# Patient Record
Sex: Male | Born: 1935
Health system: Southern US, Community
[De-identification: ages and names within clinical notes are randomized; demographics above are authoritative.]

## PROBLEM LIST (undated history)

## (undated) DIAGNOSIS — E119 Type 2 diabetes mellitus without complications: Secondary | ICD-10-CM

## (undated) DIAGNOSIS — F419 Anxiety disorder, unspecified: Secondary | ICD-10-CM

## (undated) DIAGNOSIS — E041 Nontoxic single thyroid nodule: Secondary | ICD-10-CM

## (undated) DIAGNOSIS — K5730 Diverticulosis of large intestine without perforation or abscess without bleeding: Secondary | ICD-10-CM

## (undated) DIAGNOSIS — G4733 Obstructive sleep apnea (adult) (pediatric): Secondary | ICD-10-CM

## (undated) DIAGNOSIS — I10 Essential (primary) hypertension: Secondary | ICD-10-CM

## (undated) DIAGNOSIS — M1990 Unspecified osteoarthritis, unspecified site: Secondary | ICD-10-CM

## (undated) DIAGNOSIS — E785 Hyperlipidemia, unspecified: Secondary | ICD-10-CM

## (undated) DIAGNOSIS — B159 Hepatitis A without hepatic coma: Secondary | ICD-10-CM

## (undated) DIAGNOSIS — R55 Syncope and collapse: Secondary | ICD-10-CM

## (undated) DIAGNOSIS — K8020 Calculus of gallbladder without cholecystitis without obstruction: Secondary | ICD-10-CM

## (undated) DIAGNOSIS — D649 Anemia, unspecified: Secondary | ICD-10-CM

## (undated) DIAGNOSIS — Z86010 Personal history of colonic polyps: Secondary | ICD-10-CM

## (undated) DIAGNOSIS — D696 Thrombocytopenia, unspecified: Secondary | ICD-10-CM

## (undated) DIAGNOSIS — K222 Esophageal obstruction: Secondary | ICD-10-CM

## (undated) DIAGNOSIS — K219 Gastro-esophageal reflux disease without esophagitis: Secondary | ICD-10-CM

## (undated) DIAGNOSIS — H269 Unspecified cataract: Secondary | ICD-10-CM

## (undated) DIAGNOSIS — H409 Unspecified glaucoma: Secondary | ICD-10-CM

## (undated) DIAGNOSIS — T17908A Unspecified foreign body in respiratory tract, part unspecified causing other injury, initial encounter: Secondary | ICD-10-CM

## (undated) DIAGNOSIS — I498 Other specified cardiac arrhythmias: Secondary | ICD-10-CM

## (undated) DIAGNOSIS — N259 Disorder resulting from impaired renal tubular function, unspecified: Secondary | ICD-10-CM

## (undated) DIAGNOSIS — F329 Major depressive disorder, single episode, unspecified: Secondary | ICD-10-CM

## (undated) DIAGNOSIS — C61 Malignant neoplasm of prostate: Secondary | ICD-10-CM

## (undated) DIAGNOSIS — R413 Other amnesia: Secondary | ICD-10-CM

## (undated) HISTORY — DX: Gastro-esophageal reflux disease without esophagitis: K21.9

## (undated) HISTORY — DX: Type 2 diabetes mellitus without complications: E11.9

## (undated) HISTORY — DX: Anxiety disorder, unspecified: F41.9

## (undated) HISTORY — DX: Personal history of colonic polyps: Z86.010

## (undated) HISTORY — DX: Hepatitis a without hepatic coma: B15.9

## (undated) HISTORY — DX: Unspecified cataract: H26.9

## (undated) HISTORY — DX: Anemia, unspecified: D64.9

## (undated) HISTORY — DX: Disorder resulting from impaired renal tubular function, unspecified: N25.9

## (undated) HISTORY — DX: Malignant neoplasm of prostate: C61

## (undated) HISTORY — DX: Major depressive disorder, single episode, unspecified: F32.9

## (undated) HISTORY — DX: Essential (primary) hypertension: I10

## (undated) HISTORY — DX: Obstructive sleep apnea (adult) (pediatric): G47.33

## (undated) HISTORY — DX: Thrombocytopenia, unspecified: D69.6

## (undated) HISTORY — DX: Other specified cardiac arrhythmias: I49.8

## (undated) HISTORY — DX: Esophageal obstruction: K22.2

## (undated) HISTORY — DX: Hyperlipidemia, unspecified: E78.5

## (undated) HISTORY — DX: Syncope and collapse: R55

## (undated) HISTORY — DX: Unspecified foreign body in respiratory tract, part unspecified causing other injury, initial encounter: T17.908A

## (undated) HISTORY — DX: Unspecified osteoarthritis, unspecified site: M19.90

## (undated) HISTORY — DX: Calculus of gallbladder without cholecystitis without obstruction: K80.20

## (undated) HISTORY — DX: Other amnesia: R41.3

## (undated) HISTORY — DX: Unspecified glaucoma: H40.9

## (undated) HISTORY — DX: Nontoxic single thyroid nodule: E04.1

## (undated) HISTORY — DX: Diverticulosis of large intestine without perforation or abscess without bleeding: K57.30

---
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
## 1961-06-28 DIAGNOSIS — B159 Hepatitis A without hepatic coma: Secondary | ICD-10-CM

## 1961-06-28 HISTORY — DX: Hepatitis a without hepatic coma: B15.9

## 1995-06-29 DIAGNOSIS — C61 Malignant neoplasm of prostate: Secondary | ICD-10-CM

## 1995-06-29 HISTORY — DX: Malignant neoplasm of prostate: C61

## 1995-06-29 HISTORY — PX: PROSTATECTOMY: SHX69

## 1998-06-28 HISTORY — PX: OTHER SURGICAL HISTORY: SHX169

## 2004-05-07 ENCOUNTER — Ambulatory Visit: Payer: Self-pay | Admitting: Internal Medicine

## 2004-11-06 ENCOUNTER — Ambulatory Visit: Payer: Self-pay | Admitting: Endocrinology

## 2004-11-13 ENCOUNTER — Ambulatory Visit: Payer: Self-pay | Admitting: Endocrinology

## 2004-11-24 ENCOUNTER — Ambulatory Visit (HOSPITAL_COMMUNITY): Admission: RE | Admit: 2004-11-24 | Discharge: 2004-11-24 | Payer: Self-pay | Admitting: Endocrinology

## 2005-03-26 ENCOUNTER — Ambulatory Visit: Payer: Self-pay | Admitting: Endocrinology

## 2005-04-01 ENCOUNTER — Ambulatory Visit: Payer: Self-pay | Admitting: Endocrinology

## 2005-04-05 ENCOUNTER — Ambulatory Visit: Payer: Self-pay

## 2005-04-06 ENCOUNTER — Ambulatory Visit: Payer: Self-pay | Admitting: *Deleted

## 2005-04-21 ENCOUNTER — Ambulatory Visit: Payer: Self-pay

## 2005-11-01 ENCOUNTER — Ambulatory Visit: Payer: Self-pay | Admitting: Endocrinology

## 2005-11-02 ENCOUNTER — Ambulatory Visit: Payer: Self-pay | Admitting: *Deleted

## 2005-11-08 ENCOUNTER — Ambulatory Visit: Payer: Self-pay | Admitting: Endocrinology

## 2006-04-05 ENCOUNTER — Ambulatory Visit: Payer: Self-pay | Admitting: Endocrinology

## 2006-04-05 LAB — CONVERTED CEMR LAB
Chloride: 110 meq/L (ref 96–112)
Cholesterol: 84 mg/dL (ref 0–200)
Creatinine, Ser: 1.6 mg/dL — ABNORMAL HIGH (ref 0.4–1.5)
GFR calc non Af Amer: 45 mL/min
Glomerular Filtration Rate, Af Am: 54 mL/min/{1.73_m2}
LDL Cholesterol: 42 mg/dL (ref 0–99)
MCHC: 33.4 g/dL (ref 30.0–36.0)
MCV: 97.4 fL (ref 78.0–100.0)
Sodium: 144 meq/L (ref 135–145)
Triglyceride fasting, serum: 48 mg/dL (ref 0–149)
WBC: 7 10*3/uL (ref 4.5–10.5)

## 2006-04-12 ENCOUNTER — Ambulatory Visit: Payer: Self-pay | Admitting: Endocrinology

## 2006-04-27 ENCOUNTER — Ambulatory Visit: Payer: Self-pay | Admitting: *Deleted

## 2006-11-08 ENCOUNTER — Ambulatory Visit: Payer: Self-pay | Admitting: Endocrinology

## 2006-11-08 LAB — CONVERTED CEMR LAB
AST: 34 units/L (ref 0–37)
BUN: 27 mg/dL — ABNORMAL HIGH (ref 6–23)
Basophils Absolute: 0 10*3/uL (ref 0.0–0.1)
Basophils Relative: 0.1 % (ref 0.0–1.0)
Calcium: 9.2 mg/dL (ref 8.4–10.5)
Cholesterol: 110 mg/dL (ref 0–200)
Creatinine,U: 106.5 mg/dL
Eosinophils Relative: 3.9 % (ref 0.0–5.0)
Hemoglobin: 13.2 g/dL (ref 13.0–17.0)
LDL Cholesterol: 61 mg/dL (ref 0–99)
Lymphocytes Relative: 23.8 % (ref 12.0–46.0)
MCHC: 35 g/dL (ref 30.0–36.0)
Microalb Creat Ratio: 44.1 mg/g — ABNORMAL HIGH (ref 0.0–30.0)
Microalb, Ur: 4.7 mg/dL — ABNORMAL HIGH (ref 0.0–1.9)
Monocytes Relative: 14.8 % — ABNORMAL HIGH (ref 3.0–11.0)
RDW: 13.6 % (ref 11.5–14.6)
Sodium: 144 meq/L (ref 135–145)
Total Bilirubin: 1 mg/dL (ref 0.3–1.2)
Total CHOL/HDL Ratio: 3.2
Total Protein: 6 g/dL (ref 6.0–8.3)
VLDL: 14 mg/dL (ref 0–40)

## 2006-11-15 ENCOUNTER — Ambulatory Visit: Payer: Self-pay | Admitting: Endocrinology

## 2006-11-24 ENCOUNTER — Ambulatory Visit: Payer: Self-pay | Admitting: *Deleted

## 2007-02-04 ENCOUNTER — Encounter: Payer: Self-pay | Admitting: Endocrinology

## 2007-02-04 DIAGNOSIS — Z860100 Personal history of colon polyps, unspecified: Secondary | ICD-10-CM | POA: Insufficient documentation

## 2007-02-04 DIAGNOSIS — M1990 Unspecified osteoarthritis, unspecified site: Secondary | ICD-10-CM

## 2007-02-04 DIAGNOSIS — I10 Essential (primary) hypertension: Secondary | ICD-10-CM

## 2007-02-04 DIAGNOSIS — K5730 Diverticulosis of large intestine without perforation or abscess without bleeding: Secondary | ICD-10-CM

## 2007-02-04 DIAGNOSIS — E119 Type 2 diabetes mellitus without complications: Secondary | ICD-10-CM

## 2007-02-04 DIAGNOSIS — F329 Major depressive disorder, single episode, unspecified: Secondary | ICD-10-CM

## 2007-02-04 DIAGNOSIS — F3289 Other specified depressive episodes: Secondary | ICD-10-CM

## 2007-02-04 HISTORY — DX: Other specified depressive episodes: F32.89

## 2007-02-04 HISTORY — DX: Major depressive disorder, single episode, unspecified: F32.9

## 2007-02-04 HISTORY — DX: Unspecified osteoarthritis, unspecified site: M19.90

## 2007-02-04 HISTORY — DX: Diverticulosis of large intestine without perforation or abscess without bleeding: K57.30

## 2007-02-04 HISTORY — DX: Essential (primary) hypertension: I10

## 2007-02-04 HISTORY — DX: Personal history of colon polyps, unspecified: Z86.0100

## 2007-02-04 HISTORY — DX: Personal history of colonic polyps: Z86.010

## 2007-02-04 HISTORY — DX: Type 2 diabetes mellitus without complications: E11.9

## 2007-03-31 ENCOUNTER — Ambulatory Visit: Payer: Self-pay | Admitting: Cardiovascular Disease

## 2007-04-10 ENCOUNTER — Ambulatory Visit: Payer: Self-pay | Admitting: Endocrinology

## 2007-04-10 LAB — CONVERTED CEMR LAB
Bilirubin, Direct: 0.2 mg/dL (ref 0.0–0.3)
Hgb A1c MFr Bld: 5.8 % (ref 4.6–6.0)
LDL Cholesterol: 61 mg/dL (ref 0–99)
Total Bilirubin: 0.9 mg/dL (ref 0.3–1.2)
Triglycerides: 91 mg/dL (ref 0–149)
VLDL: 18 mg/dL (ref 0–40)

## 2007-04-19 ENCOUNTER — Ambulatory Visit: Payer: Self-pay | Admitting: Endocrinology

## 2007-06-02 ENCOUNTER — Telehealth: Payer: Self-pay | Admitting: Internal Medicine

## 2007-06-26 ENCOUNTER — Encounter: Payer: Self-pay | Admitting: Endocrinology

## 2007-10-13 ENCOUNTER — Telehealth: Payer: Self-pay | Admitting: Endocrinology

## 2007-10-16 ENCOUNTER — Encounter: Payer: Self-pay | Admitting: Endocrinology

## 2007-10-17 ENCOUNTER — Ambulatory Visit: Payer: Self-pay | Admitting: Endocrinology

## 2007-10-20 ENCOUNTER — Encounter: Payer: Self-pay | Admitting: Endocrinology

## 2007-11-02 ENCOUNTER — Ambulatory Visit: Payer: Self-pay | Admitting: Endocrinology

## 2007-11-02 LAB — CONVERTED CEMR LAB
BUN: 32 mg/dL — ABNORMAL HIGH (ref 6–23)
CO2: 30 meq/L (ref 19–32)
Calcium: 9 mg/dL (ref 8.4–10.5)
Chloride: 108 meq/L (ref 96–112)
Creatinine, Ser: 1.4 mg/dL (ref 0.4–1.5)
Eosinophils Absolute: 0.2 10*3/uL (ref 0.0–0.7)
Glucose, Bld: 87 mg/dL (ref 70–99)
Hemoglobin: 12.7 g/dL — ABNORMAL LOW (ref 13.0–17.0)
Lymphocytes Relative: 25.9 % (ref 12.0–46.0)
MCV: 98.1 fL (ref 78.0–100.0)
Monocytes Absolute: 1.2 10*3/uL — ABNORMAL HIGH (ref 0.1–1.0)
Monocytes Relative: 15.5 % — ABNORMAL HIGH (ref 3.0–12.0)
Neutro Abs: 4.5 10*3/uL (ref 1.4–7.7)
RBC: 3.79 M/uL — ABNORMAL LOW (ref 4.22–5.81)
Sodium: 143 meq/L (ref 135–145)

## 2007-11-07 ENCOUNTER — Ambulatory Visit: Payer: Self-pay | Admitting: Endocrinology

## 2007-11-07 DIAGNOSIS — D649 Anemia, unspecified: Secondary | ICD-10-CM

## 2007-11-07 DIAGNOSIS — C61 Malignant neoplasm of prostate: Secondary | ICD-10-CM | POA: Insufficient documentation

## 2007-11-07 HISTORY — DX: Anemia, unspecified: D64.9

## 2007-11-07 LAB — CONVERTED CEMR LAB
Folate: 10.5 ng/mL
Iron: 76 ug/dL (ref 42–165)
Saturation Ratios: 21.7 % (ref 20.0–50.0)
Transferrin: 250.5 mg/dL (ref >=212.0)
Vitamin B-12: 389 pg/mL (ref 211–911)

## 2007-12-04 ENCOUNTER — Ambulatory Visit: Payer: Self-pay | Admitting: Internal Medicine

## 2007-12-04 DIAGNOSIS — E785 Hyperlipidemia, unspecified: Secondary | ICD-10-CM

## 2007-12-04 DIAGNOSIS — T17908A Unspecified foreign body in respiratory tract, part unspecified causing other injury, initial encounter: Secondary | ICD-10-CM | POA: Insufficient documentation

## 2007-12-04 HISTORY — DX: Unspecified foreign body in respiratory tract, part unspecified causing other injury, initial encounter: T17.908A

## 2007-12-04 HISTORY — DX: Hyperlipidemia, unspecified: E78.5

## 2008-02-23 ENCOUNTER — Ambulatory Visit: Payer: Self-pay | Admitting: Cardiovascular Disease

## 2008-03-11 ENCOUNTER — Telehealth: Payer: Self-pay | Admitting: Endocrinology

## 2008-03-18 ENCOUNTER — Ambulatory Visit: Payer: Self-pay | Admitting: Internal Medicine

## 2008-03-27 ENCOUNTER — Ambulatory Visit: Payer: Self-pay | Admitting: Endocrinology

## 2008-04-02 ENCOUNTER — Ambulatory Visit: Payer: Self-pay | Admitting: Internal Medicine

## 2008-04-08 ENCOUNTER — Ambulatory Visit: Payer: Self-pay | Admitting: Endocrinology

## 2008-04-08 LAB — CONVERTED CEMR LAB
Basophils Absolute: 0 10*3/uL (ref 0.0–0.1)
Eosinophils Relative: 3.7 % (ref 0.0–5.0)
Hgb A1c MFr Bld: 5.7 % (ref 4.6–6.0)
Lymphocytes Relative: 29 % (ref 12.0–46.0)
MCV: 96.7 fL (ref 78.0–100.0)
Monocytes Absolute: 1.3 10*3/uL — ABNORMAL HIGH (ref 0.1–1.0)
Monocytes Relative: 16.8 % — ABNORMAL HIGH (ref 3.0–12.0)
Neutrophils Relative %: 50.4 % (ref 43.0–77.0)
Platelets: 97 10*3/uL — ABNORMAL LOW (ref 150–400)
WBC: 8 10*3/uL (ref 4.5–10.5)

## 2008-04-17 ENCOUNTER — Ambulatory Visit: Payer: Self-pay | Admitting: Endocrinology

## 2008-04-17 DIAGNOSIS — D696 Thrombocytopenia, unspecified: Secondary | ICD-10-CM

## 2008-04-17 HISTORY — DX: Thrombocytopenia, unspecified: D69.6

## 2008-04-22 ENCOUNTER — Ambulatory Visit: Payer: Self-pay | Admitting: Hematology

## 2008-04-25 ENCOUNTER — Encounter: Payer: Self-pay | Admitting: Endocrinology

## 2008-04-25 LAB — CBC WITH DIFFERENTIAL/PLATELET
BASO%: 0.2 % (ref 0.0–2.0)
Eosinophils Absolute: 0.2 10*3/uL (ref 0.0–0.5)
HCT: 39 % (ref 38.7–49.9)
LYMPH%: 19 % (ref 14.0–48.0)
MCHC: 34.4 g/dL (ref 32.0–35.9)
MONO#: 1 10*3/uL — ABNORMAL HIGH (ref 0.1–0.9)
NEUT#: 5.6 10*3/uL (ref 1.5–6.5)
NEUT%: 66.2 % (ref 40.0–75.0)
Platelets: 150 10*3/uL (ref 145–400)
WBC: 8.5 10*3/uL (ref 4.0–10.0)
lymph#: 1.6 10*3/uL (ref 0.9–3.3)

## 2008-04-25 LAB — TECHNOLOGIST REVIEW

## 2008-04-29 LAB — SPEP & IFE WITH QIG
Beta 2: 3.9 % (ref 3.2–6.5)
Beta Globulin: 6.8 % (ref 4.7–7.2)
IgA: 89 mg/dL (ref 68–378)
IgG (Immunoglobin G), Serum: 772 mg/dL (ref 694–1618)

## 2008-04-29 LAB — COMPREHENSIVE METABOLIC PANEL
ALT: 19 U/L (ref 0–53)
AST: 31 U/L (ref 0–37)
Albumin: 4.4 g/dL (ref 3.5–5.2)
BUN: 41 mg/dL — ABNORMAL HIGH (ref 6–23)
CO2: 22 mEq/L (ref 19–32)
Calcium: 9.3 mg/dL (ref 8.4–10.5)
Chloride: 107 mEq/L (ref 96–112)
Creatinine, Ser: 1.67 mg/dL — ABNORMAL HIGH (ref 0.40–1.50)
Potassium: 4.3 mEq/L (ref 3.5–5.3)

## 2008-04-29 LAB — KAPPA/LAMBDA LIGHT CHAINS: Kappa free light chain: 0.82 mg/dL (ref 0.33–1.94)

## 2008-05-14 ENCOUNTER — Telehealth (INDEPENDENT_AMBULATORY_CARE_PROVIDER_SITE_OTHER): Payer: Self-pay | Admitting: *Deleted

## 2008-06-11 ENCOUNTER — Telehealth: Payer: Self-pay | Admitting: Endocrinology

## 2008-09-04 ENCOUNTER — Telehealth (INDEPENDENT_AMBULATORY_CARE_PROVIDER_SITE_OTHER): Payer: Self-pay | Admitting: *Deleted

## 2008-10-10 ENCOUNTER — Encounter: Payer: Self-pay | Admitting: Endocrinology

## 2008-11-04 ENCOUNTER — Ambulatory Visit: Payer: Self-pay | Admitting: Oncology

## 2008-11-05 ENCOUNTER — Ambulatory Visit: Payer: Self-pay | Admitting: Internal Medicine

## 2008-11-05 LAB — CONVERTED CEMR LAB
BUN: 40 mg/dL — ABNORMAL HIGH (ref 6–23)
Basophils Absolute: 0 10*3/uL (ref 0.0–0.1)
Chloride: 108 meq/L (ref 96–112)
Cholesterol: 128 mg/dL (ref 0–200)
Creatinine, Ser: 1.5 mg/dL (ref 0.4–1.5)
Eosinophils Absolute: 0.4 10*3/uL (ref 0.0–0.7)
GFR calc non Af Amer: 47.97 mL/min (ref >60)
Glucose, Bld: 82 mg/dL (ref 70–99)
HCT: 38.4 % — ABNORMAL LOW (ref 39.0–52.0)
HDL: 36 mg/dL — ABNORMAL LOW (ref >39.00)
Hgb A1c MFr Bld: 5.9 % (ref 4.6–6.5)
LDL Cholesterol: 73 mg/dL (ref 0–99)
Lymphs Abs: 2.2 10*3/uL (ref 0.7–4.0)
MCHC: 34.5 g/dL (ref 30.0–36.0)
Microalb Creat Ratio: 62.6 mg/g — ABNORMAL HIGH (ref 0.0–30.0)
Microalb, Ur: 6.7 mg/dL — ABNORMAL HIGH (ref 0.0–1.9)
Neutro Abs: 5 10*3/uL (ref 1.4–7.7)
Nitrite: NEGATIVE
Platelets: 110 10*3/uL — ABNORMAL LOW (ref 150.0–400.0)
RBC: 3.95 M/uL — ABNORMAL LOW (ref 4.22–5.81)
RDW: 13.2 % (ref 11.5–14.6)
Total Protein, Urine: NEGATIVE mg/dL
Triglycerides: 95 mg/dL (ref 0.0–149.0)
VLDL: 19 mg/dL (ref 0.0–40.0)
WBC: 8.8 10*3/uL (ref 4.5–10.5)

## 2008-11-06 ENCOUNTER — Encounter: Payer: Self-pay | Admitting: Endocrinology

## 2008-11-06 LAB — CBC WITH DIFFERENTIAL/PLATELET
Basophils Absolute: 0 10*3/uL (ref 0.0–0.1)
Eosinophils Absolute: 0.3 10*3/uL (ref 0.0–0.5)
HGB: 13 g/dL (ref 13.0–17.1)
LYMPH%: 25.5 % (ref 14.0–49.0)
MCH: 33.3 pg (ref 27.2–33.4)
MCV: 97.2 fL (ref 79.3–98.0)
MONO%: 14 % (ref 0.0–14.0)
NEUT#: 4.2 10*3/uL (ref 1.5–6.5)
NEUT%: 56.6 % (ref 39.0–75.0)
Platelets: 120 10*3/uL — ABNORMAL LOW (ref 140–400)

## 2008-11-06 LAB — COMPREHENSIVE METABOLIC PANEL
ALT: 17 U/L (ref 0–53)
BUN: 35 mg/dL — ABNORMAL HIGH (ref 6–23)
CO2: 24 mEq/L (ref 19–32)
Calcium: 9.2 mg/dL (ref 8.4–10.5)
Chloride: 107 mEq/L (ref 96–112)
Creatinine, Ser: 1.57 mg/dL — ABNORMAL HIGH (ref 0.40–1.50)
Glucose, Bld: 124 mg/dL — ABNORMAL HIGH (ref 70–99)
Total Bilirubin: 0.6 mg/dL (ref 0.3–1.2)

## 2008-11-06 LAB — CHCC SMEAR

## 2008-11-06 LAB — MORPHOLOGY

## 2008-11-13 ENCOUNTER — Ambulatory Visit: Payer: Self-pay | Admitting: Endocrinology

## 2008-11-13 DIAGNOSIS — E041 Nontoxic single thyroid nodule: Secondary | ICD-10-CM

## 2008-11-13 DIAGNOSIS — I498 Other specified cardiac arrhythmias: Secondary | ICD-10-CM

## 2008-11-13 DIAGNOSIS — R001 Bradycardia, unspecified: Secondary | ICD-10-CM | POA: Insufficient documentation

## 2008-11-13 DIAGNOSIS — N259 Disorder resulting from impaired renal tubular function, unspecified: Secondary | ICD-10-CM | POA: Insufficient documentation

## 2008-11-13 HISTORY — DX: Disorder resulting from impaired renal tubular function, unspecified: N25.9

## 2008-11-13 HISTORY — DX: Other specified cardiac arrhythmias: I49.8

## 2008-11-13 HISTORY — DX: Nontoxic single thyroid nodule: E04.1

## 2008-12-10 ENCOUNTER — Encounter (INDEPENDENT_AMBULATORY_CARE_PROVIDER_SITE_OTHER): Payer: Self-pay | Admitting: Diagnostic Radiology

## 2008-12-10 ENCOUNTER — Encounter: Payer: Self-pay | Admitting: Endocrinology

## 2008-12-10 ENCOUNTER — Other Ambulatory Visit: Admission: RE | Admit: 2008-12-10 | Discharge: 2008-12-10 | Payer: Self-pay | Admitting: Diagnostic Radiology

## 2008-12-10 ENCOUNTER — Encounter: Admission: RE | Admit: 2008-12-10 | Discharge: 2008-12-10 | Payer: Self-pay | Admitting: Endocrinology

## 2008-12-13 ENCOUNTER — Telehealth: Payer: Self-pay | Admitting: Internal Medicine

## 2009-03-08 DIAGNOSIS — H409 Unspecified glaucoma: Secondary | ICD-10-CM | POA: Insufficient documentation

## 2009-03-08 HISTORY — DX: Unspecified glaucoma: H40.9

## 2009-03-24 ENCOUNTER — Ambulatory Visit: Payer: Self-pay | Admitting: Cardiovascular Disease

## 2009-04-08 ENCOUNTER — Ambulatory Visit: Payer: Self-pay | Admitting: Oncology

## 2009-04-08 ENCOUNTER — Ambulatory Visit: Payer: Self-pay | Admitting: Endocrinology

## 2009-04-09 LAB — CONVERTED CEMR LAB
Eosinophils Absolute: 0.3 10*3/uL (ref 0.0–0.7)
Eosinophils Relative: 3.6 % (ref 0.0–5.0)
HCT: 40.5 % (ref 39.0–52.0)
Hemoglobin: 13.8 g/dL (ref 13.0–17.0)
MCHC: 33.9 g/dL (ref 30.0–36.0)
Monocytes Relative: 16 % — ABNORMAL HIGH (ref 3.0–12.0)
Neutro Abs: 4.5 10*3/uL (ref 1.4–7.7)
TSH: 3.64 microintl units/mL (ref 0.35–5.50)

## 2009-04-10 ENCOUNTER — Encounter: Payer: Self-pay | Admitting: Endocrinology

## 2009-04-10 LAB — CBC WITH DIFFERENTIAL/PLATELET
Basophils Absolute: 0 10*3/uL (ref 0.0–0.1)
HCT: 39.8 % (ref 38.4–49.9)
HGB: 13.5 g/dL (ref 13.0–17.1)
LYMPH%: 24.8 % (ref 14.0–49.0)
MCH: 32.9 pg (ref 27.2–33.4)
MONO#: 0.9 10*3/uL (ref 0.1–0.9)
NEUT%: 62.1 % (ref 39.0–75.0)
Platelets: 108 10*3/uL — ABNORMAL LOW (ref 140–400)
WBC: 9.1 10*3/uL (ref 4.0–10.3)
lymph#: 2.3 10*3/uL (ref 0.9–3.3)

## 2009-04-15 ENCOUNTER — Ambulatory Visit: Payer: Self-pay | Admitting: Endocrinology

## 2009-11-03 ENCOUNTER — Ambulatory Visit: Payer: Self-pay | Admitting: Endocrinology

## 2009-11-03 LAB — CONVERTED CEMR LAB
ALT: 22 units/L (ref 0–53)
AST: 34 units/L (ref 0–37)
Albumin: 4 g/dL (ref 3.5–5.2)
Bilirubin Urine: NEGATIVE
Bilirubin, Direct: 0.1 mg/dL (ref 0.0–0.3)
Cholesterol: 137 mg/dL (ref 0–200)
Eosinophils Relative: 2.9 % (ref 0.0–5.0)
HCT: 38.5 % — ABNORMAL LOW (ref 39.0–52.0)
Hemoglobin, Urine: NEGATIVE
Ketones, ur: NEGATIVE mg/dL
Leukocytes, UA: NEGATIVE
MCHC: 34.6 g/dL (ref 30.0–36.0)
MCV: 97.5 fL (ref 78.0–100.0)
Microalb Creat Ratio: 7.5 mg/g (ref 0.0–30.0)
Monocytes Relative: 13 % — ABNORMAL HIGH (ref 3.0–12.0)
Neutro Abs: 5.2 10*3/uL (ref 1.4–7.7)
Neutrophils Relative %: 57 % (ref 43.0–77.0)
Platelets: 112 10*3/uL — ABNORMAL LOW (ref 150.0–400.0)
RBC: 3.94 M/uL — ABNORMAL LOW (ref 4.22–5.81)
RDW: 14.3 % (ref 11.5–14.6)
Specific Gravity, Urine: 1.02 (ref 1.000–1.030)
Total Bilirubin: 0.6 mg/dL (ref 0.3–1.2)
Total CHOL/HDL Ratio: 3
Total Protein: 6.4 g/dL (ref 6.0–8.3)
VLDL: 22.2 mg/dL (ref 0.0–40.0)
WBC: 9.1 10*3/uL (ref 4.5–10.5)
pH: 5.5 (ref 5.0–8.0)

## 2009-11-05 ENCOUNTER — Ambulatory Visit: Payer: Self-pay | Admitting: Endocrinology

## 2009-11-05 DIAGNOSIS — G4733 Obstructive sleep apnea (adult) (pediatric): Secondary | ICD-10-CM

## 2009-11-05 HISTORY — DX: Obstructive sleep apnea (adult) (pediatric): G47.33

## 2009-11-10 ENCOUNTER — Ambulatory Visit: Payer: Self-pay | Admitting: Oncology

## 2009-11-12 ENCOUNTER — Encounter: Payer: Self-pay | Admitting: Endocrinology

## 2009-11-12 LAB — CBC WITH DIFFERENTIAL/PLATELET
BASO%: 0.3 % (ref 0.0–2.0)
HCT: 39.5 % (ref 38.4–49.9)
HGB: 13.6 g/dL (ref 13.0–17.1)
LYMPH%: 28.5 % (ref 14.0–49.0)
MCV: 96.9 fL (ref 79.3–98.0)
MONO#: 1.3 10*3/uL — ABNORMAL HIGH (ref 0.1–0.9)
MONO%: 13.3 % (ref 0.0–14.0)
NEUT#: 5.1 10*3/uL (ref 1.5–6.5)
NEUT%: 54.3 % (ref 39.0–75.0)
RDW: 13.9 % (ref 11.0–14.6)

## 2009-11-12 LAB — COMPREHENSIVE METABOLIC PANEL
AST: 37 U/L (ref 0–37)
Alkaline Phosphatase: 58 U/L (ref 39–117)
CO2: 22 mEq/L (ref 19–32)
Calcium: 9.3 mg/dL (ref 8.4–10.5)
Chloride: 106 mEq/L (ref 96–112)
Glucose, Bld: 92 mg/dL (ref 70–99)
Potassium: 5.1 mEq/L (ref 3.5–5.3)
Total Protein: 6.4 g/dL (ref 6.0–8.3)

## 2009-11-21 ENCOUNTER — Ambulatory Visit: Payer: Self-pay | Admitting: Pulmonary Disease

## 2009-11-21 ENCOUNTER — Telehealth: Payer: Self-pay | Admitting: Endocrinology

## 2009-11-25 ENCOUNTER — Encounter: Admission: RE | Admit: 2009-11-25 | Discharge: 2009-11-25 | Payer: Self-pay | Admitting: Endocrinology

## 2009-12-31 ENCOUNTER — Telehealth (INDEPENDENT_AMBULATORY_CARE_PROVIDER_SITE_OTHER): Payer: Self-pay | Admitting: *Deleted

## 2009-12-31 ENCOUNTER — Ambulatory Visit: Payer: Self-pay | Admitting: Endocrinology

## 2009-12-31 ENCOUNTER — Telehealth: Payer: Self-pay | Admitting: Endocrinology

## 2009-12-31 ENCOUNTER — Telehealth: Payer: Self-pay | Admitting: Cardiovascular Disease

## 2009-12-31 DIAGNOSIS — R55 Syncope and collapse: Secondary | ICD-10-CM

## 2009-12-31 DIAGNOSIS — R0989 Other specified symptoms and signs involving the circulatory and respiratory systems: Secondary | ICD-10-CM

## 2009-12-31 HISTORY — DX: Syncope and collapse: R55

## 2010-01-01 ENCOUNTER — Ambulatory Visit: Payer: Self-pay

## 2010-01-01 ENCOUNTER — Encounter: Payer: Self-pay | Admitting: Endocrinology

## 2010-01-01 ENCOUNTER — Encounter: Payer: Self-pay | Admitting: Cardiology

## 2010-01-05 ENCOUNTER — Encounter: Payer: Self-pay | Admitting: Pulmonary Disease

## 2010-01-05 ENCOUNTER — Ambulatory Visit (HOSPITAL_BASED_OUTPATIENT_CLINIC_OR_DEPARTMENT_OTHER): Admission: RE | Admit: 2010-01-05 | Discharge: 2010-01-05 | Payer: Self-pay | Admitting: Pulmonary Disease

## 2010-01-09 ENCOUNTER — Telehealth: Payer: Self-pay | Admitting: Endocrinology

## 2010-01-09 ENCOUNTER — Ambulatory Visit: Payer: Self-pay | Admitting: Pulmonary Disease

## 2010-01-09 ENCOUNTER — Ambulatory Visit: Payer: Self-pay

## 2010-01-09 ENCOUNTER — Encounter: Payer: Self-pay | Admitting: Endocrinology

## 2010-01-26 ENCOUNTER — Ambulatory Visit: Payer: Self-pay | Admitting: Cardiovascular Disease

## 2010-02-17 ENCOUNTER — Ambulatory Visit: Payer: Self-pay | Admitting: Pulmonary Disease

## 2010-02-17 DIAGNOSIS — R252 Cramp and spasm: Secondary | ICD-10-CM | POA: Insufficient documentation

## 2010-02-26 ENCOUNTER — Encounter: Admission: RE | Admit: 2010-02-26 | Discharge: 2010-02-26 | Payer: Self-pay | Admitting: Sports Medicine

## 2010-03-05 ENCOUNTER — Ambulatory Visit: Payer: Self-pay | Admitting: Sports Medicine

## 2010-03-05 DIAGNOSIS — M169 Osteoarthritis of hip, unspecified: Secondary | ICD-10-CM

## 2010-03-05 DIAGNOSIS — M2170 Unequal limb length (acquired), unspecified site: Secondary | ICD-10-CM

## 2010-03-05 DIAGNOSIS — M25559 Pain in unspecified hip: Secondary | ICD-10-CM | POA: Insufficient documentation

## 2010-03-19 ENCOUNTER — Ambulatory Visit: Payer: Self-pay | Admitting: Sports Medicine

## 2010-03-20 ENCOUNTER — Ambulatory Visit: Payer: Self-pay | Admitting: Endocrinology

## 2010-03-31 ENCOUNTER — Ambulatory Visit: Payer: Self-pay | Admitting: Endocrinology

## 2010-03-31 LAB — CONVERTED CEMR LAB
Basophils Relative: 0.2 % (ref 0.0–3.0)
Glucose, Bld: 84 mg/dL (ref 70–99)
HCT: 33.7 % — ABNORMAL LOW (ref 39.0–52.0)
Hgb A1c MFr Bld: 6.3 % (ref 4.6–6.5)
Lymphs Abs: 2.5 10*3/uL (ref 0.7–4.0)
MCHC: 34.5 g/dL (ref 30.0–36.0)
Monocytes Absolute: 1.3 10*3/uL — ABNORMAL HIGH (ref 0.1–1.0)
Monocytes Relative: 14.3 % — ABNORMAL HIGH (ref 3.0–12.0)
Neutro Abs: 5.2 10*3/uL (ref 1.4–7.7)
Platelets: 157 10*3/uL (ref 150.0–400.0)
Potassium: 4.8 meq/L (ref 3.5–5.1)
Sodium: 143 meq/L (ref 135–145)
WBC: 9.3 10*3/uL (ref 4.5–10.5)

## 2010-04-06 ENCOUNTER — Ambulatory Visit: Payer: Self-pay | Admitting: Oncology

## 2010-04-06 ENCOUNTER — Ambulatory Visit: Payer: Self-pay | Admitting: Sports Medicine

## 2010-04-06 ENCOUNTER — Telehealth (INDEPENDENT_AMBULATORY_CARE_PROVIDER_SITE_OTHER): Payer: Self-pay | Admitting: *Deleted

## 2010-04-08 ENCOUNTER — Ambulatory Visit: Payer: Self-pay | Admitting: Endocrinology

## 2010-04-08 DIAGNOSIS — R600 Localized edema: Secondary | ICD-10-CM | POA: Insufficient documentation

## 2010-04-08 DIAGNOSIS — R609 Edema, unspecified: Secondary | ICD-10-CM

## 2010-04-08 LAB — CBC WITH DIFFERENTIAL/PLATELET
BASO%: 0.3 % (ref 0.0–2.0)
Basophils Absolute: 0 10*3/uL (ref 0.0–0.1)
EOS%: 3.2 % (ref 0.0–7.0)
HGB: 11.8 g/dL — ABNORMAL LOW (ref 13.0–17.1)
LYMPH%: 23.3 % (ref 14.0–49.0)
MONO#: 1.1 10*3/uL — ABNORMAL HIGH (ref 0.1–0.9)
MONO%: 12.2 % (ref 0.0–14.0)
NEUT#: 5.3 10*3/uL (ref 1.5–6.5)
Platelets: 157 10*3/uL (ref 140–400)
RDW: 13.8 % (ref 11.0–14.6)
WBC: 8.6 10*3/uL (ref 4.0–10.3)
lymph#: 2 10*3/uL (ref 0.9–3.3)

## 2010-04-08 LAB — COMPREHENSIVE METABOLIC PANEL
AST: 24 U/L (ref 0–37)
Albumin: 4.1 g/dL (ref 3.5–5.2)
CO2: 26 mEq/L (ref 19–32)
Chloride: 106 mEq/L (ref 96–112)
Total Protein: 6.7 g/dL (ref 6.0–8.3)

## 2010-04-09 ENCOUNTER — Telehealth: Payer: Self-pay | Admitting: Endocrinology

## 2010-04-10 LAB — KAPPA/LAMBDA LIGHT CHAINS: Kappa free light chain: <0.6 mg/dL (ref 0.33–1.94)

## 2010-04-10 LAB — VITAMIN B12: Vitamin B-12: 623 pg/mL (ref 211–911)

## 2010-04-10 LAB — FOLATE: Folate: 19.4 ng/mL

## 2010-04-10 LAB — HAPTOGLOBIN: Haptoglobin: 209 mg/dL — ABNORMAL HIGH (ref 16–200)

## 2010-04-10 LAB — IRON AND TIBC
%SAT: 20 % (ref 20–55)
Iron: 61 ug/dL (ref 42–165)
UIBC: 238 ug/dL

## 2010-04-10 LAB — PROTEIN ELECTROPHORESIS, SERUM: Total Protein, Serum Electrophoresis: 6.9 g/dL (ref 6.0–8.3)

## 2010-04-10 LAB — ERYTHROPOIETIN: Erythropoietin: 15.1 m[IU]/mL (ref 2.6–34.0)

## 2010-06-17 ENCOUNTER — Encounter: Payer: Self-pay | Admitting: Endocrinology

## 2010-06-17 ENCOUNTER — Telehealth: Payer: Self-pay | Admitting: Endocrinology

## 2010-06-26 ENCOUNTER — Encounter (INDEPENDENT_AMBULATORY_CARE_PROVIDER_SITE_OTHER): Payer: Self-pay | Admitting: *Deleted

## 2010-07-26 LAB — CONVERTED CEMR LAB
BUN: 35 mg/dL — ABNORMAL HIGH (ref 6–23)
Basophils Absolute: 0 10*3/uL (ref 0.0–0.1)
Creatinine, Ser: 1.3 mg/dL (ref 0.4–1.5)
GFR calc non Af Amer: 54.47 mL/min (ref >60)
HCT: 38.4 % — ABNORMAL LOW (ref 39.0–52.0)
Hemoglobin: 13.1 g/dL (ref 13.0–17.0)
Monocytes Relative: 12.7 % — ABNORMAL HIGH (ref 3.0–12.0)
Neutro Abs: 5 10*3/uL (ref 1.4–7.7)
Potassium: 4.3 meq/L (ref 3.5–5.1)
RBC: 3.88 M/uL — ABNORMAL LOW (ref 4.22–5.81)
Sodium: 143 meq/L (ref 135–145)
WBC: 8.5 10*3/uL (ref 4.5–10.5)

## 2010-07-30 NOTE — Miscellaneous (Signed)
Summary: Orders Update  Clinical Lists Changes  Orders: Added new Test order of Carotid Duplex (Carotid Duplex) - Signed  Appended Document: Orders Update please leave message on phone tree--normal  Appended Document: Orders Update left message on phone tree/AJ

## 2010-07-30 NOTE — Miscellaneous (Signed)
Summary: Polysomnogram report  Clinical Lists Changes AHI 3.5, RDI 11, O2 nadir 91%, PLMI 56.  Mild sleep apnea with periodic limb movement syndrome.  Could try positional therapy first.  Will have my nurse call to schedule next available ROV to discuss sleep test results.  Appended Document: Polysomnogram report LMOMTCB.  Appended Document: Polysomnogram report LMOMTCB.  Appended Document: Polysomnogram report LMOMTCB.  Appended Document: Polysomnogram report LMOMTCB.  Appended Document: Polysomnogram report Per Spouse, we will need to contact the patient on Mon., 01/26/2010, in the afternoon, to sch an appt.  Appended Document: Polysomnogram report Patient sch for 02/17/2010 @ 2:45pm. Several appt dates and times were offered to the patient and spouse but this was the first appt date that worked for both of them.

## 2010-07-30 NOTE — Letter (Signed)
Summary: Letter regarding heart cath/Patient  Letter regarding heart cath/Patient   Imported By: Phillis Knack 07/03/2010 08:59:00  _____________________________________________________________________  External Attachment:    Type:   Image     Comment:   External Document

## 2010-07-30 NOTE — Progress Notes (Signed)
Summary: [place]?  Phone Note Call from Patient Call back at Home Phone [phone]   Caller: Patient Summary of Call: pt called stating that he was advised by MD in [place] that he should have had an [place] after having biopsy. Pt is requesting SAE opinion. Please advise Initial call taken by: Crissie Sickles, [place],  Nov 21, 2009 2:31 PM  Follow-up for Phone Call        i spoke to pt.  i ordered ultrasound.  you will be called with a day and time for an appointment Follow-up by: Donavan Foil MD,  Nov 21, 2009 2:46 PM

## 2010-07-30 NOTE — Progress Notes (Signed)
Summary: forms  Phone Note Outgoing Call   Call placed by: [name] MA,  April 09, 2010 12:00 PM Call placed to: Patient Summary of Call: Informed pt that forms are ready for pickup at front desk

## 2010-07-30 NOTE — Progress Notes (Signed)
Summary: Pt?  Phone Note Call from Patient Call back at Home Phone [phone]   Caller: Spouse Summary of Call: Pt's spouse called wanting to know if MD thinks pt should still have sleep study done. He is scheduled to have it done Monday night 07/18. Pt's wife is concerned that this may interfere with his current medical issue that he was senn for today. Please advise? Initial call taken by: Crissie Sickles, [place],  December 31, 2009 10:24 AM  Follow-up for Phone Call        please go ahead and do the sleep study Follow-up by: Donavan Foil MD,  December 31, 2009 10:35 AM  Additional Follow-up for Phone Call Additional follow up Details #1::        pt's spouse informed Additional Follow-up by: Crissie Sickles, [place],  December 31, 2009 10:39 AM

## 2010-07-30 NOTE — Assessment & Plan Note (Signed)
Summary: LOST CONSCIOUSNESS WHILE PLAYING TENNIS./DRINKING FLUID-PER W...   Vital Signs:  Patient profile:   77 year old male Height:      72 inches (182.88 cm) Weight:      209.25 pounds (95.11 kg) BMI:     28.48 O2 Sat:      97 % on Room air Temp:     97.7 degrees F (36.50 degrees C) oral Pulse rate:   64 / minute Pulse rhythm:   regular BP sitting:   110 / 62  (left arm) Cuff size:   regular  Vitals Entered By: [name] MA (December 31, 2009 8:12 AM)  O2 Flow:  Room air CC: pt loss conciousness after drinking water while playing tennis/aj   Referring Provider:  Dr. Loanne Drilling Primary Provider:  Donavan Foil MD  CC:  pt loss conciousness after drinking water while playing tennis/aj.  History of Present Illness: 3 days ago, pt was playing tennis on a hot day.  during a break, he was drinking water when he "choking" sensation in the throat.  he thinks he lost consciousness for a few seconds.  family members caught him before he hit the ground.  he then recovered back to baseline.  no pain in the chest, but he had associated slight sob.   about 4 years ago, he was drinking coffee, and had a similar episode.    Current Medications (verified): 1)  Lisinopril 40 Mg  Tabs (Lisinopril) .... Take 1 By Mouth Qd 2)  Citalopram Hydrobromide 20 Mg  Tabs (Citalopram Hydrobromide) .... Take 1/2 Tab Qod 3)  Crestor 40 Mg  Tabs (Rosuvastatin Calcium) .... Take 1 By Mouth Qd 4)  Actos 45 Mg  Tabs (Pioglitazone Hcl) .... Take 1/2 Tab By Mouth Qd 5)  Vitamin D 2000 Unit Tabs (Cholecalciferol) .Marland Kitchen.. 1 By Mouth Daily 6)  Aspirin 81 Mg Tbec (Aspirin) .... Take One Tablet By Mouth Daily 7)  Cyclobenzaprine Hcl 10 Mg Tabs (Cyclobenzaprine Hcl) .Marland Kitchen.. 1 By Mouth Daily As Needed For Leg Cramps 8)  Vitamin B-1 100 Mg Tabs (Thiamine Hcl) .Marland Kitchen.. 1 By Mouth Daily  Allergies (verified): 1)  ! Sulfa  Past History:  Past Medical History: Last updated: 03/08/2009 Current Problems:  HYPERTENSION  (ICD-401.9) HYPERLIPIDEMIA (ICD-272.4) BRADYCARDIA (ICD-427.89) RENAL INSUFFICIENCY (ICD-588.9) THROMBOCYTOPENIA (ICD-287.5) THYROID NODULE, RIGHT (ICD-241.0) FOREIGN BODY, ASPIRATION (ICD-934.9) PROSTATE CANCER (ICD-185) UNSPECIFIED ANEMIA (ICD-285.9) OSTEOARTHRITIS (ICD-715.90) DIVERTICULOSIS, COLON (ICD-562.10) DIABETES MELLITUS, TYPE II (ICD-250.00) DEPRESSION (ICD-311) COLONIC POLYPS, HX OF (ICD-V12.72) GLAUCOMA (ICD-365.9)  Review of Systems  The patient denies fever.         denies n/v  Physical Exam  General:  normal appearance.   Head:  head: no deformity eyes: no periorbital swelling, no proptosis external nose and ears are normal mouth: no lesion seen Neck:  Supple without thyroid enlargement or tenderness.  Lungs:  Clear to auscultation bilaterally. Normal respiratory effort.  Heart:  Regular rate and rhythm without murmurs or gallops noted. Normal S1,S2.   Abdomen:  abdomen is soft, nontender.  no hepatosplenomegaly.   not distended.  no hernia  Msk:  muscle bulk and strength are grossly normal.  no obvious joint swelling.  gait is normal and steady  Pulses:  there is a left carotid bruit Extremities:  no edema Neurologic:  cn 2-12 grossly intact.   readily moves all 4's.    Skin:  not diaphoretic Psych:  Alert and cooperative; normal mood and affect; normal attention span and concentration.   Additional Exam:  ecg:  hr of 62, occ pac's.   Impression & Recommendations:  Problem # 1:  SYNCOPE (ICD-780.2) Assessment New  Problem # 2:  BRADYCARDIA ([id number]) by hx.  uncertain what the relationship is, if any, to #1  Problem # 3:  CAROTID BRUIT, LEFT (ICD-785.9) Assessment: New  Other Orders: TLB-BMP (Basic Metabolic Panel-BMET) (99991111) TLB-CBC Platelet - w/Differential (85025-CBCD) TLB-TSH (Thyroid Stimulating Hormone) (84443-TSH) EKG w/ Interpretation (93000) Vascular Clinic (Vascular) Cardiology Referral (Cardiology) Est. Patient  Level IV [id number])  Patient Instructions: 1)  blood tests are being ordered for you today.   2)  also check ultrasound of the carotid arteries.  you will be called with a day and time for an appointment 3)  also check "holter."  (a tape recored you carry around for a day, which records you heartbeats).  you will be called with a day and time for an appointment. 4)  please call [phone] to hear each of your test results. 5)  refer back to dr cooper.  i'll call him to discuss.

## 2010-07-30 NOTE — Assessment & Plan Note (Signed)
Summary: syncope   Visit Type:  Follow-up Referring Provider:  Dr. Loanne Drilling Primary Provider:  Donavan Foil MD  CC:  follow up monitor and carotid/ syncope.  History of Present Illness: This is a 77 year old gentleman with hypertension, diabetes, and dyslipidemia, who presents today for followup evaluation. He underwent a cardiac catheterization in 2000, demonstrating no significant coronary artery disease and normal LV function.  He had a syncopal episode last month while playing tennis. The pt reports he took a drink of water during a break, developed coughing, then transiently lost consciousness. His wife, who witnessed the event, states the pt was not fully unconscious but that he was unresponsive for about 60 seconds. He recovered quickly, and felt no ill effects even an hour after the event. He has had no further lightheadedness, syncope, chest pain, dyspnea, or other complaints. He reports a similar episode about 5 years ago that occured with drinking coffee followed by coughing/laughing then syncope.  Current Medications (verified): 1)  Lisinopril 40 Mg  Tabs (Lisinopril) .... Take 1 By Mouth Qd 2)  Citalopram Hydrobromide 20 Mg  Tabs (Citalopram Hydrobromide) .... Take 1/2 Tab Qod 3)  Crestor 40 Mg  Tabs (Rosuvastatin Calcium) .... Take 1 By Mouth Qd 4)  Actos 45 Mg  Tabs (Pioglitazone Hcl) .... Take 1/2 Tab By Mouth Qd 5)  Vitamin D 2000 Unit Tabs (Cholecalciferol) .Marland Kitchen.. 1 By Mouth Daily 6)  Aspirin 81 Mg Tbec (Aspirin) .... Take One Tablet By Mouth Daily 7)  Cyclobenzaprine Hcl 10 Mg Tabs (Cyclobenzaprine Hcl) .Marland Kitchen.. 1 By Mouth Daily As Needed For Leg Cramps 8)  Vitamin B-1 100 Mg Tabs (Thiamine Hcl) .Marland Kitchen.. 1 By Mouth Daily  Allergies: 1)  ! Sulfa  Past History:  Past medical history reviewed for relevance to current acute and chronic problems.  Past Medical History: Current Problems:  HYPERTENSION (ICD-401.9) HYPERLIPIDEMIA (ICD-272.4) BRADYCARDIA (ICD-427.89) RENAL  INSUFFICIENCY (ICD-588.9) THROMBOCYTOPENIA (ICD-287.5) THYROID NODULE, RIGHT (ICD-241.0) FOREIGN BODY, ASPIRATION (ICD-934.9) PROSTATE CANCER (ICD-185) UNSPECIFIED ANEMIA (ICD-285.9) OSTEOARTHRITIS (ICD-715.90) DIVERTICULOSIS, COLON (ICD-562.10) DIABETES MELLITUS, TYPE II (ICD-250.00) DEPRESSION (ICD-311) COLONIC POLYPS, HX OF (ICD-V12.72) GLAUCOMA (ICD-365.9) SYNCOPE 2011  Review of Systems       Negative except as per HPI   Vital Signs:  Patient profile:   77 year old male Height:      72 inches Weight:      208.50 pounds BMI:     28.38 Pulse rate:   55 / minute Pulse rhythm:   regular Resp:     18 per minute BP sitting:   100 / 58  (left arm) Cuff size:   large  Vitals Entered By: Sidney Ace (January 26, 2010 11:10 AM)  Physical Exam  General:  Pt is alert and oriented, elderly male, in no acute distress. HEENT: normal Neck: normal carotid upstrokes without bruits, JVP normal Lungs: CTA CV: RRR without murmur or gallop Abd: soft, NT, positive BS, no bruit, no organomegaly Ext: no clubbing, cyanosis, or edema. peripheral pulses 2+ and equal Skin: warm and dry without rash    Holter Monitor  Procedure date:  01/01/2010  Findings:      Sinus brady with PAC's and PVC's.  Carotid Doppler  Procedure date:  01/09/2010  Findings:      0-39% bilateral carotid stenosis.  EKG  Procedure date:  01/26/2010  Findings:      Sinus brady 56 bpm, otherwise within normal limits.  Impression & Recommendations:  Problem # 1:  SYNCOPE (ICD-780.2) Suspect reflex syncope  as the etiology of his event. He underwent eval with a Holter and carotid duplex, both with essentially normal findings. LV function has been normal in past and he has no CAD from cath in 2000. Reviewed the importance of staying well-hydrated and avoiding extreme heat.  His updated medication list for this problem includes:    Lisinopril 40 Mg Tabs (Lisinopril) .Marland Kitchen... Take 1 by mouth qd     Aspirin 81 Mg Tbec (Aspirin) .Marland Kitchen... Take one tablet by mouth daily  Orders: EKG w/ Interpretation (93000)  Problem # 2:  HYPERTENSION (ICD-401.9) BP controlled.  His updated medication list for this problem includes:    Lisinopril 40 Mg Tabs (Lisinopril) .Marland Kitchen... Take 1 by mouth qd    Aspirin 81 Mg Tbec (Aspirin) .Marland Kitchen... Take one tablet by mouth daily  BP today: 100/58 Prior BP: 110/62 (12/31/2009)  Labs Reviewed: K+: 4.3 (12/31/2009) Creat: : 1.3 (12/31/2009)   Chol: 137 (11/03/2009)   HDL: 40.30 (11/03/2009)   LDL: 75 (11/03/2009)   TG: 111.0 (11/03/2009)  Problem # 3:  HYPERLIPIDEMIA (ICD-272.4) Lipids at goal on current Rx.  His updated medication list for this problem includes:    Crestor 40 Mg Tabs (Rosuvastatin calcium) .Marland Kitchen... Take 1 by mouth qd  CHOL: 137 (11/03/2009)   LDL: 75 (11/03/2009)   HDL: 40.30 (11/03/2009)   TG: 111.0 (11/03/2009)  Patient Instructions: 1)  Your physician recommends that you continue on your current medications as directed. Please refer to the Current Medication list given to you today. 2)  Your physician wants you to follow-up in:   1 YEAR. You will receive a reminder letter in the mail two months in advance. If you don't receive a letter, please call our office to schedule the follow-up appointment. 3)  Please drink at least 6-8 glasses of water everyday.  Avoid extreme temperatures.

## 2010-07-30 NOTE — Letter (Signed)
Summary: Generic Letter  [facility]   [place], [place] [address]   Phone: [phone]  Fax: [phone]    06/26/2010  [name] [place], [place]  [address]  Dear [name],   Dr. Loanne Drilling would like you to sign a release of records information sheet to sent to [facility] for your heart cauterization. He has reviewed your chart and was unable to find the information in your record. Please sign and date and mail the form back to [place] at the address listed on the release form or fax back to [place] at [phone]. Thank you for your cooperation.       Sincerely,   [name] CMA ([place])

## 2010-07-30 NOTE — Progress Notes (Signed)
  pt signed Danne Baxter him copy EKG  [facility]  April 06, 2010 10:28 AM

## 2010-07-30 NOTE — Assessment & Plan Note (Signed)
Summary: f/u,mc   Vital Signs:  Patient profile:   77 year old male BP sitting:   122 / 66  Vitals Entered By: April Manson CMA (April 06, 2010 9:42 AM)  Referring Jaleyah Longhi:  Dr. Loanne Drilling [facility] Suyash Amory:  Donavan Foil MD   History of Present Illness: 77 yo M here for f/u of osteoarthritis of the hip.  Pain improved significantly. It now only bothers him when he gets in and out of cars.  He has been using a heel lift in his main shoes all the time, and has used his custom orthotics once in his athletic shoes. He is doing his exercises irregularly, but is using the recumbent bicycle at the gym 3 days a week. He tried playing tennis once and did so without pain, but was not as quick as previously.  tramadol causes constipation and he is using stool softener  Allergies: 1)  ! Sulfa  Physical Exam  General:  Well-developed,well-nourished,in no acute distress; alert,appropriate and cooperative throughout examination Msk:  Hip: Limitation to internal and external rotation on both legs, R>L. Internal rotation most restricted motion on R. Improved from last visit.  Hip flexion to 100 degrees on R, 115 on L.   FABER tight bilaterally.  4/5 strength hip flexion R, 5/5 on L. 4+/5 hip adduction, 5/5 abduction on R, 5/5 with adduction/abduction on L. Neurologic:  Gait improved with better movement and flexibility of hip joint, more fluid motion in stride.   Impression & Recommendations:  Problem # 1:  OSTEOARTHRITIS, HIP (ICD-715.95) Continue Tramadol, Mobic. Patient encouraged to continue strength and flexibility exercises including adduction, abduction, lift and external rotation. Also instructed on isometric strengthening. Will follow up in 6 months as he and his wife are going to [place] for the winter.  His updated medication list for this problem includes:    Aspirin 81 Mg Tbec (Aspirin) .Marland Kitchen... Take one tablet by mouth daily    Tramadol Hcl 50 Mg Tabs (Tramadol hcl) .Marland Kitchen... 1  tab by mouth four times a day as needed for pain    Meloxicam 7.5 Mg Tabs (Meloxicam) .Marland Kitchen... 1 by mouth qd  Problem # 2:  UNEQUAL LEG LENGTH (ICD-736.81) Continue use of lift in walking shoes and orthotic in tennis shoes.  given extra lifts for other shoes  Complete Medication List: 1)  Lisinopril 40 Mg Tabs (Lisinopril) .... Take 1 by mouth qd 2)  Citalopram Hydrobromide 20 Mg Tabs (Citalopram hydrobromide) .... Take 1/2 tab qod 3)  Crestor 40 Mg Tabs (Rosuvastatin calcium) .... Take 1 by mouth qd 4)  Actos 45 Mg Tabs (Pioglitazone hcl) .... Take 1/2 tab by mouth qd 5)  Vitamin D 2000 Unit Tabs (Cholecalciferol) .Marland Kitchen.. 1 by mouth daily 6)  Aspirin 81 Mg Tbec (Aspirin) .... Take one tablet by mouth daily 7)  Cyclobenzaprine Hcl 10 Mg Tabs (Cyclobenzaprine hcl) .Marland Kitchen.. 1 by mouth daily as needed for leg cramps 8)  Vitamin B-1 100 Mg Tabs (Thiamine hcl) .Marland Kitchen.. 1 by mouth daily 9)  Tramadol Hcl 50 Mg Tabs (Tramadol hcl) .Marland Kitchen.. 1 tab by mouth four times a day as needed for pain 10)  Meloxicam 7.5 Mg Tabs (Meloxicam) .Marland Kitchen.. 1 by mouth qd Prescriptions: TRAMADOL HCL 50 MG TABS (TRAMADOL HCL) 1 tab by mouth four times a day as needed for pain  #120 x 11   Entered and Authorized by:   Stefanie Libel MD   Signed by:   Stefanie Libel MD on 04/06/2010   Method used:  Electronically to        [id number] (retail)       [place], [place]  [address]       Ph: [phone]       Fax: [phone]   RxID:   [id number] MELOXICAM 7.5 MG TABS (MELOXICAM) 1 by mouth qd  #30 x 11   Entered and Authorized by:   Stefanie Libel MD   Signed by:   Stefanie Libel MD on 04/06/2010   Method used:   Electronically to        [id number] (retail)       [address] [place], [place]  [address]       Ph: [phone]       Fax: [phone]   RxID:   [id number]

## 2010-07-30 NOTE — Progress Notes (Signed)
  Phone Note Call from Patient Call back at [phone] Mrs. Luten's cell   Caller: Spouse Summary of Call: Pt's Spouse called asking when to expect the results for Mr. Manago's carotid test at Cardiology today. They will be out of town until Tuesday and if results are sent back before then,  she would like to be notified by cell number above.  Initial call taken by: [name] MA,  January 09, 2010 11:50 AM  Follow-up for Phone Call        (awaiting results) Follow-up by: Donavan Foil MD,  January 09, 2010 12:22 PM  Additional Follow-up for Phone Call Additional follow up Details #1::        please leave message on phone tree--normal  Additional Follow-up by: Donavan Foil MD,  January 13, 2010 1:20 PM    Additional Follow-up for Phone Call Additional follow up Details #2::    left message on phone tree Follow-up by: [name] MA,  January 13, 2010 3:47 PM

## 2010-07-30 NOTE — Assessment & Plan Note (Signed)
Summary: SLEEP DISORDER///kp   Visit Type:  Initial Consult Copy to:  Dr. Loanne Drilling Primary Provider/Referring Provider:  Donavan Foil MD  CC:  Sleep consult.  Epworth score is 11.[name]  History of Present Illness: 77 yo male for sleep evaluation.  He was at a get together with his wife recently, and some of there friends were talking about sleep apnea.  His wife commented that he has similar symptpoms with his sleep.  He has snored for years.  He does not feel he has a problem with his sleep.  His wife has seen him stop breathing.    He goes to bed at midnight, and falls asleep quickly.  He wakes up once or twice to use the bathroom.  He wakes up at 8am, and feels okay.  He denies morning headache.  He naps for an hour in the afternoon.  He drinks two cups of coffee in the morning.  He does not use anything to help him sleep.  He denies sleep walking, sleep talking, nightmares, or bruxism.  There is no history of restless legs, but he does get leg cramps occasionally.  He denies sleep hallucinations, sleep paralysis, or cataplexy.  His weight has been steady.  His mood is okay.  He is being evaluated for a thyroid nodule.  He did have his tonsills removed.  Preventive Screening-Counseling & Management  Alcohol-Tobacco     Smoking Status: quit     Year Quit: 1995     Pipe use/week: 12 per day  Current Medications (verified): 1)  Lisinopril 40 Mg  Tabs (Lisinopril) .... Take 1 By Mouth Qd 2)  Citalopram Hydrobromide 20 Mg  Tabs (Citalopram Hydrobromide) .... Take 1/2 Tab Qod 3)  Crestor 40 Mg  Tabs (Rosuvastatin Calcium) .... Take 1 By Mouth Qd 4)  Actos 45 Mg  Tabs (Pioglitazone Hcl) .... Take 1/2 Tab By Mouth Qd 5)  Vitamin D 2000 Unit Tabs (Cholecalciferol) .[name].. 1 By Mouth Daily 6)  Aspirin 81 Mg Tbec (Aspirin) .... Take One Tablet By Mouth Daily 7)  Cyclobenzaprine Hcl 10 Mg Tabs (Cyclobenzaprine Hcl) .[name].. 1 By Mouth Daily As Needed For Leg Cramps 8)  Vitamin B-1 100 Mg Tabs  (Thiamine Hcl) .[name].. 1 By Mouth Daily  Allergies (verified): 1)  ! Sulfa  Past History:  Past Medical History: Reviewed history from 03/08/2009 and no changes required. Current Problems:  HYPERTENSION (ICD-401.9) HYPERLIPIDEMIA (ICD-272.4) BRADYCARDIA (ICD-427.89) RENAL INSUFFICIENCY (ICD-588.9) THROMBOCYTOPENIA (ICD-287.5) THYROID NODULE, RIGHT (ICD-241.0) FOREIGN BODY, ASPIRATION (ICD-934.9) PROSTATE CANCER (ICD-185) UNSPECIFIED ANEMIA (ICD-285.9) OSTEOARTHRITIS (ICD-715.90) DIVERTICULOSIS, COLON (ICD-562.10) DIABETES MELLITUS, TYPE II (ICD-250.00) DEPRESSION (ICD-311) COLONIC POLYPS, HX OF (ICD-V12.72) GLAUCOMA (ICD-365.9)  Past Surgical History: Prostatectomy(1997) Heart Cartherization (2000) Tonsillectomy  Family History: Reviewed history from 03/08/2009 and no changes required. No family history of coronary artert disease. Kidney disease---father  Social History: Reviewed history from 03/08/2009 and no changes required. Retired Married He plays tennis 3x a week No alcohol No drug use No tobacco use Patient states former smoker.  Quit in 1995. Pipe use/week:  12 per day  Review of Systems       The patient complains of shortness of breath with activity.  The patient denies shortness of breath at rest, productive cough, non-productive cough, coughing up blood, chest pain, irregular heartbeats, acid heartburn, indigestion, loss of appetite, weight change, abdominal pain, difficulty swallowing, sore throat, tooth/dental problems, headaches, nasal congestion/difficulty breathing through nose, sneezing, itching, ear ache, anxiety, depression, hand/feet swelling, joint stiffness or pain, rash, change  in color of mucus, and fever.    Vital Signs:  Patient profile:   77 year old male Height:      72 inches (182.88 cm) Weight:      210 pounds (95.45 kg) BMI:     28.58 O2 Sat:      99 % on Room air Temp:     98.1 degrees F (36.72 degrees C) oral Pulse rate:   61 /  minute BP sitting:   110 / 60  (right arm) Cuff size:   regular  Vitals Entered By: Francesca Jewett CMA (Nov 21, 2009 2:33 PM)  O2 Sat at Rest %:  99 O2 Flow:  Room air CC: Sleep consult.  Epworth score is 11. Is Patient Diabetic? Yes Comments Medications reviewed. Daytime phone number verified. Francesca Jewett CMA  Nov 21, 2009 2:47 PM   Physical Exam  General:  normal appearance and healthy appearing.   Eyes:  PERRLA and EOMI.   Nose:  Narrow nasal angle, no discharge Mouth:  MP 4, enlarged tongue, scalloped borders Neck:  no JVD.   Chest Wall:  no deformities noted Lungs:  clear bilaterally to auscultation and percussion Heart:  regular rate and rhythm, S1, S2 without murmurs, rubs, gallops, or clicks Abdomen:  bowel sounds positive; abdomen soft and non-tender without masses, or organomegaly Msk:  no deformity or scoliosis noted with normal posture Pulses:  pulses normal Extremities:  no clubbing, cyanosis, edema, or deformity noted Neurologic:  CN II-XII grossly intact with normal reflexes, coordination, muscle strength and tone Cervical Nodes:  no significant adenopathy Psych:  alert and cooperative; normal mood and affect; normal attention span and concentration   Impression & Recommendations:  Problem # 1:  SLEEP DISORDER (ICD-780.50)  He has symptoms and physical findings suggestive of sleep apnea.  He also has cardiovascular disease and diabetes.  To further evaluate this I will schedule him for a sleep test.  I have reviewed how sleep apnea can affect his health.  Driving precautions were reviewed.  Discussed how his weight can affect his sleep.  If he has sleep apnea, depending on the severity, he may be a candidate for oral appliance.  Medications Added to Medication List This Visit: 1)  Vitamin D 2000 Unit Tabs (Cholecalciferol) .[name].. 1 by mouth daily 2)  Cyclobenzaprine Hcl 10 Mg Tabs (Cyclobenzaprine hcl) .[name].. 1 by mouth daily as needed for leg cramps 3)  Vitamin  B-1 100 Mg Tabs (Thiamine hcl) .[name].. 1 by mouth daily  Complete Medication List: 1)  Lisinopril 40 Mg Tabs (Lisinopril) .... Take 1 by mouth qd 2)  Citalopram Hydrobromide 20 Mg Tabs (Citalopram hydrobromide) .... Take 1/2 tab qod 3)  Crestor 40 Mg Tabs (Rosuvastatin calcium) .... Take 1 by mouth qd 4)  Actos 45 Mg Tabs (Pioglitazone hcl) .... Take 1/2 tab by mouth qd 5)  Vitamin D 2000 Unit Tabs (Cholecalciferol) .[name].. 1 by mouth daily 6)  Aspirin 81 Mg Tbec (Aspirin) .... Take one tablet by mouth daily 7)  Cyclobenzaprine Hcl 10 Mg Tabs (Cyclobenzaprine hcl) .[name].. 1 by mouth daily as needed for leg cramps 8)  Vitamin B-1 100 Mg Tabs (Thiamine hcl) .[name].. 1 by mouth daily  Other Orders: Consultation Level IV [id number]) Sleep Disorder Referral (Sleep Disorder)  Patient Instructions: 1)  Will schedule sleep test 2)  Will call to schedule follow up after sleep test reviewed

## 2010-07-30 NOTE — Progress Notes (Signed)
Summary: Need Appt  Phone Note Call from Patient Call back at Home Phone [phone]   Caller: Patient Reason for Call: Talk to Nurse Summary of Call: returning call Initial call taken by: Darnell Level,  December 31, 2009 4:27 PM  Follow-up for Phone Call        Left message for pt. Pt needs appt with MD next week. Theodosia Quay, RN, BSN  December 31, 2009 4:36 PM  I spoke with Dr Burt Knack and he would like to see the pt after he has completed his monitor and carotid study.  I left a message for the pt to call back. Theodosia Quay, RN, BSN  January 01, 2010 11:37 AM  Additional Follow-up for Phone Call Additional follow up Details #1::        I spoke with the pt and he picked up his monitor today.  The pt is scheduled for carotid on 01/09/10.  I made the pt aware that Dr Burt Knack would like to see him on 01/19/10.  The pt said he would be out of town that week.  I scheduled the pt to see Dr Burt Knack on 01/26/10.  Additional Follow-up by: Theodosia Quay, RN, BSN,  January 01, 2010 12:11 PM

## 2010-07-30 NOTE — Assessment & Plan Note (Signed)
Summary: 6 MTH FU PER PT OOT IN NOV--D/T---STC   Vital Signs:  Patient profile:   77 year old male Height:      72 inches (182.88 cm) Weight:      210 pounds (95.45 kg) BMI:     28.58 O2 Sat:      97 % on Room air Temp:     98.2 degrees F (36.78 degrees C) oral Pulse rate:   60 / minute Pulse rhythm:   regular BP sitting:   122 / 68  (left arm) Cuff size:   regular  Vitals Entered By: [name] MA (April 08, 2010 11:03 AM)  O2 Flow:  Room air CC: 6 month F/U/aj Is Patient Diabetic? Yes   Referring Provider:  Dr. Loanne Drilling Primary Provider:  Donavan Foil MD  CC:  6 month F/U/aj.  History of Present Illness: the status of at least 3 ongoing medical problems is addressed today: anemia:  no change.  no brbpr. dm:  he has little or no weight gain renal insufficiency:  no sob  Current Medications (verified): 1)  Lisinopril 40 Mg  Tabs (Lisinopril) .... Take 1 By Mouth Qd 2)  Citalopram Hydrobromide 20 Mg  Tabs (Citalopram Hydrobromide) .... Take 1/2 Tab Qod 3)  Crestor 40 Mg  Tabs (Rosuvastatin Calcium) .... Take 1 By Mouth Qd 4)  Actos 45 Mg  Tabs (Pioglitazone Hcl) .... Take 1/2 Tab By Mouth Qd 5)  Vitamin D 2000 Unit Tabs (Cholecalciferol) .Marland Kitchen.. 1 By Mouth Daily 6)  Aspirin 81 Mg Tbec (Aspirin) .... Take One Tablet By Mouth Daily 7)  Cyclobenzaprine Hcl 10 Mg Tabs (Cyclobenzaprine Hcl) .Marland Kitchen.. 1 By Mouth Daily As Needed For Leg Cramps 8)  Vitamin B-1 100 Mg Tabs (Thiamine Hcl) .Marland Kitchen.. 1 By Mouth Daily 9)  Tramadol Hcl 50 Mg Tabs (Tramadol Hcl) .Marland Kitchen.. 1 Tab By Mouth Four Times A Day As Needed For Pain 10)  Meloxicam 7.5 Mg Tabs (Meloxicam) .Marland Kitchen.. 1 By Mouth Qd  Allergies (verified): 1)  ! Sulfa  Past History:  Past Medical History: Last updated: 02/17/2010 Current Problems:  HYPERTENSION (ICD-401.9) HYPERLIPIDEMIA (ICD-272.4) BRADYCARDIA (ICD-427.89) RENAL INSUFFICIENCY (ICD-588.9) THROMBOCYTOPENIA (ICD-287.5) THYROID NODULE, RIGHT (ICD-241.0) FOREIGN BODY,  ASPIRATION (ICD-934.9) PROSTATE CANCER (ICD-185) UNSPECIFIED ANEMIA (ICD-285.9) OSTEOARTHRITIS (ICD-715.90) DIVERTICULOSIS, COLON (ICD-562.10) DIABETES MELLITUS, TYPE II (ICD-250.00) DEPRESSION (ICD-311) COLONIC POLYPS, HX OF (ICD-V12.72) GLAUCOMA (ICD-365.9) SYNCOPE 2011 OSA      - PSG 07/11/11RDI 11, PLMI 56  Past Surgical History: Prostatectomy(1997) Heart Cartherization (2000) Tonsillectomy 1940's  Family History: Reviewed history from 11/21/2009 and no changes required. No family history of coronary artert disease. Kidney disease---father no cancer  Social History: Reviewed history from 11/21/2009 and no changes required. Retired Married He plays tennis 3x a week No alcohol No drug use No tobacco use Patient states former smoker.  Quit in 1995.  Review of Systems  The patient denies hematuria, weight loss, and syncope.    Physical Exam  General:  normal appearance.   Head:  head: no deformity eyes: no periorbital swelling, no proptosis external nose and ears are normal mouth: no lesion seen Ears:  bilat hearing aids Neck:  Supple without thyroid enlargement or tenderness.  Lungs:  Clear to auscultation bilaterally. Normal respiratory effort.  Heart:  Regular rate and rhythm without murmurs or gallops noted. Normal S1,S2.   Pulses:  dorsalis pedis intact bilat.  no carotid bruit  Extremities:  no deformity.  no ulcer on the feet.  feet are of normal color and  temp.  2+ right pedal edema and 2+ left pedal edema.   Neurologic:  cn 2-12 grossly intact.   readily moves all 4's.   sensation is intact to touch on the feet  Skin:  no rash seen Psych:  Alert and cooperative; normal mood and affect; normal attention span and concentration.   Additional Exam:  BUN                  [H]  39 mg/dL                    6-23 Creatinine           [H]  1.6 mg/dL    Hemoglobin           [L]  11.6 g/dL                   13.0-17.0 Hematocrit           [L]  33.7 %           Impression & Recommendations:  Problem # 1:  RENAL INSUFFICIENCY (ICD-588.9) Assessment Unchanged  Problem # 2:  EDEMA (ICD-782.3) prob due to actos  Problem # 3:  DIABETES MELLITUS, TYPE II (ICD-250.00) well-controlled  Problem # 4:  UNSPECIFIED ANEMIA (ICD-285.9) Assessment: Unchanged  Medications Added to Medication List This Visit: 1)  Januvia 100 Mg Tabs (Sitagliptin phosphate) .... 1/2 tab each am  Other Orders: Est. Patient Level IV [id number])  Patient Instructions: 1)  we'll follow the anemia.   2)  return spring 2012, with a1c 252.00, cbc/b-12/iron panel 285.9, bmet 250.01  microalbumin 250.01, lipids 272.0.   3)  stop actos.  4)  in 2 weeks, start [place] 1/2 of 100 mg each am. 5)  Please schedule a "medicare wellness" appointment in 6-8  months. 6)  (update:  his surgical risk is low and outweighed by the potential benefit of the surgery.  he is therefore medically cleared). Prescriptions: CRESTOR 40 MG  TABS (ROSUVASTATIN CALCIUM) take 1 by mouth qd  #30 Tablet x 11   Entered and Authorized by:   Donavan Foil MD   Signed by:   Donavan Foil MD on 04/08/2010   Method used:   Print then Give to Patient   RxID:   [id number] CITALOPRAM HYDROBROMIDE 20 MG  TABS (CITALOPRAM HYDROBROMIDE) take 1/2 tab qod  #30 x 3   Entered and Authorized by:   Donavan Foil MD   Signed by:   Donavan Foil MD on 04/08/2010   Method used:   Print then Give to Patient   RxID:   [id number] LISINOPRIL 40 MG  TABS (LISINOPRIL) take 1 by mouth qd  #30 x 11   Entered and Authorized by:   Donavan Foil MD   Signed by:   Donavan Foil MD on 04/08/2010   Method used:   Print then Give to Patient   RxID:   [id number]

## 2010-07-30 NOTE — Assessment & Plan Note (Signed)
Summary: f/u sleep results/LC   Copy to:  Dr. Loanne Drilling Primary Provider/Referring Provider:  Donavan Foil MD  CC:  F/U sleep results .  History of Present Illness: 77 yo male with mild OSA.  He had his sleep test on January 05, 2010:  AHI 3.5, RDI 11, O2 nadir 91%, PLMI 56.  This is consistent with mild sleep apnea with periodic limb movement syndrome.  He does get frequent leg cramps at night.  He uses flexeril and this helps.    Current Medications (verified): 1)  Lisinopril 40 Mg  Tabs (Lisinopril) .... Take 1 By Mouth Qd 2)  Citalopram Hydrobromide 20 Mg  Tabs (Citalopram Hydrobromide) .... Take 1/2 Tab Qod 3)  Crestor 40 Mg  Tabs (Rosuvastatin Calcium) .... Take 1 By Mouth Qd 4)  Actos 45 Mg  Tabs (Pioglitazone Hcl) .... Take 1/2 Tab By Mouth Qd 5)  Vitamin D 2000 Unit Tabs (Cholecalciferol) .Marland Kitchen.. 1 By Mouth Daily 6)  Aspirin 81 Mg Tbec (Aspirin) .... Take One Tablet By Mouth Daily 7)  Cyclobenzaprine Hcl 10 Mg Tabs (Cyclobenzaprine Hcl) .Marland Kitchen.. 1 By Mouth Daily As Needed For Leg Cramps 8)  Vitamin B-1 100 Mg Tabs (Thiamine Hcl) .Marland Kitchen.. 1 By Mouth Daily  Allergies (verified): 1)  ! Sulfa  Past History:  Past Surgical History: Last updated: 11/21/2009 Prostatectomy(1997) Heart Cartherization (2000) Tonsillectomy  Past Medical History: Current Problems:  HYPERTENSION (ICD-401.9) HYPERLIPIDEMIA (ICD-272.4) BRADYCARDIA (ICD-427.89) RENAL INSUFFICIENCY (ICD-588.9) THROMBOCYTOPENIA (ICD-287.5) THYROID NODULE, RIGHT (ICD-241.0) FOREIGN BODY, ASPIRATION (ICD-934.9) PROSTATE CANCER (ICD-185) UNSPECIFIED ANEMIA (ICD-285.9) OSTEOARTHRITIS (ICD-715.90) DIVERTICULOSIS, COLON (ICD-562.10) DIABETES MELLITUS, TYPE II (ICD-250.00) DEPRESSION (ICD-311) COLONIC POLYPS, HX OF (ICD-V12.72) GLAUCOMA (ICD-365.9) SYNCOPE 2011 OSA      - PSG 07/11/11RDI 11, PLMI 56  Vital Signs:  Patient profile:   77 year old male Weight:      211.50 pounds O2 Sat:      96 % on Room air Temp:      97.8 degrees F oral Pulse rate:   64 / minute BP sitting:   110 / 60  (left arm) Cuff size:   regular  Vitals Entered By: Doroteo Glassman RN (February 17, 2010 3:01 PM)  O2 Flow:  Room air CC: F/U sleep results    Physical Exam  General:  normal appearance and healthy appearing.   Nose:  Narrow nasal angle, no discharge Mouth:  MP 4, enlarged tongue, scalloped borders Neck:  no JVD.   Lungs:  clear bilaterally to auscultation and percussion Heart:  regular rate and rhythm, S1, S2 without murmurs, rubs, gallops, or clicks Extremities:  no clubbing, cyanosis, edema, or deformity noted Cervical Nodes:  no significant adenopathy   Impression & Recommendations:  Problem # 1:  OBSTRUCTIVE SLEEP APNEA (ICD-327.23) He has mild sleep apnea.  His sleep test was reviewed.  Explained how sleep apnea can affect his health.  Driving precautions were discussed.  Have advised attempt at positional therapy first.  Also advised to maintain a reasonable weight.  He will check with his dentist and insurance company to determine if he is a candidate for an oral appliance.  He will be in [place] from November through April.  Explained that he could call if would like to pursue earlier intervention.  Otherwise will plan on seeing him back when he returns from [place].  Problem # 2:  LEG CRAMPS (ICD-729.82) He has frequent leg cramps at night, and uses flexeril.  He also had an increase in his periodic limb movement index  from his sleep test.  Explained that sleep disruption from sleep apnea could contribute to this.  Will re-assess after he is treated for his sleep apnea.    Also explained how electrolyte abnormalities could contribute to this, and he is to follow up with his primary physician to further assess this.    Of note is that he is on citalopram, and this class of medications can be associated with restless leg syndrome/periodic limb movements.  Will defer to his primary physician whether he can  have this changed to an alternative agent.  Complete Medication List: 1)  Lisinopril 40 Mg Tabs (Lisinopril) .... Take 1 by mouth qd 2)  Citalopram Hydrobromide 20 Mg Tabs (Citalopram hydrobromide) .... Take 1/2 tab qod 3)  Crestor 40 Mg Tabs (Rosuvastatin calcium) .... Take 1 by mouth qd 4)  Actos 45 Mg Tabs (Pioglitazone hcl) .... Take 1/2 tab by mouth qd 5)  Vitamin D 2000 Unit Tabs (Cholecalciferol) .Marland Kitchen.. 1 by mouth daily 6)  Aspirin 81 Mg Tbec (Aspirin) .... Take one tablet by mouth daily 7)  Cyclobenzaprine Hcl 10 Mg Tabs (Cyclobenzaprine hcl) .Marland Kitchen.. 1 by mouth daily as needed for leg cramps 8)  Vitamin B-1 100 Mg Tabs (Thiamine hcl) .Marland Kitchen.. 1 by mouth daily  Other Orders: Est. Patient Level III [id number])  Patient Instructions: 1)  Try positional therapy for sleep apnea 2)  Speak to your dentist about whether you would be a candidate for an oral appliance (mandibular advancement device) to treat sleep apnea 3)  Follow up in April 2012

## 2010-07-30 NOTE — Letter (Signed)
Summary: [place]   Imported By: Rise Patience 12/02/2009 15:39:38  _____________________________________________________________________  External Attachment:    Type:   Image     Comment:   External Document

## 2010-07-30 NOTE — Assessment & Plan Note (Signed)
Summary: NP,UPPER LEG ISSUES,MC   Vital Signs:  Patient profile:   77 year old male BP sitting:   119 / 69  Vitals Entered By: April Manson CMA (March 05, 2010 11:08 AM)  Referring Provider:  Dr. Loanne Drilling Primary Provider:  Donavan Foil MD   History of Present Illness: 77yo male to office w/ c/o R groin pain x 1.5 months. Thinks may have pulled something while loading a suitcase for car trip up [place]. Able to play tennis several times, but this seemed to aggrevate it. Feels pain is getting worse.  Does experience some night time pain. Saw orthopedist - Dr. Delilah Shan at [facility] 2-weeks ago, he ordered CT of the hip with concern for stress fx. CT Rt hip 02/26/10 showed DJD, but no stress fx. Dr Delilah Shan recommended decreased activity & mobic daily, which patient has been using. No previous hip/groin issues. Swept driveway yesterday with mild discomfort. Denies any numbness or tingling, denies back pain, denies weakness.  Allergies: 1)  ! Sulfa  Past History:  Past Medical History: Last updated: 02/17/2010 Current Problems:  HYPERTENSION (ICD-401.9) HYPERLIPIDEMIA (ICD-272.4) BRADYCARDIA (ICD-427.89) RENAL INSUFFICIENCY (ICD-588.9) THROMBOCYTOPENIA (ICD-287.5) THYROID NODULE, RIGHT (ICD-241.0) FOREIGN BODY, ASPIRATION (ICD-934.9) PROSTATE CANCER (ICD-185) UNSPECIFIED ANEMIA (ICD-285.9) OSTEOARTHRITIS (ICD-715.90) DIVERTICULOSIS, COLON (ICD-562.10) DIABETES MELLITUS, TYPE II (ICD-250.00) DEPRESSION (ICD-311) COLONIC POLYPS, HX OF (ICD-V12.72) GLAUCOMA (ICD-365.9) SYNCOPE 2011 OSA      - PSG 07/11/11RDI 11, PLMI 56  Past Surgical History: Last updated: 11/21/2009 Prostatectomy(1997) Heart Cartherization (2000) Tonsillectomy  Family History: Last updated: 11/21/2009 No family history of coronary artert disease. Kidney disease---father  Social History: Last updated: 11/21/2009 Retired Married He plays tennis 3x a week No alcohol No drug  use No tobacco use Patient states former smoker.  Quit in 1995.  Risk Factors: Smoking Status: quit (11/21/2009) Pipe Use/wk: 12 per day (11/21/2009)  Review of Systems      See HPI  Physical Exam  General:  Well-developed,well-nourished,in no acute distress; alert,appropriate and cooperative throughout examination Head:  Normocephalic and atraumatic without obvious abnormalities.  Eyes:  PERRLA, EOMI Lungs:  Normal respiratory effort, chest expands symmetrically.  Msk:  HIPS:  - R hip: no gross deformity.  Mildly TTP over Rt groin, no TTP over greater troch, ITB, piriformis.  Restricted ROM with flexion 120 with pain, extension 45 without pain, IR 20 w/ pain, ER 45 with pain. (+)log roll.  Strength +4/5 flexion, extension, abduction, adduction. - L hip: no gross deformity.  No tenderness.  Mildly restricted ROM, but less than Rt.  Flexion 120, ext 45, IR 45, ER 75 - all without pain.  neg log roll.  Strength +5/5 flexion, extension, abduction, adduction.  KNEES: normal ROM without pain, tenderness, deformity, instability, weakness bilaterally.  BACK: no scoliosis, normal ROM of L-spine without pain.  Neg SLR.  GAIT: trendelenburg gait with shift to the left.  2cm leg length discrepancy - Rt leg longer than left.   - After placing heel lift in left shoe pt still had persistant trendelburg, but it was improved. Neurologic:  sensation intact to light touch.   DTR +2/4 LE b/l in achilles & patella Additional Exam:  IMAGING: CT Scan of Rt hip from 02/26/10 was personally reviewed showing moderate DJD of the right hip.  No signs of stress fracture.  Does have small cysts noted in head of the femur.    Impression & Recommendations:  Problem # 1:  HIP PAIN, RIGHT (ICD-719.45) Assessment New  - Pain secondary to DJD  as noted on CT scan from 02/26/10 which was personally reviewed. - Rx for tramadol twice daily as needed pain/discomfort - Should use semi-recumbent stationary bike for  exercise and limit walking & tennis over the next several weeks. - Do home exercises as shown in office. - Wear heel lift in left shoe as directed. - f/u 27-month for re-evaluation, prior to going to [place]. - f/u in next 1-2 weeks with tennis shoes for custom orthotics  His updated medication list for this problem includes:    Aspirin 81 Mg Tbec (Aspirin) .Marland Kitchen... Take one tablet by mouth daily    Cyclobenzaprine Hcl 10 Mg Tabs (Cyclobenzaprine hcl) .Marland Kitchen... 1 by mouth daily as needed for leg cramps    Tramadol Hcl 50 Mg Tabs (Tramadol hcl) .Marland Kitchen... 1 tab by mouth two times a day as needed for pain  Orders: Foot Insert [id number])  Problem # 2:  OSTEOARTHRITIS, HIP (ICD-715.95) Assessment: New - DJD of Rt hip.  See plan as stated above.  His updated medication list for this problem includes:    Aspirin 81 Mg Tbec (Aspirin) .Marland Kitchen... Take one tablet by mouth daily    Tramadol Hcl 50 Mg Tabs (Tramadol hcl) .Marland Kitchen... 1 tab by mouth two times a day as needed for pain  Problem # 3:  UNEQUAL LEG LENGTH (ICD-736.81) Assessment: New  - 2cm leg length discrepancy w/ Rt leg longer.  This is likely contributing to his pain along with the DJD - Fitted with 1cm heel lift in left shoe in office today, which patient tolerated.  Given additional lift to place in other pairs of shoes. - f/u 1-2 wks with tennis shoes for custom orthotics  Orders: Foot Insert [id number])  Complete Medication List: 1)  Lisinopril 40 Mg Tabs (Lisinopril) .... Take 1 by mouth qd 2)  Citalopram Hydrobromide 20 Mg Tabs (Citalopram hydrobromide) .... Take 1/2 tab qod 3)  Crestor 40 Mg Tabs (Rosuvastatin calcium) .... Take 1 by mouth qd 4)  Actos 45 Mg Tabs (Pioglitazone hcl) .... Take 1/2 tab by mouth qd 5)  Vitamin D 2000 Unit Tabs (Cholecalciferol) .Marland Kitchen.. 1 by mouth daily 6)  Aspirin 81 Mg Tbec (Aspirin) .... Take one tablet by mouth daily 7)  Cyclobenzaprine Hcl 10 Mg Tabs (Cyclobenzaprine hcl) .Marland Kitchen.. 1 by mouth daily as needed for leg  cramps 8)  Vitamin B-1 100 Mg Tabs (Thiamine hcl) .Marland Kitchen.. 1 by mouth daily 9)  Tramadol Hcl 50 Mg Tabs (Tramadol hcl) .Marland Kitchen.. 1 tab by mouth two times a day as needed for pain  Patient Instructions: 1)  You have arthritis in your right hip that is causing your pain. 2)  Take tramadol 1 tab twice daily as needed for pain/discomfort. 3)  Should start using semi-recumbent stationary bike to lessen impact on hip/legs. 4)  Keep lift in left shoe, place additional lift in any other shoes you wear. 5)  Bring tennis shoes to office & schedule appt for orthotics prior to going to [place]. 6)  Plan to start hitting tennis balls in approx 2-wks after fitted for orthotics. 7)  Do standing hip exercises as shown for: 8)  - hip flexion 9)  - hip extension 10)  - hip adbduction 11)  - hip adduction 12)  Follow-up 85-month for re-evaluation of the hip. Prescriptions: TRAMADOL HCL 50 MG TABS (TRAMADOL HCL) 1 tab by mouth two times a day as needed for pain  #60 x 2   Entered and Authorized by:   Eduardo Osier MD  Signed by:   Eduardo Osier MD on 03/05/2010   Method used:   Electronically to        [id number] (retail)       [address] [place], [place]  [address]       Ph: [phone]       Fax: [phone]   RxID:   [id number]

## 2010-07-30 NOTE — Letter (Signed)
Summary: [place]   Imported By: Bubba Hales 04/27/2010 08:10:00  _____________________________________________________________________  External Attachment:    Type:   Image     Comment:   External Document

## 2010-07-30 NOTE — Miscellaneous (Signed)
Summary: GI PV  Clinical Lists Changes  Medications: Added new medication of MOVIPREP 100 GM  SOLR (PEG-KCL-NACL-NASULF-NA ASC-C) As per prep instructions. - Signed Rx of MOVIPREP 100 GM  SOLR (PEG-KCL-NACL-NASULF-NA ASC-C) As per prep instructions.;  #1 x 0;  Signed;  Entered by: Shaune Spittle RN;  Authorized by: Irene Shipper MD;  Method used: Electronically to [id number] [place], [place], [place], [place]  [address], Ph: [phone], Fax: [phone] Observations: Added new observation of ALLERGY REV: Done (03/18/2008 10:47)    Prescriptions: MOVIPREP 100 GM  SOLR (PEG-KCL-NACL-NASULF-NA ASC-C) As per prep instructions.  #1 x 0   Entered by:   Shaune Spittle RN   Authorized by:   Irene Shipper MD   Signed by:   Shaune Spittle RN on 03/18/2008   Method used:   Electronically to        [phone]* (retail)       [address] [place],   [address]       Ph: [phone]       Fax: [phone]   RxID:   [id number]

## 2010-07-30 NOTE — Assessment & Plan Note (Signed)
Summary: 6 MTH FU--PER D/T--OOT  STC   Vital Signs:  Patient profile:   77 year old male Height:      72 inches (182.88 cm) Weight:      210.13 pounds (95.51 kg) O2 Sat:      98 % on Room air Temp:     97.3 degrees F (36.28 degrees C) oral Pulse rate:   60 / minute BP sitting:   108 / 68  (left arm) Cuff size:   regular  Vitals Entered By: Gardenia Phlegm RMA (Nov 05, 2009 9:13 AM)  O2 Flow:  Room air CC: 6 month follow up/ CF Is Patient Diabetic? Yes   Primary Provider:  Donavan Foil MD  CC:  6 month follow up/ CF.  History of Present Illness: pt says his wife says he awakens in the middle of the night, gasping for breath. he takes meds as rx'ed, and otherwise feels well.  Current Medications (verified): 1)  Lisinopril 40 Mg  Tabs (Lisinopril) .... Take 1 By Mouth Qd 2)  Citalopram Hydrobromide 20 Mg  Tabs (Citalopram Hydrobromide) .... Take 1/2 Tab Qod 3)  Crestor 40 Mg  Tabs (Rosuvastatin Calcium) .... Take 1 By Mouth Qd 4)  Actos 45 Mg  Tabs (Pioglitazone Hcl) .... Take 1/2 Tab By Mouth Qd 5)  Vitamin D 1000 Unit  Tabs (Cholecalciferol) .... Take 1 Tablet By Mouth Once A Day 6)  Aspirin 81 Mg Tbec (Aspirin) .... Take One Tablet By Mouth Daily 7)  Medication For Legs Cramsps Doesn't  Remember Med Name 8)  Vitamin B-1 100 Mg Tabs (Thiamine Hcl) .... Take 1 Tablet By Mouth Two Times A Day  Allergies (verified): 1)  ! Sulfa  Past History:  Past Medical History: Last updated: 03/08/2009 Current Problems:  HYPERTENSION (ICD-401.9) HYPERLIPIDEMIA (ICD-272.4) BRADYCARDIA (ICD-427.89) RENAL INSUFFICIENCY (ICD-588.9) THROMBOCYTOPENIA (ICD-287.5) THYROID NODULE, RIGHT (ICD-241.0) FOREIGN BODY, ASPIRATION (ICD-934.9) PROSTATE CANCER (ICD-185) UNSPECIFIED ANEMIA (ICD-285.9) OSTEOARTHRITIS (ICD-715.90) DIVERTICULOSIS, COLON (ICD-562.10) DIABETES MELLITUS, TYPE II (ICD-250.00) DEPRESSION (ICD-311) COLONIC POLYPS, HX OF (ICD-V12.72) GLAUCOMA (ICD-365.9)  Review of  Systems  The patient denies chest pain and dyspnea on exertion.    Physical Exam  General:  normal appearance.   Neck:  i do not appreciate the nodule Pulses:  dorsalis pedis intact bilat.   Extremities:  no deformity.  no ulcer on the feet.  feet are of normal color and temp.  no edema  Neurologic:  sensation is intact to touch on the feet    Impression & Recommendations:  Problem # 1:  DIABETES MELLITUS, TYPE II (ICD-250.00) well-controlled HgbA1C: 5.8 (11/03/2009)  Problem # 2:  HYPERLIPIDEMIA (ICD-272.4) CHOL: 137 (11/03/2009)   LDL: 75 (11/03/2009)   HDL: 40.30 (11/03/2009)   TG: 111.0 (11/03/2009)  Problem # 3:  PROSTATE CANCER (ICD-185) no vidence of recurrence  Problem # 4:  SLEEP DISORDER (ICD-780.50) Assessment: New  Other Orders: Sleep Disorder Referral (Sleep Disorder) Est. Patient Level III [id number])  Patient Instructions: 1)  same medications 2)  return 6 months, with these labs prior: cbc 287.5    tsh 241.0  bmet 250.00 a1c  250.00  psa  185. 3)  refer sleep specialist.  you will be called with a day and time for an appointment.

## 2010-07-30 NOTE — Progress Notes (Signed)
----   Converted from flag ---- ---- 12/31/2009 9:49 AM, Lorraine Lax wrote: appt 7/15 @ 3:00. need to send holter referral to [facility] ------------------------------

## 2010-07-30 NOTE — Assessment & Plan Note (Signed)
Summary: orthotics,mc   Vital Signs:  Patient profile:   77 year old male BP sitting:   115 / 67  Vitals Entered By: Eduardo Osier MD (March 19, 2010 10:28 AM)  Referring Raenette Sakata:  Dr. Loanne Drilling Primary Arda Daggs:  Donavan Foil MD   History of Present Illness: Rt Hip pain improved with heel lift & tramadol. Still with some pain in his groin, but greatly improved. Has been doing hip exercises & using recumbant bike at the gym. Has not yet returned to tennis. Denies any numbness or tingling. Here for custom orthotics today.   Allergies: 1)  ! Sulfa PMH-FH-SH reviewed for relevance  Review of Systems      See HPI  Physical Exam  General:  Well-developed,well-nourished,in no acute distress; alert,appropriate and cooperative throughout examination Msk:  GAIT: trendelenburg gait with shift to the left.  2cm leg length discrepancy - Rt leg longer than left.  HIP:  - R hip: No gross deformity.  no significant pain today with palpation. ROM on right flexion 120 without pain, extension 45 without pain, IR 30 without pain, ER 45 without pain.  - L hip: no gross deformity.  No tenderness.  Mildly restricted ROM, but less than Rt.  Flexion 120, ext 45, IR 45, ER 75 - all without pain.    KNEES: normal ROM without pain, tenderness, deformity, instability, weakness bilaterally.  BACK: no scoliosis, normal ROM of L-spine without pain.  Neg SLR. Neurologic:  sensation intact to light touch.     Neurologic:  sensation intact to light touch.     Impression & Recommendations:  Problem # 1:  HIP PAIN, RIGHT (ICD-719.45) - Secondary to hip OA & unequal leg length - Fitted with custom orthotics in office today: Patient was fitted for a : standard, cushioned, semi-rigid orthotic. The orthotic was heated and afterward the patient stood on the orthotic blank positioned on the orthotic stand. The patient was positioned in subtalar neutral position and 10 degrees of ankle dorsiflexion  in a weight bearing stance. After completion of molding, a stable base was applied to the orthotic blank. The blank was ground to a stable position for weight bearing. Size: 10 blue swirl Base: blue EVA foam Posting: none Additional orthotic padding: high density cream colored EVA 0.5cm lift placed on heel of left orthotic Orthotics comfortable to patient & gait more neutral. - 30 minutes spent face-to-face with patient during preparation & fitting of orthotics. - Cont. hip exercises as demonstrated last visit. - Cont. using recumbant bike.  Ok to start water aerobics. - cont. tramadol prn - f/u as scheduled in 2-weeks.    His updated medication list for this problem includes:    Aspirin 81 Mg Tbec (Aspirin) .Marland Kitchen... Take one tablet by mouth daily    Cyclobenzaprine Hcl 10 Mg Tabs (Cyclobenzaprine hcl) .Marland Kitchen... 1 by mouth daily as needed for leg cramps    Tramadol Hcl 50 Mg Tabs (Tramadol hcl) .Marland Kitchen... 1 tab by mouth two times a day as needed for pain  Orders: [employer], each unit (L3002)  Problem # 2:  UNEQUAL LEG LENGTH (ICD-736.81) - Left short leg - Custom orthotics made today as stated above with additional heel lift on left.   Orders: [employer], each unit [phone])  Complete Medication List: 1)  Lisinopril 40 Mg Tabs (Lisinopril) .... Take 1 by mouth qd 2)  Citalopram Hydrobromide 20 Mg Tabs (Citalopram hydrobromide) .... Take 1/2 tab qod 3)  Crestor 40 Mg Tabs (Rosuvastatin calcium) .Marland KitchenMarland KitchenMarland Kitchen  Take 1 by mouth qd 4)  Actos 45 Mg Tabs (Pioglitazone hcl) .... Take 1/2 tab by mouth qd 5)  Vitamin D 2000 Unit Tabs (Cholecalciferol) .Marland Kitchen.. 1 by mouth daily 6)  Aspirin 81 Mg Tbec (Aspirin) .... Take one tablet by mouth daily 7)  Cyclobenzaprine Hcl 10 Mg Tabs (Cyclobenzaprine hcl) .Marland Kitchen.. 1 by mouth daily as needed for leg cramps 8)  Vitamin B-1 100 Mg Tabs (Thiamine hcl) .Marland Kitchen.. 1 by mouth daily 9)  Tramadol Hcl 50 Mg Tabs (Tramadol hcl) .Marland Kitchen.. 1 tab by mouth two times a day as needed  for pain

## 2010-07-30 NOTE — Progress Notes (Signed)
  Phone Note Outgoing Call Call back at [facility] Phone [phone]   Call placed by: [name] CMA Deborra Medina),  June 17, 2010 9:03 AM Call placed to: Patient Summary of Call: Left message for pt to callback regarding letter pt sent in for MD to review  Initial call taken by: [name] CMA Deborra Medina),  June 17, 2010 9:05 AM  Follow-up for Phone Call        release of information form and letter mailed to pt's address in [facility] to send to [facility]. per MD's instructions Follow-up by: [name] CMA Deborra Medina),  June 26, 2010 12:23 PM

## 2010-07-30 NOTE — Assessment & Plan Note (Signed)
Summary: flu shot/sae   Nurse Visit   Allergies: 1)  ! Sulfa  Orders Added: 1)  Flu Vaccine 79yrs + MEDICARE PATIENTS [Q2039] 2)  Administration Flu vaccine - MCR [id number] Flu Vaccine Consent Questions     Do you have a history of severe allergic reactions to this vaccine? no    Any prior history of allergic reactions to egg and/or gelatin? no    Do you have a sensitivity to the preservative Thimersol? no    Do you have a past history of Guillan-Barre Syndrome? no    Do you currently have an acute febrile illness? no    Have you ever had a severe reaction to latex? no    Vaccine information given and explained to patient? yes    Are you currently pregnant? no    Lot Number:[id number]   Exp Date:12/26/2010   Site Given  Left Deltoid [email or web address]

## 2010-07-30 NOTE — Procedures (Signed)
Summary: [facility]  [facility]   Imported By: Marilynne Drivers 01/02/2010 16:24:49  _____________________________________________________________________  External Attachment:    Type:   Image     Comment:   External Document

## 2010-10-10 IMAGING — CT CT EXTREM LOW W/O CM*R*
3 of 4 series · 14 of 32 positions shown, 19 images · non-contrast
Comparison: None available.

CLINICAL DATA: Right hip pain.  Question stress fracture.  History
prostate cancer.

CT RIGHT HIP WITHOUT CONTRAST
TECHNIQUE: Multidetector CT imaging of the right hip was performed
according to the standard protocol without intravenous contrast.
Multiplanar CT image reconstructions were also generated.

[Series 2: rt hip /bone · axial · 0.44mm/px · z∈[-222,-192]mm · 2 of 84 slices shown]
[im 12/84  bone]
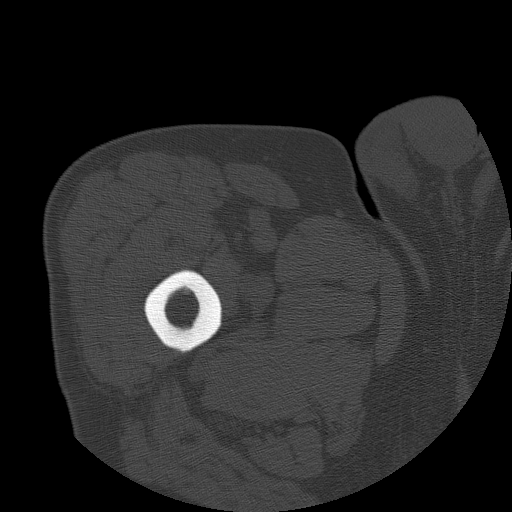
[im 24/84  bone]
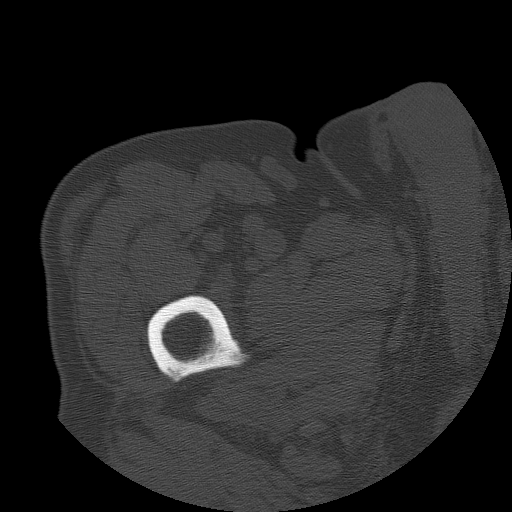

[Series 400: cor rt hip · coronal · 0.44mm/px · 5 of 68 slices shown]
[im 12/68  soft-tissue]
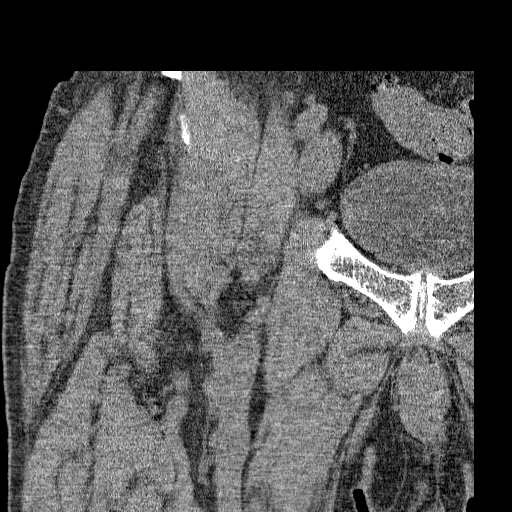
[im 23/68  soft-tissue]
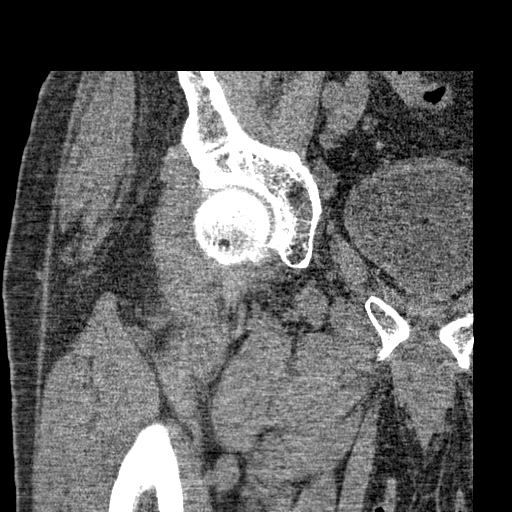
[im 34/68  soft-tissue]
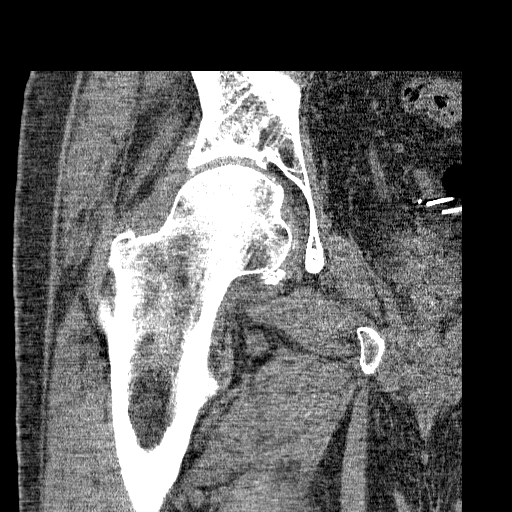
[im 45/68  soft-tissue]
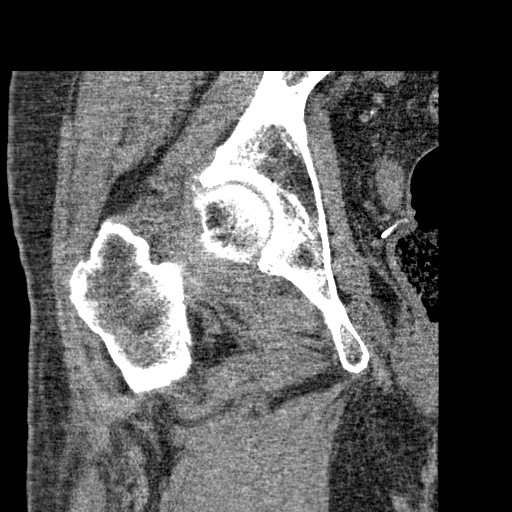
[im 56/68  soft-tissue]
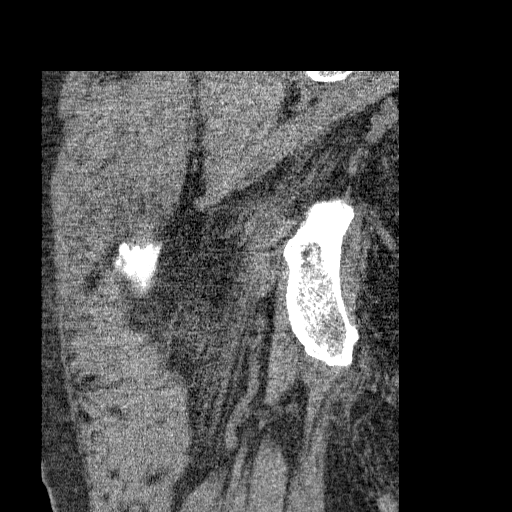

[Series 401: sag rt hip · sagittal · 0.44mm/px · 7 of 98 slices shown, 12 images]
[im 13/98  soft-tissue]
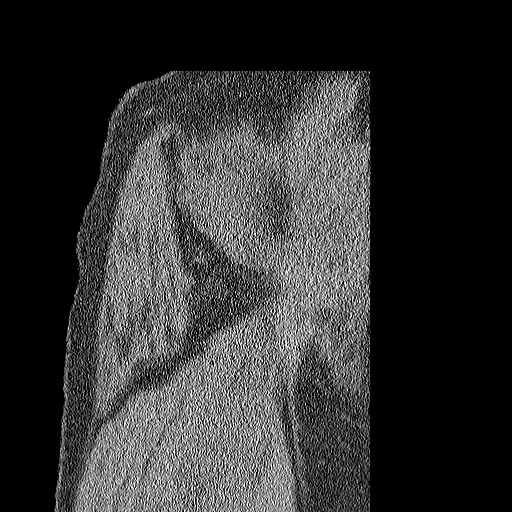
[im 13/98  lung]
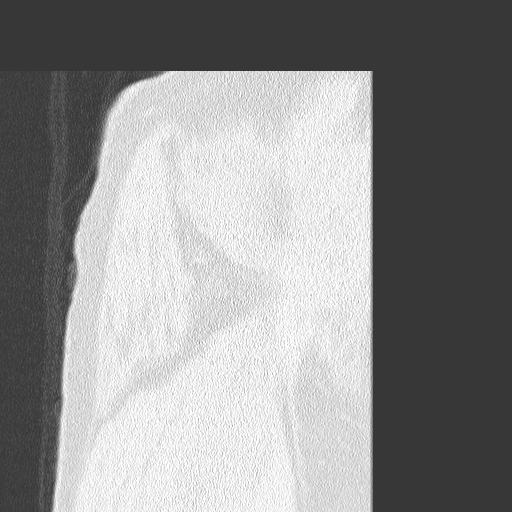
[im 13/98  bone]
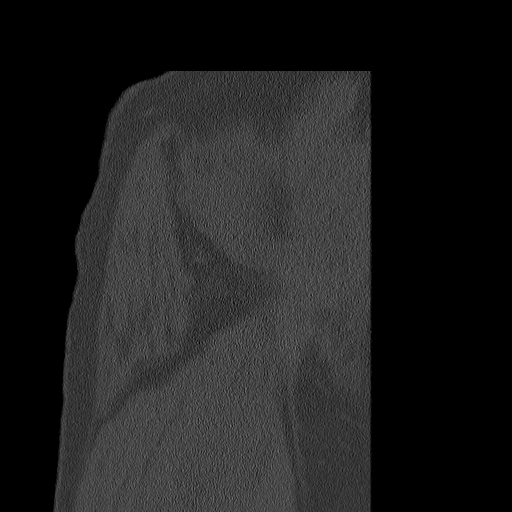
[im 25/98  soft-tissue]
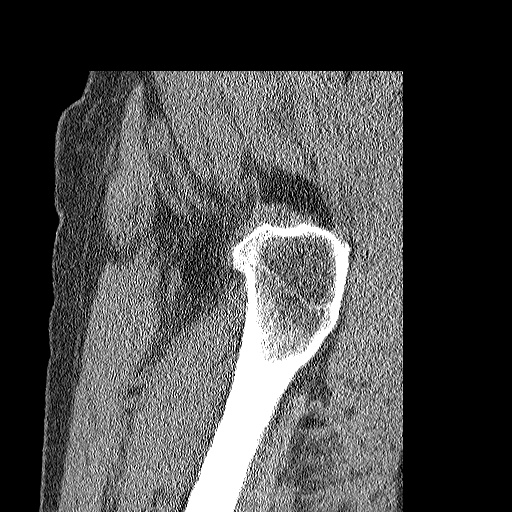
[im 25/98  lung]
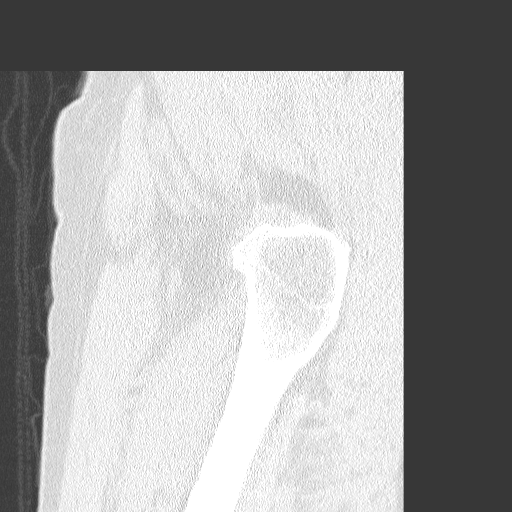
[im 37/98  soft-tissue]
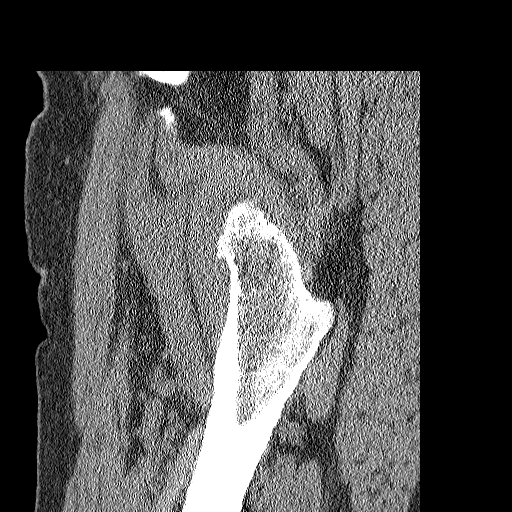
[im 37/98  lung]
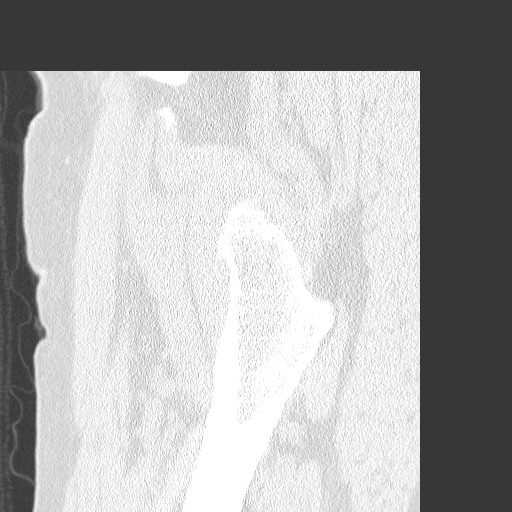
[im 49/98  soft-tissue]
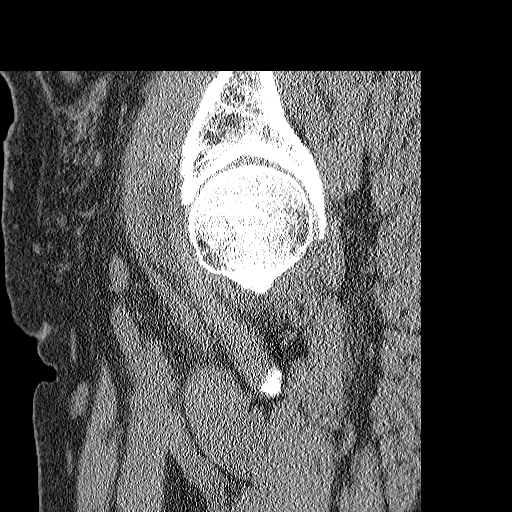
[im 49/98  lung]
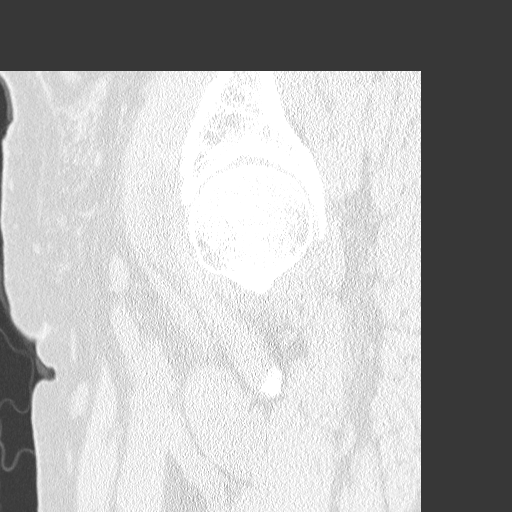
[im 61/98  soft-tissue]
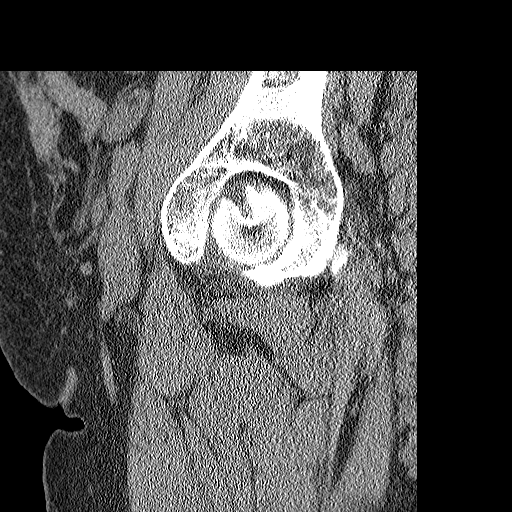
[im 73/98  soft-tissue]
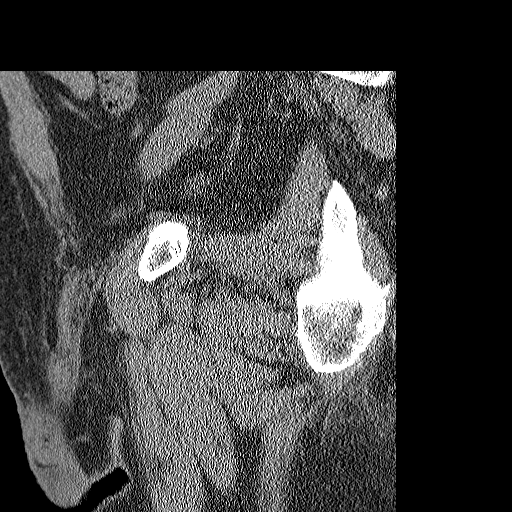
[im 85/98  soft-tissue]
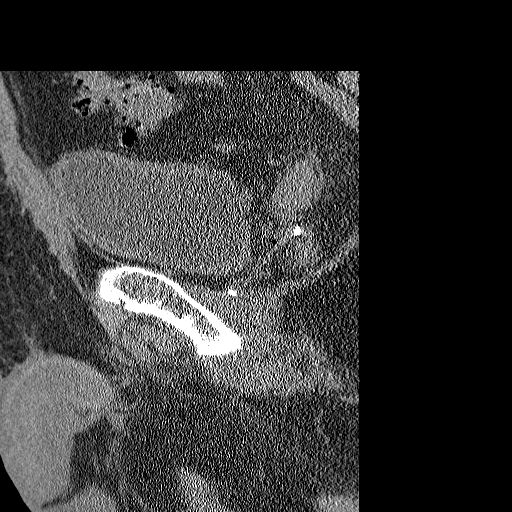

[14 of 32 positions shown; findings below may reference images not displayed]

FINDINGS: No fracture is identified.  The patient has some
degenerative disease about the right hip.  No focal osseous lesion
is seen.  There is no dislocation.  Visualized intrapelvic contents
demonstrate some diverticular disease of the sigmoid colon.
IMPRESSION: 1.  No CT evidence of stress fracture.  Please note that MRI is
more sensitive for the detection of stress fracture and stress
reaction. If the patient's pain continues, MRI is recommended for
further evaluation.
2.  Degenerative disease about the right hip.
3.  Diverticulosis.

## 2010-10-15 ENCOUNTER — Telehealth: Payer: Self-pay

## 2010-10-15 DIAGNOSIS — C61 Malignant neoplasm of prostate: Secondary | ICD-10-CM

## 2010-10-15 DIAGNOSIS — E785 Hyperlipidemia, unspecified: Secondary | ICD-10-CM

## 2010-10-15 DIAGNOSIS — E119 Type 2 diabetes mellitus without complications: Secondary | ICD-10-CM

## 2010-10-15 DIAGNOSIS — Z79899 Other long term (current) drug therapy: Secondary | ICD-10-CM | POA: Insufficient documentation

## 2010-10-15 DIAGNOSIS — D696 Thrombocytopenia, unspecified: Secondary | ICD-10-CM

## 2010-10-15 DIAGNOSIS — N259 Disorder resulting from impaired renal tubular function, unspecified: Secondary | ICD-10-CM

## 2010-10-15 NOTE — Telephone Encounter (Signed)
i ordered

## 2010-10-15 NOTE — Telephone Encounter (Signed)
Pt called requesting labs prior to appt 05/08,

## 2010-10-15 NOTE — Telephone Encounter (Signed)
Pt informed

## 2010-10-20 ENCOUNTER — Other Ambulatory Visit: Payer: Self-pay

## 2010-10-20 MED ORDER — SITAGLIPTIN PHOSPHATE 100 MG PO TABS: 50.0000 mg | ORAL_TABLET | Freq: Every day | ORAL | Status: DC

## 2010-10-26 ENCOUNTER — Telehealth: Payer: Self-pay | Admitting: Endocrinology

## 2010-10-26 NOTE — Telephone Encounter (Signed)
PA request for Januvia 100mg  Tab PA form requested & recvd from [place] @[phone]

## 2010-10-27 ENCOUNTER — Other Ambulatory Visit (INDEPENDENT_AMBULATORY_CARE_PROVIDER_SITE_OTHER): Payer: Medicare Other

## 2010-10-27 DIAGNOSIS — N259 Disorder resulting from impaired renal tubular function, unspecified: Secondary | ICD-10-CM

## 2010-10-27 DIAGNOSIS — E785 Hyperlipidemia, unspecified: Secondary | ICD-10-CM

## 2010-10-27 DIAGNOSIS — Z79899 Other long term (current) drug therapy: Secondary | ICD-10-CM

## 2010-10-27 DIAGNOSIS — C61 Malignant neoplasm of prostate: Secondary | ICD-10-CM

## 2010-10-27 DIAGNOSIS — D696 Thrombocytopenia, unspecified: Secondary | ICD-10-CM

## 2010-10-27 LAB — BASIC METABOLIC PANEL
CO2: 28 mEq/L (ref 19–32)
Calcium: 9.2 mg/dL (ref 8.4–10.5)
Potassium: 4.9 mEq/L (ref 3.5–5.1)
Sodium: 144 mEq/L (ref 135–145)

## 2010-10-27 LAB — CBC WITH DIFFERENTIAL/PLATELET
Basophils Relative: 0.3 % (ref 0.0–3.0)
Eosinophils Absolute: 0.3 10*3/uL (ref 0.0–0.7)
HCT: 40.5 % (ref 39.0–52.0)
Hemoglobin: 13.7 g/dL (ref 13.0–17.0)
Lymphocytes Relative: 25.8 % (ref 12.0–46.0)
Lymphs Abs: 2.9 10*3/uL (ref 0.7–4.0)
MCHC: 33.9 g/dL (ref 30.0–36.0)
Monocytes Relative: 11.6 % (ref 3.0–12.0)
Neutro Abs: 6.8 10*3/uL (ref 1.4–7.7)
RBC: 4.14 Mil/uL — ABNORMAL LOW (ref 4.22–5.81)

## 2010-10-27 LAB — LIPID PANEL
HDL: 33.5 mg/dL — ABNORMAL LOW (ref >39.00)
LDL Cholesterol: 54 mg/dL (ref 0–99)
Total CHOL/HDL Ratio: 3
Triglycerides: 106 mg/dL (ref 0.0–149.0)

## 2010-10-27 LAB — HEPATIC FUNCTION PANEL: Albumin: 4 g/dL (ref 3.5–5.2)

## 2010-10-29 NOTE — Telephone Encounter (Signed)
Completed PA form faxed for Approval [10/28/2010] Approval recvd and faxed to pharmacy @336 [phone] [10/28/10]

## 2010-11-03 ENCOUNTER — Encounter: Payer: Self-pay | Admitting: Endocrinology

## 2010-11-03 ENCOUNTER — Other Ambulatory Visit (INDEPENDENT_AMBULATORY_CARE_PROVIDER_SITE_OTHER): Payer: Medicare Other

## 2010-11-03 ENCOUNTER — Ambulatory Visit (INDEPENDENT_AMBULATORY_CARE_PROVIDER_SITE_OTHER): Payer: Medicare Other | Admitting: Endocrinology

## 2010-11-03 VITALS — BP 132/62 | HR 60 | Temp 97.2°F | Ht 73.0 in | Wt 204.2 lb

## 2010-11-03 DIAGNOSIS — E119 Type 2 diabetes mellitus without complications: Secondary | ICD-10-CM

## 2010-11-03 DIAGNOSIS — Z0000 Encounter for general adult medical examination without abnormal findings: Secondary | ICD-10-CM

## 2010-11-03 NOTE — Progress Notes (Signed)
Subjective:    Patient ID: [name], male    DOB: 10-Dec-1927, 77 y.o.   MRN: [id number]  HPI Subjective:   Patient here for Medicare annual wellness visit and management of other chronic and acute problems.     Risk factors: advanced age    71 of Physicians Providing Medical Care to Patient: Cardiol:cooper Opthal: [facility] dr Payton Mccallum: [facility]  Activities of Daily Living: In your present state of health, do you have any difficulty performing the following activities?:  Preparing food and eating?: No  Bathing yourself: No  Getting dressed: No  Using the toilet:No  Moving around from place to place: No  In the past year have you fallen or had a near fall?:No    Home Safety: Has smoke detector and wears seat belts. Has firearms. No excess sun exposure.  Diet and Exercise  Current exercise habits: very active Dietary issues discussed: pt reports healthy diet   Depression Screen  Q1: Over the past two weeks, have you felt down, depressed or hopeless?no  Q2: Over the past two weeks, have you felt little interest or pleasure in doing things? no   The following portions of the patient's history were reviewed and updated as appropriate: allergies, current medications, past family history, past medical history, past social history, past surgical history and problem list.   Review of Systems  Denies visual loss  Uses hearing aid Objective:   Vision:  Sees opthalmologist Hearing: grossly normal Body mass index: Msk: pt easily and quickly performs "get-up-and-go" from a sitting position Cognitive Impairment Assessment: cognition, memory and judgment appear normal.  remembers 3/3 at 5 minutes.  excellent recall.  can easily read and write a sentence.  alert and oriented x 3   Assessment:   Medicare wellness utd on preventive parameters    Plan:   During the course of the visit the patient was educated and counseled about appropriate screening and preventive services  including:       Fall prevention   Diabetes screening  Nutrition counseling   LABS PSA  Patient Instructions (the written plan) was given to the patient.   Past Medical History  Diagnosis Date  . PROSTATE CANCER 11/07/2007  . THYROID NODULE, RIGHT 11/13/2008  . DIABETES MELLITUS, TYPE II 02/04/2007  . HYPERLIPIDEMIA 12/04/2007  . UNSPECIFIED ANEMIA 11/07/2007  . THROMBOCYTOPENIA 04/17/2008  . DEPRESSION 02/04/2007  . OBSTRUCTIVE SLEEP APNEA 11/05/2009    -PSG 01/05/10 RDI 11, PLMI 56  . GLAUCOMA 03/08/2009  . HYPERTENSION 02/04/2007  . BRADYCARDIA 11/13/2008  . DIVERTICULOSIS, COLON 02/04/2007  . RENAL INSUFFICIENCY 11/13/2008  . OSTEOARTHRITIS 02/04/2007  . COLONIC POLYPS, HX OF 02/04/2007  . FOREIGN BODY, ASPIRATION 12/04/2007  . SYNCOPE 12/31/2009    Past Surgical History  Procedure Date  . Prostatectomy 1997  . Heart cartherization 2000  . Tonsillectomy 1940's    History   Social History  . Marital Status: Married    Spouse Name: N/A    Number of Children: N/A  . Years of Education: N/A   Occupational History  .      Retired   Social History Main Topics  . Smoking status: Former Smoker    Quit date: 07/08/1993  . Smokeless tobacco: Not on file  . Alcohol Use: No  . Drug Use: No  . Sexually Active:    Other Topics Concern  . Not on file   Social History Narrative   He plays tennis 3x a week    Current  Outpatient Prescriptions on File Prior to Visit  Medication Sig Dispense Refill  . aspirin 81 MG tablet Take 81 mg by mouth daily.        . Cholecalciferol (VITAMIN D) 2000 UNITS CAPS Take 1 capsule by mouth daily.        Marland Kitchen lisinopril (PRINIVIL,ZESTRIL) 40 MG tablet Take 40 mg by mouth daily.        . meloxicam (MOBIC) 7.5 MG tablet Take 7.5 mg by mouth daily.        . rosuvastatin (CRESTOR) 40 MG tablet Take 40 mg by mouth daily.        . sitaGLIPtan (JANUVIA) 100 MG tablet Take 0.5 tablets (50 mg total) by mouth daily.  45 tablet  1  . Thiamine HCl (VITAMIN B-1) 100  MG tablet Take 100 mg by mouth daily.        Marland Kitchen DISCONTD: citalopram (CELEXA) 20 MG tablet 1/2 tablet every other day       . DISCONTD: cyclobenzaprine (FLEXERIL) 10 MG tablet 1 tablet by mouth daily as needed for leg cramps       . DISCONTD: traMADol (ULTRAM) 50 MG tablet 1 tablet by mouth four times a day as needed for pain         Allergies  Allergen Reactions  . Sulfonamide Derivatives     REACTION: Itch/Red Spots    Family History  Problem Relation Age of Onset  . Kidney disease Father   . Heart disease Neg Hx     No FH of Coronary Artery Disease  . Cancer Neg Hx     BP 132/62  Pulse 60  Temp(Src) 97.2 F (36.2 C) (Oral)  Ht 6\' 1"  (1.854 m)  Wt 204 lb 3.2 oz (92.625 kg)  BMI 26.94 kg/m2  SpO2 99%       Review of Systems     Objective:   Physical Exam Pulses: dorsalis pedis intact bilat.   Feet: no deformity.  no ulcer on the feet.  feet are of normal color and temp.  no edema Neuro: sensation is intact to touch on the feet        Assessment & Plan:

## 2010-11-03 NOTE — Patient Instructions (Signed)
good diet and exercise habits significanly improve the control of your diabetes.  please let me know if you wish to be referred to a dietician.  high blood sugar is very risky to your health.  you should see an eye doctor every year. blood tests are being ordered for you today.  please call [phone] to hear your test results.  You will be prompted to enter the 9-digit "MRN" number that appears at the top left of this page, followed by #.  Then you will hear the message. pending the test results, please continue the same medications for now please consider these measures for your health:  minimize alcohol.  do not use tobacco products.  have a colonoscopy at least every 10 years from age 69.  keep firearms safely stored.  always use seat belts.  have working smoke alarms in your home.  see an eye doctor and dentist regularly.  never drive under the influence of alcohol or drugs (including prescription drugs).  those with fair skin should take precautions against the sun. please let me know what your wishes would be, if artificial life support measures should become necessary.  it is critically important to prevent falling down (keep floor areas well-lit, dry, and free of loose objects) Please make a follow-up appointment in 6 months

## 2010-11-07 DIAGNOSIS — Z0000 Encounter for general adult medical examination without abnormal findings: Secondary | ICD-10-CM | POA: Insufficient documentation

## 2010-11-10 NOTE — Assessment & Plan Note (Signed)
[place]                            CARDIOLOGY OFFICE NOTE   NAME:[name]                      MRN:          [id number]  DATE:03/31/2007                            DOB:          [birth year]    [name] was seen in followup at the [facility] Cardiology Office on  March 31, 2007.  He is a very nice 77 year old gentleman who has been  followed by [name] for many years.  I will be assuming his care  from here forward.  [name] has hypertension, diabetes and  hyperlipidemia.  He has had a coronary angiogram that showed normal  coronaries and he has had an echocardiogram as well as a stress Myoview  study, both demonstrating normal left ventricular function with no  evidence of ischemia and an EF in the range of 55% - 65%.   [name] continues to do well from a symptomatic standpoint.  He is  an active gentleman and has no symptoms with activity.  He specifically  denies chest pain, dyspnea, orthopnea, PND or edema.  He has tolerated  his medications without trouble.   CURRENT MEDICATIONS:  1. Aspirin 325 mg daily.  2. Lisinopril 45 mg daily.  3. Citalopram 10 mg every other day.  4. Crestor 40 mg daily.  5. Actos 45 mg every other day.   EXAMINATION:  He is alert and oriented, in no acute distress.  Weight is  212, blood pressure 120/60, heart rate is 57.  HEENT:  Normal.  NECK:  Normal carotid upstrokes without bruits, jugular venous pressure  is normal.  LUNGS:  Clear to auscultation bilaterally.  HEART:  Regular rate and rhythm without murmurs or gallops.  ABDOMEN:  Soft, nontender no organomegaly.  EXTREMITIES:  No clubbing, cyanosis or edema.   Labs from May 13 showed a hemoglobin A1c of 6.1, total cholesterol of  110 with an HDL of 34, an LDL of 61, his triglycerides were 72.   ASSESSMENT:  1. Hypertension.  Control is optimal on monotherapy with lisinopril.  2. Dyslipidemia.  LDL is at goal on Crestor 40 mg.  3. Type 2 diabetes.  Continued care per [name].   For followup I will see [name] back on a yearly basis.    Gerald Bond. Burt Knack, MD  Electronically Signed   MDC/MedQ  DD: 03/31/2007  DT: 04/01/2007  Job #: [id number]

## 2010-11-10 NOTE — Assessment & Plan Note (Signed)
[place]                            CARDIOLOGY OFFICE NOTE   NAME:[name]                      MRN:          [id number]  DATE:02/23/2008                            DOB:          1928-01-06    HISTORY OF PRESENT ILLNESS:  [name] was seen in followup at the  [facility] Cardiology Office on February 23, 2008.  He is a 77 year old  gentleman with hypertension, diabetes, and dyslipidemia.  He has no  history of coronary artery disease.  He underwent cardiac  catheterization in 2000.  His LV function has been normal with an  echocardiogram in 2006 showing an LVEF of 55-65%.   [name] remains very active.  He plays tennis 3 times per week and  walks for exercise at least twice per week.  He has no symptoms.  He  specifically denies chest pain, dyspnea, orthopnea, PND, or edema.  He  denies palpitations or syncope.   MEDICATIONS:  1. Aspirin 325 mg daily.  2. Lisinopril 40 mg daily.  3. Crestor 40 mg daily.  4. Actos 45 mg one-half daily.  5. Citalopram 20 mg every other day.  6. Vitamin D daily.   ALLERGIES:  SULFA.   PHYSICAL EXAMINATION:  GENERAL:  The patient is alert and oriented.  He  is in no acute distress.  He is a well-appearing elderly gentleman.  VITAL SIGNS:  Weight is 209 pounds, blood pressure 116/60, heart rate  58, respiratory rate 12.  HEENT:  Normal.  NECK:  Normal carotid upstrokes.  No bruits.  JVP normal.  LUNGS:  Clear bilaterally.  HEART:  Regular rate and rhythm.  No murmurs or gallops.  ABDOMEN:  Soft and nontender.  No organomegaly.  No bruits.  EXTREMITIES:  No clubbing, cyanosis, or edema.  Peripheral pulses are  intact and equal.   EKG shows sinus bradycardia and it was within normal limits.  There are  no significant ST-segment or T-wave changes.   ASSESSMENT:  1. Essential hypertension.  Optimal control on lisinopril.  2. Dyslipidemia.  The patient remains on Crestor 40 mg.  Lipids from  October showed a cholesterol of 110, triglycerides 91, HDL 30, and      LDL 61.  In the absence of coronary artery disease, I will be      inclined to continue his current therapy.  No indication to give      additional HDL-raising medication.  3. Other secondary risk reduction.  Recommended reduction in the      aspirin dose from 325 to 81 mg to lower long-term bleeding risk.   For followup, I will see [name] in 1 year.  I advised him to call  at any time if problems arise.     Juanda Bond. Burt Knack, MD  Electronically Signed    MDC/MedQ  DD: 02/23/2008  DT: 02/24/2008  Job #: [id number]   cc:   Hilliard Clark A. Loanne Drilling, MD

## 2010-11-10 NOTE — Assessment & Plan Note (Signed)
[name]                            CARDIOLOGY OFFICE NOTE   NAME:[name], [name]                      MRN:          [id number]  DATE:11/24/2006                            DOB:          1927/12/14    [name] is a very pleasant 77 year old white married male followed  here since 1969. He has a history of hypertension, diabetes, diabetic  neuropathy, hyperlipidemia, colon polyps, diverticulitis and prostate  cancer, having had a suprapubic prostatectomy at [facility] in 1997. In 2000,  he had a coronary angiogram revealing no coronary disease. In summer of  2006, he had an episode of syncope related to vigorous coughing. He has  had no recurrence.   An echocardiogram in October 2006, revealed a normal EF of 55-65% with  no wall motion abnormalities. Stress test in November 2006, revealed and  EF of 60% with no evidence of ischemia or infarction. The patient had  abdominal ultrasound in 2003 revealing no evidence of abdominal aortic  aneurysm.   The patient has no cardiac symptoms. He is followed for his diabetes by  Dr.  Loanne Richardson. Recent hemoglobin A1c was 6.1%. Recent LDL was 61, BUN was  27, creatinine 1.3. This was improved over the previous study six months  before.   His hematocrit is 37, hemoglobin 13.2, platelet count was a little low  at 122, monocytes 4.8.   MEDICATIONS:  1. Aspirin 325.  2. Lisinopril 40.  3. Citalopram 10 mg every other day.  4. Crestor 40.  5. Actos 45 every other day.   I should note that the patient does note easy bruisability.   PHYSICAL EXAMINATION:  Blood pressure 116/68, pulse 53, sinus  bradycardia.  GENERAL APPEARANCE: Normal. JVP is not elevated. Carotid pulses palpable  and equal without bruits.  LUNGS:  Clear.  CARDIAC:  No murmur, gallop, rub .  ABDOMEN: Normal.  EXTREMITIES: Appear normal.   EKG: Reveals sinus bradycardia, rare PVC, otherwise normal.   IMPRESSION:  Diagnoses as above.   PLAN:  Cardiac status is quite stable. I suggested continuing on the  same medication and followup with Dr.  Loanne Richardson also. Will have him see  Dr.  Sherren Richardson prior to his return to [place] in late October.     Gerald Kell, MD, [facility]  Electronically Signed    EJL/MedQ  DD: 11/24/2006  DT: 11/24/2006  Job #: [id number]

## 2010-11-13 NOTE — Assessment & Plan Note (Signed)
[place] OFFICE NOTE   NAME:[name], [name]                      MRN:          [id number]  DATE:04/27/2006                            DOB:          Mar 09, 1928    Cardiology followup note.   A very pleasant 77 year old white married male followed here since 1979. He  has a history of hypertension, diabetes, diabetic neuropathy,  hyperlipidemia, colon polyps, diverticulitis and prostate cancer, having had  a suprapubic prostatectomy at [facility] in 1997. In 2000, he had a coronary  angiography revealing no coronary disease. In the summer of 2006, he had an  episode of syncope probably related to vigorous coughing. He has had no  recurrence. An echocardiogram October 2006 revealed normal EF of 55 to 65%  with no wall motion abnormality. Stress test on May 06, 2005 revealed an  EF of 60% with no evidence of ischemia or infarction. The patient has been  getting along quite well. He has had no chest pain, palpitations, shortness  of breath. He remains quite active, lives in [place] during the winter and  actually is living in four days for [place].   He is on aspirin 325, Actos 45 half tablet a day, lisinopril 40, citalopram  10, Crestor 40, Zetia 10.   Dr. Loanne Drilling has seen him recently and is changing him from Crestor 40 and  Zetia 10 to Vytorin 10/40. His recent LDL was 42.   Blood pressure 114/63, pulse 56, normal sinus rhythm.  GENERAL APPEARANCE:  Normal.  His JVP is not elevated. Carotid pulses are palpable, full and equal without  bruits.  LUNGS:  Clear.  CARDIAC:  Normal.  ABDOMEN:  Normal.  EXTREMITIES:  Normal.   EKG is normal.   IMPRESSION:  1. Hypertension, controlled.  2. Hyperlipidemia, controlled.  3. History of one episode of syncope probably related to cough.   I suggest patient continue on the same therapy. We will see him back in 6  months or p.r.n.    ______________________________  E. Raymond Gurney, MD, [facility]    EJL/MedQ  DD: 04/27/2006  DT: 04/28/2006  Job #: [id number]

## 2010-12-07 ENCOUNTER — Other Ambulatory Visit: Payer: Self-pay | Admitting: Oncology

## 2010-12-07 ENCOUNTER — Encounter (HOSPITAL_BASED_OUTPATIENT_CLINIC_OR_DEPARTMENT_OTHER): Payer: Medicare Other | Admitting: Oncology

## 2010-12-07 DIAGNOSIS — I10 Essential (primary) hypertension: Secondary | ICD-10-CM

## 2010-12-07 DIAGNOSIS — N189 Chronic kidney disease, unspecified: Secondary | ICD-10-CM

## 2010-12-07 DIAGNOSIS — E119 Type 2 diabetes mellitus without complications: Secondary | ICD-10-CM

## 2010-12-07 DIAGNOSIS — E785 Hyperlipidemia, unspecified: Secondary | ICD-10-CM

## 2010-12-07 DIAGNOSIS — D6949 Other primary thrombocytopenia: Secondary | ICD-10-CM

## 2010-12-07 DIAGNOSIS — D696 Thrombocytopenia, unspecified: Secondary | ICD-10-CM

## 2010-12-07 LAB — COMPREHENSIVE METABOLIC PANEL
ALT: 18 U/L (ref 0–53)
Albumin: 4.3 g/dL (ref 3.5–5.2)
Alkaline Phosphatase: 58 U/L (ref 39–117)
CO2: 26 mEq/L (ref 19–32)
Glucose, Bld: 105 mg/dL — ABNORMAL HIGH (ref 70–99)
Potassium: 4.8 mEq/L (ref 3.5–5.3)
Sodium: 142 mEq/L (ref 135–145)
Total Protein: 6.3 g/dL (ref 6.0–8.3)

## 2010-12-07 LAB — CBC WITH DIFFERENTIAL/PLATELET
Basophils Absolute: 0 10*3/uL (ref 0.0–0.1)
EOS%: 2.8 % (ref 0.0–7.0)
HCT: 39.5 % (ref 38.4–49.9)
HGB: 13.4 g/dL (ref 13.0–17.1)
MCH: 32.9 pg (ref 27.2–33.4)
MCV: 97.2 fL (ref 79.3–98.0)
MONO%: 12.8 % (ref 0.0–14.0)
NEUT%: 59.6 % (ref 39.0–75.0)

## 2010-12-07 LAB — MORPHOLOGY

## 2010-12-10 ENCOUNTER — Telehealth: Payer: Self-pay | Admitting: *Deleted

## 2010-12-10 NOTE — Telephone Encounter (Addendum)
Pt wants MD's advisement on whether or not he should continue to take Crestor. Pt states that he has read that Crestor can have a negative affect on memory and concentration and he want to know if Crestor can be replaced with another drug-pt is aware that MD is out of office until Monday

## 2010-12-13 NOTE — Telephone Encounter (Signed)
please call patient: One one hand, the benefit of crestor massively outweighs any possible risk.  On the other hand, your cholesterol is excellent, and it would be fine to reduce to 20 mg qd, if you wish.

## 2010-12-14 NOTE — Telephone Encounter (Signed)
Pt's spouse advised but states opt has been experiencing memory loss, depression and some hyperactivity. Spouse advised to make appt to discuss full range of sxs with MD. Transferred to sch appt. Spouse will hold off and changing any medications until OV

## 2010-12-23 ENCOUNTER — Encounter: Payer: Self-pay | Admitting: Cardiovascular Disease

## 2011-01-20 ENCOUNTER — Ambulatory Visit (INDEPENDENT_AMBULATORY_CARE_PROVIDER_SITE_OTHER): Payer: Medicare Other | Admitting: Endocrinology

## 2011-01-20 ENCOUNTER — Encounter: Payer: Self-pay | Admitting: Endocrinology

## 2011-01-20 DIAGNOSIS — G4700 Insomnia, unspecified: Secondary | ICD-10-CM

## 2011-01-20 MED ORDER — ZOLPIDEM TARTRATE 5 MG PO TABS: 5.0000 mg | ORAL_TABLET | Freq: Every evening | ORAL | Status: DC | PRN

## 2011-01-20 MED ORDER — CITALOPRAM HYDROBROMIDE 20 MG PO TABS: 20.0000 mg | ORAL_TABLET | Freq: Every day | ORAL | Status: DC

## 2011-01-20 NOTE — Progress Notes (Signed)
  Subjective:    Patient ID: [name], male    DOB: [birth year], 77 y.o.   MRN: [id number]  HPI wife says pt has few mos of mild insomnia.  She says he also has associated depression. Past Medical History  Diagnosis Date  . PROSTATE CANCER 11/07/2007  . THYROID NODULE, RIGHT 11/13/2008  . DIABETES MELLITUS, TYPE II 02/04/2007  . HYPERLIPIDEMIA 12/04/2007  . UNSPECIFIED ANEMIA 11/07/2007  . THROMBOCYTOPENIA 04/17/2008  . DEPRESSION 02/04/2007  . OBSTRUCTIVE SLEEP APNEA 11/05/2009    -PSG 01/05/10 RDI 11, PLMI 56  . GLAUCOMA 03/08/2009  . HYPERTENSION 02/04/2007  . BRADYCARDIA 11/13/2008  . DIVERTICULOSIS, COLON 02/04/2007  . RENAL INSUFFICIENCY 11/13/2008  . OSTEOARTHRITIS 02/04/2007  . COLONIC POLYPS, HX OF 02/04/2007  . FOREIGN BODY, ASPIRATION 12/04/2007  . SYNCOPE 12/31/2009    Past Surgical History  Procedure Date  . Prostatectomy 1997  . Heart cartherization 2000  . Tonsillectomy 1940's    History   Social History  . Marital Status: Married    Spouse Name: N/A    Number of Children: N/A  . Years of Education: N/A   Occupational History  .      Retired   Social History Main Topics  . Smoking status: Former Smoker    Quit date: 07/08/1993  . Smokeless tobacco: Not on file  . Alcohol Use: No  . Drug Use: No  . Sexually Active:    Other Topics Concern  . Not on file   Social History Narrative   He plays tennis 3x a week    Current Outpatient Prescriptions on File Prior to Visit  Medication Sig Dispense Refill  . Cholecalciferol (VITAMIN D) 2000 UNITS CAPS Take 1 capsule by mouth daily.        Marland Kitchen lisinopril (PRINIVIL,ZESTRIL) 40 MG tablet Take 40 mg by mouth daily.        . meloxicam (MOBIC) 7.5 MG tablet Take 7.5 mg by mouth daily.        . rosuvastatin (CRESTOR) 40 MG tablet Take 40 mg by mouth daily.        . sitaGLIPtan (JANUVIA) 100 MG tablet Take 0.5 tablets (50 mg total) by mouth daily.  45 tablet  1  . Thiamine HCl (VITAMIN B-1) 100 MG tablet Take 100 mg by  mouth daily.        Marland Kitchen aspirin 81 MG tablet Take 81 mg by mouth daily.          Allergies  Allergen Reactions  . Sulfonamide Derivatives     REACTION: Itch/Red Spots    Family History  Problem Relation Age of Onset  . Kidney disease Father   . Heart disease Neg Hx     No FH of Coronary Artery Disease  . Cancer Neg Hx    BP 122/68  Pulse 60  Temp(Src) 97.5 F (36.4 C) (Oral)  Ht 6\' 1"  (1.854 m)  Wt 208 lb (94.348 kg)  BMI 27.44 kg/m2  SpO2 98%  Review of Systems He otherwise feels well.    Objective:   Physical Exam GENERAL: no distress PSYCH: Alert and oriented x 3.  Does not appear anxious nor depressed. Gait: normal and steady    outside test results are reviewed: i reviewed sleep study result with pr and wife.   Assessment & Plan:  Insomnia, new Depression, recurrent

## 2011-01-20 NOTE — Patient Instructions (Addendum)
i have sent a prescription to your pharmacy, for citalopram, 20 mg.  Call in 2 weeks if this does not help, to consider increasing to 40 mg. Call if you want to see sleep specialist. Here is a prescription for "ambien" to take at bedtime as needed for sleep.  If this works, call so i can prescribe a larger quantity.

## 2011-01-26 ENCOUNTER — Encounter: Payer: Self-pay | Admitting: Cardiovascular Disease

## 2011-02-02 ENCOUNTER — Ambulatory Visit (INDEPENDENT_AMBULATORY_CARE_PROVIDER_SITE_OTHER): Payer: Medicare Other | Admitting: Cardiovascular Disease

## 2011-02-02 ENCOUNTER — Encounter: Payer: Self-pay | Admitting: Cardiovascular Disease

## 2011-02-02 VITALS — BP 122/58 | HR 52 | Ht 72.0 in | Wt 207.0 lb

## 2011-02-02 DIAGNOSIS — E785 Hyperlipidemia, unspecified: Secondary | ICD-10-CM

## 2011-02-02 DIAGNOSIS — I10 Essential (primary) hypertension: Secondary | ICD-10-CM

## 2011-02-02 DIAGNOSIS — R55 Syncope and collapse: Secondary | ICD-10-CM

## 2011-02-02 MED ORDER — LISINOPRIL 20 MG PO TABS: 20.0000 mg | ORAL_TABLET | Freq: Every day | ORAL | Status: DC

## 2011-02-02 NOTE — Patient Instructions (Addendum)
Your physician recommends that you schedule a follow-up appointment in: 12 months with Dr. Burt Knack.  Your physician has recommended you make the following change in your medication: Decrease Lisinopril to 20 mg by mouth daily   Check your blood pressure daily and keep record. Please call us if your blood pressure is running greater than 140/90

## 2011-02-04 ENCOUNTER — Telehealth: Payer: Self-pay

## 2011-02-04 DIAGNOSIS — E785 Hyperlipidemia, unspecified: Secondary | ICD-10-CM

## 2011-02-04 DIAGNOSIS — C61 Malignant neoplasm of prostate: Secondary | ICD-10-CM

## 2011-02-04 DIAGNOSIS — E041 Nontoxic single thyroid nodule: Secondary | ICD-10-CM

## 2011-02-04 DIAGNOSIS — D696 Thrombocytopenia, unspecified: Secondary | ICD-10-CM

## 2011-02-04 DIAGNOSIS — N259 Disorder resulting from impaired renal tubular function, unspecified: Secondary | ICD-10-CM

## 2011-02-04 DIAGNOSIS — E119 Type 2 diabetes mellitus without complications: Secondary | ICD-10-CM

## 2011-02-04 NOTE — Telephone Encounter (Signed)
Pt called requesting labs prior to appt on 10/08.  Please advise.

## 2011-02-04 NOTE — Telephone Encounter (Signed)
i ordered

## 2011-02-04 NOTE — Telephone Encounter (Signed)
Pt informed

## 2011-02-18 ENCOUNTER — Encounter: Payer: Self-pay | Admitting: Cardiovascular Disease

## 2011-02-18 ENCOUNTER — Other Ambulatory Visit: Payer: Self-pay | Admitting: *Deleted

## 2011-02-18 MED ORDER — ZOLPIDEM TARTRATE 5 MG PO TABS: 5.0000 mg | ORAL_TABLET | Freq: Every evening | ORAL | Status: DC | PRN

## 2011-02-18 NOTE — Telephone Encounter (Signed)
R'cd fax from [facility] for refill of Zolpidem-last written 01/20/2011 #30 with 0 refills-please advise

## 2011-02-18 NOTE — Progress Notes (Signed)
HPI:  77 year-old male with history of syncope and hypertension presenting for follow-up evaluation. He is doing well at present. No syncope since his visit here one year ago. At that time he was evaluated with a carotid duplex and Holter monitor without significant abnormalities noted. He is active and plays tennis regularly. He denies chest pain, dyspnea, edema, palpitations, or lightheadedness.  Outpatient Encounter Prescriptions as of 02/02/2011  Medication Sig Dispense Refill  . aspirin 81 MG tablet Take 81 mg by mouth daily.        . Cholecalciferol (VITAMIN D) 2000 UNITS CAPS Take 1 capsule by mouth daily.        . citalopram (CELEXA) 20 MG tablet Take 1 tablet (20 mg total) by mouth daily.  30 tablet  11  . lisinopril (PRINIVIL,ZESTRIL) 20 MG tablet Take 1 tablet (20 mg total) by mouth daily.  30 tablet  11  . meloxicam (MOBIC) 7.5 MG tablet Take 7.5 mg by mouth daily.        . rosuvastatin (CRESTOR) 40 MG tablet Take 40 mg by mouth daily.        . sitaGLIPtan (JANUVIA) 100 MG tablet Take 0.5 tablets (50 mg total) by mouth daily.  45 tablet  1  . Thiamine HCl (VITAMIN B-1) 100 MG tablet Take 100 mg by mouth daily.        Marland Kitchen DISCONTD: lisinopril (PRINIVIL,ZESTRIL) 40 MG tablet Take 40 mg by mouth daily.        Marland Kitchen DISCONTD: zolpidem (AMBIEN) 5 MG tablet Take 1 tablet (5 mg total) by mouth at bedtime as needed for sleep.  30 tablet  0    Allergies  Allergen Reactions  . Sulfonamide Derivatives     REACTION: Itch/Red Spots    Past Medical History  Diagnosis Date  . PROSTATE CANCER 11/07/2007  . THYROID NODULE, RIGHT 11/13/2008  . DIABETES MELLITUS, TYPE II 02/04/2007  . HYPERLIPIDEMIA 12/04/2007  . UNSPECIFIED ANEMIA 11/07/2007  . THROMBOCYTOPENIA 04/17/2008  . DEPRESSION 02/04/2007  . OBSTRUCTIVE SLEEP APNEA 11/05/2009    -PSG 01/05/10 RDI 11, PLMI 56  . GLAUCOMA 03/08/2009  . HYPERTENSION 02/04/2007  . BRADYCARDIA 11/13/2008  . DIVERTICULOSIS, COLON 02/04/2007  . RENAL INSUFFICIENCY 11/13/2008    . OSTEOARTHRITIS 02/04/2007  . COLONIC POLYPS, HX OF 02/04/2007  . FOREIGN BODY, ASPIRATION 12/04/2007  . SYNCOPE 12/31/2009    ROS: Negative except as per HPI  BP 122/58  Pulse 52  Ht 6' (1.829 m)  Wt 207 lb (93.895 kg)  BMI 28.07 kg/m2  PHYSICAL EXAM: Pt is alert and oriented, elderly male appears younger than stated age, in NAD HEENT: normal Neck: JVP - normal, carotids 2+= without bruits Lungs: CTA bilaterally CV: RRR without murmur or gallop Abd: soft, NT, Positive BS, no hepatomegaly Ext: no C/C/E, distal pulses intact and equal Skin: warm/dry no rash  EKG:  Sinus brady 53 bpm, otherwise within normal limits.  ASSESSMENT AND PLAN:

## 2011-02-18 NOTE — Assessment & Plan Note (Signed)
Lipids at goal with LDL 54 on crestor 40 mg

## 2011-02-18 NOTE — Assessment & Plan Note (Signed)
Reports some low bp's at home. Will decrease lisinopril to 20 mg daily.

## 2011-02-18 NOTE — Telephone Encounter (Signed)
Rx faxed to pharmacy  

## 2011-02-18 NOTE — Assessment & Plan Note (Signed)
No recurrence of syncope. Advised keep well-hydrated especially with physical exertion.

## 2011-03-08 ENCOUNTER — Other Ambulatory Visit: Payer: Self-pay | Admitting: Oncology

## 2011-03-08 ENCOUNTER — Encounter (HOSPITAL_BASED_OUTPATIENT_CLINIC_OR_DEPARTMENT_OTHER): Payer: Medicare Other | Admitting: Oncology

## 2011-03-08 DIAGNOSIS — D6949 Other primary thrombocytopenia: Secondary | ICD-10-CM

## 2011-03-08 DIAGNOSIS — E785 Hyperlipidemia, unspecified: Secondary | ICD-10-CM

## 2011-03-08 DIAGNOSIS — E119 Type 2 diabetes mellitus without complications: Secondary | ICD-10-CM

## 2011-03-08 DIAGNOSIS — N189 Chronic kidney disease, unspecified: Secondary | ICD-10-CM

## 2011-03-08 LAB — CBC WITH DIFFERENTIAL/PLATELET
Basophils Absolute: 0 10*3/uL (ref 0.0–0.1)
EOS%: 1.4 % (ref 0.0–7.0)
HGB: 13.6 g/dL (ref 13.0–17.1)
MCH: 33 pg (ref 27.2–33.4)
MCV: 96.8 fL (ref 79.3–98.0)
MONO%: 9.7 % (ref 0.0–14.0)
RBC: 4.11 10*6/uL — ABNORMAL LOW (ref 4.20–5.82)
RDW: 14.1 % (ref 11.0–14.6)

## 2011-03-30 ENCOUNTER — Other Ambulatory Visit (INDEPENDENT_AMBULATORY_CARE_PROVIDER_SITE_OTHER): Payer: Medicare Other

## 2011-03-30 DIAGNOSIS — D696 Thrombocytopenia, unspecified: Secondary | ICD-10-CM

## 2011-03-30 DIAGNOSIS — E785 Hyperlipidemia, unspecified: Secondary | ICD-10-CM

## 2011-03-30 DIAGNOSIS — C61 Malignant neoplasm of prostate: Secondary | ICD-10-CM

## 2011-03-30 DIAGNOSIS — E041 Nontoxic single thyroid nodule: Secondary | ICD-10-CM

## 2011-03-30 DIAGNOSIS — N259 Disorder resulting from impaired renal tubular function, unspecified: Secondary | ICD-10-CM

## 2011-03-30 DIAGNOSIS — E119 Type 2 diabetes mellitus without complications: Secondary | ICD-10-CM

## 2011-03-30 LAB — BASIC METABOLIC PANEL
GFR: 46.25 mL/min — ABNORMAL LOW (ref >60.00)
Glucose, Bld: 92 mg/dL (ref 70–99)
Potassium: 4.6 mEq/L (ref 3.5–5.1)
Sodium: 143 mEq/L (ref 135–145)

## 2011-03-30 LAB — LIPID PANEL
Cholesterol: 125 mg/dL (ref 0–200)
Triglycerides: 90 mg/dL (ref 0.0–149.0)

## 2011-03-30 LAB — CBC WITH DIFFERENTIAL/PLATELET
Basophils Absolute: 0 10*3/uL (ref 0.0–0.1)
Eosinophils Absolute: 0.3 10*3/uL (ref 0.0–0.7)
Eosinophils Relative: 2.7 % (ref 0.0–5.0)
MCV: 97 fl (ref 78.0–100.0)
Monocytes Absolute: 1.2 10*3/uL — ABNORMAL HIGH (ref 0.1–1.0)
Neutrophils Relative %: 57.8 % (ref 43.0–77.0)
Platelets: 139 10*3/uL — ABNORMAL LOW (ref 150.0–400.0)
RDW: 14.1 % (ref 11.5–14.6)
WBC: 10.3 10*3/uL (ref 4.5–10.5)

## 2011-03-30 LAB — GLUCOSE, CAPILLARY: Glucose-Capillary: 71

## 2011-03-30 LAB — TSH: TSH: 3.11 u[IU]/mL (ref 0.35–5.50)

## 2011-03-31 ENCOUNTER — Other Ambulatory Visit: Payer: Self-pay | Admitting: Endocrinology

## 2011-04-01 ENCOUNTER — Telehealth: Payer: Self-pay | Admitting: Cardiovascular Disease

## 2011-04-01 DIAGNOSIS — I10 Essential (primary) hypertension: Secondary | ICD-10-CM

## 2011-04-01 LAB — HEMOGLOBIN A1C: Hgb A1c MFr Bld: 5.8 % (ref 4.6–6.5)

## 2011-04-01 NOTE — Telephone Encounter (Signed)
Pt wants refill of lisinipril called  To [facility]. He said he cuts pill in half please verify the dosage

## 2011-04-02 MED ORDER — LISINOPRIL 20 MG PO TABS: 20.0000 mg | ORAL_TABLET | Freq: Every day | ORAL | Status: DC

## 2011-04-02 NOTE — Telephone Encounter (Signed)
Pt would like a call back today °

## 2011-04-05 ENCOUNTER — Encounter: Payer: Self-pay | Admitting: Endocrinology

## 2011-04-05 ENCOUNTER — Ambulatory Visit (INDEPENDENT_AMBULATORY_CARE_PROVIDER_SITE_OTHER): Payer: Medicare Other | Admitting: Endocrinology

## 2011-04-05 VITALS — BP 122/58 | HR 57 | Temp 98.1°F | Ht 73.0 in | Wt 205.0 lb

## 2011-04-05 DIAGNOSIS — E119 Type 2 diabetes mellitus without complications: Secondary | ICD-10-CM

## 2011-04-05 DIAGNOSIS — Z23 Encounter for immunization: Secondary | ICD-10-CM

## 2011-04-05 DIAGNOSIS — I10 Essential (primary) hypertension: Secondary | ICD-10-CM

## 2011-04-05 MED ORDER — LISINOPRIL 40 MG PO TABS: ORAL_TABLET | ORAL | Status: DC

## 2011-04-05 NOTE — Patient Instructions (Addendum)
Please come back for a "medicare wellness" appointment in 6 months. Please continue the same medications.   Here is a letter to request a repeat thyroid ultrasound.  Please call the x-ray office in [place] when you get there.  Please ask that a copy be sent here for our records. (update: i left message on phone-tree:  rx as we discussed)

## 2011-04-05 NOTE — Telephone Encounter (Signed)
Pt still hasn't received call from Thursday of last week, pls call re refill of lisinopril, has question re dosage, pls call [phone]

## 2011-04-05 NOTE — Progress Notes (Signed)
Subjective:    Patient ID: [name], male    DOB: 02/22/1928, 77 y.o.   MRN: [id number]  HPI The state of at least three ongoing medical problems is addressed today: Dyslipidemia: pt states he feels well in general.  He takes crestor as rx'ed. DM: He tolerates the actos and [place] well. Thyroid nodule: this was noted a few years ago, in [place].  He does not notice it.   Past Medical History  Diagnosis Date  . PROSTATE CANCER 11/07/2007  . THYROID NODULE, RIGHT 11/13/2008  . DIABETES MELLITUS, TYPE II 02/04/2007  . HYPERLIPIDEMIA 12/04/2007  . UNSPECIFIED ANEMIA 11/07/2007  . THROMBOCYTOPENIA 04/17/2008  . DEPRESSION 02/04/2007  . OBSTRUCTIVE SLEEP APNEA 11/05/2009    -PSG 01/05/10 RDI 11, PLMI 56  . GLAUCOMA 03/08/2009  . HYPERTENSION 02/04/2007  . BRADYCARDIA 11/13/2008  . DIVERTICULOSIS, COLON 02/04/2007  . RENAL INSUFFICIENCY 11/13/2008  . OSTEOARTHRITIS 02/04/2007  . COLONIC POLYPS, HX OF 02/04/2007  . FOREIGN BODY, ASPIRATION 12/04/2007  . SYNCOPE 12/31/2009    Past Surgical History  Procedure Date  . Prostatectomy 1997  . Heart cartherization 2000  . Tonsillectomy 1940's    History   Social History  . Marital Status: Married    Spouse Name: N/A    Number of Children: N/A  . Years of Education: N/A   Occupational History  .      Retired   Social History Main Topics  . Smoking status: Former Smoker    Quit date: 07/08/1993  . Smokeless tobacco: Not on file  . Alcohol Use: No  . Drug Use: No  . Sexually Active:    Other Topics Concern  . Not on file   Social History Narrative   He plays tennis 3x a week    Current Outpatient Prescriptions on File Prior to Visit  Medication Sig Dispense Refill  . aspirin 81 MG tablet Take 81 mg by mouth daily.        . Cholecalciferol (VITAMIN D) 2000 UNITS CAPS Take 1 capsule by mouth daily.        . citalopram (CELEXA) 20 MG tablet Take 1 tablet (20 mg total) by mouth daily.  30 tablet  11  . meloxicam (MOBIC) 7.5 MG tablet  Take 7.5 mg by mouth daily.        . rosuvastatin (CRESTOR) 40 MG tablet Take 40 mg by mouth daily.        . sitaGLIPtan (JANUVIA) 100 MG tablet Take 0.5 tablets (50 mg total) by mouth daily.  45 tablet  1  . Thiamine HCl (VITAMIN B-1) 100 MG tablet Take 100 mg by mouth daily.        Marland Kitchen zolpidem (AMBIEN) 5 MG tablet Take 1 tablet (5 mg total) by mouth at bedtime as needed for sleep.  30 tablet  5    Allergies  Allergen Reactions  . Sulfonamide Derivatives     REACTION: Itch/Red Spots    Family History  Problem Relation Age of Onset  . Kidney disease Father   . Heart disease Neg Hx     No FH of Coronary Artery Disease  . Cancer Neg Hx     BP 122/58  Pulse 57  Temp(Src) 98.1 F (36.7 C) (Oral)  Ht 6\' 1"  (1.854 m)  Wt 205 lb (92.987 kg)  BMI 27.05 kg/m2  SpO2 99%  Review of Systems Denies chest pain and sob.      Objective:   Physical Exam VITAL SIGNS:  See  vs page GENERAL: no distress NECK: There is no palpable thyroid enlargement.  No thyroid nodule is palpable.  No palpable lymphadenopathy at the anterior neck. Pulses: dorsalis pedis intact bilat.   Feet: no deformity.  no ulcer on the feet.  feet are of normal color and temp.  no edema.  There is bilateral onychomycosis. Neuro: sensation is intact to touch on the feet   Lab Results  Component Value Date   HGBA1C 5.8 03/30/2011   Lab Results  Component Value Date   CHOL 125 03/30/2011   HDL 36.40* 03/30/2011   LDLCALC 71 03/30/2011   TRIG 90.0 03/30/2011   CHOLHDL 3 03/30/2011        Assessment & Plan:  Thyroid nodule. ? Any change Type 2 dm, well-controlled Dyslipidemia, well-controlled

## 2011-04-30 ENCOUNTER — Other Ambulatory Visit: Payer: Self-pay | Admitting: Endocrinology

## 2011-05-06 ENCOUNTER — Other Ambulatory Visit: Payer: Self-pay | Admitting: *Deleted

## 2011-05-06 MED ORDER — ROSUVASTATIN CALCIUM 40 MG PO TABS: 40.0000 mg | ORAL_TABLET | Freq: Every day | ORAL | Status: DC

## 2011-05-06 NOTE — Telephone Encounter (Signed)
R'cd fax from [place] in [facility] for refill of Crestor  Last OV-03/2011  Last filled-09/29/2010

## 2011-07-03 ENCOUNTER — Telehealth: Payer: Self-pay | Admitting: Oncology

## 2011-07-03 NOTE — Telephone Encounter (Signed)
L/m with 3/11 and 6/10 appt,also mailed  aom

## 2011-07-13 DIAGNOSIS — E040 Nontoxic diffuse goiter: Secondary | ICD-10-CM | POA: Diagnosis not present

## 2011-07-24 ENCOUNTER — Telehealth: Payer: Self-pay | Admitting: Oncology

## 2011-07-24 NOTE — Telephone Encounter (Signed)
Had mailed march and June sch to pt and it was ret. [place] from post office stating "ret to sender,temporarily away"   aom

## 2011-07-30 ENCOUNTER — Encounter: Payer: Self-pay | Admitting: Endocrinology

## 2011-08-11 ENCOUNTER — Other Ambulatory Visit: Payer: Self-pay | Admitting: *Deleted

## 2011-08-11 MED ORDER — ZOLPIDEM TARTRATE 5 MG PO TABS: 5.0000 mg | ORAL_TABLET | Freq: Every evening | ORAL | Status: DC | PRN

## 2011-08-11 NOTE — Telephone Encounter (Signed)
Rx faxed to [facility].

## 2011-08-11 NOTE — Telephone Encounter (Signed)
i printed 

## 2011-08-11 NOTE — Telephone Encounter (Signed)
R'cd fax from [place] for refill of Zolpidem-last written 02/18/2011 #30 with 5 refills-please advise.

## 2011-09-06 ENCOUNTER — Other Ambulatory Visit: Payer: Medicare Other

## 2011-09-06 ENCOUNTER — Telehealth: Payer: Self-pay | Admitting: Oncology

## 2011-09-06 NOTE — Telephone Encounter (Signed)
pt called and stated that he is out of town and will be back in june. cancelled appt for 03/11 pt will keep appt for 06/10

## 2011-09-22 ENCOUNTER — Other Ambulatory Visit: Payer: Self-pay | Admitting: Endocrinology

## 2011-09-22 DIAGNOSIS — C61 Malignant neoplasm of prostate: Secondary | ICD-10-CM

## 2011-09-22 DIAGNOSIS — E119 Type 2 diabetes mellitus without complications: Secondary | ICD-10-CM

## 2011-09-22 DIAGNOSIS — D696 Thrombocytopenia, unspecified: Secondary | ICD-10-CM

## 2011-09-22 DIAGNOSIS — Z79899 Other long term (current) drug therapy: Secondary | ICD-10-CM

## 2011-09-22 DIAGNOSIS — N259 Disorder resulting from impaired renal tubular function, unspecified: Secondary | ICD-10-CM

## 2011-10-07 DIAGNOSIS — R198 Other specified symptoms and signs involving the digestive system and abdomen: Secondary | ICD-10-CM | POA: Diagnosis not present

## 2011-10-11 DIAGNOSIS — R198 Other specified symptoms and signs involving the digestive system and abdomen: Secondary | ICD-10-CM | POA: Diagnosis not present

## 2011-10-21 DIAGNOSIS — K5289 Other specified noninfective gastroenteritis and colitis: Secondary | ICD-10-CM | POA: Diagnosis not present

## 2011-11-09 ENCOUNTER — Other Ambulatory Visit: Payer: Self-pay | Admitting: *Deleted

## 2011-11-09 MED ORDER — LISINOPRIL 40 MG PO TABS: ORAL_TABLET | ORAL | Status: DC

## 2011-11-09 NOTE — Telephone Encounter (Signed)
R'cd fax from [place] for refill of Lisinopril

## 2011-11-16 ENCOUNTER — Other Ambulatory Visit (INDEPENDENT_AMBULATORY_CARE_PROVIDER_SITE_OTHER): Payer: Medicare Other

## 2011-11-16 DIAGNOSIS — D696 Thrombocytopenia, unspecified: Secondary | ICD-10-CM | POA: Diagnosis not present

## 2011-11-16 DIAGNOSIS — E119 Type 2 diabetes mellitus without complications: Secondary | ICD-10-CM

## 2011-11-16 DIAGNOSIS — Z79899 Other long term (current) drug therapy: Secondary | ICD-10-CM | POA: Diagnosis not present

## 2011-11-16 DIAGNOSIS — C61 Malignant neoplasm of prostate: Secondary | ICD-10-CM

## 2011-11-16 DIAGNOSIS — N259 Disorder resulting from impaired renal tubular function, unspecified: Secondary | ICD-10-CM | POA: Diagnosis not present

## 2011-11-16 LAB — CBC WITH DIFFERENTIAL/PLATELET
Eosinophils Relative: 2.8 % (ref 0.0–5.0)
HCT: 40 % (ref 39.0–52.0)
Lymphs Abs: 2.8 10*3/uL (ref 0.7–4.0)
MCHC: 33.2 g/dL (ref 30.0–36.0)
MCV: 98.2 fl (ref 78.0–100.0)
Monocytes Absolute: 1.2 10*3/uL — ABNORMAL HIGH (ref 0.1–1.0)
Platelets: 118 10*3/uL — ABNORMAL LOW (ref 150.0–400.0)
WBC: 8.9 10*3/uL (ref 4.5–10.5)

## 2011-11-16 LAB — HEPATIC FUNCTION PANEL
ALT: 25 U/L (ref 0–53)
Total Bilirubin: 0.7 mg/dL (ref 0.3–1.2)
Total Protein: 6.3 g/dL (ref 6.0–8.3)

## 2011-11-16 LAB — BASIC METABOLIC PANEL
BUN: 36 mg/dL — ABNORMAL HIGH (ref 6–23)
CO2: 27 mEq/L (ref 19–32)
Chloride: 111 mEq/L (ref 96–112)
Glucose, Bld: 75 mg/dL (ref 70–99)
Potassium: 4.9 mEq/L (ref 3.5–5.1)

## 2011-11-16 LAB — PSA: PSA: 0 ng/mL — ABNORMAL LOW (ref 0.10–4.00)

## 2011-11-23 ENCOUNTER — Ambulatory Visit (INDEPENDENT_AMBULATORY_CARE_PROVIDER_SITE_OTHER): Payer: Medicare Other | Admitting: Endocrinology

## 2011-11-23 ENCOUNTER — Encounter: Payer: Self-pay | Admitting: Endocrinology

## 2011-11-23 DIAGNOSIS — E785 Hyperlipidemia, unspecified: Secondary | ICD-10-CM

## 2011-11-23 DIAGNOSIS — H902 Conductive hearing loss, unspecified: Secondary | ICD-10-CM

## 2011-11-23 DIAGNOSIS — N259 Disorder resulting from impaired renal tubular function, unspecified: Secondary | ICD-10-CM

## 2011-11-23 DIAGNOSIS — D649 Anemia, unspecified: Secondary | ICD-10-CM | POA: Diagnosis not present

## 2011-11-23 DIAGNOSIS — D696 Thrombocytopenia, unspecified: Secondary | ICD-10-CM

## 2011-11-23 DIAGNOSIS — C61 Malignant neoplasm of prostate: Secondary | ICD-10-CM

## 2011-11-23 DIAGNOSIS — I10 Essential (primary) hypertension: Secondary | ICD-10-CM

## 2011-11-23 DIAGNOSIS — E119 Type 2 diabetes mellitus without complications: Secondary | ICD-10-CM | POA: Diagnosis not present

## 2011-11-23 DIAGNOSIS — Z79899 Other long term (current) drug therapy: Secondary | ICD-10-CM

## 2011-11-23 DIAGNOSIS — E041 Nontoxic single thyroid nodule: Secondary | ICD-10-CM

## 2011-11-23 NOTE — Patient Instructions (Addendum)
Please come back for a "medicare wellness" appointment in approx 5 months. Please continue the same medications.

## 2011-11-23 NOTE — Progress Notes (Signed)
Subjective:    Patient ID: [name], male    DOB: [birth year], 78 y.o.   MRN: [id number]  HPI The state of at least three ongoing medical problems is addressed today:  DM.  Pt says diet and exercise are good.  He has lost a few lbs, due to his efforts. Renal insuff is chronic.  He denies hematuria Thrombocytopenia.  He denies brbpr Past Medical History  Diagnosis Date  . PROSTATE CANCER 11/07/2007  . THYROID NODULE, RIGHT 11/13/2008  . DIABETES MELLITUS, TYPE II 02/04/2007  . HYPERLIPIDEMIA 12/04/2007  . UNSPECIFIED ANEMIA 11/07/2007  . THROMBOCYTOPENIA 04/17/2008  . DEPRESSION 02/04/2007  . OBSTRUCTIVE SLEEP APNEA 11/05/2009    -PSG 01/05/10 RDI 11, PLMI 56  . GLAUCOMA 03/08/2009  . HYPERTENSION 02/04/2007  . BRADYCARDIA 11/13/2008  . DIVERTICULOSIS, COLON 02/04/2007  . RENAL INSUFFICIENCY 11/13/2008  . OSTEOARTHRITIS 02/04/2007  . COLONIC POLYPS, HX OF 02/04/2007  . FOREIGN BODY, ASPIRATION 12/04/2007  . SYNCOPE 12/31/2009    Past Surgical History  Procedure Date  . Prostatectomy 1997  . Heart cartherization 2000  . Tonsillectomy 1940's    History   Social History  . Marital Status: Married    Spouse Name: N/A    Number of Children: N/A  . Years of Education: N/A   Occupational History  .      Retired   Social History Main Topics  . Smoking status: Former Smoker    Quit date: 07/08/1993  . Smokeless tobacco: Not on file  . Alcohol Use: No  . Drug Use: No  . Sexually Active:    Other Topics Concern  . Not on file   Social History Narrative   He plays tennis 3x a week    Current Outpatient Prescriptions on File Prior to Visit  Medication Sig Dispense Refill  . aspirin 81 MG tablet Take 81 mg by mouth daily.        . Cholecalciferol (VITAMIN D) 2000 UNITS CAPS Take 1 capsule by mouth daily.        . citalopram (CELEXA) 20 MG tablet Take 1 tablet (20 mg total) by mouth daily.  30 tablet  11  . JANUVIA 100 MG tablet TAKE 1/2 TABLET BY MOUTH DAILY  45 tablet  2  .  lisinopril (PRINIVIL,ZESTRIL) 40 MG tablet 1/2 tab daily  30 tablet  3  . rosuvastatin (CRESTOR) 40 MG tablet Take 1 tablet (40 mg total) by mouth daily.  30 tablet  11  . Thiamine HCl (VITAMIN B-1) 100 MG tablet Take 100 mg by mouth daily.        Marland Kitchen zolpidem (AMBIEN) 5 MG tablet Take 1 tablet (5 mg total) by mouth at bedtime as needed for sleep.  30 tablet  5    Allergies  Allergen Reactions  . Sulfonamide Derivatives     REACTION: Itch/Red Spots    Family History  Problem Relation Age of Onset  . Kidney disease Father   . Heart disease Neg Hx     No FH of Coronary Artery Disease  . Cancer Neg Hx     BP 110/58  Pulse 56  Temp(Src) 97.3 F (36.3 C) (Oral)  Ht 6\' 1"  (1.854 m)  Wt 197 lb (89.359 kg)  BMI 25.99 kg/m2  SpO2 95%  Review of Systems  Respiratory: Negative for shortness of breath.   Cardiovascular: Negative for chest pain.       Objective:   Physical Exam VITAL SIGNS:  See vs page. GENERAL:  no distress. Pulses: dorsalis pedis intact bilat.   Feet: no deformity.  no ulcer on the feet.  feet are of normal color and temp.  no edema.  There is bilateral onychomycosis.   Neuro: sensation is intact to touch on the feet.     Lab Results  Component Value Date   WBC 8.9 11/16/2011   HGB 13.3 11/16/2011   HCT 40.0 11/16/2011   PLT 118.0* 11/16/2011   GLUCOSE 75 11/16/2011   CHOL 125 03/30/2011   TRIG 90.0 03/30/2011   HDL 36.40* 03/30/2011   LDLCALC 71 03/30/2011   ALT 25 11/16/2011   AST 31 11/16/2011   NA 144 11/16/2011   K 4.9 11/16/2011   CL 111 11/16/2011   CREATININE 1.8* 11/16/2011   BUN 36* 11/16/2011   CO2 27 11/16/2011   TSH 3.11 03/30/2011   PSA 0.00* 11/16/2011   HGBA1C 5.5 11/16/2011   MICROALBUR 5.9* 11/03/2009      Assessment & Plan:  DM.  well-controlled Renal insuff stable Thrombocytopenia, stable

## 2011-11-28 ENCOUNTER — Other Ambulatory Visit: Payer: Self-pay | Admitting: Endocrinology

## 2011-11-28 DIAGNOSIS — E119 Type 2 diabetes mellitus without complications: Secondary | ICD-10-CM

## 2011-11-28 DIAGNOSIS — Z79899 Other long term (current) drug therapy: Secondary | ICD-10-CM

## 2011-11-28 DIAGNOSIS — Z0000 Encounter for general adult medical examination without abnormal findings: Secondary | ICD-10-CM

## 2011-11-28 DIAGNOSIS — D649 Anemia, unspecified: Secondary | ICD-10-CM

## 2011-11-28 DIAGNOSIS — E785 Hyperlipidemia, unspecified: Secondary | ICD-10-CM

## 2011-11-28 DIAGNOSIS — R609 Edema, unspecified: Secondary | ICD-10-CM

## 2011-11-28 DIAGNOSIS — C61 Malignant neoplasm of prostate: Secondary | ICD-10-CM

## 2011-11-28 DIAGNOSIS — D696 Thrombocytopenia, unspecified: Secondary | ICD-10-CM

## 2011-11-28 DIAGNOSIS — N259 Disorder resulting from impaired renal tubular function, unspecified: Secondary | ICD-10-CM

## 2011-11-28 DIAGNOSIS — I10 Essential (primary) hypertension: Secondary | ICD-10-CM

## 2011-11-30 ENCOUNTER — Ambulatory Visit (INDEPENDENT_AMBULATORY_CARE_PROVIDER_SITE_OTHER): Payer: Medicare Other | Admitting: Family Medicine

## 2011-11-30 ENCOUNTER — Encounter: Payer: Self-pay | Admitting: Family Medicine

## 2011-11-30 VITALS — BP 129/69 | HR 54 | Ht 72.0 in | Wt 195.0 lb

## 2011-11-30 DIAGNOSIS — M79609 Pain in unspecified limb: Secondary | ICD-10-CM

## 2011-11-30 DIAGNOSIS — M79673 Pain in unspecified foot: Secondary | ICD-10-CM | POA: Insufficient documentation

## 2011-11-30 DIAGNOSIS — M79671 Pain in right foot: Secondary | ICD-10-CM

## 2011-11-30 NOTE — Progress Notes (Addendum)
Patient ID: [name], male   DOB: 1928/04/07, 78 y.o.   MRN: [id number]  CC: Right heel pain  HPI: [name] is a very pleasant 78 year old male who comes in with a 5 week history of pain that he localizes to the medial and lateral aspects of his right calcaneus. He plays tennis approximately 3-5 times a week, and notes this is particularly bad afterwards. He denies any pain in the Achilles, and denies any pain in the plantar aspect of his right heel. The pain has no radiation, and is sharp in nature. He has not yet tried any oral analgesics.   Past medical history, surgical history, family history, social history, allergies, and medications reviewed from the medical record and no changes needed.  Review of Systems: No fevers, chills, night sweats, weight loss, chest pain, or shortness of breath.    Objective:  General:  Well Developed, well nourished, and in no acute distress. Neuro:  Alert and oriented x3, extra-ocular muscles intact. Skin: Warm and dry, no rashes noted. Respiratory:  Not using accessory muscles, speaking in full sentences. Musculoskeletal: Right Ankle: No visible erythema or swelling. Range of motion is full in all directions. Strength is 5/5 in all directions. Stable lateral and medial ligaments; squeeze test and kleiger test unremarkable; Talar dome nontender; No pain at base of 5th MT; No tenderness over cuboid; No tenderness over N spot or navicular prominence No tenderness on posterior aspects of lateral and medial malleolus No sign of peroneal tendon subluxations or tenderness to palpation Negative tarsal tunnel tinel's  Tender to palpation on the medial and lateral aspects of the calcaneus. No tenderness over the Achilles, no nodules palpable. No tenderness over the calcaneal bursa, or retrocalcaneal bursa. Leg lengths are equal. No tenderness to palpation the calcaneal origin of the plantar fascia.  MSK ultrasound: Shows a normal-appearing Achilles,  with no sign of calcaneal or retrocalcaneal bursitis. Images saved.  Assessment & Plan:

## 2011-11-30 NOTE — Assessment & Plan Note (Addendum)
Suspect calcaneal contusion. No suspicion at this time for Achilles tendinosis, plantar fasciitis, calcaneal or retrocalcaneal bursitis. Sports insoles with heel lifts. HEP (achilles). Acetaminophen prn (avoiding NSAIDs due to renal insufficiency). RTC 3-4 weeks.

## 2011-12-03 ENCOUNTER — Telehealth: Payer: Self-pay | Admitting: Oncology

## 2011-12-03 NOTE — Telephone Encounter (Signed)
called pt and informed him that his appt time for 06/10 was moved to 2:45pm

## 2011-12-06 ENCOUNTER — Ambulatory Visit: Payer: Medicare Other | Admitting: Oncology

## 2011-12-06 ENCOUNTER — Other Ambulatory Visit: Payer: Medicare Other | Admitting: Lab

## 2011-12-06 ENCOUNTER — Encounter: Payer: Self-pay | Admitting: Oncology

## 2011-12-06 ENCOUNTER — Ambulatory Visit (HOSPITAL_BASED_OUTPATIENT_CLINIC_OR_DEPARTMENT_OTHER): Payer: Medicare Other | Admitting: Oncology

## 2011-12-06 ENCOUNTER — Other Ambulatory Visit (HOSPITAL_BASED_OUTPATIENT_CLINIC_OR_DEPARTMENT_OTHER): Payer: Medicare Other | Admitting: Lab

## 2011-12-06 VITALS — BP 113/64 | HR 58 | Temp 97.6°F | Ht 72.0 in | Wt 199.3 lb

## 2011-12-06 DIAGNOSIS — D696 Thrombocytopenia, unspecified: Secondary | ICD-10-CM | POA: Diagnosis not present

## 2011-12-06 DIAGNOSIS — E119 Type 2 diabetes mellitus without complications: Secondary | ICD-10-CM

## 2011-12-06 DIAGNOSIS — E785 Hyperlipidemia, unspecified: Secondary | ICD-10-CM | POA: Diagnosis not present

## 2011-12-06 DIAGNOSIS — D6949 Other primary thrombocytopenia: Secondary | ICD-10-CM | POA: Diagnosis not present

## 2011-12-06 DIAGNOSIS — N189 Chronic kidney disease, unspecified: Secondary | ICD-10-CM | POA: Diagnosis not present

## 2011-12-06 DIAGNOSIS — I10 Essential (primary) hypertension: Secondary | ICD-10-CM | POA: Diagnosis not present

## 2011-12-06 LAB — CBC WITH DIFFERENTIAL/PLATELET
Basophils Absolute: 0 10*3/uL (ref 0.0–0.1)
EOS%: 3.3 % (ref 0.0–7.0)
HCT: 39 % (ref 38.4–49.9)
HGB: 13 g/dL (ref 13.0–17.1)
MCH: 32.9 pg (ref 27.2–33.4)
MCV: 98.2 fL — ABNORMAL HIGH (ref 79.3–98.0)
MONO%: 13.8 % (ref 0.0–14.0)
NEUT%: 55.6 % (ref 39.0–75.0)
lymph#: 2.9 10*3/uL (ref 0.9–3.3)

## 2011-12-06 LAB — COMPREHENSIVE METABOLIC PANEL
Albumin: 3.9 g/dL (ref 3.5–5.2)
Alkaline Phosphatase: 48 U/L (ref 39–117)
BUN: 38 mg/dL — ABNORMAL HIGH (ref 6–23)
CO2: 26 mEq/L (ref 19–32)
Calcium: 9.2 mg/dL (ref 8.4–10.5)
Glucose, Bld: 96 mg/dL (ref 70–99)
Potassium: 5.4 mEq/L — ABNORMAL HIGH (ref 3.5–5.3)

## 2011-12-06 NOTE — Progress Notes (Signed)
[place]  [phone] [phone]   OFFICE PROGRESS NOTE   Cc:  Renato Shin, MD, MD  DIAGNOSIS: Mild chronic thrombocytopenia; not otherwise specified.  CURRENT THERAPY: Watchful observation.  INTERVAL HISTORY: [name] 78 y.o. male returns for regular follow-up.  He reports that he feels well.  He is very active and plays tennis regularly.  He denies any visible source of bleeding such as epistaxis, gum bleeding, hemoptysis, hematemesis, melena, hematochezia, hematuria, ecchymosis.  He does have some ecchymosis on the extensor surface of his hands; however, this is from he thinks aspirin going on for many years.  He denies any myalgias, arthralgia, constipation, diarrhea, abdominal pain, abdominal swelling, jaundice, heat or cold intolerance, depression.    Past Medical History  Diagnosis Date  . PROSTATE CANCER 11/07/2007  . THYROID NODULE, RIGHT 11/13/2008  . DIABETES MELLITUS, TYPE II 02/04/2007  . HYPERLIPIDEMIA 12/04/2007  . UNSPECIFIED ANEMIA 11/07/2007  . THROMBOCYTOPENIA 04/17/2008  . DEPRESSION 02/04/2007  . OBSTRUCTIVE SLEEP APNEA 11/05/2009    -PSG 01/05/10 RDI 11, PLMI 56  . GLAUCOMA 03/08/2009  . HYPERTENSION 02/04/2007  . BRADYCARDIA 11/13/2008  . DIVERTICULOSIS, COLON 02/04/2007  . RENAL INSUFFICIENCY 11/13/2008  . OSTEOARTHRITIS 02/04/2007  . COLONIC POLYPS, HX OF 02/04/2007  . FOREIGN BODY, ASPIRATION 12/04/2007  . SYNCOPE 12/31/2009    Past Surgical History  Procedure Date  . Prostatectomy 1997  . Heart cartherization 2000  . Tonsillectomy 1940's    Current Outpatient Prescriptions  Medication Sig Dispense Refill  . aspirin 81 MG tablet Take 81 mg by mouth daily.        . Cholecalciferol (VITAMIN D) 2000 UNITS CAPS Take 1 capsule by mouth daily.        . citalopram (CELEXA) 20 MG tablet Take 1 tablet (20 mg total) by mouth daily.  30 tablet  11  . JANUVIA 100 MG tablet TAKE 1/2 TABLET BY MOUTH DAILY  45 tablet  2  .  lisinopril (PRINIVIL,ZESTRIL) 40 MG tablet 40 mg. 1/2 tab daily      . rosuvastatin (CRESTOR) 40 MG tablet Take 1 tablet (40 mg total) by mouth daily.  30 tablet  11  . Thiamine HCl (VITAMIN B-1) 100 MG tablet Take 100 mg by mouth daily.        Marland Kitchen zolpidem (AMBIEN) 5 MG tablet Take 1 tablet (5 mg total) by mouth at bedtime as needed for sleep.  30 tablet  5    ALLERGIES:  is allergic to sulfonamide derivatives.  REVIEW OF SYSTEMS:  The rest of the 14-point review of system was negative.   Filed Vitals:   12/06/11 1519  BP: 113/64  Pulse: 58  Temp: 97.6 F (36.4 C)   Wt Readings from Last 3 Encounters:  12/06/11 199 lb 4.8 oz (90.402 kg)  11/30/11 195 lb (88.451 kg)  11/23/11 197 lb (89.359 kg)   ECOG Performance status: 0  PHYSICAL EXAMINATION: General:  well-nourished in no acute distress.  Eyes:  no scleral icterus.  ENT:  There were no oropharyngeal lesions.  Neck was without thyromegaly.  Lymphatics:  Negative cervical, supraclavicular or axillary adenopathy.  Respiratory: lungs were clear bilaterally without wheezing or crackles.  Cardiovascular:  Regular rate and rhythm, S1/S2, without murmur, rub or gallop.  There was no pedal edema.  GI:  abdomen was soft, flat, nontender, nondistended, without organomegaly.  Muscoloskeletal:  no spinal tenderness of palpation of vertebral spine.  Skin exam was without echymosis, petichae.  Neuro  exam was nonfocal.  Patient was able to get on and off exam table without assistance.  Gait was normal.  Patient was alerted and oriented.  Attention was good.   Language was appropriate.  Mood was normal without depression.  Speech was not pressured.  Thought content was not tangential.    LABORATORY/RADIOLOGY DATA:  Lab Results  Component Value Date   WBC 10.6* 12/06/2011   HGB 13.0 12/06/2011   HCT 39.0 12/06/2011   PLT 128* 12/06/2011   GLUCOSE 96 12/06/2011   CHOL 125 03/30/2011   TRIG 90.0 03/30/2011   HDL 36.40* 03/30/2011   LDLCALC 71 03/30/2011     ALKPHOS 48 12/06/2011   ALT 24 12/06/2011   AST 35 12/06/2011   NA 143 12/06/2011   K 5.4* 12/06/2011   CL 109 12/06/2011   CREATININE 1.69* 12/06/2011   BUN 38* 12/06/2011   CO2 26 12/06/2011   PSA 0.00* 11/16/2011   HGBA1C 5.5 11/16/2011   MICROALBUR 5.9* 11/03/2009   ASSESSMENT AND PLAN:  1. Chronic mild thrombocytopenia:  This is most likely benign, possibly from medication-related.  His platelet count has always been above 100 for the past 3-1/2 years.  He does not have neutropenia or anemia.  Therefore, the likelihood of primary bone marrow problem is relatively low.  As thrombocytopenia is so mild, I do not recommend any invasive procedure as my treatment recommendation will be observation anyway.  I advised him that if his platelet count is consistently less than 100 or he has pancytopenia, then I may consider bone marrow biopsy for a diagnosis.  I discussed with him that spontaneous bleeding is rare for platelet counts less than 100 and therefore no treatment is needed until platelet count is less than 100.  Again, recommendation for now is observation. 2. Hypertension well controlled on Lisinopril per PCP. 3. Diabetes mellitus, type II.  He is on Januvia per PCP. 4. Hyperlipidemia.  He is on Crestor per PCP. 5. Slightly elevated Cr:  unclear etiology.  Query secondary to HTN, DM.  Appreciate follow up with PCP. 6. Follow-up: He will have a return visit in 1 year. I have recommended that a CBC be checked every 3 months, but the patient has declined to schedule this. He plans to have PCP follow this and refer him back if his platelet count is less than 100,000.  The length of time of the face-to-face encounter was 15 minutes. More than 50% of time was spent counseling and coordination of care.

## 2011-12-07 ENCOUNTER — Telehealth: Payer: Self-pay | Admitting: Oncology

## 2011-12-07 NOTE — Telephone Encounter (Signed)
pt called and scheduled appt for [id number]

## 2011-12-21 ENCOUNTER — Ambulatory Visit: Payer: Medicare Other | Admitting: Sports Medicine

## 2011-12-21 DIAGNOSIS — L97509 Non-pressure chronic ulcer of other part of unspecified foot with unspecified severity: Secondary | ICD-10-CM | POA: Diagnosis not present

## 2011-12-21 DIAGNOSIS — M79609 Pain in unspecified limb: Secondary | ICD-10-CM | POA: Diagnosis not present

## 2012-01-11 ENCOUNTER — Encounter: Payer: Self-pay | Admitting: Sports Medicine

## 2012-01-11 ENCOUNTER — Ambulatory Visit (INDEPENDENT_AMBULATORY_CARE_PROVIDER_SITE_OTHER): Payer: Medicare Other | Admitting: Sports Medicine

## 2012-01-11 VITALS — BP 121/67 | HR 58

## 2012-01-11 DIAGNOSIS — M79609 Pain in unspecified limb: Secondary | ICD-10-CM | POA: Diagnosis not present

## 2012-01-11 DIAGNOSIS — M79673 Pain in unspecified foot: Secondary | ICD-10-CM

## 2012-01-11 NOTE — Patient Instructions (Addendum)
Continue using green insoles as often as possible  Try ankle compression sleeve   Do ankle exercises a few times per week  Please follow up as needed  Thank you for seeing [place] today!

## 2012-01-11 NOTE — Progress Notes (Signed)
  Subjective:    Patient ID: [name], male    DOB: 12/18/1927, 78 y.o.   MRN: [id number]  HPI  Pt presents to clinic for f/u of rt heel pain which he reports is 60% improved. He is able to play tennis without the sharp pain he was having previously. No longer needs to take tylenol for pain.  Using sports insoles with heel lift in tennis shoes.    Review of Systems     Objective:   Physical Exam  Rt ankle exam:  Full plantar and dorsi flexion Inversion and eversion are tight Ligaments tight Has some pressure in tarsal tunnel Posterior tib strong Extensor hallicus strong 20 deg flexion and full extension of great toe      Assessment & Plan:

## 2012-01-11 NOTE — Assessment & Plan Note (Signed)
The sports insoles helped so I suspect the contusion has resolved Still some tenderness over flex hallucis and over tarsal tunnel Trial with XC compression brace RT ankle  Cont using sports insoles with heel lift  If this continues to resolve and not much pain after 1 mo he can check with me prn

## 2012-01-31 DIAGNOSIS — L0293 Carbuncle, unspecified: Secondary | ICD-10-CM | POA: Diagnosis not present

## 2012-01-31 DIAGNOSIS — L723 Sebaceous cyst: Secondary | ICD-10-CM | POA: Diagnosis not present

## 2012-02-08 ENCOUNTER — Ambulatory Visit: Payer: Medicare Other | Admitting: Cardiovascular Disease

## 2012-02-11 ENCOUNTER — Other Ambulatory Visit: Payer: Self-pay | Admitting: *Deleted

## 2012-02-11 ENCOUNTER — Other Ambulatory Visit: Payer: Self-pay | Admitting: Endocrinology

## 2012-02-11 MED ORDER — ZOLPIDEM TARTRATE 5 MG PO TABS: 5.0000 mg | ORAL_TABLET | Freq: Every evening | ORAL | Status: DC | PRN

## 2012-02-11 NOTE — Telephone Encounter (Signed)
Done hardcopy to robin  

## 2012-02-11 NOTE — Telephone Encounter (Signed)
R'cd fax from [place] for refill of Zolpidem-last written 08/11/2011 #30 with 5 refills. Please advise.

## 2012-02-14 NOTE — Telephone Encounter (Signed)
Faxed hardcopy to pharmacy. 

## 2012-02-16 DIAGNOSIS — L0292 Furuncle, unspecified: Secondary | ICD-10-CM | POA: Diagnosis not present

## 2012-02-16 DIAGNOSIS — L0293 Carbuncle, unspecified: Secondary | ICD-10-CM | POA: Diagnosis not present

## 2012-03-03 ENCOUNTER — Ambulatory Visit (INDEPENDENT_AMBULATORY_CARE_PROVIDER_SITE_OTHER): Payer: Medicare Other | Admitting: Cardiovascular Disease

## 2012-03-03 ENCOUNTER — Encounter: Payer: Self-pay | Admitting: Cardiovascular Disease

## 2012-03-03 VITALS — BP 119/54 | HR 62 | Ht 73.0 in | Wt 197.8 lb

## 2012-03-03 DIAGNOSIS — E785 Hyperlipidemia, unspecified: Secondary | ICD-10-CM | POA: Diagnosis not present

## 2012-03-03 DIAGNOSIS — I10 Essential (primary) hypertension: Secondary | ICD-10-CM | POA: Diagnosis not present

## 2012-03-03 MED ORDER — LISINOPRIL 40 MG PO TABS: 40.0000 mg | ORAL_TABLET | Freq: Every day | ORAL | Status: DC

## 2012-03-03 NOTE — Assessment & Plan Note (Signed)
Lipids reviewed and they have been at goal. He will continue on Crestor. He's followed by Dr. Loanne Drilling.

## 2012-03-03 NOTE — Patient Instructions (Addendum)
Your physician recommends that you continue on your current medications as directed. Please refer to the Current Medication list given to you today.  Your physician wants you to follow-up in: 1 year with Dr. Cooper.  You will receive a reminder letter in the mail two months in advance. If you don't receive a letter, please call our office to schedule the follow-up appointment.   

## 2012-03-03 NOTE — Assessment & Plan Note (Addendum)
Blood pressure is in the ideal range and he seems to be having no symptoms of hypotension. Will continue lisinopril 40 mg daily. The patient has a chronic kidney disease and this is been stable. His most recent creatinine was 1.7 mg/dL.

## 2012-03-03 NOTE — Progress Notes (Signed)
   HPI:  78 year old gentleman presenting for followup of hypertension. He has a history of syncope remotely. The patient remains physically active. He plays tennis regularly. He has no exertional symptoms. He specifically denies chest pain, chest pressure, dyspnea, edema, lightheadedness, or syncope. Overall he feels well. When I saw him last year, we reduced his lisinopril dose to 20 mg daily because we were concerned that his blood pressure was running too low. He noted with regular blood pressure checks at he was running a bit high and so he has put himself back on lisinopril 40 mg daily.  Outpatient Encounter Prescriptions as of 03/03/2012  Medication Sig Dispense Refill  . aspirin 81 MG tablet Take 81 mg by mouth daily.        . Cholecalciferol (VITAMIN D) 2000 UNITS CAPS Take 1 capsule by mouth daily.        . citalopram (CELEXA) 20 MG tablet TAKE 1 TABLET BY MOUTH ONCE A DAY  30 tablet  3  . JANUVIA 100 MG tablet TAKE 1/2 TABLET BY MOUTH DAILY  45 tablet  2  . lisinopril (PRINIVIL,ZESTRIL) 40 MG tablet Take 40 mg by mouth daily.       . rosuvastatin (CRESTOR) 40 MG tablet Take 1 tablet (40 mg total) by mouth daily.  30 tablet  11  . zolpidem (AMBIEN) 5 MG tablet Take 1 tablet (5 mg total) by mouth at bedtime as needed for sleep.  30 tablet  5  . Thiamine HCl (VITAMIN B-1) 100 MG tablet Take 100 mg by mouth daily.          Allergies  Allergen Reactions  . Sulfonamide Derivatives     REACTION: Itch/Red Spots    Past Medical History  Diagnosis Date  . PROSTATE CANCER 11/07/2007  . THYROID NODULE, RIGHT 11/13/2008  . DIABETES MELLITUS, TYPE II 02/04/2007  . HYPERLIPIDEMIA 12/04/2007  . UNSPECIFIED ANEMIA 11/07/2007  . THROMBOCYTOPENIA 04/17/2008  . DEPRESSION 02/04/2007  . OBSTRUCTIVE SLEEP APNEA 11/05/2009    -PSG 01/05/10 RDI 11, PLMI 56  . GLAUCOMA 03/08/2009  . HYPERTENSION 02/04/2007  . BRADYCARDIA 11/13/2008  . DIVERTICULOSIS, COLON 02/04/2007  . RENAL INSUFFICIENCY 11/13/2008  .  OSTEOARTHRITIS 02/04/2007  . COLONIC POLYPS, HX OF 02/04/2007  . FOREIGN BODY, ASPIRATION 12/04/2007  . SYNCOPE 12/31/2009    ROS: Negative except as per HPI  BP 119/54  Pulse 62  Ht 6\' 1"  (1.854 m)  Wt 89.721 kg (197 lb 12.8 oz)  BMI 26.10 kg/m2  PHYSICAL EXAM: Pt is alert and oriented, pleasant elderly male in NAD HEENT: normal Neck: JVP - normal, carotids 2+= without bruits Lungs: CTA bilaterally CV: RRR without murmur or gallop Abd: soft, NT, Positive BS, no hepatomegaly Ext: no C/C/E, distal pulses intact and equal Skin: warm/dry no rash  EKG:  Normal sinus rhythm, within normal limits. Heart rate 60 beats per minute.  ASSESSMENT AND PLAN:

## 2012-03-13 ENCOUNTER — Other Ambulatory Visit: Payer: Self-pay | Admitting: *Deleted

## 2012-03-13 MED ORDER — SITAGLIPTIN PHOSPHATE 100 MG PO TABS: ORAL_TABLET | ORAL | Status: DC

## 2012-03-13 NOTE — Telephone Encounter (Signed)
R'cd fax from [place] for refill of Januvia.

## 2012-03-20 ENCOUNTER — Other Ambulatory Visit: Payer: Self-pay | Admitting: Dermatology

## 2012-03-20 DIAGNOSIS — D485 Neoplasm of uncertain behavior of skin: Secondary | ICD-10-CM | POA: Diagnosis not present

## 2012-03-20 DIAGNOSIS — L819 Disorder of pigmentation, unspecified: Secondary | ICD-10-CM | POA: Diagnosis not present

## 2012-03-20 DIAGNOSIS — D239 Other benign neoplasm of skin, unspecified: Secondary | ICD-10-CM | POA: Diagnosis not present

## 2012-03-20 DIAGNOSIS — Z85828 Personal history of other malignant neoplasm of skin: Secondary | ICD-10-CM | POA: Diagnosis not present

## 2012-03-20 DIAGNOSIS — L821 Other seborrheic keratosis: Secondary | ICD-10-CM | POA: Diagnosis not present

## 2012-03-20 DIAGNOSIS — C44319 Basal cell carcinoma of skin of other parts of face: Secondary | ICD-10-CM | POA: Diagnosis not present

## 2012-03-20 DIAGNOSIS — L910 Hypertrophic scar: Secondary | ICD-10-CM | POA: Diagnosis not present

## 2012-03-20 DIAGNOSIS — L439 Lichen planus, unspecified: Secondary | ICD-10-CM | POA: Diagnosis not present

## 2012-03-28 ENCOUNTER — Other Ambulatory Visit (INDEPENDENT_AMBULATORY_CARE_PROVIDER_SITE_OTHER): Payer: Medicare Other

## 2012-03-28 DIAGNOSIS — E785 Hyperlipidemia, unspecified: Secondary | ICD-10-CM

## 2012-03-28 DIAGNOSIS — Z79899 Other long term (current) drug therapy: Secondary | ICD-10-CM

## 2012-03-28 DIAGNOSIS — I10 Essential (primary) hypertension: Secondary | ICD-10-CM | POA: Diagnosis not present

## 2012-03-28 DIAGNOSIS — C61 Malignant neoplasm of prostate: Secondary | ICD-10-CM | POA: Diagnosis not present

## 2012-03-28 DIAGNOSIS — E119 Type 2 diabetes mellitus without complications: Secondary | ICD-10-CM

## 2012-03-28 DIAGNOSIS — C44319 Basal cell carcinoma of skin of other parts of face: Secondary | ICD-10-CM | POA: Diagnosis not present

## 2012-03-28 DIAGNOSIS — R609 Edema, unspecified: Secondary | ICD-10-CM | POA: Diagnosis not present

## 2012-03-28 DIAGNOSIS — D696 Thrombocytopenia, unspecified: Secondary | ICD-10-CM | POA: Diagnosis not present

## 2012-03-28 DIAGNOSIS — D649 Anemia, unspecified: Secondary | ICD-10-CM

## 2012-03-28 LAB — BASIC METABOLIC PANEL
CO2: 26 mEq/L (ref 19–32)
Calcium: 8.9 mg/dL (ref 8.4–10.5)
Chloride: 107 mEq/L (ref 96–112)
Glucose, Bld: 81 mg/dL (ref 70–99)
Sodium: 140 mEq/L (ref 135–145)

## 2012-03-28 LAB — MICROALBUMIN / CREATININE URINE RATIO: Microalb Creat Ratio: 7 mg/g (ref 0.0–30.0)

## 2012-03-28 LAB — CBC WITH DIFFERENTIAL/PLATELET
Basophils Absolute: 0 10*3/uL (ref 0.0–0.1)
Eosinophils Absolute: 0.3 10*3/uL (ref 0.0–0.7)
Lymphocytes Relative: 31 % (ref 12.0–46.0)
MCHC: 32.8 g/dL (ref 30.0–36.0)
Neutro Abs: 5 10*3/uL (ref 1.4–7.7)
Neutrophils Relative %: 52.3 % (ref 43.0–77.0)
Platelets: 120 10*3/uL — ABNORMAL LOW (ref 150.0–400.0)
RDW: 14.3 % (ref 11.5–14.6)

## 2012-03-28 LAB — LIPID PANEL
HDL: 37.5 mg/dL — ABNORMAL LOW (ref >39.00)
LDL Cholesterol: 67 mg/dL (ref 0–99)
Total CHOL/HDL Ratio: 3
Triglycerides: 76 mg/dL (ref 0.0–149.0)

## 2012-03-28 LAB — IBC PANEL
Iron: 65 ug/dL (ref 42–165)
Transferrin: 215.7 mg/dL (ref 212.0–360.0)

## 2012-03-28 LAB — HEPATIC FUNCTION PANEL
Albumin: 3.8 g/dL (ref 3.5–5.2)
Total Protein: 6.3 g/dL (ref 6.0–8.3)

## 2012-03-28 LAB — URINALYSIS, ROUTINE W REFLEX MICROSCOPIC
Ketones, ur: NEGATIVE
Leukocytes, UA: NEGATIVE
Specific Gravity, Urine: 1.02 (ref 1.000–1.030)
Urobilinogen, UA: 0.2 (ref 0.0–1.0)

## 2012-03-29 LAB — PSA: PSA: 0 ng/mL — ABNORMAL LOW (ref 0.10–4.00)

## 2012-04-04 ENCOUNTER — Ambulatory Visit (INDEPENDENT_AMBULATORY_CARE_PROVIDER_SITE_OTHER): Payer: Medicare Other | Admitting: Endocrinology

## 2012-04-04 ENCOUNTER — Encounter: Payer: Self-pay | Admitting: Endocrinology

## 2012-04-04 VITALS — BP 126/74 | HR 55 | Temp 97.9°F | Wt 201.0 lb

## 2012-04-04 DIAGNOSIS — R202 Paresthesia of skin: Secondary | ICD-10-CM | POA: Insufficient documentation

## 2012-04-04 DIAGNOSIS — R209 Unspecified disturbances of skin sensation: Secondary | ICD-10-CM | POA: Diagnosis not present

## 2012-04-04 DIAGNOSIS — Z0000 Encounter for general adult medical examination without abnormal findings: Secondary | ICD-10-CM | POA: Diagnosis not present

## 2012-04-04 DIAGNOSIS — Z23 Encounter for immunization: Secondary | ICD-10-CM

## 2012-04-04 LAB — VITAMIN B12: Vitamin B-12: 568 pg/mL (ref 211–911)

## 2012-04-04 NOTE — Progress Notes (Signed)
Subjective:    Patient ID: [name], male    DOB: 1927-10-27, 78 y.o.   MRN: [id number]  HPI Pt states a few mos of slight clammy feeling between the toes, but no assoc pain.   Past Medical History  Diagnosis Date  . PROSTATE CANCER 11/07/2007  . THYROID NODULE, RIGHT 11/13/2008  . DIABETES MELLITUS, TYPE II 02/04/2007  . HYPERLIPIDEMIA 12/04/2007  . UNSPECIFIED ANEMIA 11/07/2007  . THROMBOCYTOPENIA 04/17/2008  . DEPRESSION 02/04/2007  . OBSTRUCTIVE SLEEP APNEA 11/05/2009    -PSG 01/05/10 RDI 11, PLMI 56  . GLAUCOMA 03/08/2009  . HYPERTENSION 02/04/2007  . BRADYCARDIA 11/13/2008  . DIVERTICULOSIS, COLON 02/04/2007  . RENAL INSUFFICIENCY 11/13/2008  . OSTEOARTHRITIS 02/04/2007  . COLONIC POLYPS, HX OF 02/04/2007  . FOREIGN BODY, ASPIRATION 12/04/2007  . SYNCOPE 12/31/2009    Past Surgical History  Procedure Date  . Prostatectomy 1997  . Heart cartherization 2000  . Tonsillectomy 1940's    History   Social History  . Marital Status: Married    Spouse Name: N/A    Number of Children: N/A  . Years of Education: N/A   Occupational History  .      Retired   Social History Main Topics  . Smoking status: Former Smoker    Types: Cigarettes, Pipe    Quit date: 07/08/1993  . Smokeless tobacco: Never Used  . Alcohol Use: No  . Drug Use: No  . Sexually Active: Not on file   Other Topics Concern  . Not on file   Social History Narrative   He plays tennis 3x a week    Current Outpatient Prescriptions on File Prior to Visit  Medication Sig Dispense Refill  . aspirin 81 MG tablet Take 81 mg by mouth daily.        . Cholecalciferol (VITAMIN D) 2000 UNITS CAPS Take 1 capsule by mouth daily.        . citalopram (CELEXA) 20 MG tablet TAKE 1 TABLET BY MOUTH ONCE A DAY  30 tablet  3  . lisinopril (PRINIVIL,ZESTRIL) 40 MG tablet Take 1 tablet (40 mg total) by mouth daily.  30 tablet  11  . rosuvastatin (CRESTOR) 40 MG tablet Take 1 tablet (40 mg total) by mouth daily.  30 tablet  11  .  sitaGLIPtin (JANUVIA) 100 MG tablet TAKE 1/2 TABLET BY MOUTH DAILY  45 tablet  1  . Thiamine HCl (VITAMIN B-1) 100 MG tablet Take 100 mg by mouth daily.        Marland Kitchen zolpidem (AMBIEN) 5 MG tablet Take 1 tablet (5 mg total) by mouth at bedtime as needed for sleep.  30 tablet  5    Allergies  Allergen Reactions  . Sulfonamide Derivatives     REACTION: Itch/Red Spots    Family History  Problem Relation Age of Onset  . Kidney disease Father   . Heart disease Neg Hx     No FH of Coronary Artery Disease  . Cancer Neg Hx     BP 126/74  Pulse 55  Temp 97.9 F (36.6 C) (Oral)  Wt 201 lb (91.173 kg)  Review of Systems  Constitutional: Negative for fever.  Respiratory: Negative for shortness of breath.   Cardiovascular: Negative for chest pain.  Gastrointestinal: Negative for anal bleeding.  Genitourinary: Negative for hematuria and difficulty urinating.      Objective:   Physical Exam Pulses: dorsalis pedis intact bilat.   Feet: no deformity.  no ulcer on the feet.  feet are of normal color and temp.  no edema.  There is bilateral onychomycosis Neuro: sensation is intact to touch on the feet  Lab Results  Component Value Date   WBC 9.6 03/28/2012   HGB 12.8* 03/28/2012   HCT 38.9* 03/28/2012   PLT 120.0* 03/28/2012   GLUCOSE 81 03/28/2012   CHOL 120 03/28/2012   TRIG 76.0 03/28/2012   HDL 37.50* 03/28/2012   LDLCALC 67 03/28/2012   ALT 25 03/28/2012   AST 36 03/28/2012   NA 140 03/28/2012   K 4.9 03/28/2012   CL 107 03/28/2012   CREATININE 1.7* 03/28/2012   BUN 39* 03/28/2012   CO2 26 03/28/2012   TSH 2.89 03/28/2012   PSA 0.00 Repeated and verified X2.* 03/28/2012   HGBA1C 5.5 03/28/2012   MICROALBUR 5.5* 03/28/2012  b-12=normal    Assessment & Plan:  Paresthesias, new, uncertain etiology Renal insuff, stable Dyslipidemia, well-controlled   Subjective:   Patient here for Medicare annual wellness visit and management of other chronic and acute problems.     Risk factors:  advanced age    43 of Physicians Providing Medical Care to Patient:  See "snapshot"   Activities of Daily Living: In your present state of health, do you have any difficulty performing the following activities?:  Preparing food and eating?: No  Bathing yourself: No  Getting dressed: No  Using the toilet:No  Moving around from place to place: No  In the past year have you fallen or had a near fall?: No    Home Safety: Has smoke detector and wears seat belts. No firearms. No excess sun exposure.  Diet and Exercise  Current exercise habits: pt says good Dietary issues discussed: pt reports a healthy diet   Depression Screen  Q1: Over the past two weeks, have you felt down, depressed or hopeless? no  Q2: Over the past two weeks, have you felt little interest or pleasure in doing things? no   The following portions of the patient's history were reviewed and updated as appropriate: allergies, current medications, past family history, past medical history, past social history, past surgical history and problem list.   Review of Systems  No change in chronic hearing and visual loss Objective:   Vision:  Sees opthalmologist Hearing: grossly normal Body mass index:  See vs page Msk: pt easily and quickly performs "get-up-and-go" from a sitting position Cognitive Impairment Assessment: cognition, memory and judgment appear normal.  remembers 3/3 at 5 minutes.  excellent recall.  can easily read and write a sentence.  alert and oriented x 3   Assessment:   Medicare wellness utd on preventive parameters    Plan:   During the course of the visit the patient was educated and counseled about appropriate screening and preventive services including:        Fall prevention   Diabetes screening  Nutrition counseling   Vaccines / LABS Zostavax / Pnemonccoal Vaccine  today  PSA  Patient Instructions (the written plan) was given to the patient.

## 2012-04-04 NOTE — Patient Instructions (Addendum)
A blood test is requested for you today.  You will be contacted with results. please consider these measures for your health:  minimize alcohol.  do not use tobacco products.  have a colonoscopy at least every 10 years from age 78.  keep firearms safely stored.  always use seat belts.  have working smoke alarms in your home.  see an eye doctor and dentist regularly.  never drive under the influence of alcohol or drugs (including prescription drugs).  those with fair skin should take precautions against the sun. please let me know what your wishes would be, if artificial life support measures should become necessary.  it is critically important to prevent falling down (keep floor areas well-lit, dry, and free of loose objects.  If you have a cane, walker, or wheelchair, you should use it, even for short trips around the house.  Also, try not to rush).   Please come back for a follow-up appointment in 6 months. (update: we discussed code status.  pt requests full code, but would not want to be started or maintained on artificial life-support measures if there was not a reasonable chance of recovery)

## 2012-04-10 ENCOUNTER — Telehealth: Payer: Self-pay | Admitting: Endocrinology

## 2012-04-10 NOTE — Telephone Encounter (Signed)
Pt's drug company is no longer going to be carrying Januvia after the new year. They suggested either Tragenta or Onglyza as substitutes. Which would Dr. Loanne Drilling prefer, or does pt need to continue taking original med? Please advise.

## 2012-04-11 ENCOUNTER — Other Ambulatory Visit: Payer: Self-pay | Admitting: Endocrinology

## 2012-04-11 MED ORDER — SAXAGLIPTIN HCL 5 MG PO TABS: 2.5000 mg | ORAL_TABLET | Freq: Every day | ORAL | Status: DC

## 2012-04-11 NOTE — Telephone Encounter (Signed)
i changed and sent rx 

## 2012-04-11 NOTE — Telephone Encounter (Signed)
Pt.notified

## 2012-05-19 ENCOUNTER — Other Ambulatory Visit: Payer: Self-pay | Admitting: *Deleted

## 2012-05-19 MED ORDER — ROSUVASTATIN CALCIUM 40 MG PO TABS: 40.0000 mg | ORAL_TABLET | Freq: Every day | ORAL | Status: DC

## 2012-05-19 NOTE — Telephone Encounter (Signed)
Refill medication sent to [facility] in [place].

## 2012-06-13 DIAGNOSIS — E119 Type 2 diabetes mellitus without complications: Secondary | ICD-10-CM | POA: Diagnosis not present

## 2012-06-13 DIAGNOSIS — E7800 Pure hypercholesterolemia, unspecified: Secondary | ICD-10-CM | POA: Diagnosis not present

## 2012-06-13 DIAGNOSIS — I10 Essential (primary) hypertension: Secondary | ICD-10-CM | POA: Diagnosis not present

## 2012-06-13 DIAGNOSIS — I498 Other specified cardiac arrhythmias: Secondary | ICD-10-CM | POA: Diagnosis not present

## 2012-06-19 ENCOUNTER — Other Ambulatory Visit: Payer: Self-pay | Admitting: Endocrinology

## 2012-07-04 DIAGNOSIS — I714 Abdominal aortic aneurysm, without rupture: Secondary | ICD-10-CM | POA: Diagnosis not present

## 2012-07-21 ENCOUNTER — Other Ambulatory Visit: Payer: Self-pay | Admitting: Endocrinology

## 2012-07-25 DIAGNOSIS — M899 Disorder of bone, unspecified: Secondary | ICD-10-CM | POA: Diagnosis not present

## 2012-07-25 DIAGNOSIS — E119 Type 2 diabetes mellitus without complications: Secondary | ICD-10-CM | POA: Diagnosis not present

## 2012-07-25 DIAGNOSIS — N189 Chronic kidney disease, unspecified: Secondary | ICD-10-CM | POA: Diagnosis not present

## 2012-07-25 DIAGNOSIS — E7800 Pure hypercholesterolemia, unspecified: Secondary | ICD-10-CM | POA: Diagnosis not present

## 2012-07-25 DIAGNOSIS — I10 Essential (primary) hypertension: Secondary | ICD-10-CM | POA: Diagnosis not present

## 2012-08-15 DIAGNOSIS — E7800 Pure hypercholesterolemia, unspecified: Secondary | ICD-10-CM | POA: Diagnosis not present

## 2012-08-15 DIAGNOSIS — N189 Chronic kidney disease, unspecified: Secondary | ICD-10-CM | POA: Diagnosis not present

## 2012-08-21 ENCOUNTER — Telehealth: Payer: Self-pay | Admitting: *Deleted

## 2012-08-21 MED ORDER — ZOLPIDEM TARTRATE 5 MG PO TABS: 5.0000 mg | ORAL_TABLET | Freq: Every evening | ORAL | Status: DC | PRN

## 2012-08-21 NOTE — Telephone Encounter (Signed)
Patient / pharmacy/ [place], [place], [place] requesting refill on medication Zolpidem. Please advise.

## 2012-08-21 NOTE — Telephone Encounter (Signed)
Rx refill for medication Zolpidem faxed to pharmacy.

## 2012-08-21 NOTE — Telephone Encounter (Signed)
i printed 

## 2012-08-23 DIAGNOSIS — H40019 Open angle with borderline findings, low risk, unspecified eye: Secondary | ICD-10-CM | POA: Diagnosis not present

## 2012-08-31 ENCOUNTER — Telehealth: Payer: Self-pay | Admitting: Endocrinology

## 2012-08-31 NOTE — Telephone Encounter (Signed)
Pt says he left a message Monday and did not get a return call. Please call patient regarding his Ambien script. He says he is having issues w/ [employer] covering more than a certain amount per year. Patient CB# [phone] / Sherri S.

## 2012-08-31 NOTE — Telephone Encounter (Signed)
Patient called upset that no one had returned his call concerning his Rx for Ambien. States his [employer] they will only cover a certain number of pills for the year. States that the only way they will cover 30 pills per month for one year is if you submit a document of the need for the patient to have this quanity per year. [employer] number he gave was [phone]. Patient call back number # [phone]. States he has enough till next week but to please address this. Rx refill was sent in on 08/21/12 to [facility] in [place]. Please advise.

## 2012-09-01 NOTE — Telephone Encounter (Signed)
Options:  i can write it for 10 mg, 1/2 tab at a time, or: i would be happy to do PA

## 2012-09-05 ENCOUNTER — Telehealth: Payer: Self-pay | Admitting: Endocrinology

## 2012-09-05 NOTE — Telephone Encounter (Signed)
please call patient: Options:  i can write it for 10 mg, 1/2 tab at a time, or:  i would be happy to do PA for [place] CR

## 2012-09-05 NOTE — Telephone Encounter (Signed)
Tried to call and get PA was told that Lunesta and Ambien CR were alternatives

## 2012-09-05 NOTE — Telephone Encounter (Signed)
Please call patient regarding prior approval for medication w/ [employer] / Gayleen Orem.  - patient CB#  [phone]

## 2012-09-07 NOTE — Telephone Encounter (Signed)
Pt called back and let a v-mail message to disregard this message, as we had tried to call pt several times and left messages

## 2012-09-07 NOTE — Telephone Encounter (Signed)
Pt called back and let v-mail message that he would like [place] to disregard this message

## 2012-09-11 ENCOUNTER — Telehealth: Payer: Self-pay | Admitting: Endocrinology

## 2012-09-11 NOTE — Telephone Encounter (Signed)
The patient called to request a return call from Dr. Cordelia Pen nurse to "discuss and conclude a matter".  Please call the patient at [phone].

## 2012-09-11 NOTE — Telephone Encounter (Signed)
Pt called to tell us to disregard getting a prior auth for Ambien he will "pay the difference".

## 2012-09-21 ENCOUNTER — Other Ambulatory Visit: Payer: Self-pay | Admitting: *Deleted

## 2012-09-21 DIAGNOSIS — I10 Essential (primary) hypertension: Secondary | ICD-10-CM

## 2012-09-21 MED ORDER — LISINOPRIL 40 MG PO TABS: 40.0000 mg | ORAL_TABLET | Freq: Every day | ORAL | Status: DC

## 2012-09-21 MED ORDER — ROSUVASTATIN CALCIUM 40 MG PO TABS: 40.0000 mg | ORAL_TABLET | Freq: Every day | ORAL | Status: DC

## 2012-09-22 MED ORDER — ZOLPIDEM TARTRATE 5 MG PO TABS: 5.0000 mg | ORAL_TABLET | Freq: Every evening | ORAL | Status: DC | PRN

## 2012-09-22 MED ORDER — CITALOPRAM HYDROBROMIDE 20 MG PO TABS: 20.0000 mg | ORAL_TABLET | Freq: Every day | ORAL | Status: DC

## 2012-10-03 ENCOUNTER — Telehealth: Payer: Self-pay | Admitting: Endocrinology

## 2012-10-04 ENCOUNTER — Telehealth: Payer: Self-pay | Admitting: Endocrinology

## 2012-10-04 DIAGNOSIS — I10 Essential (primary) hypertension: Secondary | ICD-10-CM

## 2012-10-04 MED ORDER — LISINOPRIL 40 MG PO TABS: 40.0000 mg | ORAL_TABLET | Freq: Every day | ORAL | Status: DC

## 2012-10-04 MED ORDER — ROSUVASTATIN CALCIUM 40 MG PO TABS: 40.0000 mg | ORAL_TABLET | Freq: Every day | ORAL | Status: DC

## 2012-10-04 MED ORDER — CITALOPRAM HYDROBROMIDE 20 MG PO TABS: 20.0000 mg | ORAL_TABLET | Freq: Every day | ORAL | Status: DC

## 2012-10-04 NOTE — Telephone Encounter (Signed)
Received email from the patient -   I would appreciate your office doing the flollowing for me.  I am a patient of Dr Loanne Drilling. My name is [name] and my date of birth is 01-30-1928.   Please fax [facility] at [phone] prescriptions for me for the following drugs - Citalopram 20 mg,Crestor 40mg  and Lisinopril 40mg .   On the cover sheet of the fax please show my name, date of birth [birth year] )  and [place] member ID number-[id number].

## 2012-11-06 ENCOUNTER — Telehealth: Payer: Self-pay | Admitting: Endocrinology

## 2012-11-06 DIAGNOSIS — N259 Disorder resulting from impaired renal tubular function, unspecified: Secondary | ICD-10-CM

## 2012-11-06 DIAGNOSIS — E119 Type 2 diabetes mellitus without complications: Secondary | ICD-10-CM

## 2012-11-06 DIAGNOSIS — D649 Anemia, unspecified: Secondary | ICD-10-CM

## 2012-11-06 NOTE — Telephone Encounter (Signed)
Patient would like to go 11/07/12 to the [facility] lab for his blood work. Please put in his orders.

## 2012-11-06 NOTE — Telephone Encounter (Signed)
done

## 2012-11-08 ENCOUNTER — Ambulatory Visit (INDEPENDENT_AMBULATORY_CARE_PROVIDER_SITE_OTHER): Payer: Medicare Other | Admitting: Sports Medicine

## 2012-11-08 ENCOUNTER — Encounter: Payer: Self-pay | Admitting: Sports Medicine

## 2012-11-08 VITALS — BP 116/69 | HR 58 | Ht 72.0 in | Wt 193.0 lb

## 2012-11-08 DIAGNOSIS — M79609 Pain in unspecified limb: Secondary | ICD-10-CM | POA: Diagnosis not present

## 2012-11-08 DIAGNOSIS — M7521 Bicipital tendinitis, right shoulder: Secondary | ICD-10-CM | POA: Insufficient documentation

## 2012-11-08 DIAGNOSIS — M1990 Unspecified osteoarthritis, unspecified site: Secondary | ICD-10-CM | POA: Diagnosis not present

## 2012-11-08 DIAGNOSIS — M7520 Bicipital tendinitis, unspecified shoulder: Secondary | ICD-10-CM

## 2012-11-08 DIAGNOSIS — M79671 Pain in right foot: Secondary | ICD-10-CM

## 2012-11-08 NOTE — Assessment & Plan Note (Signed)
Begin a series of exercises for his rotator cuff  We should recheck this in 6 weeks  His pain is rather mild and I did not suggest medications

## 2012-11-08 NOTE — Patient Instructions (Addendum)
Please do suggested exercises daily  Please follow up in 6 weeks  Thank you for seeing [place] today!

## 2012-11-08 NOTE — Assessment & Plan Note (Signed)
On today's exam the patient has more symptoms at the posterior calcaneus and a Haglund's deformity  We will give him calf exercises to do at home  Use heel lifts  Limit playing to every other day

## 2012-11-08 NOTE — Progress Notes (Signed)
  Subjective:    Patient ID: [name], male    DOB: [birth year], 79 y.o.   MRN: [id number]  HPI  Pt presents to clinic for evaluation of a few problems. 1. Rt ankle pain at insertion of AT x a few months after playing a lot of tennis this winter in [place]. 2. Rt anterior shin pain that occurs mostly in the mornings.  Feels like a burning sensation. 3.  Rt shoulder pain that has been bothering him x 4-5 months. Rotation of head with sleeping is painful.   his shoulder and ankle pain started after playing in 6 days in a row  Review of Systems     Objective:   Physical Exam Pleasant and in no acute distress  Rt Ankle:  ttp at insertion of rt AT both side Mild haglund's deformity at calcaneus  AT appears normal  Neck exam: 10 deg extension 20 deg left rotation Full flexion 15 deg rt rotation 20 deg lt lat bend 5 deg rt lat bend   No scapular dyskinesis  Empty can negative Slight pain with hawkins  MSK ultrasound Partial rupture of the bicipital tendon on the right Rotator cuff tendons are intact but he has some atrophy of the infraspinatus A.c. joint shows significant arthritis  Right Achilles tendon shows that the tendon is intact the there are multiple spurs and Haglund deformity changes       Assessment & Plan:

## 2012-11-08 NOTE — Assessment & Plan Note (Signed)
His right shoulder pain today is partially triggered by significant osteoarthritis of the cervical spine  He was given range of motion and posture exercises to limit the amount of spasm he gets in his trapezius and right shoulder

## 2012-11-14 ENCOUNTER — Ambulatory Visit: Payer: Medicare Other

## 2012-11-14 DIAGNOSIS — N259 Disorder resulting from impaired renal tubular function, unspecified: Secondary | ICD-10-CM

## 2012-11-14 DIAGNOSIS — E119 Type 2 diabetes mellitus without complications: Secondary | ICD-10-CM

## 2012-11-14 DIAGNOSIS — D649 Anemia, unspecified: Secondary | ICD-10-CM

## 2012-11-14 LAB — CBC WITH DIFFERENTIAL/PLATELET
Eosinophils Relative: 2.7 % (ref 0.0–5.0)
HCT: 39.1 % (ref 39.0–52.0)
Hemoglobin: 13.4 g/dL (ref 13.0–17.0)
Lymphs Abs: 3.2 10*3/uL (ref 0.7–4.0)
Monocytes Relative: 13.7 % — ABNORMAL HIGH (ref 3.0–12.0)
Platelets: 119 10*3/uL — ABNORMAL LOW (ref 150.0–400.0)
WBC: 9.7 10*3/uL (ref 4.5–10.5)

## 2012-11-14 LAB — BASIC METABOLIC PANEL
CO2: 28 mEq/L (ref 19–32)
Calcium: 9.1 mg/dL (ref 8.4–10.5)
Chloride: 108 mEq/L (ref 96–112)
Sodium: 141 mEq/L (ref 135–145)

## 2012-11-14 LAB — IBC PANEL: Iron: 55 ug/dL (ref 42–165)

## 2012-11-17 ENCOUNTER — Encounter: Payer: Self-pay | Admitting: Endocrinology

## 2012-11-17 ENCOUNTER — Ambulatory Visit (INDEPENDENT_AMBULATORY_CARE_PROVIDER_SITE_OTHER): Payer: Medicare Other | Admitting: Endocrinology

## 2012-11-17 VITALS — BP 126/72 | HR 78 | Ht 72.0 in | Wt 199.0 lb

## 2012-11-17 DIAGNOSIS — E1129 Type 2 diabetes mellitus with other diabetic kidney complication: Secondary | ICD-10-CM | POA: Diagnosis not present

## 2012-11-17 DIAGNOSIS — E119 Type 2 diabetes mellitus without complications: Secondary | ICD-10-CM | POA: Diagnosis not present

## 2012-11-17 NOTE — Patient Instructions (Addendum)
Please come back for a "medicare wellness" appointment in 6 months. It is ok to stay-off the [place].

## 2012-11-17 NOTE — Progress Notes (Signed)
Subjective:    Patient ID: [name], male    DOB: [birth year], 79 y.o.   MRN: [id number]  HPI The state of at least three ongoing medical problems is addressed today, with interval history of each noted here: Pt returns for f/u of type 2 DM (dx'ed [id number]; complicated by renal insufficiency and PAD).  He stopped [place], as he was told he did not need it.   Past Medical History  Diagnosis Date  . PROSTATE CANCER 11/07/2007  . THYROID NODULE, RIGHT 11/13/2008  . DIABETES MELLITUS, TYPE II 02/04/2007  . HYPERLIPIDEMIA 12/04/2007  . UNSPECIFIED ANEMIA 11/07/2007  . THROMBOCYTOPENIA 04/17/2008  . DEPRESSION 02/04/2007  . OBSTRUCTIVE SLEEP APNEA 11/05/2009    -PSG 01/05/10 RDI 11, PLMI 56  . GLAUCOMA 03/08/2009  . HYPERTENSION 02/04/2007  . BRADYCARDIA 11/13/2008  . DIVERTICULOSIS, COLON 02/04/2007  . RENAL INSUFFICIENCY 11/13/2008  . OSTEOARTHRITIS 02/04/2007  . COLONIC POLYPS, HX OF 02/04/2007  . FOREIGN BODY, ASPIRATION 12/04/2007  . SYNCOPE 12/31/2009    Past Surgical History  Procedure Laterality Date  . Prostatectomy  1997  . Heart cartherization  2000  . Tonsillectomy  1940's    History   Social History  . Marital Status: Married    Spouse Name: N/A    Number of Children: N/A  . Years of Education: N/A   Occupational History  .      Retired   Social History Main Topics  . Smoking status: Former Smoker    Types: Cigarettes, Pipe    Quit date: 07/08/1993  . Smokeless tobacco: Never Used  . Alcohol Use: No  . Drug Use: No  . Sexually Active: Not on file   Other Topics Concern  . Not on file   Social History Narrative   He plays tennis 3x a week    Current Outpatient Prescriptions on File Prior to Visit  Medication Sig Dispense Refill  . aspirin 81 MG tablet Take 81 mg by mouth daily.        . Cholecalciferol (VITAMIN D) 2000 UNITS CAPS Take 1 capsule by mouth daily.        . citalopram (CELEXA) 20 MG tablet Take 1 tablet (20 mg total) by mouth daily.  30 tablet  3  .  lisinopril (PRINIVIL,ZESTRIL) 40 MG tablet Take 1 tablet (40 mg total) by mouth daily.  30 tablet  11  . rosuvastatin (CRESTOR) 40 MG tablet Take 1 tablet (40 mg total) by mouth daily.  30 tablet  11  . Thiamine HCl (VITAMIN B-1) 100 MG tablet Take 100 mg by mouth daily.        Marland Kitchen zolpidem (AMBIEN) 5 MG tablet Take 1 tablet (5 mg total) by mouth at bedtime as needed for sleep.  30 tablet  5   No current facility-administered medications on file prior to visit.    Allergies  Allergen Reactions  . Sulfonamide Derivatives     REACTION: Itch/Red Spots    Family History  Problem Relation Age of Onset  . Kidney disease Father   . Heart disease Neg Hx     No FH of Coronary Artery Disease  . Cancer Neg Hx     BP 126/72  Pulse 78  Ht 6' (1.829 m)  Wt 199 lb (90.266 kg)  BMI 26.98 kg/m2  SpO2 98%  Review of Systems Denies brbrpr and hematuria.      Objective:   Physical Exam VITAL SIGNS:  See vs page GENERAL: no distress  Lab  Results  Component Value Date   HGBA1C 5.8 11/14/2012      Assessment & Plan:  DM: he can 37 [place] for now.

## 2012-12-04 ENCOUNTER — Other Ambulatory Visit: Payer: Medicare Other | Admitting: Lab

## 2012-12-04 ENCOUNTER — Ambulatory Visit: Payer: Medicare Other | Admitting: Oncology

## 2012-12-05 ENCOUNTER — Telehealth: Payer: Self-pay | Admitting: Oncology

## 2012-12-07 ENCOUNTER — Other Ambulatory Visit: Payer: Medicare Other | Admitting: Lab

## 2012-12-07 ENCOUNTER — Ambulatory Visit: Payer: Medicare Other | Admitting: Oncology

## 2012-12-12 ENCOUNTER — Other Ambulatory Visit: Payer: Self-pay | Admitting: Oncology

## 2012-12-12 DIAGNOSIS — D696 Thrombocytopenia, unspecified: Secondary | ICD-10-CM

## 2012-12-13 ENCOUNTER — Ambulatory Visit (HOSPITAL_BASED_OUTPATIENT_CLINIC_OR_DEPARTMENT_OTHER): Payer: Medicare Other | Admitting: Oncology

## 2012-12-13 ENCOUNTER — Other Ambulatory Visit (HOSPITAL_BASED_OUTPATIENT_CLINIC_OR_DEPARTMENT_OTHER): Payer: Medicare Other | Admitting: Lab

## 2012-12-13 VITALS — BP 108/57 | HR 58 | Temp 97.0°F | Resp 17 | Ht 73.0 in | Wt 197.2 lb

## 2012-12-13 DIAGNOSIS — D696 Thrombocytopenia, unspecified: Secondary | ICD-10-CM

## 2012-12-13 LAB — CBC WITH DIFFERENTIAL/PLATELET
Eosinophils Absolute: 0.2 10*3/uL (ref 0.0–0.5)
LYMPH%: 34.6 % (ref 14.0–49.0)
MONO#: 1.1 10*3/uL — ABNORMAL HIGH (ref 0.1–0.9)
NEUT#: 5.1 10*3/uL (ref 1.5–6.5)
Platelets: 120 10*3/uL — ABNORMAL LOW (ref 140–400)
RBC: 3.97 10*6/uL — ABNORMAL LOW (ref 4.20–5.82)
RDW: 13.9 % (ref 11.0–14.6)
WBC: 9.9 10*3/uL (ref 4.0–10.3)
lymph#: 3.4 10*3/uL — ABNORMAL HIGH (ref 0.9–3.3)

## 2012-12-13 NOTE — Progress Notes (Signed)
[facility]  Telephone:(336) [phone] Fax:(336) [phone]   OFFICE PROGRESS NOTE   Cc:  Gerald Shin, MD  DIAGNOSIS:  Chronic mild thrombocytopenia, NOS.   CURRENT THERAPY: watchful observation.   INTERVAL HISTORY: [name] 79 y.o. male returns for regular follow up.  He still split his time between [facility] and [place].  He has mild right shoulder discomfort which he attributes to OA.  He is still able to play tennis and has no limitation with range of motion.  He denied skin rash, bleeding, fever, anorexia, weight loss, node swelling, recurrent infection.  The rest of the 14-point review of system was negative.       Past Medical History  Diagnosis Date  . PROSTATE CANCER 11/07/2007  . THYROID NODULE, RIGHT 11/13/2008  . DIABETES MELLITUS, TYPE II 02/04/2007  . HYPERLIPIDEMIA 12/04/2007  . UNSPECIFIED ANEMIA 11/07/2007  . THROMBOCYTOPENIA 04/17/2008  . DEPRESSION 02/04/2007  . OBSTRUCTIVE SLEEP APNEA 11/05/2009    -PSG 01/05/10 RDI 11, PLMI 56  . GLAUCOMA 03/08/2009  . HYPERTENSION 02/04/2007  . BRADYCARDIA 11/13/2008  . DIVERTICULOSIS, COLON 02/04/2007  . RENAL INSUFFICIENCY 11/13/2008  . OSTEOARTHRITIS 02/04/2007  . COLONIC POLYPS, HX OF 02/04/2007  . FOREIGN BODY, ASPIRATION 12/04/2007  . SYNCOPE 12/31/2009    Past Surgical History  Procedure Laterality Date  . Prostatectomy  1997  . Heart cartherization  2000  . Tonsillectomy  1940's    Current Outpatient Prescriptions  Medication Sig Dispense Refill  . aspirin 81 MG tablet Take 81 mg by mouth daily.        . Cholecalciferol (VITAMIN D) 2000 UNITS CAPS Take 1 capsule by mouth daily.        . citalopram (CELEXA) 20 MG tablet Take 1 tablet (20 mg total) by mouth daily.  30 tablet  3  . lisinopril (PRINIVIL,ZESTRIL) 40 MG tablet Take 1 tablet (40 mg total) by mouth daily.  30 tablet  11  . rosuvastatin (CRESTOR) 40 MG tablet Take 1 tablet (40 mg total) by mouth daily.  30 tablet  11  . Thiamine HCl (VITAMIN B-1) 100 MG  tablet Take 100 mg by mouth daily.        Marland Kitchen zolpidem (AMBIEN) 5 MG tablet Take 1 tablet (5 mg total) by mouth at bedtime as needed for sleep.  30 tablet  5   No current facility-administered medications for this visit.    ALLERGIES:  is allergic to sulfonamide derivatives.  REVIEW OF SYSTEMS:  The rest of the 14-point review of system was negative.   Filed Vitals:   12/13/12 0848  BP: 108/57  Pulse: 58  Temp: 97 F (36.1 C)  Resp: 17   Wt Readings from Last 3 Encounters:  12/13/12 197 lb 3.2 oz (89.449 kg)  11/17/12 199 lb (90.266 kg)  11/08/12 193 lb (87.544 kg)   ECOG Performance status: 0  PHYSICAL EXAMINATION:   General:  well-nourished man, in no acute distress.  Eyes:  no scleral icterus.  ENT:  There were no oropharyngeal lesions.  Neck was without thyromegaly.  Lymphatics:  Negative cervical, supraclavicular or axillary adenopathy.  Respiratory: lungs were clear bilaterally without wheezing or crackles.  Cardiovascular:  Regular rate and rhythm, S1/S2, without murmur, rub or gallop.  There was no pedal edema.  GI:  abdomen was soft, flat, nontender, nondistended, without organomegaly.  Muscoloskeletal:  no spinal tenderness of palpation of vertebral spine.  Skin exam was without echymosis, petichae.  Neuro exam was nonfocal.  Patient was able to get on and off exam table without assistance.  Gait was normal.  Patient was alert and oriented.  Attention was good.   Language was appropriate.  Mood was normal without depression.  Speech was not pressured.  Thought content was not tangential.      LABORATORY/RADIOLOGY DATA:  Lab Results  Component Value Date   WBC 9.9 12/13/2012   HGB 13.2 12/13/2012   HCT 38.2* 12/13/2012   PLT 120* 12/13/2012   GLUCOSE 78 11/14/2012   CHOL 120 03/28/2012   TRIG 76.0 03/28/2012   HDL 37.50* 03/28/2012   LDLCALC 67 03/28/2012   ALKPHOS 45 03/28/2012   ALT 25 03/28/2012   AST 36 03/28/2012   NA 141 11/14/2012   K 4.4 11/14/2012   CL 108 11/14/2012    CREATININE 1.6* 11/14/2012   BUN 32* 11/14/2012   CO2 28 11/14/2012   PSA 0.00 Repeated and verified X2.* 03/28/2012   HGBA1C 5.8 11/14/2012   MICROALBUR 5.5* 03/28/2012     ASSESSMENT AND PLAN:   Chronic, mild thrombocytopenia:  Most likely benign mild ITP or statin-induced.  It has been stable for a few years.  He does not have leukopenia or anemia.  There is low clinical suspicion for primary bone marrow failure.  There is no indication for extensive work up as past work up were negative.  I recommend discharging from the [facility].  I recommended that his PCP check his CBC about once yearly.  In the future, if his thrombocytopenia persistently worsens than 80 or he develops pancytopenia, then we can see him again.    [name] expressed informed understanding and agreed with the stated plan.       The length of time of the face-to-face encounter was 10 minutes. More than 50% of time was spent counseling and coordination of care.

## 2012-12-20 ENCOUNTER — Encounter: Payer: Self-pay | Admitting: Sports Medicine

## 2012-12-20 ENCOUNTER — Ambulatory Visit (INDEPENDENT_AMBULATORY_CARE_PROVIDER_SITE_OTHER): Payer: Medicare Other | Admitting: Sports Medicine

## 2012-12-20 VITALS — BP 129/73 | HR 52 | Ht 73.0 in | Wt 197.0 lb

## 2012-12-20 DIAGNOSIS — M79609 Pain in unspecified limb: Secondary | ICD-10-CM

## 2012-12-20 DIAGNOSIS — M79671 Pain in right foot: Secondary | ICD-10-CM

## 2012-12-20 DIAGNOSIS — M7520 Bicipital tendinitis, unspecified shoulder: Secondary | ICD-10-CM | POA: Diagnosis not present

## 2012-12-20 DIAGNOSIS — M7521 Bicipital tendinitis, right shoulder: Secondary | ICD-10-CM

## 2012-12-20 NOTE — Patient Instructions (Addendum)
Continue your exercises, you are doing great.

## 2012-12-20 NOTE — Progress Notes (Signed)
Patient ID: [name], male   DOB: [birth year], 79 y.o.   MRN: [id number]  S 79yo here for fu of R shoulder and ankle pain.  Shoulder pain is 75% improved, especially at night. Still pain with overhead swing in tennis. Doing exercises 1-2 times per day.   Ankle pain completely improved. Decreased intensity on ankle exercises due to improvement. Changed to softer backed shoe and decreased Tennis to twice weekly.   O Fit elderly male  RT shoulder:  Positive Speeds and Yergason test for pain, but not weakness. Negative Empty can except for mild pain on testing. Minimal pain and weakness of infraspinatus.    Neck decreased rotation 45* to R 35* to L, Facets 25* on L 15* on R  Ankle- no tenderness over Haglund's deformity. Minimal medial tenderness of AT.   A Small biceps tendon tear see previously on [place], with infraspinatus weakness  P Continue Shoulder and neck exercises and previous. Followup as needed.  If planning to increase tennis, consider changes in shoes and noted.

## 2012-12-20 NOTE — Assessment & Plan Note (Signed)
He has some mild Achilles tenderness but has responded very well to exercises with no pain  Haglund deformity is no longer tender after she has changed to shoes that have a softer heel counter  Reck prn

## 2012-12-20 NOTE — Assessment & Plan Note (Signed)
This continues to improve and we will keep him on home exercises  If his symptoms return he is to come back for followup

## 2012-12-26 ENCOUNTER — Telehealth: Payer: Self-pay

## 2012-12-28 ENCOUNTER — Other Ambulatory Visit: Payer: Self-pay | Admitting: Endocrinology

## 2012-12-28 NOTE — Telephone Encounter (Signed)
Rx completed and faxed.

## 2013-02-27 ENCOUNTER — Telehealth: Payer: Self-pay | Admitting: Endocrinology

## 2013-02-27 NOTE — Telephone Encounter (Signed)
No need, only one

## 2013-02-27 NOTE — Telephone Encounter (Signed)
Left message

## 2013-03-01 ENCOUNTER — Ambulatory Visit (INDEPENDENT_AMBULATORY_CARE_PROVIDER_SITE_OTHER): Payer: Medicare Other | Admitting: Sports Medicine

## 2013-03-01 VITALS — BP 111/62 | Ht 72.0 in | Wt 193.0 lb

## 2013-03-01 DIAGNOSIS — M25561 Pain in right knee: Secondary | ICD-10-CM

## 2013-03-01 DIAGNOSIS — M25569 Pain in unspecified knee: Secondary | ICD-10-CM

## 2013-03-02 NOTE — Progress Notes (Signed)
  Subjective:    Patient ID: [name], male    DOB: [birth year], 80 y.o.   MRN: [id number]  HPI chief complaint: Right lower leg pain  Very pleasant and very active 79 year old male comes in today complaining of 1 month of intermittent right lower leg pain. No trauma. His pain is intermittent and occurs mainly at night. Pain initially started off as a burning type of discomfort along the lateral lower leg which has now developed into more of an aching discomfort. It will occasionally awaken at night. It does not bother him during the day. It has not interfered with his ability to be active. He denies pain more proximally in the hip or knee. Denies pain in the ankle. No weakness. No low back pain. His main concern is that he may be developing arthritis in this area as he has been told in the past but he has arthritis in other parts of his body.  Past medical history and current medications are reviewed    Review of Systems     Objective:   Physical Exam Well-developed, fit appearing 79 year old male. No acute distress. Awake alert and oriented x3  Right knee: Full range of motion. No effusion. Good stability. Right lower leg: There is no tenderness to palpation or percussion along the tibia. No tenderness to palpation along the fibula. Calf is supple and nontender to palpation. No soft tissue swelling. Neurological exam: Negative straight leg raise. Strength is 5/5 both lower extremities. Sensation is intact to light-touch. Negative Tinel's over the fibular head. No atrophy.  Evaluation of his gait shows him to supinate. He is walking without a limp.       Assessment & Plan:  Intermittent right lower leg pain  I reassured the patient that this is not arthritis. His exam is rather benign today and his symptoms are not severe enough that he wants to embark on an aggressive workup or treatment. His history is suggestive of a neuropathic pathology and I've discussed the possibility of a  nighttime low-dose Neurontin if his symptoms warrant. I've given him a pair of green sports insoles and I will see him back in 4 weeks. No limitation on activity. He is instructed to notify me if symptoms acutely worsen.

## 2013-03-06 ENCOUNTER — Ambulatory Visit: Payer: Medicare Other | Admitting: Cardiovascular Disease

## 2013-03-14 ENCOUNTER — Ambulatory Visit (INDEPENDENT_AMBULATORY_CARE_PROVIDER_SITE_OTHER): Payer: Medicare Other | Admitting: Cardiovascular Disease

## 2013-03-14 ENCOUNTER — Encounter: Payer: Self-pay | Admitting: Cardiovascular Disease

## 2013-03-14 VITALS — BP 120/68 | HR 55 | Ht 72.0 in | Wt 199.0 lb

## 2013-03-14 DIAGNOSIS — I10 Essential (primary) hypertension: Secondary | ICD-10-CM | POA: Diagnosis not present

## 2013-03-14 NOTE — Patient Instructions (Addendum)
Your physician wants you to follow-up in: 1 YEAR with Dr Cooper.  You will receive a reminder letter in the mail two months in advance. If you don't receive a letter, please call our office to schedule the follow-up appointment.  Your physician recommends that you continue on your current medications as directed. Please refer to the Current Medication list given to you today.  

## 2013-03-14 NOTE — Progress Notes (Signed)
   HPI:   79 year old gentleman presenting for followup of hypertension. He has a history of syncope remotely. The patient remains physically active. He plays tennis regularly. He has no exertional symptoms. He specifically denies chest pain, chest pressure, dyspnea, edema, lightheadedness, or syncope.  Overall he feels well and has no complaints. Recent hematology eval for mild chronic thrombocytopenia reviewed and he will continue with yearly lab draws. He has no hx of bleeding or thrombotic events. He spends half of the year in [place].  Outpatient Encounter Prescriptions as of 03/14/2013  Medication Sig Dispense Refill  . aspirin 81 MG tablet Take 81 mg by mouth daily.        . Cholecalciferol (VITAMIN D) 2000 UNITS CAPS Take 1 capsule by mouth daily.        . citalopram (CELEXA) 20 MG tablet TAKE 1 TABLET EVERY DAY  90 tablet  2  . lisinopril (PRINIVIL,ZESTRIL) 40 MG tablet Take 1 tablet (40 mg total) by mouth daily.  30 tablet  11  . rosuvastatin (CRESTOR) 40 MG tablet Take 1 tablet (40 mg total) by mouth daily.  30 tablet  11  . zolpidem (AMBIEN) 5 MG tablet Take 1 tablet (5 mg total) by mouth at bedtime as needed for sleep.  30 tablet  5  . [DISCONTINUED] Thiamine HCl (VITAMIN B-1) 100 MG tablet Take 100 mg by mouth daily.         No facility-administered encounter medications on file as of 03/14/2013.    Allergies  Allergen Reactions  . Sulfonamide Derivatives     REACTION: Itch/Red Spots    Past Medical History  Diagnosis Date  . PROSTATE CANCER 11/07/2007  . THYROID NODULE, RIGHT 11/13/2008  . DIABETES MELLITUS, TYPE II 02/04/2007  . HYPERLIPIDEMIA 12/04/2007  . UNSPECIFIED ANEMIA 11/07/2007  . THROMBOCYTOPENIA 04/17/2008  . DEPRESSION 02/04/2007  . OBSTRUCTIVE SLEEP APNEA 11/05/2009    -PSG 01/05/10 RDI 11, PLMI 56  . GLAUCOMA 03/08/2009  . HYPERTENSION 02/04/2007  . BRADYCARDIA 11/13/2008  . DIVERTICULOSIS, COLON 02/04/2007  . RENAL INSUFFICIENCY 11/13/2008  . OSTEOARTHRITIS  02/04/2007  . COLONIC POLYPS, HX OF 02/04/2007  . FOREIGN BODY, ASPIRATION 12/04/2007  . SYNCOPE 12/31/2009    ROS: Negative except as per HPI  BP 120/68  Pulse 55  Ht 6' (1.829 m)  Wt 199 lb (90.266 kg)  BMI 26.98 kg/m2  PHYSICAL EXAM: Pt is alert and oriented, pleasant elderly male in NAD HEENT: normal Neck: JVP - normal, carotids 2+= without bruits Lungs: CTA bilaterally CV: RRR without murmur or gallop Abd: soft, NT, Positive BS, no hepatomegaly Ext: no C/C/E, distal pulses intact and equal Skin: warm/dry no rash  EKG:  Sinus bradycardia, HR 55 bpm, otherwise within normal limits.  ASSESSMENT AND PLAN: 1. HTN - BP well controlled. He will continue on Lisinopril. Creatinine 1.6 mg/dL which is stable. No changes.  2. Hyperlipidemia. On crestor 40 mg. Lipids excellent with chol 120, HDL 38, LDL 67.  Sherren Mocha 03/14/2013 9:45 AM

## 2013-03-16 ENCOUNTER — Telehealth: Payer: Self-pay

## 2013-03-16 DIAGNOSIS — D696 Thrombocytopenia, unspecified: Secondary | ICD-10-CM

## 2013-03-16 DIAGNOSIS — D649 Anemia, unspecified: Secondary | ICD-10-CM

## 2013-03-16 DIAGNOSIS — C61 Malignant neoplasm of prostate: Secondary | ICD-10-CM

## 2013-03-16 DIAGNOSIS — Z79899 Other long term (current) drug therapy: Secondary | ICD-10-CM

## 2013-03-16 DIAGNOSIS — E119 Type 2 diabetes mellitus without complications: Secondary | ICD-10-CM

## 2013-03-16 DIAGNOSIS — E785 Hyperlipidemia, unspecified: Secondary | ICD-10-CM

## 2013-03-16 DIAGNOSIS — I10 Essential (primary) hypertension: Secondary | ICD-10-CM

## 2013-03-16 DIAGNOSIS — N259 Disorder resulting from impaired renal tubular function, unspecified: Secondary | ICD-10-CM

## 2013-03-16 NOTE — Telephone Encounter (Signed)
i ordered

## 2013-03-16 NOTE — Telephone Encounter (Signed)
Left message

## 2013-03-16 NOTE — Telephone Encounter (Signed)
Pt would like to know if he can get labs ordered before appt on 04/03/13 [phone]

## 2013-03-21 ENCOUNTER — Ambulatory Visit: Payer: Medicare Other | Admitting: Sports Medicine

## 2013-03-21 DIAGNOSIS — D239 Other benign neoplasm of skin, unspecified: Secondary | ICD-10-CM | POA: Diagnosis not present

## 2013-03-21 DIAGNOSIS — L570 Actinic keratosis: Secondary | ICD-10-CM | POA: Diagnosis not present

## 2013-03-21 DIAGNOSIS — L821 Other seborrheic keratosis: Secondary | ICD-10-CM | POA: Diagnosis not present

## 2013-03-21 DIAGNOSIS — Z85828 Personal history of other malignant neoplasm of skin: Secondary | ICD-10-CM | POA: Diagnosis not present

## 2013-03-22 ENCOUNTER — Ambulatory Visit: Payer: Medicare Other | Admitting: Sports Medicine

## 2013-03-27 ENCOUNTER — Telehealth: Payer: Self-pay | Admitting: Endocrinology

## 2013-03-27 MED ORDER — CITALOPRAM HYDROBROMIDE 20 MG PO TABS: ORAL_TABLET | ORAL | Status: DC

## 2013-03-28 ENCOUNTER — Other Ambulatory Visit: Payer: Self-pay

## 2013-03-28 ENCOUNTER — Ambulatory Visit: Payer: Medicare Other

## 2013-03-28 DIAGNOSIS — E785 Hyperlipidemia, unspecified: Secondary | ICD-10-CM

## 2013-03-28 DIAGNOSIS — Z79899 Other long term (current) drug therapy: Secondary | ICD-10-CM

## 2013-03-28 DIAGNOSIS — C61 Malignant neoplasm of prostate: Secondary | ICD-10-CM

## 2013-03-28 DIAGNOSIS — E119 Type 2 diabetes mellitus without complications: Secondary | ICD-10-CM

## 2013-03-28 DIAGNOSIS — D649 Anemia, unspecified: Secondary | ICD-10-CM

## 2013-03-28 DIAGNOSIS — N259 Disorder resulting from impaired renal tubular function, unspecified: Secondary | ICD-10-CM | POA: Diagnosis not present

## 2013-03-28 LAB — CBC WITH DIFFERENTIAL/PLATELET
Basophils Absolute: 0 10*3/uL (ref 0.0–0.1)
Eosinophils Absolute: 0.3 10*3/uL (ref 0.0–0.7)
Hemoglobin: 13.2 g/dL (ref 13.0–17.0)
Lymphocytes Relative: 38.9 % (ref 12.0–46.0)
Lymphs Abs: 3.4 10*3/uL (ref 0.7–4.0)
MCHC: 33.2 g/dL (ref 30.0–36.0)
Neutro Abs: 3.9 10*3/uL (ref 1.4–7.7)
Neutrophils Relative %: 44.5 % (ref 43.0–77.0)
RBC: 4.07 Mil/uL — ABNORMAL LOW (ref 4.22–5.81)
RDW: 14.1 % (ref 11.5–14.6)

## 2013-03-28 LAB — URINALYSIS
Hgb urine dipstick: NEGATIVE
Ketones, ur: NEGATIVE
Leukocytes, UA: NEGATIVE
Nitrite: NEGATIVE
Specific Gravity, Urine: 1.02 (ref 1.000–1.030)
Urine Glucose: NEGATIVE
Urobilinogen, UA: 0.2 (ref 0.0–1.0)
pH: 5.5 (ref 5.0–8.0)

## 2013-03-28 LAB — MICROALBUMIN / CREATININE URINE RATIO
Creatinine,U: 77.3 mg/dL
Microalb, Ur: 5.2 mg/dL — ABNORMAL HIGH (ref 0.0–1.9)

## 2013-03-28 LAB — HEMOGLOBIN A1C: Hgb A1c MFr Bld: 6.1 % (ref 4.6–6.5)

## 2013-03-29 LAB — HEPATIC FUNCTION PANEL
AST: 24 U/L (ref 0–37)
Albumin: 3.8 g/dL (ref 3.5–5.2)
Alkaline Phosphatase: 48 U/L (ref 39–117)
Bilirubin, Direct: 0.1 mg/dL (ref 0.0–0.3)
Total Bilirubin: 0.7 mg/dL (ref 0.3–1.2)

## 2013-03-29 LAB — LIPID PANEL
Total CHOL/HDL Ratio: 3
Triglycerides: 54 mg/dL (ref 0.0–149.0)
VLDL: 10.8 mg/dL (ref 0.0–40.0)

## 2013-03-29 LAB — BASIC METABOLIC PANEL
BUN: 48 mg/dL — ABNORMAL HIGH (ref 6–23)
Calcium: 8.8 mg/dL (ref 8.4–10.5)
Chloride: 110 mEq/L (ref 96–112)
Creatinine, Ser: 1.8 mg/dL — ABNORMAL HIGH (ref 0.4–1.5)
GFR: 38.44 mL/min — ABNORMAL LOW (ref >60.00)
Potassium: 4.5 mEq/L (ref 3.5–5.1)

## 2013-03-29 LAB — PTH, INTACT AND CALCIUM
Calcium: 9.1 mg/dL (ref 8.4–10.5)
PTH: 70.2 pg/mL (ref 14.0–72.0)

## 2013-03-29 LAB — IBC PANEL: Saturation Ratios: 23.3 % (ref 20.0–50.0)

## 2013-03-29 LAB — TSH: TSH: 3.51 u[IU]/mL (ref 0.35–5.50)

## 2013-04-03 ENCOUNTER — Ambulatory Visit (INDEPENDENT_AMBULATORY_CARE_PROVIDER_SITE_OTHER): Payer: Medicare Other | Admitting: Endocrinology

## 2013-04-03 ENCOUNTER — Encounter: Payer: Self-pay | Admitting: Endocrinology

## 2013-04-03 VITALS — BP 128/72 | HR 60 | Wt 199.0 lb

## 2013-04-03 DIAGNOSIS — Z23 Encounter for immunization: Secondary | ICD-10-CM | POA: Diagnosis not present

## 2013-04-03 DIAGNOSIS — E119 Type 2 diabetes mellitus without complications: Secondary | ICD-10-CM

## 2013-04-03 NOTE — Patient Instructions (Signed)
Please come back for a "medicare wellness" appointment in 6 months.

## 2013-04-03 NOTE — Progress Notes (Signed)
Subjective:    Patient ID: [name], male    DOB: [birth year], 79 y.o.   MRN: [id number]  HPI The state of at least three ongoing medical problems is addressed today, with interval history of each noted here: Dyslipidemia: he denies weight change DM: he denies chest pain Renal insuff: he denies dysuria Past Medical History  Diagnosis Date  . PROSTATE CANCER 11/07/2007  . THYROID NODULE, RIGHT 11/13/2008  . DIABETES MELLITUS, TYPE II 02/04/2007  . HYPERLIPIDEMIA 12/04/2007  . UNSPECIFIED ANEMIA 11/07/2007  . THROMBOCYTOPENIA 04/17/2008  . DEPRESSION 02/04/2007  . OBSTRUCTIVE SLEEP APNEA 11/05/2009    -PSG 01/05/10 RDI 11, PLMI 56  . GLAUCOMA 03/08/2009  . HYPERTENSION 02/04/2007  . BRADYCARDIA 11/13/2008  . DIVERTICULOSIS, COLON 02/04/2007  . RENAL INSUFFICIENCY 11/13/2008  . OSTEOARTHRITIS 02/04/2007  . COLONIC POLYPS, HX OF 02/04/2007  . FOREIGN BODY, ASPIRATION 12/04/2007  . SYNCOPE 12/31/2009    Past Surgical History  Procedure Laterality Date  . Prostatectomy  1997  . Heart cartherization  2000  . Tonsillectomy  1940's    History   Social History  . Marital Status: Married    Spouse Name: N/A    Number of Children: N/A  . Years of Education: N/A   Occupational History  .      Retired   Social History Main Topics  . Smoking status: Former Smoker    Types: Cigarettes, Pipe    Quit date: 07/08/1993  . Smokeless tobacco: Never Used  . Alcohol Use: No  . Drug Use: No  . Sexual Activity: Not on file   Other Topics Concern  . Not on file   Social History Narrative   He plays tennis 3x a week    Current Outpatient Prescriptions on File Prior to Visit  Medication Sig Dispense Refill  . aspirin 81 MG tablet Take 81 mg by mouth daily.        . Cholecalciferol (VITAMIN D) 2000 UNITS CAPS Take 1 capsule by mouth daily.        . citalopram (CELEXA) 20 MG tablet TAKE 1 TABLET EVERY DAY  90 tablet  2  . lisinopril (PRINIVIL,ZESTRIL) 40 MG tablet Take 1 tablet (40 mg total) by  mouth daily.  30 tablet  11  . rosuvastatin (CRESTOR) 40 MG tablet Take 1 tablet (40 mg total) by mouth daily.  30 tablet  11  . zolpidem (AMBIEN) 5 MG tablet Take 1 tablet (5 mg total) by mouth at bedtime as needed for sleep.  30 tablet  5   No current facility-administered medications on file prior to visit.    Allergies  Allergen Reactions  . Sulfonamide Derivatives     REACTION: Itch/Red Spots    Family History  Problem Relation Age of Onset  . Kidney disease Father   . Heart disease Neg Hx     No FH of Coronary Artery Disease  . Cancer Neg Hx     BP 128/72  Pulse 60  Wt 199 lb (90.266 kg)  BMI 26.98 kg/m2  SpO2 98%  Review of Systems Denies decreased urinary stream and sob    Objective:   Physical Exam VITAL SIGNS:  See vs page GENERAL: no distress  Lab Results  Component Value Date   WBC 8.7 03/28/2013   HGB 13.2 03/28/2013   HCT 39.8 03/28/2013   PLT 123.0* 03/28/2013   GLUCOSE 70 03/28/2013   CHOL 106 03/28/2013   TRIG 54.0 03/28/2013   HDL 33.60* 03/28/2013  LDLCALC 62 03/28/2013   ALT 17 03/28/2013   AST 24 03/28/2013   NA 141 03/28/2013   K 4.5 03/28/2013   CL 110 03/28/2013   CREATININE 1.8* 03/28/2013   BUN 48* 03/28/2013   CO2 28 03/28/2013   TSH 3.51 03/28/2013   PSA 0.00* 03/28/2013   HGBA1C 6.1 03/28/2013   MICROALBUR 5.2* 03/28/2013      Assessment & Plan:  DM: well-controlled Dyslipidemia: well-controlled Renal insuff: stable

## 2013-06-07 DIAGNOSIS — S63509A Unspecified sprain of unspecified wrist, initial encounter: Secondary | ICD-10-CM | POA: Diagnosis not present

## 2013-06-12 ENCOUNTER — Telehealth: Payer: Self-pay

## 2013-06-12 MED ORDER — ZOLPIDEM TARTRATE 5 MG PO TABS: 5.0000 mg | ORAL_TABLET | Freq: Every evening | ORAL | Status: DC | PRN

## 2013-06-12 NOTE — Telephone Encounter (Signed)
i printed 

## 2013-06-12 NOTE — Telephone Encounter (Signed)
Faxed to pharmacy

## 2013-06-12 NOTE — Telephone Encounter (Signed)
Faxed received from [facility] requesting refill for Zolpidem patient was last seen on 04/03/2013,  Thanks!

## 2013-06-19 ENCOUNTER — Other Ambulatory Visit: Payer: Self-pay | Admitting: Endocrinology

## 2013-06-25 DIAGNOSIS — S63509A Unspecified sprain of unspecified wrist, initial encounter: Secondary | ICD-10-CM | POA: Diagnosis not present

## 2013-07-12 DIAGNOSIS — G4700 Insomnia, unspecified: Secondary | ICD-10-CM | POA: Diagnosis not present

## 2013-07-12 DIAGNOSIS — I10 Essential (primary) hypertension: Secondary | ICD-10-CM | POA: Diagnosis not present

## 2013-07-12 DIAGNOSIS — N183 Chronic kidney disease, stage 3 unspecified: Secondary | ICD-10-CM | POA: Diagnosis not present

## 2013-07-12 DIAGNOSIS — E7800 Pure hypercholesterolemia, unspecified: Secondary | ICD-10-CM | POA: Diagnosis not present

## 2013-08-06 DIAGNOSIS — S63509A Unspecified sprain of unspecified wrist, initial encounter: Secondary | ICD-10-CM | POA: Diagnosis not present

## 2013-08-09 DIAGNOSIS — H5702 Anisocoria: Secondary | ICD-10-CM | POA: Diagnosis not present

## 2013-08-09 DIAGNOSIS — D3130 Benign neoplasm of unspecified choroid: Secondary | ICD-10-CM | POA: Diagnosis not present

## 2013-08-09 DIAGNOSIS — H40019 Open angle with borderline findings, low risk, unspecified eye: Secondary | ICD-10-CM | POA: Diagnosis not present

## 2013-08-09 DIAGNOSIS — E119 Type 2 diabetes mellitus without complications: Secondary | ICD-10-CM | POA: Diagnosis not present

## 2013-10-08 NOTE — Telephone Encounter (Signed)
error 

## 2013-11-08 ENCOUNTER — Other Ambulatory Visit: Payer: Medicare Other

## 2013-11-08 ENCOUNTER — Other Ambulatory Visit: Payer: Self-pay | Admitting: Endocrinology

## 2013-11-08 ENCOUNTER — Other Ambulatory Visit (INDEPENDENT_AMBULATORY_CARE_PROVIDER_SITE_OTHER): Payer: Medicare Other

## 2013-11-08 ENCOUNTER — Other Ambulatory Visit: Payer: Self-pay | Admitting: *Deleted

## 2013-11-08 ENCOUNTER — Telehealth: Payer: Self-pay | Admitting: *Deleted

## 2013-11-08 DIAGNOSIS — D696 Thrombocytopenia, unspecified: Secondary | ICD-10-CM

## 2013-11-08 DIAGNOSIS — Z0000 Encounter for general adult medical examination without abnormal findings: Secondary | ICD-10-CM

## 2013-11-08 DIAGNOSIS — E119 Type 2 diabetes mellitus without complications: Secondary | ICD-10-CM

## 2013-11-08 DIAGNOSIS — C61 Malignant neoplasm of prostate: Secondary | ICD-10-CM

## 2013-11-08 DIAGNOSIS — I10 Essential (primary) hypertension: Secondary | ICD-10-CM | POA: Diagnosis not present

## 2013-11-08 DIAGNOSIS — E785 Hyperlipidemia, unspecified: Secondary | ICD-10-CM

## 2013-11-08 DIAGNOSIS — N259 Disorder resulting from impaired renal tubular function, unspecified: Secondary | ICD-10-CM

## 2013-11-08 LAB — URINALYSIS, ROUTINE W REFLEX MICROSCOPIC
BILIRUBIN URINE: NEGATIVE
Hgb urine dipstick: NEGATIVE
KETONES UR: NEGATIVE
LEUKOCYTES UA: NEGATIVE
Nitrite: NEGATIVE
PH: 6 (ref 5.0–8.0)
RBC / HPF: NONE SEEN (ref 0–?)
SPECIFIC GRAVITY, URINE: 1.01 (ref 1.000–1.030)
TOTAL PROTEIN, URINE-UPE24: NEGATIVE
Urine Glucose: NEGATIVE
Urobilinogen, UA: 0.2 (ref 0.0–1.0)
WBC, UA: NONE SEEN (ref 0–?)

## 2013-11-08 LAB — BASIC METABOLIC PANEL
BUN: 37 mg/dL — AB (ref 6–23)
CHLORIDE: 106 meq/L (ref 96–112)
CO2: 25 mEq/L (ref 19–32)
Calcium: 9.2 mg/dL (ref 8.4–10.5)
Creatinine, Ser: 1.7 mg/dL — ABNORMAL HIGH (ref 0.4–1.5)
GFR: 40.72 mL/min — ABNORMAL LOW (ref >60.00)
Glucose, Bld: 83 mg/dL (ref 70–99)
Potassium: 4.6 mEq/L (ref 3.5–5.1)
Sodium: 138 mEq/L (ref 135–145)

## 2013-11-08 LAB — CBC
HCT: 40 % (ref 39.0–52.0)
HEMOGLOBIN: 13.6 g/dL (ref 13.0–17.0)
MCHC: 34 g/dL (ref 30.0–36.0)
MCV: 98.2 fl (ref 78.0–100.0)
PLATELETS: 113 10*3/uL — AB (ref 150.0–400.0)
RBC: 4.07 Mil/uL — ABNORMAL LOW (ref 4.22–5.81)
RDW: 14.1 % (ref 11.5–15.5)
WBC: 9.4 10*3/uL (ref 4.0–10.5)

## 2013-11-08 LAB — PSA: PSA: 0 ng/mL — AB (ref 0.10–4.00)

## 2013-11-08 LAB — HEMOGLOBIN A1C: Hgb A1c MFr Bld: 6 % (ref 4.6–6.5)

## 2013-11-08 NOTE — Telephone Encounter (Signed)
Ok, i have ordered

## 2013-11-08 NOTE — Telephone Encounter (Signed)
Lab at [facility] called stating the pt requested to have his PSA and urine checked as well as the other labs. Pt stated he wants them checked. Please advise.

## 2013-11-13 ENCOUNTER — Telehealth: Payer: Self-pay | Admitting: Endocrinology

## 2013-11-13 ENCOUNTER — Ambulatory Visit (INDEPENDENT_AMBULATORY_CARE_PROVIDER_SITE_OTHER): Payer: Medicare Other | Admitting: Endocrinology

## 2013-11-13 ENCOUNTER — Encounter: Payer: Self-pay | Admitting: Endocrinology

## 2013-11-13 VITALS — BP 116/60 | HR 50 | Temp 97.7°F | Ht 72.0 in | Wt 199.0 lb

## 2013-11-13 DIAGNOSIS — E119 Type 2 diabetes mellitus without complications: Secondary | ICD-10-CM | POA: Diagnosis not present

## 2013-11-13 DIAGNOSIS — N259 Disorder resulting from impaired renal tubular function, unspecified: Secondary | ICD-10-CM

## 2013-11-13 DIAGNOSIS — Z79899 Other long term (current) drug therapy: Secondary | ICD-10-CM

## 2013-11-13 LAB — HEPATIC FUNCTION PANEL
ALBUMIN: 4.2 g/dL (ref 3.5–5.2)
ALT: 16 U/L (ref 0–53)
AST: 33 U/L (ref 0–37)
Alkaline Phosphatase: 52 U/L (ref 39–117)
BILIRUBIN DIRECT: 0.1 mg/dL (ref 0.0–0.3)
Total Bilirubin: 0.7 mg/dL (ref 0.2–1.2)
Total Protein: 6.9 g/dL (ref 6.0–8.3)

## 2013-11-13 MED ORDER — LISINOPRIL 20 MG PO TABS: 20.0000 mg | ORAL_TABLET | Freq: Every day | ORAL | Status: DC

## 2013-11-13 MED ORDER — TERBINAFINE HCL 250 MG PO TABS: 250.0000 mg | ORAL_TABLET | Freq: Every day | ORAL | Status: DC

## 2013-11-13 NOTE — Telephone Encounter (Signed)
Pt wants to go to [place] on 03/13/14 to get the labs done

## 2013-11-13 NOTE — Telephone Encounter (Signed)
Pt would like to have the lab orders put in now for his lab appt that is supposed to be in sept of this year.

## 2013-11-13 NOTE — Patient Instructions (Addendum)
i have sent a prescription to your pharmacy, for the toenail fungus. blood tests are being requested for you today.  We'll contact you with results. Please come back for a "medicare wellness" appointment in 6 months.   Please reduce the lisinopril to 20 mg daily.

## 2013-11-13 NOTE — Progress Notes (Signed)
Subjective:    Patient ID: [name], male    DOB: Aug 28, 1927, 80 y.o.   MRN: [id number]  HPI Pt reports few years of moderate thickening of all toenails, but no assoc ulcer. Pt was noted at [facility] to have hyperkalemia. Past Medical History  Diagnosis Date  . PROSTATE CANCER 11/07/2007  . THYROID NODULE, RIGHT 11/13/2008  . DIABETES MELLITUS, TYPE II 02/04/2007  . HYPERLIPIDEMIA 12/04/2007  . UNSPECIFIED ANEMIA 11/07/2007  . THROMBOCYTOPENIA 04/17/2008  . DEPRESSION 02/04/2007  . OBSTRUCTIVE SLEEP APNEA 11/05/2009    -PSG 01/05/10 RDI 11, PLMI 56  . GLAUCOMA 03/08/2009  . HYPERTENSION 02/04/2007  . BRADYCARDIA 11/13/2008  . DIVERTICULOSIS, COLON 02/04/2007  . RENAL INSUFFICIENCY 11/13/2008  . OSTEOARTHRITIS 02/04/2007  . COLONIC POLYPS, HX OF 02/04/2007  . FOREIGN BODY, ASPIRATION 12/04/2007  . SYNCOPE 12/31/2009    Past Surgical History  Procedure Laterality Date  . Prostatectomy  1997  . Heart cartherization  2000  . Tonsillectomy  1940's    History   Social History  . Marital Status: Married    Spouse Name: N/A    Number of Children: N/A  . Years of Education: N/A   Occupational History  .      Retired   Social History Main Topics  . Smoking status: Former Smoker    Types: Cigarettes, Pipe    Quit date: 07/08/1993  . Smokeless tobacco: Never Used  . Alcohol Use: No  . Drug Use: No  . Sexual Activity: Not on file   Other Topics Concern  . Not on file   Social History Narrative   He plays tennis 3x a week    Current Outpatient Prescriptions on File Prior to Visit  Medication Sig Dispense Refill  . aspirin 81 MG tablet Take 81 mg by mouth daily.        . Cholecalciferol (VITAMIN D) 2000 UNITS CAPS Take 1 capsule by mouth daily.        . citalopram (CELEXA) 20 MG tablet TAKE 1 TABLET EVERY DAY  90 tablet  2  . CRESTOR 40 MG tablet TAKE 1 TABLET EVERY DAY  90 tablet  11  . zolpidem (AMBIEN) 5 MG tablet Take 1 tablet (5 mg total) by mouth at bedtime as needed for sleep.   30 tablet  5   No current facility-administered medications on file prior to visit.    Allergies  Allergen Reactions  . Sulfonamide Derivatives     REACTION: Itch/Red Spots    Family History  Problem Relation Age of Onset  . Kidney disease Father   . Heart disease Neg Hx     No FH of Coronary Artery Disease  . Cancer Neg Hx     BP 116/60  Pulse 50  Temp(Src) 97.7 F (36.5 C) (Oral)  Ht 6' (1.829 m)  Wt 199 lb (90.266 kg)  BMI 26.98 kg/m2  SpO2 95%  Review of Systems Denies toe pain and muscle weakness    Objective:   Physical Exam VITAL SIGNS:  See vs page GENERAL: no distress Pulses: dorsalis pedis intact bilat.   Feet: no deformity. normal color and temp.  no edema.  There is bilateral onychomycosis Skin:  no ulcer on the feet.   Neuro: sensation is intact to touch on the feet      outside test results are reviewed: K=5.5 Lab Results  Component Value Date   WBC 9.4 11/08/2013   HGB 13.6 11/08/2013   HCT 40.0 11/08/2013  PLT 113.0* 11/08/2013   GLUCOSE 83 11/08/2013   CHOL 106 03/28/2013   TRIG 54.0 03/28/2013   HDL 33.60* 03/28/2013   LDLCALC 62 03/28/2013   ALT 16 11/13/2013   AST 33 11/13/2013   NA 138 11/08/2013   K 4.6 11/08/2013   CL 106 11/08/2013   CREATININE 1.7* 11/08/2013   BUN 37* 11/08/2013   CO2 25 11/08/2013   TSH 3.51 03/28/2013   PSA 0.00* 11/08/2013   HGBA1C 6.0 11/08/2013   MICROALBUR 5.2* 03/28/2013      Assessment & Plan:  Hyperkalemia: new, due to acei Onychomycosis, persistent. Renal failure: persistent Thrombocytopenia: persistent DM: well-controlled   Patient Instructions  i have sent a prescription to your pharmacy, for the toenail fungus. blood tests are being requested for you today.  We'll contact you with results. Please come back for a "medicare wellness" appointment in 6 months.   Please reduce the lisinopril to 20 mg daily.

## 2013-11-14 NOTE — Telephone Encounter (Signed)
Done

## 2013-11-20 DIAGNOSIS — L821 Other seborrheic keratosis: Secondary | ICD-10-CM | POA: Diagnosis not present

## 2013-11-20 DIAGNOSIS — Z85828 Personal history of other malignant neoplasm of skin: Secondary | ICD-10-CM | POA: Diagnosis not present

## 2013-11-20 DIAGNOSIS — D239 Other benign neoplasm of skin, unspecified: Secondary | ICD-10-CM | POA: Diagnosis not present

## 2013-11-20 DIAGNOSIS — B351 Tinea unguium: Secondary | ICD-10-CM | POA: Diagnosis not present

## 2013-11-20 DIAGNOSIS — L905 Scar conditions and fibrosis of skin: Secondary | ICD-10-CM | POA: Diagnosis not present

## 2013-11-28 ENCOUNTER — Telehealth: Payer: Self-pay | Admitting: Endocrinology

## 2013-11-28 MED ORDER — ZOLPIDEM TARTRATE 5 MG PO TABS: 5.0000 mg | ORAL_TABLET | Freq: Every evening | ORAL | Status: DC | PRN

## 2013-11-28 NOTE — Telephone Encounter (Signed)
See below, Thanks!  

## 2013-11-28 NOTE — Telephone Encounter (Signed)
Rx faxed to pharmacy  

## 2013-11-28 NOTE — Telephone Encounter (Signed)
i printed 

## 2013-11-28 NOTE — Telephone Encounter (Signed)
Patient called in this am stating he needs a refill [facility]  Zolpigem 5 mg and would like to know if they can do 90 qty  Pharmacy fax number [phone]  Thank You :)

## 2013-11-29 ENCOUNTER — Encounter: Payer: Self-pay | Admitting: Endocrinology

## 2014-02-11 ENCOUNTER — Encounter: Payer: Self-pay | Admitting: Internal Medicine

## 2014-03-12 ENCOUNTER — Encounter: Payer: Self-pay | Admitting: Endocrinology

## 2014-03-12 ENCOUNTER — Telehealth: Payer: Self-pay | Admitting: Endocrinology

## 2014-03-12 ENCOUNTER — Ambulatory Visit (INDEPENDENT_AMBULATORY_CARE_PROVIDER_SITE_OTHER): Payer: Medicare Other | Admitting: Endocrinology

## 2014-03-12 VITALS — BP 116/50 | HR 54 | Temp 97.6°F | Ht 72.0 in | Wt 202.0 lb

## 2014-03-12 DIAGNOSIS — Z79899 Other long term (current) drug therapy: Secondary | ICD-10-CM

## 2014-03-12 DIAGNOSIS — E119 Type 2 diabetes mellitus without complications: Secondary | ICD-10-CM

## 2014-03-12 DIAGNOSIS — N259 Disorder resulting from impaired renal tubular function, unspecified: Secondary | ICD-10-CM

## 2014-03-12 DIAGNOSIS — R109 Unspecified abdominal pain: Secondary | ICD-10-CM | POA: Insufficient documentation

## 2014-03-12 LAB — LIPID PANEL
CHOL/HDL RATIO: 4
Cholesterol: 118 mg/dL (ref 0–200)
HDL: 29.7 mg/dL — AB (ref >39.00)
LDL Cholesterol: 66 mg/dL (ref 0–99)
NonHDL: 88.3
Triglycerides: 113 mg/dL (ref 0.0–149.0)
VLDL: 22.6 mg/dL (ref 0.0–40.0)

## 2014-03-12 LAB — TSH: TSH: 0.91 u[IU]/mL (ref 0.35–4.50)

## 2014-03-12 LAB — URINALYSIS, ROUTINE W REFLEX MICROSCOPIC
Bilirubin Urine: NEGATIVE
Hgb urine dipstick: NEGATIVE
KETONES UR: NEGATIVE
Leukocytes, UA: NEGATIVE
NITRITE: NEGATIVE
PH: 5.5 (ref 5.0–8.0)
RBC / HPF: NONE SEEN (ref 0–?)
SPECIFIC GRAVITY, URINE: 1.01 (ref 1.000–1.030)
Total Protein, Urine: NEGATIVE
Urine Glucose: NEGATIVE
Urobilinogen, UA: 0.2 (ref 0.0–1.0)
WBC, UA: NONE SEEN (ref 0–?)

## 2014-03-12 LAB — HEMOGLOBIN A1C: HEMOGLOBIN A1C: 5.9 % (ref 4.6–6.5)

## 2014-03-12 LAB — BASIC METABOLIC PANEL
BUN: 34 mg/dL — ABNORMAL HIGH (ref 6–23)
CALCIUM: 9.2 mg/dL (ref 8.4–10.5)
CO2: 27 mEq/L (ref 19–32)
Chloride: 106 mEq/L (ref 96–112)
Creatinine, Ser: 1.9 mg/dL — ABNORMAL HIGH (ref 0.4–1.5)
GFR: 36.93 mL/min — AB (ref >60.00)
Glucose, Bld: 113 mg/dL — ABNORMAL HIGH (ref 70–99)
POTASSIUM: 4.9 meq/L (ref 3.5–5.1)
Sodium: 139 mEq/L (ref 135–145)

## 2014-03-12 LAB — AMYLASE: AMYLASE: 42 U/L (ref 27–131)

## 2014-03-12 MED ORDER — LANSOPRAZOLE 30 MG PO CPDR: 30.0000 mg | DELAYED_RELEASE_CAPSULE | Freq: Every day | ORAL | Status: DC

## 2014-03-12 NOTE — Patient Instructions (Addendum)
Please see a stomach specialist.  you will receive a phone call, about a day and time for an appointment.  i have sent a prescription to your pharmacy, for a pill for the stomach.  blood tests are being requested for you today.  We'll contact you with results.  I hope you feel better soon.  If you don't feel better by next week, please call back.  if you get worse, go to the emergency room.

## 2014-03-12 NOTE — Telephone Encounter (Signed)
please call patient and pcc: Please also do [place] tomorrow am.  i have ordered

## 2014-03-12 NOTE — Progress Notes (Signed)
Subjective:    Patient ID: [name], male    DOB: 11-02-1927, 80 y.o.   MRN: [id number]  HPI Pt states few days of intermittent moderate burning sensation in the periumbilical area.  He has intermittent assoc low-grade fever. Past Medical History  Diagnosis Date  . PROSTATE CANCER 11/07/2007  . THYROID NODULE, RIGHT 11/13/2008  . DIABETES MELLITUS, TYPE II 02/04/2007  . HYPERLIPIDEMIA 12/04/2007  . UNSPECIFIED ANEMIA 11/07/2007  . THROMBOCYTOPENIA 04/17/2008  . DEPRESSION 02/04/2007  . OBSTRUCTIVE SLEEP APNEA 11/05/2009    -PSG 01/05/10 RDI 11, PLMI 56  . GLAUCOMA 03/08/2009  . HYPERTENSION 02/04/2007  . BRADYCARDIA 11/13/2008  . DIVERTICULOSIS, COLON 02/04/2007  . RENAL INSUFFICIENCY 11/13/2008  . OSTEOARTHRITIS 02/04/2007  . COLONIC POLYPS, HX OF 02/04/2007  . FOREIGN BODY, ASPIRATION 12/04/2007  . SYNCOPE 12/31/2009    Past Surgical History  Procedure Laterality Date  . Prostatectomy  1997  . Heart cartherization  2000  . Tonsillectomy  1940's    History   Social History  . Marital Status: Married    Spouse Name: N/A    Number of Children: N/A  . Years of Education: N/A   Occupational History  .      Retired   Social History Main Topics  . Smoking status: Former Smoker    Types: Cigarettes, Pipe    Quit date: 07/08/1993  . Smokeless tobacco: Never Used  . Alcohol Use: No  . Drug Use: No  . Sexual Activity: Not on file   Other Topics Concern  . Not on file   Social History Narrative   He plays tennis 3x a week    Current Outpatient Prescriptions on File Prior to Visit  Medication Sig Dispense Refill  . aspirin 81 MG tablet Take 81 mg by mouth daily.        . Cholecalciferol (VITAMIN D) 2000 UNITS CAPS Take 1 capsule by mouth daily.        . citalopram (CELEXA) 20 MG tablet TAKE 1 TABLET EVERY DAY  90 tablet  2  . CRESTOR 40 MG tablet TAKE 1 TABLET EVERY DAY  90 tablet  11  . lisinopril (PRINIVIL,ZESTRIL) 20 MG tablet Take 1 tablet (20 mg total) by mouth daily.   90 tablet  3  . terbinafine (LAMISIL) 250 MG tablet Take 1 tablet (250 mg total) by mouth daily.  90 tablet  1  . zolpidem (AMBIEN) 5 MG tablet Take 1 tablet (5 mg total) by mouth at bedtime as needed for sleep.  90 tablet  1   No current facility-administered medications on file prior to visit.    Allergies  Allergen Reactions  . Sulfonamide Derivatives     REACTION: Itch/Red Spots    Family History  Problem Relation Age of Onset  . Kidney disease Father   . Heart disease Neg Hx     No FH of Coronary Artery Disease  . Cancer Neg Hx     BP 116/50  Pulse 54  Temp(Src) 97.6 F (36.4 C) (Oral)  Ht 6' (1.829 m)  Wt 202 lb (91.627 kg)  BMI 27.39 kg/m2  SpO2 97%    Review of Systems Denies n/v/d    Objective:   Physical Exam VITAL SIGNS:  See vs page GENERAL: no distress ABDOMEN: abdomen is soft, nontender.  no hepatosplenomegaly. not distended.  no hernia   Lab Results  Component Value Date   WBC 9.4 11/08/2013   HGB 13.6 11/08/2013   HCT 40.0  11/08/2013   PLT 113.0* 11/08/2013   GLUCOSE 83 11/08/2013   CHOL 106 03/28/2013   TRIG 54.0 03/28/2013   HDL 33.60* 03/28/2013   LDLCALC 62 03/28/2013   ALT 24 03/12/2014   AST 28 03/12/2014   NA 138 11/08/2013   K 4.6 11/08/2013   CL 106 11/08/2013   CREATININE 1.7* 11/08/2013   BUN 37* 11/08/2013   CO2 25 11/08/2013   TSH 3.51 03/28/2013   PSA 0.00* 11/08/2013   HGBA1C 6.0 11/08/2013   MICROALBUR 5.2* 03/28/2013      Assessment & Plan:  abd pain, new, uncertain etiology.  [place] is ordered Renal insuff, due for recheck DM: due for recheck.   Patient is advised the following: Patient Instructions  Please see a stomach specialist.  you will receive a phone call, about a day and time for an appointment.  i have sent a prescription to your pharmacy, for a pill for the stomach.  blood tests are being requested for you today.  We'll contact you with results.  I hope you feel better soon.  If you don't feel better by next week,  please call back.  if you get worse, go to the emergency room.

## 2014-03-12 NOTE — Telephone Encounter (Signed)
[facility] notified and pt informed. Waiting on response from [facility].

## 2014-03-13 ENCOUNTER — Other Ambulatory Visit: Payer: Medicare Other

## 2014-03-13 ENCOUNTER — Ambulatory Visit (HOSPITAL_COMMUNITY)
Admission: RE | Admit: 2014-03-13 | Discharge: 2014-03-13 | Disposition: A | Discharge status: Home / Self Care | Payer: Medicare Other | Source: Ambulatory Visit | Attending: Endocrinology | Admitting: Endocrinology

## 2014-03-13 ENCOUNTER — Telehealth: Payer: Self-pay | Admitting: Endocrinology

## 2014-03-13 ENCOUNTER — Other Ambulatory Visit: Payer: Self-pay | Admitting: Endocrinology

## 2014-03-13 DIAGNOSIS — R141 Gas pain: Secondary | ICD-10-CM | POA: Diagnosis not present

## 2014-03-13 DIAGNOSIS — E119 Type 2 diabetes mellitus without complications: Secondary | ICD-10-CM | POA: Insufficient documentation

## 2014-03-13 DIAGNOSIS — K8020 Calculus of gallbladder without cholecystitis without obstruction: Secondary | ICD-10-CM | POA: Diagnosis not present

## 2014-03-13 DIAGNOSIS — R109 Unspecified abdominal pain: Secondary | ICD-10-CM | POA: Insufficient documentation

## 2014-03-13 LAB — HEPATIC FUNCTION PANEL (6)
ALBUMIN: 4.3 g/dL (ref 3.5–4.7)
ALT: 24 IU/L (ref 0–44)
AST: 28 IU/L (ref 0–40)
Alkaline Phosphatase: 56 IU/L (ref 39–117)
Bilirubin, Direct: 0.1 mg/dL (ref 0.00–0.40)
Total Bilirubin: 0.3 mg/dL (ref 0.0–1.2)

## 2014-03-13 MED ORDER — CIPROFLOXACIN HCL 500 MG PO TABS: 500.0000 mg | ORAL_TABLET | Freq: Two times a day (BID) | ORAL | Status: DC

## 2014-03-13 NOTE — Telephone Encounter (Signed)
please call lab: i really need rest of blood results.  What is the progress?

## 2014-03-13 NOTE — Telephone Encounter (Signed)
Called the lab and they state the blood has been resulted but they are having computer issues and have not been able to put the results into epic. Santiago Glad who I spoke with stated they should be put into epic by 6 pm today.

## 2014-03-14 LAB — CBC WITH DIFFERENTIAL/PLATELET
BASOS ABS: 0 10*3/uL (ref 0.0–0.1)
Basophils Relative: 0.4 % (ref 0.0–3.0)
EOS PCT: 2.3 % (ref 0.0–5.0)
Eosinophils Absolute: 0.2 10*3/uL (ref 0.0–0.7)
HCT: 38.7 % — ABNORMAL LOW (ref 39.0–52.0)
HEMOGLOBIN: 13.2 g/dL (ref 13.0–17.0)
LYMPHS PCT: 21 % (ref 12.0–46.0)
Lymphs Abs: 1.5 10*3/uL (ref 0.7–4.0)
MCHC: 34 g/dL (ref 30.0–36.0)
MCV: 97.6 fl (ref 78.0–100.0)
Monocytes Absolute: 1.6 10*3/uL — ABNORMAL HIGH (ref 0.1–1.0)
Monocytes Relative: 23.6 % — ABNORMAL HIGH (ref 3.0–12.0)
Neutro Abs: 3.7 10*3/uL (ref 1.4–7.7)
Neutrophils Relative %: 52.7 % (ref 43.0–77.0)
RBC: 3.97 Mil/uL — ABNORMAL LOW (ref 4.22–5.81)
RDW: 14.4 % (ref 11.5–15.5)
WBC: 6.9 10*3/uL (ref 4.0–10.5)

## 2014-03-15 ENCOUNTER — Telehealth: Payer: Self-pay | Admitting: Internal Medicine

## 2014-03-15 NOTE — Telephone Encounter (Signed)
Pt states he has been having abdominal pain and would like to be seen sooner than 1st available. Pt scheduled to see Tye Savoy NP 03/21/14@2pm . Pt aware of appt.

## 2014-03-18 DIAGNOSIS — L820 Inflamed seborrheic keratosis: Secondary | ICD-10-CM | POA: Diagnosis not present

## 2014-03-18 DIAGNOSIS — Z85828 Personal history of other malignant neoplasm of skin: Secondary | ICD-10-CM | POA: Diagnosis not present

## 2014-03-19 ENCOUNTER — Ambulatory Visit: Payer: Medicare Other | Admitting: Endocrinology

## 2014-03-21 ENCOUNTER — Ambulatory Visit (INDEPENDENT_AMBULATORY_CARE_PROVIDER_SITE_OTHER): Payer: Medicare Other | Admitting: Nurse Practitioner

## 2014-03-21 ENCOUNTER — Encounter: Payer: Self-pay | Admitting: Nurse Practitioner

## 2014-03-21 VITALS — BP 100/50 | HR 56 | Ht 71.0 in | Wt 201.1 lb

## 2014-03-21 DIAGNOSIS — R1013 Epigastric pain: Secondary | ICD-10-CM

## 2014-03-21 NOTE — Progress Notes (Signed)
HPI :  Patient is an 80 year male known to Dr. Henrene Pastor for history of adenomatous colon polyps. No polyps, only diverticulosis found at time of last surveillance colonoscopy in 2009. Patient is referred by PCP for evaluation of periumbilical pain. Labs on the fifteenth of this month were unremarkable. Patient has chronic kidney disease and renal function was at baseline. Liver function studies were normal. Amylase normal. CBC was also at baseline. Hemoglobin 13.2. Platelets were low at 104 but this is not unusual for him. Urinalysis unremarkable. Abdominal ultrasound positive for cholelithiasis and borderline gallbladder wall thickening.  The Sunday before last patient was in [place] visiting his daughter for the [place] holiday. While sitting in the temple he suddenly developed mid upper abdominal burning and shaking in his left hand. Patient took Advil, went to bed and felt better the next morning. Later that night however he had recurrent mid upper abdominal burning. Patient returned to [facility] and saw his PCP who prescribed Prevacid. and obtained an ultrasound. Since that time patient has had an additional (milder) episode of pain.. These episodes of abdominal discomfort have not been associated with nausea. No bowel changes. No urinary symptoms.   Patient is worried about having an ulcer. During times of increased stress he has noted some abdominal burning and is worried that stress could have caused an ulcer.   Past Medical History  Diagnosis Date  . PROSTATE CANCER 11/07/2007  . THYROID NODULE, RIGHT 11/13/2008  . DIABETES MELLITUS, TYPE II 02/04/2007  . HYPERLIPIDEMIA 12/04/2007  . UNSPECIFIED ANEMIA 11/07/2007  . THROMBOCYTOPENIA 04/17/2008  . DEPRESSION 02/04/2007  . OBSTRUCTIVE SLEEP APNEA 11/05/2009    -PSG 01/05/10 RDI 11, PLMI 56  . GLAUCOMA 03/08/2009  . HYPERTENSION 02/04/2007  . BRADYCARDIA 11/13/2008  . DIVERTICULOSIS, COLON 02/04/2007  . RENAL INSUFFICIENCY 11/13/2008  . OSTEOARTHRITIS  02/04/2007  . COLONIC POLYPS, HX OF 02/04/2007  . FOREIGN BODY, ASPIRATION 12/04/2007  . SYNCOPE 12/31/2009               Family History  Problem Relation Age of Onset  . Kidney disease Father   . Heart disease Neg Hx     No FH of Coronary Artery Disease  . Cancer Neg Hx    History  Substance Use Topics  . Smoking status: Former Smoker    Types: Cigarettes, Pipe    Quit date: 07/08/1993  . Smokeless tobacco: Never Used  . Alcohol Use: Yes     Comment: 1 shot a day   Current Outpatient Prescriptions  Medication Sig Dispense Refill  . aspirin 81 MG tablet Take 81 mg by mouth daily.        . Cholecalciferol (VITAMIN D) 2000 UNITS CAPS Take 1 capsule by mouth daily.        . citalopram (CELEXA) 20 MG tablet TAKE 1 TABLET EVERY DAY  90 tablet  2  . CRESTOR 40 MG tablet TAKE 1 TABLET EVERY DAY  90 tablet  11  . lansoprazole (PREVACID) 30 MG capsule Take 1 capsule (30 mg total) by mouth daily at 12 noon.  30 capsule  0  . lisinopril (PRINIVIL,ZESTRIL) 20 MG tablet Take 1 tablet (20 mg total) by mouth daily.  90 tablet  3  . terbinafine (LAMISIL) 250 MG tablet Take 1 tablet (250 mg total) by mouth daily.  90 tablet  1  . zolpidem (AMBIEN) 5 MG tablet Take 1 tablet (5 mg total) by mouth at bedtime as needed for sleep.  [place]  tablet  1   No current facility-administered medications for this visit.   Allergies  Allergen Reactions  . Lactose Intolerance (Gi)   . Sulfonamide Derivatives     REACTION: Itch/Red Spots   Review of Systems: All systems reviewed and negative except where noted in HPI.    US Abdomen Complete  03/13/2014   CLINICAL DATA:  Abdominal pain; history of diabetes  EXAM: ULTRASOUND ABDOMEN COMPLETE  COMPARISON:  Renal ultrasound of Nov 24, 2004  FINDINGS: Gallbladder:  The gallbladder is adequately distended and contains echogenic shadowing stones and sludge. There is borderline thickening of the wall at 3.3 mm. There is no positive sonographic Murphy's sign.  Common bile  duct:  Diameter: 3.1 mm  Liver:  No focal lesion identified. Within normal limits in parenchymal echogenicity.  IVC:  No abnormality visualized.  Pancreas:  The pancreas was obscured by bowel gas.  Spleen:  The spleen is elongated but not clearly increased in volume. Its echotexture is normal.  Right Kidney:  Length: 11.5 cm. Echogenicity within normal limits. No mass or hydronephrosis visualized.  Left Kidney:  Length: 11.1 cm. Echogenicity within normal limits. No mass or hydronephrosis visualized.  Abdominal aorta:  Bowel gas obscured the abdominal aorta  Other findings:  No ascites is demonstrated.  IMPRESSION: 1. Cholelithiasis and borderline gallbladder wall thickening without positive sonographic Murphy's sign. The findings may reflect low-grade acute cholecystitis. 2. No acute intra-abdominal abnormality is demonstrated otherwise. The pancreas and abdominal aorta were obscured by bowel gas.   Electronically Signed   By: David  Martinique   On: 03/13/2014 09:07    Physical Exam: BP 100/50  Pulse 56  Ht 5\' 11"  (1.803 m)  Wt 201 lb 2 oz (91.23 kg)  BMI 28.06 kg/m2 Constitutional: Pleasant,well-developed, white male in no acute distress. HEENT: Normocephalic and atraumatic. Conjunctivae are normal. No scleral icterus. Neck supple.  Cardiovascular: Normal rate, regular rhythm.  Pulmonary/chest: Effort normal and breath sounds normal. No wheezing, rales or rhonchi. Abdominal: Soft, nondistended, nontender. Bowel sounds active throughout. There are no masses palpable. No hepatomegaly. Extremities: no edema Lymphadenopathy: No cervical adenopathy noted. Neurological: Alert and oriented to person place and time. Skin: Skin is warm and dry. No rashes noted. Psychiatric: Normal mood and affect. Behavior is normal.   ASSESSMENT AND PLAN:  Pleasant 80 year old male here with mid upper abdominal burning. Patient is concerned about having an ulcer. He is under the misconception that stress can cause  ulcers. We discussed the most common causes of gastric ulcers such as H. Pylori and anti-inflammatories. Though patient could have a gastric ulcer it does seem unlikely based on presentation. Patient lives in [place] part of the year and he is nervous about leaving town with current symptoms. He would like to pursue workup. For further evaluation patient will be scheduled for EGD. The benefits, risks, and potential complications of EGD with possible biopsies were discussed with the patient and he agrees to proceed.

## 2014-03-21 NOTE — Patient Instructions (Signed)
You have been scheduled for an endoscopy with Dr. Henrene Pastor. Please follow written instructions given to you at your visit today. If you use inhalers (even only as needed), please bring them with you on the day of your procedure. Your physician has requested that you go to www.startemmi.com and enter the access code given to you at your visit today. This web site gives a general overview about your procedure. However, you should still follow specific instructions given to you by our office regarding your preparation for the procedure.

## 2014-03-22 ENCOUNTER — Encounter: Payer: Self-pay | Admitting: Cardiovascular Disease

## 2014-03-22 ENCOUNTER — Telehealth: Payer: Self-pay | Admitting: Endocrinology

## 2014-03-22 ENCOUNTER — Encounter: Payer: Self-pay | Admitting: Nurse Practitioner

## 2014-03-22 ENCOUNTER — Ambulatory Visit (INDEPENDENT_AMBULATORY_CARE_PROVIDER_SITE_OTHER): Payer: Medicare Other | Admitting: Cardiovascular Disease

## 2014-03-22 VITALS — BP 122/54 | HR 53 | Ht 71.0 in | Wt 200.8 lb

## 2014-03-22 DIAGNOSIS — D649 Anemia, unspecified: Secondary | ICD-10-CM | POA: Diagnosis not present

## 2014-03-22 NOTE — Telephone Encounter (Signed)
please call patient: Was rx'ed on 11/28/13, x 3 mos, with 1 refill.

## 2014-03-22 NOTE — Telephone Encounter (Signed)
Please call in zolpidem to [facility] # [phone]

## 2014-03-22 NOTE — Telephone Encounter (Signed)
See below , thanks

## 2014-03-22 NOTE — Patient Instructions (Signed)
Your physician recommends that you continue on your current medications as directed. Please refer to the Current Medication list given to you today.  Your physician wants you to follow-up in: 1 year. You will receive a reminder letter in the mail two months in advance. If you don't receive a letter, please call our office to schedule the follow-up appointment.  

## 2014-03-22 NOTE — Progress Notes (Signed)
HPI:  80 year old gentleman presenting for followup of hypertension. He has a history of syncope remotely. The patient remains physically active. He plays tennis regularly and continues to spend about 6 months/year in [place]. He has no exertional symptoms. He specifically denies chest pain, chest pressure, dyspnea, edema, lightheadedness, or syncope.   His medications have not changed. He had recent blood work with lipids at goal. The patient does have chronic kidney disease with a creatinine of 1.9. This is essentially stable as over the last 3 years his creatinine has ranged between 1.5 and 1.8.   Outpatient Encounter Prescriptions as of 03/22/2014  Medication Sig  . cyclobenzaprine (FLEXERIL) 5 MG tablet   . meloxicam (MOBIC) 7.5 MG tablet   . naftifine (NAFTIN) 1 % cream   . pioglitazone (ACTOS) 45 MG tablet   . ranitidine (ZANTAC) 150 MG capsule   . traMADol (ULTRAM) 50 MG tablet   . aspirin 81 MG tablet Take 81 mg by mouth daily.    . Cholecalciferol (VITAMIN D) 2000 UNITS CAPS Take 1 capsule by mouth daily.    . ciprofloxacin (CIPRO) 500 MG tablet   . citalopram (CELEXA) 20 MG tablet TAKE 1 TABLET EVERY DAY  . CRESTOR 40 MG tablet TAKE 1 TABLET EVERY DAY  . lansoprazole (PREVACID) 30 MG capsule Take 1 capsule (30 mg total) by mouth daily at 12 noon.  [place] lisinopril (PRINIVIL,ZESTRIL) 20 MG tablet Take 1 tablet (20 mg total) by mouth daily.  [place] Kitchen terbinafine (LAMISIL) 250 MG tablet Take 1 tablet (250 mg total) by mouth daily.  [place] Kitchen zolpidem (AMBIEN) 5 MG tablet Take 1 tablet (5 mg total) by mouth at bedtime as needed for sleep.    Allergies  Allergen Reactions  . Lactose Intolerance (Gi)   . Sulfonamide Derivatives     REACTION: Itch/Red Spots    Past Medical History  Diagnosis Date  . PROSTATE CANCER 11/07/2007  . THYROID NODULE, RIGHT 11/13/2008  . DIABETES MELLITUS, TYPE II 02/04/2007  . HYPERLIPIDEMIA 12/04/2007  . UNSPECIFIED ANEMIA 11/07/2007  . THROMBOCYTOPENIA 04/17/2008  .  DEPRESSION 02/04/2007  . OBSTRUCTIVE SLEEP APNEA 11/05/2009    -PSG 01/05/10 RDI 11, PLMI 56  . GLAUCOMA 03/08/2009  . HYPERTENSION 02/04/2007  . BRADYCARDIA 11/13/2008  . DIVERTICULOSIS, COLON 02/04/2007  . RENAL INSUFFICIENCY 11/13/2008  . OSTEOARTHRITIS 02/04/2007  . COLONIC POLYPS, HX OF 02/04/2007  . FOREIGN BODY, ASPIRATION 12/04/2007  . SYNCOPE 12/31/2009    Ht 5\' 11"  (1.803 m)  PHYSICAL EXAM: Pt is alert and oriented, pleasant elderly male in NAD HEENT: normal Neck: JVP - normal, carotids 2+= without bruits Lungs: CTA bilaterally CV: Bradycardic and regular without murmur or gallop Abd: soft, NT, Positive BS, no hepatomegaly Ext: no C/C/E, distal pulses intact and equal Skin: warm/dry no rash  EKG:  Sinus bradycardia 53 beats per minute, otherwise within normal limits.  ASSESSMENT AND PLAN: 1. HTN - BP well controlled. He will continue on Lisinopril. Blood pressure is well controlled on lisinopril in the setting of his chronic kidney disease. No changes were made today.   2. Hyperlipidemia. On crestor 40 mg. recent lipid panel as outlined below: Lipid Panel     Component Value Date/Time   CHOL 118 03/12/2014 1157   TRIG 113.0 03/12/2014 1157   HDL 29.70* 03/12/2014 1157   CHOLHDL 4 03/12/2014 1157   VLDL 22.6 03/12/2014 1157   LDLCALC 66 03/12/2014 1157   I will see him back for follow-up in one year.  Legrand Como  Burt Knack MD 03/22/2014 10:16 AM

## 2014-03-25 MED ORDER — ZOLPIDEM TARTRATE 5 MG PO TABS: 5.0000 mg | ORAL_TABLET | Freq: Every evening | ORAL | Status: DC | PRN

## 2014-03-25 NOTE — Telephone Encounter (Signed)
i printed 

## 2014-03-25 NOTE — Telephone Encounter (Signed)
please call patient: i can print a refill, and put a future refill date on it.

## 2014-03-25 NOTE — Telephone Encounter (Signed)
Called pt. He states that he was able to claim his refill. Pt states he is going to be out of town for the next couple of months and wanted to call his medication in early. Pt advised that refill could not be sent now. But has it gets closer to January we can send the prescription in. Pt voiced understanding and stated he was feeling well and did not need anything further.

## 2014-03-25 NOTE — Progress Notes (Signed)
Agree with assessment and plans for EGD

## 2014-03-25 NOTE — Telephone Encounter (Signed)
Pt states that he would like for medication to be refilled and be post dated.

## 2014-03-26 NOTE — Telephone Encounter (Signed)
Rx faxed to pharmacy  

## 2014-03-28 ENCOUNTER — Encounter: Payer: Self-pay | Admitting: Internal Medicine

## 2014-03-28 ENCOUNTER — Ambulatory Visit (AMBULATORY_SURGERY_CENTER): Payer: Medicare Other | Admitting: Internal Medicine

## 2014-03-28 VITALS — BP 127/65 | HR 48 | Temp 97.1°F | Resp 20 | Ht 71.0 in | Wt 201.0 lb

## 2014-03-28 DIAGNOSIS — K2100 Gastro-esophageal reflux disease with esophagitis, without bleeding: Secondary | ICD-10-CM

## 2014-03-28 DIAGNOSIS — E119 Type 2 diabetes mellitus without complications: Secondary | ICD-10-CM | POA: Diagnosis not present

## 2014-03-28 DIAGNOSIS — K222 Esophageal obstruction: Secondary | ICD-10-CM

## 2014-03-28 DIAGNOSIS — R1013 Epigastric pain: Secondary | ICD-10-CM | POA: Diagnosis not present

## 2014-03-28 DIAGNOSIS — R109 Unspecified abdominal pain: Secondary | ICD-10-CM | POA: Diagnosis not present

## 2014-03-28 DIAGNOSIS — I10 Essential (primary) hypertension: Secondary | ICD-10-CM | POA: Diagnosis not present

## 2014-03-28 MED ORDER — SODIUM CHLORIDE 0.9 % IV SOLN: 500.0000 mL | INTRAVENOUS | Status: DC

## 2014-03-28 NOTE — Patient Instructions (Signed)

## 2014-03-28 NOTE — Op Note (Signed)
[place]  [address] [place], [id number]   ENDOSCOPY PROCEDURE REPORT  PATIENT: [name], [name]  MR#: [id number] BIRTHDATE: Jul 28, 1927 , 86  yrs. old GENDER: male ENDOSCOPIST: Eustace Quail, MD REFERRED BY:  Donavan Foil, M.D. PROCEDURE DATE:  03/28/2014 PROCEDURE:  EGD, diagnostic ASA CLASS:     Class II INDICATIONS:  epigastric abdominal pain. MEDICATIONS: Monitored anesthesia care and Propofol 150 mg IV TOPICAL ANESTHETIC: none  DESCRIPTION OF PROCEDURE: After the risks benefits and alternatives of the procedure were thoroughly explained, informed consent was obtained.  The LB [id number] endoscope was introduced through the mouth and advanced to the second portion of the duodenum , Without limitations.  The instrument was slowly withdrawn as the mucosa was fully examined.  EXAM:The esophagus revealed a ringlike distal stricture with mild inflammation.  Otherwise normal.  Normal stomach and duodenum. Retroflexed views revealed a hiatal hernia.     The scope was then withdrawn from the patient and the procedure completed.  COMPLICATIONS: There were no immediate complications.  ENDOSCOPIC IMPRESSION: 1. GERD with peptic stricture and mild esophagitis 2. Gallstones. Suspected etiology of prior abdominal pain  RECOMMENDATIONS: 1.  Continue Prevacid 30 mg daily 2.  My office will arrange for you to meet a general surgeon "abdominal pain, gallstones, consider laparoscopic cholecystectomy"   REPEAT EXAM:  eSigned:  Eustace Quail, MD 03/28/2014 5:06 PM    CL:[id number] Rupert Stacks, MD  CPT CODES: ICD CODES:

## 2014-03-28 NOTE — Progress Notes (Signed)
Patient awakening,vss,report to rn 

## 2014-03-29 ENCOUNTER — Telehealth: Payer: Self-pay | Admitting: *Deleted

## 2014-03-29 ENCOUNTER — Telehealth: Payer: Self-pay | Admitting: Internal Medicine

## 2014-03-29 ENCOUNTER — Telehealth: Payer: Self-pay | Admitting: Endocrinology

## 2014-03-29 ENCOUNTER — Telehealth: Payer: Self-pay

## 2014-03-29 DIAGNOSIS — R1011 Right upper quadrant pain: Secondary | ICD-10-CM

## 2014-03-29 MED ORDER — LANSOPRAZOLE 30 MG PO CPDR: 30.0000 mg | DELAYED_RELEASE_CAPSULE | Freq: Every day | ORAL | Status: DC

## 2014-03-29 NOTE — Telephone Encounter (Signed)
Per Dr. Henrene Pastor patient to see Dr. Lucia Gaskins or [place].  Patient is leaving for [place] on 10/16, he will return for Thanksgiving.  Earliest appt with either surgeon December 3 8:45.  Information given to the wife.

## 2014-03-29 NOTE — Telephone Encounter (Signed)
See below and please advise, Thanks!  

## 2014-03-29 NOTE — Telephone Encounter (Signed)
No answer, left message to call if questions or concerns. 

## 2014-03-29 NOTE — Addendum Note (Signed)
Addended by: Renato Shin on: 03/29/2014 05:14 PM   Modules accepted: Orders

## 2014-03-29 NOTE — Telephone Encounter (Signed)
Ok, i have sent a prescription to [facility]

## 2014-03-29 NOTE — Telephone Encounter (Signed)
Message copied by Marlon Pel on Fri Mar 29, 2014 10:35 AM ------      Message from: Irene Shipper      Created: Thu Mar 28, 2014  5:34 PM      Regarding: Need surgical evaluation for laparoscopic cholecystectomy       [name] patient. Good friend with Dr. Velora Heckler. If you have a surgical appointment regarding possible cholecystectomy ASAP. He had EGD earlier today. He is planning on leaving for [place] in 2 weeks. Thanks ------

## 2014-03-29 NOTE — Telephone Encounter (Signed)
See phone note from today.

## 2014-03-29 NOTE — Telephone Encounter (Signed)
The patient states that his gastro MD wants him to take prevacid on a regular basis please call in a rx to [place] [facility]

## 2014-03-29 NOTE — Telephone Encounter (Signed)
I have left a message with [facility]

## 2014-04-01 ENCOUNTER — Telehealth: Payer: Self-pay | Admitting: Internal Medicine

## 2014-04-01 NOTE — Telephone Encounter (Signed)
Spoke with pts wife and let her know [facility] is sending Dr. Lear Ng nurse a request to see if the pt can be worked in on 10/14 or 10/15. Let Ms. Erney know I will call her as soon as I hear back from [facility].

## 2014-04-02 ENCOUNTER — Telehealth: Payer: Self-pay | Admitting: Endocrinology

## 2014-04-02 MED ORDER — LANSOPRAZOLE 30 MG PO CPDR: 30.0000 mg | DELAYED_RELEASE_CAPSULE | Freq: Every day | ORAL | Status: DC

## 2014-04-02 NOTE — Telephone Encounter (Signed)
Patient would like his rx sent to [facility]  Also make sure this is sent to his [facility] address  lansoprazole (PREVACID) 30 MG capsule   Thank you

## 2014-04-02 NOTE — Telephone Encounter (Signed)
Pt scheduled to see Dr. Excell Seltzer 04/12/14. Pt to arrive there at 2:15pm for a 2:30pm appt. Pt aware of appt.

## 2014-04-02 NOTE — Telephone Encounter (Signed)
Rx sent to pharmacy per pt's request.  

## 2014-04-10 ENCOUNTER — Other Ambulatory Visit: Payer: Self-pay | Admitting: Dermatology

## 2014-04-10 DIAGNOSIS — L820 Inflamed seborrheic keratosis: Secondary | ICD-10-CM | POA: Diagnosis not present

## 2014-04-10 DIAGNOSIS — D225 Melanocytic nevi of trunk: Secondary | ICD-10-CM | POA: Diagnosis not present

## 2014-04-10 DIAGNOSIS — C44729 Squamous cell carcinoma of skin of left lower limb, including hip: Secondary | ICD-10-CM | POA: Diagnosis not present

## 2014-04-10 DIAGNOSIS — Z85828 Personal history of other malignant neoplasm of skin: Secondary | ICD-10-CM | POA: Diagnosis not present

## 2014-04-10 DIAGNOSIS — L570 Actinic keratosis: Secondary | ICD-10-CM | POA: Diagnosis not present

## 2014-04-10 DIAGNOSIS — D485 Neoplasm of uncertain behavior of skin: Secondary | ICD-10-CM | POA: Diagnosis not present

## 2014-04-10 DIAGNOSIS — L821 Other seborrheic keratosis: Secondary | ICD-10-CM | POA: Diagnosis not present

## 2014-04-12 DIAGNOSIS — K8010 Calculus of gallbladder with chronic cholecystitis without obstruction: Secondary | ICD-10-CM | POA: Diagnosis not present

## 2014-04-16 ENCOUNTER — Telehealth: Payer: Self-pay | Admitting: Internal Medicine

## 2014-04-16 NOTE — Telephone Encounter (Signed)
Received 2 pages from [facility], [place] sent to Dr. Henrene Pastor. 04/16/14/ss.

## 2014-04-17 ENCOUNTER — Telehealth: Payer: Self-pay

## 2014-04-30 DIAGNOSIS — M2041 Other hammer toe(s) (acquired), right foot: Secondary | ICD-10-CM | POA: Diagnosis not present

## 2014-04-30 DIAGNOSIS — B351 Tinea unguium: Secondary | ICD-10-CM | POA: Diagnosis not present

## 2014-04-30 NOTE — Telephone Encounter (Signed)
error 

## 2014-06-17 ENCOUNTER — Telehealth: Payer: Self-pay

## 2014-06-17 NOTE — Telephone Encounter (Signed)
Requested call back from pt to discuss flu shot.  

## 2014-06-18 ENCOUNTER — Telehealth: Payer: Self-pay | Admitting: Cardiovascular Disease

## 2014-06-18 NOTE — Telephone Encounter (Signed)
New Msg     Pt has been taking baby aspirin for years everyday and was bumping into things and bleeding.   Pt has stopped taking aspirin everyday for about a week and hasn't had any of these episodes.   Pt wants to know if it is ok to stop taking them or what should he do.    Please call at [phone]

## 2014-06-18 NOTE — Telephone Encounter (Signed)
Left message that Dr Burt Knack is out of the office at this time. I will forward this message to Dr Burt Knack for review.

## 2014-06-19 NOTE — Telephone Encounter (Signed)
I left a detailed message on identified voicemail that pt can stop ASA.

## 2014-06-19 NOTE — Telephone Encounter (Signed)
This is fine - he can stop taking ASA. He has no hx of CAD or vascular disease.

## 2014-07-30 ENCOUNTER — Other Ambulatory Visit: Payer: Self-pay | Admitting: Endocrinology

## 2014-08-06 NOTE — Telephone Encounter (Signed)
error 

## 2014-08-15 DIAGNOSIS — D3132 Benign neoplasm of left choroid: Secondary | ICD-10-CM | POA: Diagnosis not present

## 2014-08-15 DIAGNOSIS — H5702 Anisocoria: Secondary | ICD-10-CM | POA: Diagnosis not present

## 2014-08-15 DIAGNOSIS — H40013 Open angle with borderline findings, low risk, bilateral: Secondary | ICD-10-CM | POA: Diagnosis not present

## 2014-08-15 DIAGNOSIS — E119 Type 2 diabetes mellitus without complications: Secondary | ICD-10-CM | POA: Diagnosis not present

## 2014-08-15 DIAGNOSIS — Z961 Presence of intraocular lens: Secondary | ICD-10-CM | POA: Diagnosis not present

## 2014-08-15 LAB — HM DIABETES EYE EXAM

## 2014-08-27 DIAGNOSIS — R208 Other disturbances of skin sensation: Secondary | ICD-10-CM | POA: Diagnosis not present

## 2014-08-27 DIAGNOSIS — L821 Other seborrheic keratosis: Secondary | ICD-10-CM | POA: Diagnosis not present

## 2014-08-27 DIAGNOSIS — D485 Neoplasm of uncertain behavior of skin: Secondary | ICD-10-CM | POA: Diagnosis not present

## 2014-08-27 DIAGNOSIS — L818 Other specified disorders of pigmentation: Secondary | ICD-10-CM | POA: Diagnosis not present

## 2014-08-27 DIAGNOSIS — C44729 Squamous cell carcinoma of skin of left lower limb, including hip: Secondary | ICD-10-CM | POA: Diagnosis not present

## 2014-08-27 DIAGNOSIS — Z85828 Personal history of other malignant neoplasm of skin: Secondary | ICD-10-CM | POA: Diagnosis not present

## 2014-09-12 DIAGNOSIS — R1032 Left lower quadrant pain: Secondary | ICD-10-CM | POA: Diagnosis not present

## 2014-09-13 DIAGNOSIS — K5730 Diverticulosis of large intestine without perforation or abscess without bleeding: Secondary | ICD-10-CM | POA: Diagnosis not present

## 2014-09-25 DIAGNOSIS — R1032 Left lower quadrant pain: Secondary | ICD-10-CM | POA: Diagnosis not present

## 2014-09-26 DIAGNOSIS — B351 Tinea unguium: Secondary | ICD-10-CM | POA: Diagnosis not present

## 2014-10-01 DIAGNOSIS — Z85828 Personal history of other malignant neoplasm of skin: Secondary | ICD-10-CM | POA: Diagnosis not present

## 2014-10-01 DIAGNOSIS — C44729 Squamous cell carcinoma of skin of left lower limb, including hip: Secondary | ICD-10-CM | POA: Diagnosis not present

## 2014-10-01 DIAGNOSIS — L821 Other seborrheic keratosis: Secondary | ICD-10-CM | POA: Diagnosis not present

## 2014-10-07 ENCOUNTER — Other Ambulatory Visit: Payer: Self-pay | Admitting: Endocrinology

## 2014-10-21 ENCOUNTER — Telehealth: Payer: Self-pay | Admitting: Internal Medicine

## 2014-10-21 NOTE — Telephone Encounter (Signed)
Left message for pt to call back  °

## 2014-10-22 NOTE — Telephone Encounter (Signed)
Pt is in [place]. States in March he was having LLQ abdominal pain had he was diagnosed with diverticulitis. Pt states he has changed his diet and it doing fairly well but continues to have about 3-4 diarrhea stools/day and a lot of gas. Pt states the GI doc in [place] told him he should have a colon done. Pt is reluctant to do this in [place]. Requests to be seen when he is back from [place]. Pt scheduled to see Dr. Henrene Pastor 11/18/14@9 :15am. Pt aware of appt.

## 2014-11-18 ENCOUNTER — Encounter: Payer: Self-pay | Admitting: Internal Medicine

## 2014-11-18 ENCOUNTER — Ambulatory Visit (INDEPENDENT_AMBULATORY_CARE_PROVIDER_SITE_OTHER): Payer: Medicare Other | Admitting: Internal Medicine

## 2014-11-18 VITALS — BP 126/68 | HR 60 | Ht 72.0 in | Wt 192.0 lb

## 2014-11-18 DIAGNOSIS — K8020 Calculus of gallbladder without cholecystitis without obstruction: Secondary | ICD-10-CM

## 2014-11-18 DIAGNOSIS — R197 Diarrhea, unspecified: Secondary | ICD-10-CM | POA: Diagnosis not present

## 2014-11-18 DIAGNOSIS — R194 Change in bowel habit: Secondary | ICD-10-CM | POA: Diagnosis not present

## 2014-11-18 DIAGNOSIS — K2100 Gastro-esophageal reflux disease with esophagitis, without bleeding: Secondary | ICD-10-CM

## 2014-11-18 DIAGNOSIS — K222 Esophageal obstruction: Secondary | ICD-10-CM

## 2014-11-18 MED ORDER — METRONIDAZOLE 250 MG PO TABS: 250.0000 mg | ORAL_TABLET | Freq: Three times a day (TID) | ORAL | Status: DC

## 2014-11-18 NOTE — Patient Instructions (Signed)
We have sent the following medications to your pharmacy for you to pick up at your convenience: Metronidazole Your follow up is with Dr Henrene Pastor on 12/20/14 at 9:15 am. CC:  Renato Shin MD

## 2014-11-18 NOTE — Progress Notes (Signed)
HISTORY OF PRESENT ILLNESS:  [name] is a 81 y.o. male with past medical history as listed below who has been seen in this office for GERD complicated by peptic stricture with esophagitis, symptomatic gallstones for which she saw a general surgeon but did not have recommended elective cholecystectomy, and adenomatous colon polyps. Last colonoscopy October 2009 with diverticulosis only. The patient presents today with his wife. They recently returned from [place]. Chief complaint today is 2 month history of loose stools and increased gas. Previously one formed bowel movement per day. Currently 2-3 somewhat loose movements with occasional explosive gas. May however have no bowel movements some days. No bleeding. No nocturnal component. No new medications or exposure to antibiotics prior. He was having some left lower quadrant abdominal discomfort for which she saw a gastroenterologist in [place]. CT scan of the abdomen and pelvis was performed 09/13/2014. I have reviewed that report. Impression is postop prostatectomy and diverticulosis of the sigmoid colon. They have questions regarding diverticulosis. Patient has had approximately 5 pound weight loss which his wife attributes to their new diet. Currently without abdominal pain. Overall he reports the trend with his bowel movements has been poor improvement though not at baseline. He does report the Gas-X helps his gas.  REVIEW OF SYSTEMS:  All non-GI ROS negative except for depression, transient right hip pain  Past Medical History  Diagnosis Date  . PROSTATE CANCER 11/07/2007  . THYROID NODULE, RIGHT 11/13/2008  . DIABETES MELLITUS, TYPE II 02/04/2007  . HYPERLIPIDEMIA 12/04/2007  . UNSPECIFIED ANEMIA 11/07/2007  . THROMBOCYTOPENIA 04/17/2008  . DEPRESSION 02/04/2007  . OBSTRUCTIVE SLEEP APNEA 11/05/2009    -PSG 01/05/10 RDI 11, PLMI 56  . GLAUCOMA 03/08/2009  . HYPERTENSION 02/04/2007  . BRADYCARDIA 11/13/2008  . DIVERTICULOSIS, COLON 02/04/2007  .  RENAL INSUFFICIENCY 11/13/2008  . OSTEOARTHRITIS 02/04/2007  . COLONIC POLYPS, HX OF 02/04/2007  . FOREIGN BODY, ASPIRATION 12/04/2007  . SYNCOPE 12/31/2009  . Cholelithiasis     Past Surgical History  Procedure Laterality Date  . Prostatectomy  1997  . Heart cartherization  2000  . Tonsillectomy  1940's    Social History [facility]  reports that he quit smoking about 21 years ago. His smoking use included Cigarettes and Pipe. He has never used smokeless tobacco. He reports that he drinks about 0.6 oz of alcohol per week. He reports that he does not use illicit drugs.  family history includes Kidney disease in his father. There is no history of Heart disease or Cancer.  Allergies  Allergen Reactions  . Lactose Intolerance (Gi)   . Sulfonamide Derivatives     REACTION: Itch/Red Spots       PHYSICAL EXAMINATION: Vital signs: BP 126/68 mmHg  Pulse 60  Ht 6' (1.829 m)  Wt 192 lb (87.091 kg)  BMI 26.03 kg/m2 General: Well-developed, well-nourished, no acute distress HEENT: Sclerae are anicteric, conjunctiva pink. Oral mucosa intact Lungs: Clear Heart: Regular Abdomen: soft, nontender, nondistended, no obvious ascites, no peritoneal signs, normal bowel sounds. No organomegaly. Extremities: No edema Psychiatric: alert and oriented x3. Cooperative   ASSESSMENT:  #1. Change in bowel habits as manifested by loose stools with increased gas. Rule out infection. Rule out bacterial overgrowth and diabetic. Rule out medication related #2. GERD, complicated by peptic stricture. Asymptomatic on PPI #3. Colonic diverticulosis without diverticulitis. #4. Gen. medical problems. Stable #5. Cholelithiasis. Cholecystectomy previously recommended. The patient wishes to hold off, as he "is having no problems"   PLAN:  #1.  Empiric trial of metronidazole 250 mg 3 times a day 10 days. Advised to avoid alcohol while on this medication #2. Continue reflux precautions and PPI #3. Discussion  today on clinical diverticulosis and diverticulitis. No dietary restrictions recommended #4. Recommend cholecystectomy for any recurrent problems with upper abdominal pain as previous #5. GI office follow-up in 4 weeks  25 minutes spent face-to-face with this patient. Greater than 50% of the time was used to counsel him regarding his above medical problems as well as answer he and his wife's questions

## 2014-11-19 ENCOUNTER — Other Ambulatory Visit: Payer: Self-pay

## 2014-11-20 ENCOUNTER — Telehealth: Payer: Self-pay

## 2014-11-20 MED ORDER — OMEPRAZOLE 40 MG PO CPDR
40.0000 mg | DELAYED_RELEASE_CAPSULE | Freq: Every day | ORAL | Status: DC
Start: 2014-11-20 — End: 2014-11-20

## 2014-11-20 MED ORDER — OMEPRAZOLE 40 MG PO CPDR
40.0000 mg | DELAYED_RELEASE_CAPSULE | Freq: Every day | ORAL | Status: DC
Start: 2014-11-20 — End: 2015-03-14

## 2014-11-20 NOTE — Telephone Encounter (Signed)
I contacted the patient. Patient agreed to changing Lansoprazole 30 mg to Omeprazole.

## 2014-11-20 NOTE — Telephone Encounter (Signed)
i have sent a prescription to your pharmacy 

## 2014-11-20 NOTE — Telephone Encounter (Signed)
Patient notified

## 2014-12-09 ENCOUNTER — Other Ambulatory Visit: Payer: Self-pay | Admitting: Endocrinology

## 2014-12-20 ENCOUNTER — Ambulatory Visit: Payer: BLUE CROSS/BLUE SHIELD | Admitting: Internal Medicine

## 2014-12-24 ENCOUNTER — Ambulatory Visit (INDEPENDENT_AMBULATORY_CARE_PROVIDER_SITE_OTHER): Payer: Medicare Other | Admitting: Internal Medicine

## 2014-12-24 ENCOUNTER — Encounter: Payer: Self-pay | Admitting: Internal Medicine

## 2014-12-24 VITALS — BP 100/64 | HR 68 | Ht 70.5 in | Wt 192.2 lb

## 2014-12-24 DIAGNOSIS — K529 Noninfective gastroenteritis and colitis, unspecified: Secondary | ICD-10-CM | POA: Diagnosis not present

## 2014-12-24 DIAGNOSIS — K8020 Calculus of gallbladder without cholecystitis without obstruction: Secondary | ICD-10-CM | POA: Diagnosis not present

## 2014-12-24 DIAGNOSIS — K2100 Gastro-esophageal reflux disease with esophagitis, without bleeding: Secondary | ICD-10-CM

## 2014-12-24 DIAGNOSIS — K222 Esophageal obstruction: Secondary | ICD-10-CM

## 2014-12-24 NOTE — Patient Instructions (Signed)
Follow up as needed with Dr Henrene Pastor

## 2014-12-24 NOTE — Progress Notes (Signed)
HISTORY OF PRESENT ILLNESS:  [name] is a 81 y.o. male with past medical history as listed below who has been seen in this office for GERD, complicated by peptic stricture and esophagitis, symptomatic gallstones for which she saw Dr. Excell Seltzer last year but has not had recommended elective cholecystectomy, and adenomatous colon polyps. The patient was last evaluated 11/18/2014 regarding change in bowel habits as manifested by loose stools and increased intestinal gas. See that dictation for details. He was empirically treated with metronidazole 250 mg 3 times a day 10 days. Follow-up at this time arranged. He is accompanied by his wife. He is delighted to report that his symptoms have completely resolved. They wish to discuss possible reasons for his improvement. Next, have questions regarding his history of gallstones. Fortunately, he has had no further symptoms since last year. Specifically, no problems with nausea, abdominal pain, or jaundice  REVIEW OF SYSTEMS:  All non-GI ROS negative except for anxiety, depression, confusion, hearing problems, muscle cramps, night sweats, sleeping problems  Past Medical History  Diagnosis Date  . PROSTATE CANCER 11/07/2007  . THYROID NODULE, RIGHT 11/13/2008  . DIABETES MELLITUS, TYPE II 02/04/2007  . HYPERLIPIDEMIA 12/04/2007  . UNSPECIFIED ANEMIA 11/07/2007  . THROMBOCYTOPENIA 04/17/2008  . DEPRESSION 02/04/2007  . OBSTRUCTIVE SLEEP APNEA 11/05/2009    -PSG 01/05/10 RDI 11, PLMI 56  . GLAUCOMA 03/08/2009  . HYPERTENSION 02/04/2007  . BRADYCARDIA 11/13/2008  . DIVERTICULOSIS, COLON 02/04/2007  . RENAL INSUFFICIENCY 11/13/2008  . OSTEOARTHRITIS 02/04/2007  . COLONIC POLYPS, HX OF 02/04/2007  . FOREIGN BODY, ASPIRATION 12/04/2007  . SYNCOPE 12/31/2009  . Cholelithiasis   . GERD (gastroesophageal reflux disease)   . Peptic stricture of esophagus     Past Surgical History  Procedure Laterality Date  . Prostatectomy  1997  . Heart cartherization  2000  .  Tonsillectomy  1940's    Social History [name]  reports that he quit smoking about 21 years ago. His smoking use included Cigarettes and Pipe. He has never used smokeless tobacco. He reports that he drinks about 0.6 oz of alcohol per week. He reports that he does not use illicit drugs.  family history includes Kidney disease in his father. There is no history of Heart disease or Cancer.  Allergies  Allergen Reactions  . Lactose Intolerance (Gi)   . Sulfonamide Derivatives     REACTION: Itch/Red Spots       PHYSICAL EXAMINATION: Vital signs: BP 100/64 mmHg  Pulse 68  Ht 5' 10.5" (1.791 m)  Wt 192 lb 4 oz (87.204 kg)  BMI 27.19 kg/m2 General: Well-developed, well-nourished, no acute distress HEENT: Sclerae are anicteric, conjunctiva pink. Oral mucosa intact Lungs: Clear Heart: Regular Abdomen: soft, nontender, nondistended, no obvious ascites, no peritoneal signs, normal bowel sounds. No organomegaly. Extremities: No edema Psychiatric: alert and oriented x3. Cooperative   ASSESSMENT:  #1. Change in bowel habits and increased gas. Suspect gastritis versus mild bacterial overgrowth. Resolved after course of metronidazole #2. Cholelithiasis. Previously with symptoms. Currently asymptomatic. I discussed with them the possibility of recurrent symptomatic gallstones such as pain with or without cholecystitis, symptomatic choledocholithiasis with jaundice and cholangitis, and gallstone pancreatitis. I gave them a copy of the surgical consultation note with recommendations #3. GERD, complicated by peptic stricture. Early asymptomatic on PPI #4. History of adenomatous colon polyps. Last colonoscopy 2009 with diverticulosis only. Aged out of surveillance  PLAN:  #1. Continue PPI #2. Contact the surgeon for recurrent symptomatic cholelithiasis (potential symptoms reviewed)  as they are not interested elective surgery currently #3. GI follow-up as needed  25 minutes was spent  face-to-face with the patient. Greater than 50% of the time was spent answering his and his wife questions and counseling him regarding the above GI issues.

## 2014-12-31 ENCOUNTER — Telehealth: Payer: Self-pay | Admitting: Endocrinology

## 2014-12-31 NOTE — Telephone Encounter (Signed)
Pt's wife calling requesting we rx the pt an anxiety meds so he can calm down about her going in for surgery.

## 2014-12-31 NOTE — Telephone Encounter (Signed)
Pt's wife advised of note below. Pt's appointment scheduled for 01/02/15 at 130 pm.

## 2014-12-31 NOTE — Telephone Encounter (Signed)
See note below and please advise, Thanks! 

## 2014-12-31 NOTE — Telephone Encounter (Signed)
Please move up next appt to this week.

## 2015-01-02 ENCOUNTER — Ambulatory Visit (INDEPENDENT_AMBULATORY_CARE_PROVIDER_SITE_OTHER): Payer: Medicare Other | Admitting: Endocrinology

## 2015-01-02 ENCOUNTER — Encounter: Payer: Self-pay | Admitting: Endocrinology

## 2015-01-02 VITALS — BP 120/62 | HR 58 | Temp 97.7°F | Resp 16 | Ht 71.0 in | Wt 193.0 lb

## 2015-01-02 DIAGNOSIS — E119 Type 2 diabetes mellitus without complications: Secondary | ICD-10-CM | POA: Diagnosis not present

## 2015-01-02 DIAGNOSIS — N259 Disorder resulting from impaired renal tubular function, unspecified: Secondary | ICD-10-CM | POA: Diagnosis not present

## 2015-01-02 DIAGNOSIS — E1122 Type 2 diabetes mellitus with diabetic chronic kidney disease: Secondary | ICD-10-CM | POA: Diagnosis not present

## 2015-01-02 DIAGNOSIS — D696 Thrombocytopenia, unspecified: Secondary | ICD-10-CM | POA: Diagnosis not present

## 2015-01-02 DIAGNOSIS — N189 Chronic kidney disease, unspecified: Secondary | ICD-10-CM

## 2015-01-02 LAB — CBC WITH DIFFERENTIAL/PLATELET
BASOS ABS: 0 10*3/uL (ref 0.0–0.1)
Basophils Relative: 0.1 % (ref 0.0–3.0)
EOS PCT: 3.5 % (ref 0.0–5.0)
Eosinophils Absolute: 0.3 10*3/uL (ref 0.0–0.7)
HCT: 36.5 % — ABNORMAL LOW (ref 39.0–52.0)
Hemoglobin: 12.2 g/dL — ABNORMAL LOW (ref 13.0–17.0)
LYMPHS PCT: 17.4 % (ref 12.0–46.0)
Lymphs Abs: 1.6 10*3/uL (ref 0.7–4.0)
MCHC: 33.5 g/dL (ref 30.0–36.0)
MCV: 95.4 fl (ref 78.0–100.0)
MONOS PCT: 11.2 % (ref 3.0–12.0)
Monocytes Absolute: 1 10*3/uL (ref 0.1–1.0)
NEUTROS ABS: 6.3 10*3/uL (ref 1.4–7.7)
Neutrophils Relative %: 67.8 % (ref 43.0–77.0)
Platelets: 117 10*3/uL — ABNORMAL LOW (ref 150.0–400.0)
RBC: 3.82 Mil/uL — ABNORMAL LOW (ref 4.22–5.81)
RDW: 14.1 % (ref 11.5–15.5)
WBC: 9.3 10*3/uL (ref 4.0–10.5)

## 2015-01-02 LAB — BASIC METABOLIC PANEL
BUN: 43 mg/dL — ABNORMAL HIGH (ref 6–23)
CALCIUM: 9 mg/dL (ref 8.4–10.5)
CHLORIDE: 109 meq/L (ref 96–112)
CO2: 22 mEq/L (ref 19–32)
Creatinine, Ser: 1.69 mg/dL — ABNORMAL HIGH (ref 0.40–1.50)
GFR: 41.17 mL/min — ABNORMAL LOW (ref >60.00)
GLUCOSE: 141 mg/dL — AB (ref 70–99)
POTASSIUM: 5.2 meq/L — AB (ref 3.5–5.1)
SODIUM: 139 meq/L (ref 135–145)

## 2015-01-02 LAB — HEMOGLOBIN A1C: Hgb A1c MFr Bld: 5.6 % (ref 4.6–6.5)

## 2015-01-02 LAB — LIPID PANEL
CHOL/HDL RATIO: 3
CHOLESTEROL: 105 mg/dL (ref 0–200)
HDL: 31.6 mg/dL — ABNORMAL LOW (ref >39.00)
LDL Cholesterol: 50 mg/dL (ref 0–99)
NONHDL: 73.4
Triglycerides: 119 mg/dL (ref 0.0–149.0)
VLDL: 23.8 mg/dL (ref 0.0–40.0)

## 2015-01-02 LAB — TSH: TSH: 3.67 u[IU]/mL (ref 0.35–4.50)

## 2015-01-02 MED ORDER — TRAZODONE HCL 100 MG PO TABS: 100.0000 mg | ORAL_TABLET | Freq: Every day | ORAL | Status: DC

## 2015-01-02 NOTE — Patient Instructions (Signed)
blood tests are requested for you today.  We'll let you know about the results.  i have sent a prescription to your pharmacy, for the sleep.  It helps the other symptoms as well.   Please come back for a follow-up appointment in 3-4 months.

## 2015-01-02 NOTE — Progress Notes (Signed)
Subjective:    Patient ID: [name], male    DOB: [birth year], 81 y.o.   MRN: [id number]  HPI  The state of at least three ongoing medical problems is addressed today, with interval history of each noted here: Renal failure: he denies chest pain.   Depression: pt says this persists.  Pt says he has never been on rx for this before.   Thrombocytopenia: he denies brbpr and hematuria.   Past Medical History  Diagnosis Date  . PROSTATE CANCER 11/07/2007  . THYROID NODULE, RIGHT 11/13/2008  . DIABETES MELLITUS, TYPE II 02/04/2007  . HYPERLIPIDEMIA 12/04/2007  . UNSPECIFIED ANEMIA 11/07/2007  . THROMBOCYTOPENIA 04/17/2008  . DEPRESSION 02/04/2007  . OBSTRUCTIVE SLEEP APNEA 11/05/2009    -PSG 01/05/10 RDI 11, PLMI 56  . GLAUCOMA 03/08/2009  . HYPERTENSION 02/04/2007  . BRADYCARDIA 11/13/2008  . DIVERTICULOSIS, COLON 02/04/2007  . RENAL INSUFFICIENCY 11/13/2008  . OSTEOARTHRITIS 02/04/2007  . COLONIC POLYPS, HX OF 02/04/2007  . FOREIGN BODY, ASPIRATION 12/04/2007  . SYNCOPE 12/31/2009  . Cholelithiasis   . GERD (gastroesophageal reflux disease)   . Peptic stricture of esophagus     Past Surgical History  Procedure Laterality Date  . Prostatectomy  1997  . Heart cartherization  2000  . Tonsillectomy  1940's    History   Social History  . Marital Status: Married    Spouse Name: N/A  . Number of Children: 3  . Years of Education: N/A   Occupational History  . retired     Retired   Social History Main Topics  . Smoking status: Former Smoker    Types: Cigarettes, Pipe    Quit date: 07/08/1993  . Smokeless tobacco: Never Used  . Alcohol Use: 0.6 oz/week    1 Shots of liquor per week     Comment: 1 shot a day  . Drug Use: No  . Sexual Activity: Not on file   Other Topics Concern  . Not on file   Social History Narrative   He plays tennis 3x a week    Current Outpatient Prescriptions on File Prior to Visit  Medication Sig Dispense Refill  . aspirin 81 MG tablet Take 81 mg by  mouth daily.    . Cholecalciferol (VITAMIN D) 2000 UNITS CAPS Take 1 capsule by mouth daily.      . CRESTOR 40 MG tablet TAKE 1 TABLET EVERY DAY 90 tablet 1  . lisinopril (PRINIVIL,ZESTRIL) 20 MG tablet TAKE 1 TABLET (20 MG TOTAL) DAILY 90 tablet 3  . omeprazole (PRILOSEC) 40 MG capsule Take 1 capsule (40 mg total) by mouth daily. 90 capsule 1   No current facility-administered medications on file prior to visit.    Allergies  Allergen Reactions  . Lactose Intolerance (Gi)   . Sulfonamide Derivatives     REACTION: Itch/Red Spots    Family History  Problem Relation Age of Onset  . Kidney disease Father   . Heart disease Neg Hx     No FH of Coronary Artery Disease  . Cancer Neg Hx     BP 120/62 mmHg  Pulse 58  Temp(Src) 97.7 F (36.5 C) (Oral)  Resp 16  Ht 5\' 11"  (1.803 m)  Wt 193 lb (87.544 kg)  BMI 26.93 kg/m2  SpO2 96%   Review of Systems Denies falls and memory loss, but he has insomnia.      Objective:   Physical Exam VITAL SIGNS:  See vs page.   GENERAL: no distress.  Pulses: dorsalis pedis intact bilat.   MSK: no deformity of the feet.  CV: no leg edema.  Skin:  no ulcer on the feet.  normal color and temp on the feet. Neuro: sensation is intact to touch on the feet.  PSYCH: Alert and well-oriented.  Does not appear anxious nor depressed.  Lab Results  Component Value Date   WBC 9.3 01/02/2015   HGB 12.2* 01/02/2015   HCT 36.5* 01/02/2015   MCV 95.4 01/02/2015   PLT 117.0* 01/02/2015   Lab Results  Component Value Date   CREATININE 1.69* 01/02/2015   BUN 43* 01/02/2015   NA 139 01/02/2015   K 5.2* 01/02/2015   CL 109 01/02/2015   CO2 22 01/02/2015       Assessment & Plan:  Renal failure, slightly better.  We'll follow Thrombocytopenia, slightly better.  We'll follow Hyperkalemia: slightly worse Depression/anxiety: the insomnia component is slightly worse.  Patient is advised the following: Patient Instructions  blood tests are requested  for you today.  We'll let you know about the results.  i have sent a prescription to your pharmacy, for the sleep.  It helps the other symptoms as well.   Please come back for a follow-up appointment in 3-4 months.    addendum: i have sent a prescription to your pharmacy, to reduce zestril

## 2015-01-04 ENCOUNTER — Telehealth: Payer: Self-pay | Admitting: Endocrinology

## 2015-01-04 MED ORDER — LISINOPRIL 10 MG PO TABS: 10.0000 mg | ORAL_TABLET | Freq: Every day | ORAL | Status: DC

## 2015-01-04 NOTE — Telephone Encounter (Signed)
please call patient: Going over your test results, your potassium is slightly high Please reduce the lisinopril to 10 mg qd.  i have sent a prescription to your pharmacy.

## 2015-01-06 ENCOUNTER — Other Ambulatory Visit: Payer: Self-pay | Admitting: Endocrinology

## 2015-01-06 MED ORDER — LISINOPRIL 5 MG PO TABS: 5.0000 mg | ORAL_TABLET | Freq: Every day | ORAL | Status: DC

## 2015-01-06 NOTE — Telephone Encounter (Signed)
Pt is currently taking 10mg  of the Lisinopril now would you like for him to reduce this amount or to keep taking the 10mg ?

## 2015-01-06 NOTE — Telephone Encounter (Signed)
Please reduce to 5 mg.  i have sent a prescription to your pharmacy

## 2015-01-06 NOTE — Telephone Encounter (Signed)
Pt understands that he is to go to the Pharmacy and pick up new 5 mg Rx.

## 2015-01-13 ENCOUNTER — Telehealth: Payer: Self-pay | Admitting: Endocrinology

## 2015-01-13 NOTE — Telephone Encounter (Signed)
ok 

## 2015-01-13 NOTE — Telephone Encounter (Signed)
Pt advised MD is ok with pill be cut in half.

## 2015-01-13 NOTE — Telephone Encounter (Signed)
Trazodone is making him walk wobbly-he is going to cut the pill in half is this ok

## 2015-02-03 ENCOUNTER — Other Ambulatory Visit: Payer: Self-pay | Admitting: Endocrinology

## 2015-03-10 ENCOUNTER — Other Ambulatory Visit: Payer: Medicare Other

## 2015-03-10 ENCOUNTER — Telehealth: Payer: Self-pay

## 2015-03-10 ENCOUNTER — Other Ambulatory Visit (INDEPENDENT_AMBULATORY_CARE_PROVIDER_SITE_OTHER): Payer: Medicare Other

## 2015-03-10 DIAGNOSIS — I10 Essential (primary) hypertension: Secondary | ICD-10-CM

## 2015-03-10 DIAGNOSIS — D696 Thrombocytopenia, unspecified: Secondary | ICD-10-CM

## 2015-03-10 DIAGNOSIS — E785 Hyperlipidemia, unspecified: Secondary | ICD-10-CM | POA: Diagnosis not present

## 2015-03-10 DIAGNOSIS — N289 Disorder of kidney and ureter, unspecified: Secondary | ICD-10-CM | POA: Insufficient documentation

## 2015-03-10 DIAGNOSIS — E1122 Type 2 diabetes mellitus with diabetic chronic kidney disease: Secondary | ICD-10-CM

## 2015-03-10 DIAGNOSIS — N189 Chronic kidney disease, unspecified: Secondary | ICD-10-CM

## 2015-03-10 LAB — LIPID PANEL
CHOL/HDL RATIO: 3
Cholesterol: 111 mg/dL (ref 0–200)
HDL: 34.5 mg/dL — ABNORMAL LOW (ref >39.00)
LDL CALC: 58 mg/dL (ref 0–99)
NONHDL: 76.1
TRIGLYCERIDES: 90 mg/dL (ref 0.0–149.0)
VLDL: 18 mg/dL (ref 0.0–40.0)

## 2015-03-10 LAB — HEPATIC FUNCTION PANEL
ALBUMIN: 4.1 g/dL (ref 3.5–5.2)
ALK PHOS: 72 U/L (ref 39–117)
ALT: 23 U/L (ref 0–53)
AST: 27 U/L (ref 0–37)
Bilirubin, Direct: 0.1 mg/dL (ref 0.0–0.3)
TOTAL PROTEIN: 6.9 g/dL (ref 6.0–8.3)
Total Bilirubin: 0.7 mg/dL (ref 0.2–1.2)

## 2015-03-10 LAB — TSH: TSH: 5.89 u[IU]/mL — AB (ref 0.35–4.50)

## 2015-03-10 LAB — BASIC METABOLIC PANEL
BUN: 41 mg/dL — AB (ref 6–23)
CHLORIDE: 108 meq/L (ref 96–112)
CO2: 26 mEq/L (ref 19–32)
Calcium: 9.1 mg/dL (ref 8.4–10.5)
Creatinine, Ser: 1.68 mg/dL — ABNORMAL HIGH (ref 0.40–1.50)
GFR: 41.43 mL/min — AB (ref >60.00)
Glucose, Bld: 83 mg/dL (ref 70–99)
POTASSIUM: 4.4 meq/L (ref 3.5–5.1)
SODIUM: 142 meq/L (ref 135–145)

## 2015-03-10 LAB — IBC PANEL
IRON: 93 ug/dL (ref 42–165)
SATURATION RATIOS: 28.5 % (ref 20.0–50.0)
TRANSFERRIN: 233 mg/dL (ref 212.0–360.0)

## 2015-03-10 LAB — URIC ACID: URIC ACID, SERUM: 7.1 mg/dL (ref 4.0–7.8)

## 2015-03-10 NOTE — Telephone Encounter (Signed)
Pt called stating [name] lab needed blood work orders. Please advise what orders should be placed.

## 2015-03-10 NOTE — Telephone Encounter (Signed)
Left voicemail advising the pt his orders have been placed.

## 2015-03-10 NOTE — Telephone Encounter (Signed)
i ordered

## 2015-03-14 ENCOUNTER — Encounter: Payer: Self-pay | Admitting: Endocrinology

## 2015-03-14 ENCOUNTER — Encounter: Payer: BLUE CROSS/BLUE SHIELD | Admitting: Endocrinology

## 2015-03-14 ENCOUNTER — Ambulatory Visit (INDEPENDENT_AMBULATORY_CARE_PROVIDER_SITE_OTHER): Payer: Medicare Other | Admitting: Endocrinology

## 2015-03-14 VITALS — BP 137/76 | HR 56 | Temp 98.6°F | Ht 72.0 in | Wt 193.0 lb

## 2015-03-14 DIAGNOSIS — N189 Chronic kidney disease, unspecified: Secondary | ICD-10-CM | POA: Diagnosis not present

## 2015-03-14 DIAGNOSIS — Z23 Encounter for immunization: Secondary | ICD-10-CM

## 2015-03-14 DIAGNOSIS — E1122 Type 2 diabetes mellitus with diabetic chronic kidney disease: Secondary | ICD-10-CM | POA: Diagnosis not present

## 2015-03-14 DIAGNOSIS — Z0000 Encounter for general adult medical examination without abnormal findings: Secondary | ICD-10-CM | POA: Diagnosis not present

## 2015-03-14 NOTE — Progress Notes (Signed)
we discussed code status.  pt requests full code, but would not want to be started or maintained on artificial life-support measures if there was not a reasonable chance of recovery 

## 2015-03-14 NOTE — Progress Notes (Signed)
Subjective:    Patient ID: [name], male    DOB: Jul 25, 1927, 81 y.o.   MRN: [id number]  HPI The state of at least three ongoing medical problems is addressed today, with interval history of each noted here: multinodular goiter: pt says he does not notice.   Dyslipidemia: he denies chest pain.   Renal insufficiency: he denies sob.  Past Medical History  Diagnosis Date  . PROSTATE CANCER 11/07/2007  . THYROID NODULE, RIGHT 11/13/2008  . DIABETES MELLITUS, TYPE II 02/04/2007  . HYPERLIPIDEMIA 12/04/2007  . UNSPECIFIED ANEMIA 11/07/2007  . THROMBOCYTOPENIA 04/17/2008  . DEPRESSION 02/04/2007  . OBSTRUCTIVE SLEEP APNEA 11/05/2009    -PSG 01/05/10 RDI 11, PLMI 56  . GLAUCOMA 03/08/2009  . HYPERTENSION 02/04/2007  . BRADYCARDIA 11/13/2008  . DIVERTICULOSIS, COLON 02/04/2007  . RENAL INSUFFICIENCY 11/13/2008  . OSTEOARTHRITIS 02/04/2007  . COLONIC POLYPS, HX OF 02/04/2007  . FOREIGN BODY, ASPIRATION 12/04/2007  . SYNCOPE 12/31/2009  . Cholelithiasis   . GERD (gastroesophageal reflux disease)   . Peptic stricture of esophagus     Past Surgical History  Procedure Laterality Date  . Prostatectomy  1997  . Heart cartherization  2000  . Tonsillectomy  1940's    Social History   Social History  . Marital Status: Married    Spouse Name: N/A  . Number of Children: 3  . Years of Education: N/A   Occupational History  . retired     Retired   Social History Main Topics  . Smoking status: Former Smoker    Types: Cigarettes, Pipe    Quit date: 07/08/1993  . Smokeless tobacco: Never Used  . Alcohol Use: 0.6 oz/week    1 Shots of liquor per week     Comment: 1 shot a day  . Drug Use: No  . Sexual Activity: Not on file   Other Topics Concern  . Not on file   Social History Narrative   He plays tennis 3x a week    Current Outpatient Prescriptions on File Prior to Visit  Medication Sig Dispense Refill  . aspirin 81 MG tablet Take 81 mg by mouth daily.    . Cholecalciferol (VITAMIN D)  2000 UNITS CAPS Take 1 capsule by mouth daily.      Marland Kitchen lisinopril (PRINIVIL,ZESTRIL) 5 MG tablet Take 1 tablet (5 mg total) by mouth daily. 90 tablet 3  . rosuvastatin (CRESTOR) 40 MG tablet TAKE 1 TABLET EVERY DAY 90 tablet 1  . traZODone (DESYREL) 100 MG tablet Take 1 tablet (100 mg total) by mouth at bedtime. (Patient not taking: Reported on 03/14/2015) 30 tablet 11   No current facility-administered medications on file prior to visit.    Allergies  Allergen Reactions  . Lactose Intolerance (Gi)   . Sulfonamide Derivatives     REACTION: Itch/Red Spots    Family History  Problem Relation Age of Onset  . Kidney disease Father   . Heart disease Neg Hx     No FH of Coronary Artery Disease  . Cancer Neg Hx     BP 137/76 mmHg  Pulse 56  Temp(Src) 98.6 F (37 C) (Oral)  Ht 6' (1.829 m)  Wt 193 lb (87.544 kg)  BMI 26.17 kg/m2  SpO2 97%    Review of Systems Denies weight change and edema    Objective:   Physical Exam VITAL SIGNS:  See vs page GENERAL: no distress NECK: There is no palpable thyroid enlargement.  No thyroid nodule is palpable.  No palpable lymphadenopathy at the anterior neck. Pulses: dorsalis pedis intact bilat.   MSK: no deformity of the feet CV: trace bilat leg edema Skin:  no ulcer on the feet.  normal color and temp on the feet. Neuro: sensation is intact to touch on the feet     Lab Results  Component Value Date   HGBA1C 5.6 01/02/2015   Lab Results  Component Value Date   TSH 5.89* 03/10/2015   Lab Results  Component Value Date   CREATININE 1.68* 03/10/2015   BUN 41* 03/10/2015   NA 142 03/10/2015   K 4.4 03/10/2015   CL 108 03/10/2015   CO2 26 03/10/2015   Lab Results  Component Value Date   CHOL 111 03/10/2015   HDL 34.50* 03/10/2015   LDLCALC 58 03/10/2015   TRIG 90.0 03/10/2015   CHOLHDL 3 03/10/2015      Assessment & Plan:  Hypothyroidism, new: it is up to you if you want to take thyroid medication, or to recheck in the  spring.   Renal insufficiency: stable: we'll continue to follow the kidney blood test. It needs no treatment, except to keep you blood pressure and cholesterol good. HTN: well-controlled Dyslipidemia: well-controlled. Please continue the same medication for blood pressure and cholesterol.    Subjective:   Patient here for Medicare annual wellness visit and management of other chronic and acute problems.     Risk factors: advanced age    97 of Physicians Providing Medical Care to Patient:  See "snapshot"   Activities of Daily Living: In your present state of health, do you have any difficulty performing the following activities?:  Preparing food and eating?: No  Bathing yourself: No  Getting dressed: No  Using the toilet:No  Moving around from place to place: No  In the past year have you fallen or had a near fall?: No    Home Safety: Has smoke detector and wears seat belts. No firearms. No excess sun exposure.  Diet and Exercise  Current exercise habits: pt says good Dietary issues discussed: pt reports a healthy diet   Depression Screen  Q1: Over the past two weeks, have you felt down, depressed or hopeless? no  Q2: Over the past two weeks, have you felt little interest or pleasure in doing things? no   The following portions of the patient's history were reviewed and updated as appropriate: allergies, current medications, past family history, past medical history, past social history, past surgical history and problem list.   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  remembers 3/3 at 5 minutes.  excellent recall.  can easily read and write a  sentence.  alert and oriented x 3   Assessment:   Medicare wellness utd on preventive parameters    Plan:   During the course of the visit the patient was educated and counseled about appropriate screening and preventive services including:        Fall prevention   Diabetes screening  Nutrition counseling   Vaccines / LABS Zostavax / Pneumococcal Vaccine  today  PSA  Patient Instructions (the written plan) was given to the patient.

## 2015-03-14 NOTE — Patient Instructions (Addendum)
It is up to you if you want to take thyroid medication, or to recheck in the spring.   We'll continue to follow the kidney blood test. It needs no treatment, except to keep you blood pressure and cholesterol good. Please continue the same medication for blood pressure and cholesterol, as both are good now.   please consider these measures for your health:  minimize alcohol.  do not use tobacco products.  have a colonoscopy at least every 10 years from age 34.  keep firearms safely stored.  always use seat belts.  have working smoke alarms in your home.  see an eye doctor and dentist regularly.  never drive under the influence of alcohol or drugs (including prescription drugs).  those with fair skin should take precautions against the sun. it is critically important to prevent falling down (keep floor areas well-lit, dry, and free of loose objects.  If you have a cane, walker, or wheelchair, you should use it, even for short trips around the house.  Also, try not to rush) Please come back for a follow-up appointment in 6 months.

## 2015-03-24 DIAGNOSIS — L821 Other seborrheic keratosis: Secondary | ICD-10-CM | POA: Diagnosis not present

## 2015-03-24 DIAGNOSIS — D2239 Melanocytic nevi of other parts of face: Secondary | ICD-10-CM | POA: Diagnosis not present

## 2015-03-24 DIAGNOSIS — D2262 Melanocytic nevi of left upper limb, including shoulder: Secondary | ICD-10-CM | POA: Diagnosis not present

## 2015-03-24 DIAGNOSIS — D225 Melanocytic nevi of trunk: Secondary | ICD-10-CM | POA: Diagnosis not present

## 2015-03-24 DIAGNOSIS — L570 Actinic keratosis: Secondary | ICD-10-CM | POA: Diagnosis not present

## 2015-03-24 DIAGNOSIS — Z85828 Personal history of other malignant neoplasm of skin: Secondary | ICD-10-CM | POA: Diagnosis not present

## 2015-03-26 ENCOUNTER — Encounter: Payer: Self-pay | Admitting: Cardiovascular Disease

## 2015-03-26 ENCOUNTER — Ambulatory Visit (INDEPENDENT_AMBULATORY_CARE_PROVIDER_SITE_OTHER): Payer: Medicare Other | Admitting: Cardiovascular Disease

## 2015-03-26 VITALS — BP 132/52 | HR 63 | Ht 71.0 in | Wt 196.2 lb

## 2015-03-26 DIAGNOSIS — I10 Essential (primary) hypertension: Secondary | ICD-10-CM | POA: Diagnosis not present

## 2015-03-26 NOTE — Patient Instructions (Signed)
Medication Instructions:  None  Labwork: None  Testing/Procedures: None  Follow-Up: Your physician wants you to follow-up in: 1 year with Dr. Cooper. You will receive a reminder letter in the mail two months in advance. If you don't receive a letter, please call our office to schedule the follow-up appointment.   Any Other Special Instructions Will Be Listed Below (If Applicable).   

## 2015-03-26 NOTE — Progress Notes (Signed)
Cardiology Office Note   Date:  03/26/2015   ID:  ZARIF LES, DOB [birth year], MRN [id number]  PCP:  Renato Shin, MD  Cardiologist:  Sherren Mocha, MD    Chief Complaint  Patient presents with  . Hypertension     History of Present Illness: [name] is a 81 y.o. male who presents for follow-up evaluation. The patient has been followed for hypertension and remote history of syncope. He also has chronic kidney and hypercholesterolemia, followed by Dr. Loanne Drilling.  The patient is doing well at present. He remains physically active. He has not been playing as much tenderness is in the past because of left shoulder problems. He denies exertional chest pain, chest pressure, shortness of breath, lightheadedness, or syncope. He denies heart palpitations or leg swelling. His renal function has been stable with most recent creatinine of 1.7.   Past Medical History  Diagnosis Date  . PROSTATE CANCER 11/07/2007  . THYROID NODULE, RIGHT 11/13/2008  . DIABETES MELLITUS, TYPE II 02/04/2007  . HYPERLIPIDEMIA 12/04/2007  . UNSPECIFIED ANEMIA 11/07/2007  . THROMBOCYTOPENIA 04/17/2008  . DEPRESSION 02/04/2007  . OBSTRUCTIVE SLEEP APNEA 11/05/2009    -PSG 01/05/10 RDI 11, PLMI 56  . GLAUCOMA 03/08/2009  . HYPERTENSION 02/04/2007  . BRADYCARDIA 11/13/2008  . DIVERTICULOSIS, COLON 02/04/2007  . RENAL INSUFFICIENCY 11/13/2008  . OSTEOARTHRITIS 02/04/2007  . COLONIC POLYPS, HX OF 02/04/2007  . FOREIGN BODY, ASPIRATION 12/04/2007  . SYNCOPE 12/31/2009  . Cholelithiasis   . GERD (gastroesophageal reflux disease)   . Peptic stricture of esophagus     Past Surgical History  Procedure Laterality Date  . Prostatectomy  1997  . Heart cartherization  2000  . Tonsillectomy  1940's    Current Outpatient Prescriptions  Medication Sig Dispense Refill  . aspirin 81 MG tablet Take 81 mg by mouth daily.    . Cholecalciferol (VITAMIN D) 2000 UNITS CAPS Take 1 capsule by mouth daily.      . lansoprazole  (PREVACID) 30 MG capsule Take 30 mg by mouth daily at 12 noon.    Marland Kitchen lisinopril (PRINIVIL,ZESTRIL) 5 MG tablet Take 1 tablet (5 mg total) by mouth daily. 90 tablet 3  . rosuvastatin (CRESTOR) 40 MG tablet Take 40 mg by mouth daily.    . traZODone (DESYREL) 100 MG tablet Take 1 tablet (100 mg total) by mouth at bedtime. 30 tablet 11   No current facility-administered medications for this visit.    Allergies:   Lactose intolerance (gi) and Sulfonamide derivatives   Social History:  The patient  reports that he quit smoking about 21 years ago. His smoking use included Cigarettes and Pipe. He has never used smokeless tobacco. He reports that he drinks about 0.6 oz of alcohol per week. He reports that he does not use illicit drugs.   Family History:  The patient's  family history includes Kidney disease in his father. There is no history of Heart disease or Cancer.    ROS:  Please see the history of present illness.  Otherwise, review of systems is positive for shoulder pain.  All other systems are reviewed and negative.    PHYSICAL EXAM: VS:  BP 132/52 mmHg  Pulse 63  Ht 5\' 11"  (1.803 m)  Wt 196 lb 3.2 oz (88.996 kg)  BMI 27.38 kg/m2 , BMI Body mass index is 27.38 kg/(m^2). GEN: Well nourished, well developed, in no acute distress HEENT: normal Neck: no JVD, no masses. No carotid bruits Cardiac: RRR with soft systolic  ejection murmur at the right upper sternal border      Respiratory:  clear to auscultation bilaterally, normal work of breathing GI: soft, nontender, nondistended, + BS MS: no deformity or atrophy Ext: no pretibial edema, pedal pulses 2+= bilaterally Skin: warm and dry, no rash Neuro:  Strength and sensation are intact Psych: euthymic mood, full affect  EKG:  EKG is ordered today. The ekg ordered today shows normal sinus rhythm 63 bpm, within normal limits.  Recent Labs: 01/02/2015: Hemoglobin 12.2*; Platelets 117.0* 03/10/2015: ALT 23; BUN 41*; Creatinine, Ser 1.68*;  Potassium 4.4; Sodium 142; TSH 5.89*   Lipid Panel     Component Value Date/Time   CHOL 111 03/10/2015 0957   TRIG 90.0 03/10/2015 0957   TRIG 48 04/05/2006 0750   HDL 34.50* 03/10/2015 0957   CHOLHDL 3 03/10/2015 0957   CHOLHDL 2.6 CALC 04/05/2006 0750   VLDL 18.0 03/10/2015 0957   LDLCALC 58 03/10/2015 0957      Wt Readings from Last 3 Encounters:  03/26/15 196 lb 3.2 oz (88.996 kg)  03/14/15 193 lb (87.544 kg)  01/02/15 193 lb (87.544 kg)    ASSESSMENT AND PLAN: 1.  Hypertension: Blood pressure is controlled. Appropriate treatment with lisinopril in the setting of chronic kidney disease. Patient to follow-up in one year.  2. Hyperlipidemia: The patient is treated with Crestor 40 mg daily. Most recent lipids reviewed and are at goal with an LDL of 58.  3. Syncope, remote: No recurrence in several years.  Current medicines are reviewed with the patient today.  The patient does not have concerns regarding medicines.  Labs/ tests ordered today include:   Orders Placed This Encounter  Procedures  . EKG 12-Lead    Disposition:   FU one year  Signed, Sherren Mocha, MD  03/26/2015 5:42 PM    [facility] [address], [place], [place]  [address] Phone: [phone]; Fax: [phone]

## 2015-04-04 ENCOUNTER — Ambulatory Visit: Payer: Medicare Other | Admitting: Endocrinology

## 2015-04-16 ENCOUNTER — Encounter: Payer: Self-pay | Admitting: Sports Medicine

## 2015-04-16 ENCOUNTER — Ambulatory Visit (INDEPENDENT_AMBULATORY_CARE_PROVIDER_SITE_OTHER): Payer: Medicare Other | Admitting: Sports Medicine

## 2015-04-16 VITALS — BP 128/57 | Ht 73.0 in | Wt 190.0 lb

## 2015-04-16 DIAGNOSIS — S76319A Strain of muscle, fascia and tendon of the posterior muscle group at thigh level, unspecified thigh, initial encounter: Secondary | ICD-10-CM | POA: Insufficient documentation

## 2015-04-16 DIAGNOSIS — S76312A Strain of muscle, fascia and tendon of the posterior muscle group at thigh level, left thigh, initial encounter: Secondary | ICD-10-CM

## 2015-04-16 DIAGNOSIS — M67912 Unspecified disorder of synovium and tendon, left shoulder: Secondary | ICD-10-CM | POA: Diagnosis not present

## 2015-04-16 MED ORDER — NITROGLYCERIN 0.1 MG/HR TD PT24: MEDICATED_PATCH | TRANSDERMAL | Status: DC

## 2015-04-16 NOTE — Assessment & Plan Note (Signed)
Nitroglycerin protocol  Easy range of motion exercises  Home exercises for supraspinatous with 1 pound weight  Recheck if not better in 6 weeks

## 2015-04-16 NOTE — Progress Notes (Signed)
Patient ID: [name], male   DOB: 03/13/1928, 81 y.o.   MRN: [id number]  Older patient with left shoulder pain and some left hamstring tightness He continues playing tennis 3-4 times weekly injury is in his nondominant arm He does not recall a specific injury He feels is from throwing a tennis ball up He gets nighttime pain if he sleeps on that left side He gets mild pain if he is reaching for something overhead He does not feel weak  Hamstring does not bother him walking He feels he may have aggravated this running for a drop shot now bothers him with sitting in a car  Past medical history; medications for hypertension, high cholesterol and reflux He has an extensive list of past medical problems but most of these are stable and not causing symptoms  Review of systems; no joint swelling in other joints/ no redness around the shoulder/ no rash/ no radicular symptoms into the left arm  Physical exam Elderly white male uses a hearing aid and in no acute distress BP 128/57 mmHg  Ht 6\' 1"  (1.854 m)  Wt 190 lb (86.183 kg)  BMI 25.07 kg/m2  Left Shoulder: Inspection reveals no abnormalities, atrophy or asymmetry. Palpation is normal with no tenderness over AC joint or bicipital groove. ROM is Limited about 20 in abduction and elevation and with back scratch. Rotator cuff strength normal throughout except SST with elevation + signs of impingement withHawkin's tests, empty can. Speeds and Yergason's tests normal. No labral pathology noted with negative Obrien's, negative clunk and good stability. Normal scapular function observed. No painful arc and no drop arm sign. No apprehension sign  Hamstring Good strength No tenderness to palpation at the ischial tuberosity Some tenderness over the piriformis on deep palpation Tightness on straight leg raise  Ultrasound left shoulder There is some calcific change in the supraspinatous tendon A.c. Joint shows  arthritis Infraspinous, teres minor, subscapularis tendons are all normal Bicipital tendon normal Glenohumeral joint does not look arthritic

## 2015-04-16 NOTE — Patient Instructions (Signed)

## 2015-04-16 NOTE — Assessment & Plan Note (Signed)
I suggested easy hip rotation to stretches piriformis  Extender exercises for the hamstring  Recheck if this persists

## 2015-06-03 ENCOUNTER — Telehealth: Payer: Self-pay | Admitting: Endocrinology

## 2015-06-03 MED ORDER — LANSOPRAZOLE 30 MG PO CPDR: 30.0000 mg | DELAYED_RELEASE_CAPSULE | Freq: Every day | ORAL | Status: DC

## 2015-06-03 NOTE — Telephone Encounter (Signed)
Rx submitted per pt's request.  

## 2015-06-03 NOTE — Telephone Encounter (Signed)
Patient ask if Dr Loanne Drilling would call [facility] would call and renew his prescription Lansoprazole, today if possible

## 2015-06-03 NOTE — Telephone Encounter (Signed)
Please refill prn 

## 2015-06-04 ENCOUNTER — Telehealth: Payer: Self-pay | Admitting: Endocrinology

## 2015-06-04 MED ORDER — ROSUVASTATIN CALCIUM 40 MG PO TABS: 40.0000 mg | ORAL_TABLET | Freq: Every day | ORAL | Status: DC

## 2015-06-04 NOTE — Telephone Encounter (Signed)
Rx submitted per pt's request.  

## 2015-06-04 NOTE — Telephone Encounter (Signed)
Patient need another refill of   rosuvastatin (CRESTOR) 40 MG tablet   He lost his last medication Send to [facility] phone # [phone]

## 2015-06-25 ENCOUNTER — Encounter: Payer: Self-pay | Admitting: *Deleted

## 2015-07-21 ENCOUNTER — Other Ambulatory Visit: Payer: Self-pay | Admitting: Endocrinology

## 2015-08-14 DIAGNOSIS — H40013 Open angle with borderline findings, low risk, bilateral: Secondary | ICD-10-CM | POA: Diagnosis not present

## 2015-08-14 DIAGNOSIS — Z961 Presence of intraocular lens: Secondary | ICD-10-CM | POA: Diagnosis not present

## 2015-08-14 DIAGNOSIS — E119 Type 2 diabetes mellitus without complications: Secondary | ICD-10-CM | POA: Diagnosis not present

## 2015-08-14 DIAGNOSIS — D3132 Benign neoplasm of left choroid: Secondary | ICD-10-CM | POA: Diagnosis not present

## 2015-08-14 DIAGNOSIS — H5702 Anisocoria: Secondary | ICD-10-CM | POA: Diagnosis not present

## 2015-08-18 ENCOUNTER — Other Ambulatory Visit: Payer: Self-pay | Admitting: Endocrinology

## 2015-08-21 DIAGNOSIS — H35373 Puckering of macula, bilateral: Secondary | ICD-10-CM | POA: Diagnosis not present

## 2015-08-21 DIAGNOSIS — D3132 Benign neoplasm of left choroid: Secondary | ICD-10-CM | POA: Diagnosis not present

## 2015-09-20 ENCOUNTER — Other Ambulatory Visit: Payer: Self-pay | Admitting: Endocrinology

## 2015-10-06 DIAGNOSIS — M542 Cervicalgia: Secondary | ICD-10-CM | POA: Diagnosis not present

## 2015-10-06 DIAGNOSIS — M19011 Primary osteoarthritis, right shoulder: Secondary | ICD-10-CM | POA: Diagnosis not present

## 2015-10-06 DIAGNOSIS — M5020 Other cervical disc displacement, unspecified cervical region: Secondary | ICD-10-CM | POA: Diagnosis not present

## 2015-10-06 DIAGNOSIS — M19012 Primary osteoarthritis, left shoulder: Secondary | ICD-10-CM | POA: Diagnosis not present

## 2015-10-06 DIAGNOSIS — M549 Dorsalgia, unspecified: Secondary | ICD-10-CM | POA: Diagnosis not present

## 2015-10-07 DIAGNOSIS — M50222 Other cervical disc displacement at C5-C6 level: Secondary | ICD-10-CM | POA: Diagnosis not present

## 2015-10-09 DIAGNOSIS — M5020 Other cervical disc displacement, unspecified cervical region: Secondary | ICD-10-CM | POA: Diagnosis not present

## 2015-10-09 DIAGNOSIS — M4802 Spinal stenosis, cervical region: Secondary | ICD-10-CM | POA: Diagnosis not present

## 2015-10-13 DIAGNOSIS — M5020 Other cervical disc displacement, unspecified cervical region: Secondary | ICD-10-CM | POA: Diagnosis not present

## 2015-10-13 DIAGNOSIS — M4802 Spinal stenosis, cervical region: Secondary | ICD-10-CM | POA: Diagnosis not present

## 2015-10-15 DIAGNOSIS — M5020 Other cervical disc displacement, unspecified cervical region: Secondary | ICD-10-CM | POA: Diagnosis not present

## 2015-10-15 DIAGNOSIS — M4802 Spinal stenosis, cervical region: Secondary | ICD-10-CM | POA: Diagnosis not present

## 2015-10-17 DIAGNOSIS — M4802 Spinal stenosis, cervical region: Secondary | ICD-10-CM | POA: Diagnosis not present

## 2015-10-17 DIAGNOSIS — M5020 Other cervical disc displacement, unspecified cervical region: Secondary | ICD-10-CM | POA: Diagnosis not present

## 2015-10-20 DIAGNOSIS — M4802 Spinal stenosis, cervical region: Secondary | ICD-10-CM | POA: Diagnosis not present

## 2015-10-20 DIAGNOSIS — M5020 Other cervical disc displacement, unspecified cervical region: Secondary | ICD-10-CM | POA: Diagnosis not present

## 2015-10-27 DIAGNOSIS — M4802 Spinal stenosis, cervical region: Secondary | ICD-10-CM | POA: Diagnosis not present

## 2015-10-27 DIAGNOSIS — M5020 Other cervical disc displacement, unspecified cervical region: Secondary | ICD-10-CM | POA: Diagnosis not present

## 2015-10-28 ENCOUNTER — Other Ambulatory Visit: Payer: Self-pay | Admitting: Endocrinology

## 2015-10-28 DIAGNOSIS — M5412 Radiculopathy, cervical region: Secondary | ICD-10-CM | POA: Diagnosis not present

## 2015-10-28 DIAGNOSIS — M19019 Primary osteoarthritis, unspecified shoulder: Secondary | ICD-10-CM | POA: Diagnosis not present

## 2015-10-28 DIAGNOSIS — M542 Cervicalgia: Secondary | ICD-10-CM | POA: Diagnosis not present

## 2015-10-29 DIAGNOSIS — M5020 Other cervical disc displacement, unspecified cervical region: Secondary | ICD-10-CM | POA: Diagnosis not present

## 2015-10-29 DIAGNOSIS — M4802 Spinal stenosis, cervical region: Secondary | ICD-10-CM | POA: Diagnosis not present

## 2015-10-30 DIAGNOSIS — H43813 Vitreous degeneration, bilateral: Secondary | ICD-10-CM | POA: Diagnosis not present

## 2015-10-30 DIAGNOSIS — D3132 Benign neoplasm of left choroid: Secondary | ICD-10-CM | POA: Diagnosis not present

## 2015-10-30 DIAGNOSIS — H35373 Puckering of macula, bilateral: Secondary | ICD-10-CM | POA: Diagnosis not present

## 2015-10-31 DIAGNOSIS — M4802 Spinal stenosis, cervical region: Secondary | ICD-10-CM | POA: Diagnosis not present

## 2015-10-31 DIAGNOSIS — M5020 Other cervical disc displacement, unspecified cervical region: Secondary | ICD-10-CM | POA: Diagnosis not present

## 2015-11-03 DIAGNOSIS — M4802 Spinal stenosis, cervical region: Secondary | ICD-10-CM | POA: Diagnosis not present

## 2015-11-03 DIAGNOSIS — M5020 Other cervical disc displacement, unspecified cervical region: Secondary | ICD-10-CM | POA: Diagnosis not present

## 2015-11-04 DIAGNOSIS — M5020 Other cervical disc displacement, unspecified cervical region: Secondary | ICD-10-CM | POA: Diagnosis not present

## 2015-11-04 DIAGNOSIS — M4802 Spinal stenosis, cervical region: Secondary | ICD-10-CM | POA: Diagnosis not present

## 2015-11-12 ENCOUNTER — Encounter: Payer: Self-pay | Admitting: Family Medicine

## 2015-11-12 ENCOUNTER — Ambulatory Visit (INDEPENDENT_AMBULATORY_CARE_PROVIDER_SITE_OTHER): Payer: Medicare Other | Admitting: Family Medicine

## 2015-11-12 VITALS — BP 104/58 | Ht 73.0 in | Wt 188.0 lb

## 2015-11-12 DIAGNOSIS — M5090 Cervical disc disorder, unspecified, unspecified cervical region: Secondary | ICD-10-CM

## 2015-11-12 NOTE — Assessment & Plan Note (Signed)
Improved. Continue physical therapy if needed. Otherwise continue NSAIDs and home exercise program. Cleared for tennis.

## 2015-11-12 NOTE — Progress Notes (Signed)
  HOMER CHRISTOPHER - 82 y.o. male MRN [id number]  Date of birth: 08/04/1927 DYSHON ENGERT is a 82 y.o. male who presents today for neck follow up.  Neck herniated disc and DJD, f/u visit 11/12/15 - patient with no symptoms this time and is wondering if no back to playing tennis. Previously a tried physical therapy in [place] which helped. He is also done medication including NSAIDs. No complaints today  Past medical surgical family history exam findings are noted and reviewed.  ROS Per HPI.  12 point negative other than per HPI.   Exam:  Filed Vitals:   11/12/15 1359  BP: 104/58   Gen: NAD, AAO 3 Cardio- RRR Pulm - Normal respiratory effort/rate Skin: No rashes or erythema Extremities: No edema  Vascular: pulses +2 bilateral upper and lower extremity Psych: Normal affect  Neck - Neck: Negative spurling's Full neck range of motion Grip strength and sensation normal in bilateral hands Strength good C4 to T1 distribution No sensory change to C4 to T1 Reflexes normal   Imaging:  Mild disc protrusion at C6-C7 on MRI

## 2015-12-04 ENCOUNTER — Telehealth: Payer: Self-pay | Admitting: Endocrinology

## 2015-12-04 NOTE — Telephone Encounter (Signed)
Patient is very upset because prescription was not filled, he stated that if it is not done today he will be here in person to speak to Dr Loanne Drilling himself

## 2015-12-04 NOTE — Telephone Encounter (Signed)
Lisinopril and omeprazole need to [facility].  Please have done today if possible and please call the pt at the 299

## 2015-12-05 DIAGNOSIS — M50322 Other cervical disc degeneration at C5-C6 level: Secondary | ICD-10-CM | POA: Diagnosis not present

## 2015-12-05 DIAGNOSIS — M542 Cervicalgia: Secondary | ICD-10-CM | POA: Diagnosis not present

## 2015-12-05 MED ORDER — LISINOPRIL 5 MG PO TABS: 5.0000 mg | ORAL_TABLET | Freq: Every day | ORAL | Status: DC

## 2015-12-05 MED ORDER — OMEPRAZOLE 40 MG PO CPDR: 40.0000 mg | DELAYED_RELEASE_CAPSULE | Freq: Every day | ORAL | Status: DC

## 2015-12-05 NOTE — Telephone Encounter (Signed)
I contacted the pt and advised Dr. Loanne Drilling and myself were out of the office yesterday afternoon. Pt advised the rx's for lisinopril and prilosec have been submitted to [facility]. Pt voiced understanding.

## 2015-12-05 NOTE — Telephone Encounter (Signed)
I contacted the pt and advised rx's has been submitted.

## 2015-12-05 NOTE — Addendum Note (Signed)
Addended by: Verlin Grills T on: 12/05/2015 09:20 AM   Modules accepted: Orders, Medications

## 2015-12-08 DIAGNOSIS — M542 Cervicalgia: Secondary | ICD-10-CM | POA: Diagnosis not present

## 2015-12-10 ENCOUNTER — Ambulatory Visit (INDEPENDENT_AMBULATORY_CARE_PROVIDER_SITE_OTHER): Payer: Medicare Other | Admitting: Endocrinology

## 2015-12-10 ENCOUNTER — Encounter: Payer: Self-pay | Admitting: Endocrinology

## 2015-12-10 VITALS — BP 120/58 | HR 60 | Ht 71.0 in | Wt 196.0 lb

## 2015-12-10 DIAGNOSIS — N183 Chronic kidney disease, stage 3 unspecified: Secondary | ICD-10-CM

## 2015-12-10 DIAGNOSIS — E039 Hypothyroidism, unspecified: Secondary | ICD-10-CM

## 2015-12-10 DIAGNOSIS — D649 Anemia, unspecified: Secondary | ICD-10-CM

## 2015-12-10 DIAGNOSIS — N289 Disorder of kidney and ureter, unspecified: Secondary | ICD-10-CM

## 2015-12-10 DIAGNOSIS — E1122 Type 2 diabetes mellitus with diabetic chronic kidney disease: Secondary | ICD-10-CM

## 2015-12-10 LAB — CBC WITH DIFFERENTIAL/PLATELET
BASOS PCT: 0.4 % (ref 0.0–3.0)
Basophils Absolute: 0 10*3/uL (ref 0.0–0.1)
EOS ABS: 0.2 10*3/uL (ref 0.0–0.7)
EOS PCT: 2.7 % (ref 0.0–5.0)
HEMATOCRIT: 33.8 % — AB (ref 39.0–52.0)
Hemoglobin: 11.2 g/dL — ABNORMAL LOW (ref 13.0–17.0)
LYMPHS ABS: 2.4 10*3/uL (ref 0.7–4.0)
Lymphocytes Relative: 27.9 % (ref 12.0–46.0)
MCHC: 33.1 g/dL (ref 30.0–36.0)
MCV: 94.8 fl (ref 78.0–100.0)
MONO ABS: 0.8 10*3/uL (ref 0.1–1.0)
Monocytes Relative: 9.5 % (ref 3.0–12.0)
NEUTROS PCT: 59.5 % (ref 43.0–77.0)
Neutro Abs: 5.1 10*3/uL (ref 1.4–7.7)
PLATELETS: 167 10*3/uL (ref 150.0–400.0)
RBC: 3.57 Mil/uL — AB (ref 4.22–5.81)
RDW: 14.5 % (ref 11.5–15.5)
WBC: 8.6 10*3/uL (ref 4.0–10.5)

## 2015-12-10 LAB — IBC PANEL
IRON: 93 ug/dL (ref 42–165)
SATURATION RATIOS: 31.2 % (ref 20.0–50.0)
TRANSFERRIN: 213 mg/dL (ref 212.0–360.0)

## 2015-12-10 LAB — BASIC METABOLIC PANEL
BUN: 35 mg/dL — AB (ref 6–23)
CO2: 27 mEq/L (ref 19–32)
CREATININE: 1.76 mg/dL — AB (ref 0.40–1.50)
Calcium: 8.9 mg/dL (ref 8.4–10.5)
Chloride: 108 mEq/L (ref 96–112)
GFR: 39.2 mL/min — AB (ref >60.00)
GLUCOSE: 145 mg/dL — AB (ref 70–99)
POTASSIUM: 4.4 meq/L (ref 3.5–5.1)
Sodium: 140 mEq/L (ref 135–145)

## 2015-12-10 LAB — TSH: TSH: 4.08 u[IU]/mL (ref 0.35–4.50)

## 2015-12-10 LAB — POCT GLYCOSYLATED HEMOGLOBIN (HGB A1C): HEMOGLOBIN A1C: 5.7

## 2015-12-10 NOTE — Patient Instructions (Addendum)
No medication is needed for the diabetes now.   blood tests are requested for you today.  We'll let you know about the results.   Please come back for a follow-up appointment in 4-6 months.

## 2015-12-10 NOTE — Progress Notes (Signed)
Subjective:    Patient ID: [name], male    DOB: Aug 13, 1927, 82 y.o.   MRN: [id number]  HPI  The state of at least three ongoing medical problems is addressed today, with interval history of each noted here: DM: no weight change.   Hypothyroidism: no edema. Renal failure: he denies dysuria.  Past Medical History  Diagnosis Date  . PROSTATE CANCER 11/07/2007  . THYROID NODULE, RIGHT 11/13/2008  . DIABETES MELLITUS, TYPE II 02/04/2007  . HYPERLIPIDEMIA 12/04/2007  . UNSPECIFIED ANEMIA 11/07/2007  . THROMBOCYTOPENIA 04/17/2008  . DEPRESSION 02/04/2007  . OBSTRUCTIVE SLEEP APNEA 11/05/2009    -PSG 01/05/10 RDI 11, PLMI 56  . GLAUCOMA 03/08/2009  . HYPERTENSION 02/04/2007  . BRADYCARDIA 11/13/2008  . DIVERTICULOSIS, COLON 02/04/2007  . RENAL INSUFFICIENCY 11/13/2008  . OSTEOARTHRITIS 02/04/2007  . COLONIC POLYPS, HX OF 02/04/2007  . FOREIGN BODY, ASPIRATION 12/04/2007  . SYNCOPE 12/31/2009  . Cholelithiasis   . GERD (gastroesophageal reflux disease)   . Peptic stricture of esophagus     Past Surgical History  Procedure Laterality Date  . Prostatectomy  1997  . Heart cartherization  2000  . Tonsillectomy  1940's    Social History   Social History  . Marital Status: Married    Spouse Name: N/A  . Number of Children: 3  . Years of Education: N/A   Occupational History  . retired     Retired   Social History Main Topics  . Smoking status: Former Smoker    Types: Cigarettes, Pipe    Quit date: 07/08/1993  . Smokeless tobacco: Never Used  . Alcohol Use: 0.6 oz/week    1 Shots of liquor per week     Comment: 1 shot a day  . Drug Use: No  . Sexual Activity: Not on file   Other Topics Concern  . Not on file   Social History Narrative   He plays tennis 3x a week    Current Outpatient Prescriptions on File Prior to Visit  Medication Sig Dispense Refill  . aspirin 81 MG tablet Take 81 mg by mouth daily.    . Cholecalciferol (VITAMIN D) 2000 UNITS CAPS Take 1 capsule by mouth  daily.      Marland Kitchen lisinopril (PRINIVIL,ZESTRIL) 5 MG tablet Take 1 tablet (5 mg total) by mouth daily. 30 tablet 3  . omeprazole (PRILOSEC) 40 MG capsule Take 1 capsule (40 mg total) by mouth daily. 30 capsule 3  . rosuvastatin (CRESTOR) 40 MG tablet Take 1 tablet (40 mg total) by mouth daily. 90 tablet 3  . traZODone (DESYREL) 100 MG tablet TAKE 1 TABLET BY MOUTH ATBEDTIME 30 tablet 0   No current facility-administered medications on file prior to visit.    Allergies  Allergen Reactions  . Lactose Intolerance (Gi)   . Sulfonamide Derivatives     REACTION: Itch/Red Spots    Family History  Problem Relation Age of Onset  . Kidney disease Father   . Heart disease Neg Hx     No FH of Coronary Artery Disease  . Cancer Neg Hx     BP 120/58 mmHg  Pulse 60  Ht 5\' 11"  (1.803 m)  Wt 196 lb (88.905 kg)  BMI 27.35 kg/m2  SpO2 95%   Review of Systems Denies chest pain and sob.     Objective:   Physical Exam VITAL SIGNS:  See vs page.  GENERAL: no distress.  NECK: There is no palpable thyroid enlargement.  No thyroid nodule  is palpable.  No palpable lymphadenopathy at the anterior neck.    A1c=5.7% Lab Results  Component Value Date   CREATININE 1.76* 12/10/2015   BUN 35* 12/10/2015   NA 140 12/10/2015   K 4.4 12/10/2015   CL 108 12/10/2015   CO2 27 12/10/2015   Lab Results  Component Value Date   WBC 8.6 12/10/2015   HGB 11.2* 12/10/2015   HCT 33.8* 12/10/2015   MCV 94.8 12/10/2015   PLT 167.0 12/10/2015   Lab Results  Component Value Date   TSH 4.08 12/10/2015      Assessment & Plan:  Type 2 DM: well-controlled.  Hypothyroidism: well-replaced.  Same rx renal failure: stable.  We'll follow Anemia: stable.  We'll follow   Patient is advised the following: Patient Instructions  No medication is needed for the diabetes now.   blood tests are requested for you today.  We'll let you know about the results.   Please come back for a follow-up appointment in 4-6  months.    Renato Shin, MD

## 2015-12-12 LAB — PTH, INTACT AND CALCIUM
Calcium: 8.8 mg/dL (ref 8.4–10.5)
PTH: 53 pg/mL (ref 14–64)

## 2015-12-16 DIAGNOSIS — M542 Cervicalgia: Secondary | ICD-10-CM | POA: Diagnosis not present

## 2015-12-16 DIAGNOSIS — M50322 Other cervical disc degeneration at C5-C6 level: Secondary | ICD-10-CM | POA: Diagnosis not present

## 2016-02-02 ENCOUNTER — Other Ambulatory Visit: Payer: Self-pay | Admitting: Endocrinology

## 2016-02-02 NOTE — Telephone Encounter (Signed)
Pt requesting RF of trazadone Last OV 12/10/15 Next OV 03/31/16 Last RX 10/28/15 #30 Okay to RF?

## 2016-02-24 ENCOUNTER — Telehealth: Payer: Self-pay | Admitting: Endocrinology

## 2016-02-24 MED ORDER — TRIAZOLAM 0.125 MG PO TABS
0.1250 mg | ORAL_TABLET | Freq: Every evening | ORAL | 0 refills | Status: DC | PRN
Start: 2016-02-24 — End: 2016-11-15

## 2016-02-24 NOTE — Telephone Encounter (Signed)
I printed.   Do not take trazodone the same day

## 2016-02-24 NOTE — Telephone Encounter (Signed)
See message and please advise, Thanks!  

## 2016-02-24 NOTE — Telephone Encounter (Signed)
Pt and his wife are going on an overnight flight and needs to know if there are sleeping pills he can take

## 2016-02-25 NOTE — Telephone Encounter (Signed)
I contacted the patient and advised we have submitted the triazolam and to not take the trazadone the same day. Patient voiced understanding.

## 2016-02-26 NOTE — Telephone Encounter (Signed)
Rx resubmitted and confirmed at 1025 am on 02/26/2016.

## 2016-02-26 NOTE — Telephone Encounter (Signed)
Patient is calling on the status of  [facility] # [address] [place] [phone] (Phone) [phone] (Fax)

## 2016-02-26 NOTE — Telephone Encounter (Signed)
Please resend the triazolam

## 2016-03-02 NOTE — Telephone Encounter (Signed)
error 

## 2016-03-31 ENCOUNTER — Encounter: Payer: Self-pay | Admitting: Endocrinology

## 2016-03-31 ENCOUNTER — Ambulatory Visit (INDEPENDENT_AMBULATORY_CARE_PROVIDER_SITE_OTHER): Payer: Medicare Other | Admitting: Endocrinology

## 2016-03-31 VITALS — BP 116/68 | HR 55 | Wt 198.6 lb

## 2016-03-31 DIAGNOSIS — N183 Chronic kidney disease, stage 3 (moderate): Secondary | ICD-10-CM | POA: Diagnosis not present

## 2016-03-31 DIAGNOSIS — E041 Nontoxic single thyroid nodule: Secondary | ICD-10-CM

## 2016-03-31 DIAGNOSIS — E1122 Type 2 diabetes mellitus with diabetic chronic kidney disease: Secondary | ICD-10-CM

## 2016-03-31 DIAGNOSIS — D649 Anemia, unspecified: Secondary | ICD-10-CM

## 2016-03-31 DIAGNOSIS — C61 Malignant neoplasm of prostate: Secondary | ICD-10-CM

## 2016-03-31 DIAGNOSIS — Z23 Encounter for immunization: Secondary | ICD-10-CM | POA: Diagnosis not present

## 2016-03-31 LAB — URINALYSIS, ROUTINE W REFLEX MICROSCOPIC
BILIRUBIN URINE: NEGATIVE
Hgb urine dipstick: NEGATIVE
Ketones, ur: NEGATIVE
Leukocytes, UA: NEGATIVE
Nitrite: NEGATIVE
PH: 5 (ref 5.0–8.0)
RBC / HPF: NONE SEEN (ref 0–?)
Specific Gravity, Urine: 1.02 (ref 1.000–1.030)
TOTAL PROTEIN, URINE-UPE24: 30 — AB
UROBILINOGEN UA: 0.2 (ref 0.0–1.0)
Urine Glucose: NEGATIVE

## 2016-03-31 LAB — BASIC METABOLIC PANEL
BUN: 37 mg/dL — AB (ref 6–23)
CALCIUM: 8.8 mg/dL (ref 8.4–10.5)
CHLORIDE: 107 meq/L (ref 96–112)
CO2: 29 meq/L (ref 19–32)
CREATININE: 1.66 mg/dL — AB (ref 0.40–1.50)
GFR: 41.9 mL/min — ABNORMAL LOW (ref >60.00)
GLUCOSE: 122 mg/dL — AB (ref 70–99)
Potassium: 4.6 mEq/L (ref 3.5–5.1)
Sodium: 142 mEq/L (ref 135–145)

## 2016-03-31 LAB — CBC WITH DIFFERENTIAL/PLATELET
Basophils Absolute: 0 10*3/uL (ref 0.0–0.1)
Basophils Relative: 0.1 % (ref 0.0–3.0)
EOS ABS: 0.2 10*3/uL (ref 0.0–0.7)
EOS PCT: 2.2 % (ref 0.0–5.0)
HEMATOCRIT: 38.1 % — AB (ref 39.0–52.0)
Hemoglobin: 12.9 g/dL — ABNORMAL LOW (ref 13.0–17.0)
LYMPHS ABS: 2.7 10*3/uL (ref 0.7–4.0)
Lymphocytes Relative: 32.1 % (ref 12.0–46.0)
MCHC: 33.7 g/dL (ref 30.0–36.0)
MCV: 93.7 fl (ref 78.0–100.0)
MONO ABS: 1 10*3/uL (ref 0.1–1.0)
MONOS PCT: 11.8 % (ref 3.0–12.0)
Neutro Abs: 4.6 10*3/uL (ref 1.4–7.7)
Neutrophils Relative %: 53.8 % (ref 43.0–77.0)
PLATELETS: 115 10*3/uL — AB (ref 150.0–400.0)
RBC: 4.07 Mil/uL — ABNORMAL LOW (ref 4.22–5.81)
RDW: 13.9 % (ref 11.5–15.5)
WBC: 8.6 10*3/uL (ref 4.0–10.5)

## 2016-03-31 LAB — HEPATIC FUNCTION PANEL
ALK PHOS: 61 U/L (ref 39–117)
ALT: 17 U/L (ref 0–53)
AST: 22 U/L (ref 0–37)
Albumin: 3.7 g/dL (ref 3.5–5.2)
BILIRUBIN DIRECT: 0.1 mg/dL (ref 0.0–0.3)
BILIRUBIN TOTAL: 0.5 mg/dL (ref 0.2–1.2)
TOTAL PROTEIN: 6.1 g/dL (ref 6.0–8.3)

## 2016-03-31 LAB — IBC PANEL
Iron: 98 ug/dL (ref 42–165)
SATURATION RATIOS: 33 % (ref 20.0–50.0)
TRANSFERRIN: 212 mg/dL (ref 212.0–360.0)

## 2016-03-31 LAB — LIPID PANEL
CHOL/HDL RATIO: 3
Cholesterol: 116 mg/dL (ref 0–200)
HDL: 36.5 mg/dL — AB (ref >39.00)
LDL CALC: 55 mg/dL (ref 0–99)
NONHDL: 79.38
Triglycerides: 123 mg/dL (ref 0.0–149.0)
VLDL: 24.6 mg/dL (ref 0.0–40.0)

## 2016-03-31 LAB — POCT GLYCOSYLATED HEMOGLOBIN (HGB A1C): HEMOGLOBIN A1C: 5.8

## 2016-03-31 LAB — PSA: PSA: 0 ng/mL — AB (ref 0.10–4.00)

## 2016-03-31 LAB — TSH: TSH: 4.09 u[IU]/mL (ref 0.35–4.50)

## 2016-03-31 NOTE — Progress Notes (Signed)
Subjective:    Patient ID: [name], male    DOB: 03/11/1928, 82 y.o.   MRN: [id number]  HPI The state of at least three ongoing medical problems is addressed today, with interval history of each noted here: DM: no weight change.    Hypothyroidism: he has slight leg edema.   Renal failure: he denies dysuria.  Past Medical History:  Diagnosis Date  . BRADYCARDIA 11/13/2008  . Cholelithiasis   . COLONIC POLYPS, HX OF 02/04/2007  . DEPRESSION 02/04/2007  . DIABETES MELLITUS, TYPE II 02/04/2007  . DIVERTICULOSIS, COLON 02/04/2007  . FOREIGN BODY, ASPIRATION 12/04/2007  . GERD (gastroesophageal reflux disease)   . GLAUCOMA 03/08/2009  . HYPERLIPIDEMIA 12/04/2007  . HYPERTENSION 02/04/2007  . OBSTRUCTIVE SLEEP APNEA 11/05/2009   -PSG 01/05/10 RDI 11, PLMI 56  . OSTEOARTHRITIS 02/04/2007  . Peptic stricture of esophagus   . PROSTATE CANCER 11/07/2007  . RENAL INSUFFICIENCY 11/13/2008  . SYNCOPE 12/31/2009  . THROMBOCYTOPENIA 04/17/2008  . THYROID NODULE, RIGHT 11/13/2008  . UNSPECIFIED ANEMIA 11/07/2007    Past Surgical History:  Procedure Laterality Date  . Heart Cartherization  2000  . PROSTATECTOMY  1997  . TONSILLECTOMY  1940's    Social History   Social History  . Marital status: Married    Spouse name: N/A  . Number of children: 3  . Years of education: N/A   Occupational History  . retired     Retired   Social History Main Topics  . Smoking status: Former Smoker    Types: Cigarettes, Pipe    Quit date: 07/08/1993  . Smokeless tobacco: Never Used  . Alcohol use 0.6 oz/week    1 Shots of liquor per week     Comment: 1 shot a day  . Drug use: No  . Sexual activity: Not on file   Other Topics Concern  . Not on file   Social History Narrative   He plays tennis 3x a week    Current Outpatient Prescriptions on File Prior to Visit  Medication Sig Dispense Refill  . aspirin 81 MG tablet Take 81 mg by mouth daily.    . Cholecalciferol (VITAMIN D) 2000 UNITS CAPS Take 1  capsule by mouth daily.      Marland Kitchen lisinopril (PRINIVIL,ZESTRIL) 5 MG tablet Take 1 tablet (5 mg total) by mouth daily. 30 tablet 3  . omeprazole (PRILOSEC) 40 MG capsule Take 1 capsule (40 mg total) by mouth daily. 30 capsule 3  . rosuvastatin (CRESTOR) 40 MG tablet Take 1 tablet (40 mg total) by mouth daily. 90 tablet 3  . traZODone (DESYREL) 100 MG tablet TAKE 1 TABLET BY MOUTH ATBEDTIME 30 tablet 0  . triazolam (HALCION) 0.125 MG tablet Take 1 tablet (0.125 mg total) by mouth at bedtime as needed for sleep. 4 tablet 0   No current facility-administered medications on file prior to visit.     Allergies  Allergen Reactions  . Lactose Intolerance (Gi)   . Sulfonamide Derivatives     REACTION: Itch/Red Spots    Family History  Problem Relation Age of Onset  . Kidney disease Father   . Heart disease Neg Hx     No FH of Coronary Artery Disease  . Cancer Neg Hx     BP 116/68   Pulse (!) 55   Wt 198 lb 9.6 oz (90.1 kg)   SpO2 98%   BMI 27.70 kg/m    Review of Systems Denies chest pain  Objective:   Physical Exam VS: see vs page GEN: no distress HEAD: head: no deformity eyes: no periorbital swelling, no proptosis external nose and ears are normal mouth: no lesion seen NECK: supple, thyroid is not enlarged.  No palpable nodule.   CHEST WALL: no deformity LUNGS: clear to auscultation CV: reg rate and rhythm, no murmur MUSCULOSKELETAL: muscle bulk and strength are grossly normal.  no obvious joint swelling.  gait is normal and steady EXTEMITIES: no deformity.  no ulcer on the feet.  feet are of normal color and temp.  1+ bilat leg edema PULSES: dorsalis pedis intact bilat.  no carotid bruit NEURO:  cn 2-12 grossly intact.   readily moves all 4's.  sensation is intact to touch on the feet SKIN:  Normal texture and temperature.  No rash or suspicious lesion is visible.   NODES:  None palpable at the neck PSYCH: Does not appear anxious nor depressed.   Lab Results    Component Value Date   HGBA1C 5.8 03/31/2016   Lab Results  Component Value Date   CREATININE 1.66 (H) 03/31/2016   BUN 37 (H) 03/31/2016   NA 142 03/31/2016   K 4.6 03/31/2016   CL 107 03/31/2016   CO2 29 03/31/2016   Lab Results  Component Value Date   TSH 4.09 03/31/2016   Lab Results  Component Value Date   WBC 8.6 03/31/2016   HGB 12.9 (L) 03/31/2016   HCT 38.1 (L) 03/31/2016   MCV 93.7 03/31/2016   PLT 115.0 (L) 03/31/2016      Assessment & Plan:  Type 2 DM: well-controlled. Please continue the same medication Hypothyroidism: well-replaced. Please continue the same medication Renal failure: stable: we'll follow.  Anemia: slightly better: we'll follow    Subjective:   Patient here for Medicare annual wellness visit and management of other chronic and acute problems.     Risk factors: advanced age    95 of Physicians Providing Medical Care to Patient:  See "snapshot"   Activities of Daily Living: In your present state of health, do you have any difficulty performing the following activities (lives with wife)?:  Preparing food and eating?: No  Bathing yourself: No  Getting dressed: No  Using the toilet:No  Moving around from place to place: No  In the past year have you fallen or had a near fall?: No    Home Safety: Has smoke detector and wears seat belts. No firearms. No excess sun exposure.    Diet and Exercise  Current exercise habits: pt says limited by advanced age.   Dietary issues discussed: pt reports a healthy diet.    Depression Screen  Q1: Over the past two weeks, have you felt down, depressed or hopeless? no  Q2: Over the past two weeks, have you felt little interest or pleasure in doing things? no   The following portions of the patient's history were reviewed and updated as appropriate: allergies, current medications, past family history, past medical history, past social history, past surgical history and problem list.   Review of  Systems  Denies hearing loss, and visual loss Objective:   Vision:  Advertising account executive, so he declines VA today. Hearing: grossly normal. Body mass index:  See vs page.  Msk: pt easily and quickly performs "get-up-and-go" from a sitting position Cognitive Impairment Assessment: cognition, memory and judgment appear normal.  remembers 3/3 at 5 minutes.  excellent recall.  can easily read and write a sentence.  alert and oriented x  3.     Assessment:   Medicare wellness utd on preventive parameters    Plan:   During the course of the visit the patient was educated and counseled about appropriate screening and preventive services including:        Fall prevention    Diabetes screening  Nutrition counseling   Vaccines / LABS Zostavax / Pneumococcal Vaccine today.    Patient Instructions (the written plan) was given to the patient.

## 2016-03-31 NOTE — Patient Instructions (Addendum)
good diet and exercise significantly improve the control of your diabetes.  please let me know if you wish to be referred to a dietician.  high blood sugar is very risky to your health.  you should see an eye doctor and dentist every year.  It is very important to get all recommended vaccinations.  Please consider these measures for your health:  minimize alcohol.  Do not use tobacco products.  Have a colonoscopy at least every 10 years from age 80.  keep firearms safely stored.  Always use seat belts.  have working smoke alarms in your home.  See an eye doctor and dentist regularly.  Never drive under the influence of alcohol or drugs (including prescription drugs).  Those with fair skin should take precautions against the sun, and should carefully examine their skin once per month, for any new or changed moles.   It is critically important to prevent falling down (keep floor areas well-lit, dry, and free of loose objects.  If you have a cane, walker, or wheelchair, you should use it, even for short trips around the house.  Wear flat-soled shoes.  Also, try not to rush).   Please come back for a follow-up appointment in 6 months.

## 2016-03-31 NOTE — Progress Notes (Signed)
we discussed code status.  pt requests full code, but would not want to be started or maintained on artificial life-support measures if there was not a reasonable chance of recovery 

## 2016-04-05 ENCOUNTER — Encounter: Payer: Self-pay | Admitting: Cardiovascular Disease

## 2016-04-05 ENCOUNTER — Ambulatory Visit (INDEPENDENT_AMBULATORY_CARE_PROVIDER_SITE_OTHER): Payer: Medicare Other | Admitting: Cardiovascular Disease

## 2016-04-05 ENCOUNTER — Other Ambulatory Visit: Payer: Self-pay | Admitting: Endocrinology

## 2016-04-05 VITALS — BP 130/60 | HR 56 | Ht 72.0 in | Wt 199.0 lb

## 2016-04-05 DIAGNOSIS — N183 Chronic kidney disease, stage 3 unspecified: Secondary | ICD-10-CM

## 2016-04-05 DIAGNOSIS — I129 Hypertensive chronic kidney disease with stage 1 through stage 4 chronic kidney disease, or unspecified chronic kidney disease: Secondary | ICD-10-CM | POA: Diagnosis not present

## 2016-04-05 NOTE — Progress Notes (Signed)
Cardiology Office Note Date:  04/05/2016   ID:  DRAIDEN MIRSKY, DOB 15-Apr-1928, MRN [id number]  PCP:  Renato Shin, MD  Cardiologist:  Sherren Mocha, MD    Chief Complaint  Patient presents with  . Follow-up    Hypertension     History of Present Illness: [name] is a 82 y.o. male who presents for follow-up evaluation. The patient was last seen 03/26/2015. He's been followed for hypertension and remote history of syncope. Other problems include chronic kidney disease and hypercholesterolemia. He is followed regularly by Dr. Loanne Drilling. Blood pressure has been managed with lisinopril and lipids have been managed with high-dose Crestor.  The patient is doing well. He feels well and remains active. He has no exertional symptoms. He denies chest pain, shortness of breath, or heart palpitations. He's had no lightheadedness or syncope. He complains of mild left leg swelling, worse at the end of the day. No pain in the leg. No history of leg injury. No other complaints today. He and his wife are leaving for [place] in a few weeks and they will return early next summer.  Past Medical History:  Diagnosis Date  . BRADYCARDIA 11/13/2008  . Cholelithiasis   . COLONIC POLYPS, HX OF 02/04/2007  . DEPRESSION 02/04/2007  . DIABETES MELLITUS, TYPE II 02/04/2007  . DIVERTICULOSIS, COLON 02/04/2007  . FOREIGN BODY, ASPIRATION 12/04/2007  . GERD (gastroesophageal reflux disease)   . GLAUCOMA 03/08/2009  . HYPERLIPIDEMIA 12/04/2007  . HYPERTENSION 02/04/2007  . OBSTRUCTIVE SLEEP APNEA 11/05/2009   -PSG 01/05/10 RDI 11, PLMI 56  . OSTEOARTHRITIS 02/04/2007  . Peptic stricture of esophagus   . PROSTATE CANCER 11/07/2007  . RENAL INSUFFICIENCY 11/13/2008  . SYNCOPE 12/31/2009  . THROMBOCYTOPENIA 04/17/2008  . THYROID NODULE, RIGHT 11/13/2008  . UNSPECIFIED ANEMIA 11/07/2007    Past Surgical History:  Procedure Laterality Date  . Heart Cartherization  2000  . PROSTATECTOMY  1997  . TONSILLECTOMY  1940's     Current Outpatient Prescriptions  Medication Sig Dispense Refill  . aspirin 81 MG tablet Take 81 mg by mouth daily.    . Cholecalciferol (VITAMIN D) 2000 UNITS CAPS Take 1 capsule by mouth daily.      Marland Kitchen lisinopril (PRINIVIL,ZESTRIL) 5 MG tablet Take 1 tablet (5 mg total) by mouth daily. 30 tablet 3  . omeprazole (PRILOSEC) 40 MG capsule Take 1 capsule (40 mg total) by mouth daily. 30 capsule 3  . rosuvastatin (CRESTOR) 40 MG tablet Take 1 tablet (40 mg total) by mouth daily. 90 tablet 3  . traZODone (DESYREL) 100 MG tablet TAKE 1 TABLET BY MOUTH ATBEDTIME 30 tablet 0  . triazolam (HALCION) 0.125 MG tablet Take 1 tablet (0.125 mg total) by mouth at bedtime as needed for sleep. 4 tablet 0   No current facility-administered medications for this visit.     Allergies:   Lactose intolerance (gi) and Sulfonamide derivatives   Social History:  The patient  reports that he quit smoking about 22 years ago. His smoking use included Cigarettes and Pipe. He has never used smokeless tobacco. He reports that he drinks about 0.6 oz of alcohol per week . He reports that he does not use drugs.   Family History:  The patient's family history includes Kidney disease in his father.    ROS:  Please see the history of present illness.  Otherwise, review of systems is positive for left leg swelling.  All other systems are reviewed and negative.    PHYSICAL  EXAM: VS:  BP 130/60   Pulse (!) 56   Ht 6' (1.829 m)   Wt 199 lb (90.3 kg)   BMI 26.99 kg/m  , BMI Body mass index is 26.99 kg/m. GEN: Well nourished, well developed, in no acute distress  HEENT: normal  Neck: no JVD, no masses. No carotid bruits Cardiac: RRR without murmur or gallop                Respiratory:  clear to auscultation bilaterally, normal work of breathing GI: soft, nontender, nondistended, + BS MS: no deformity or atrophy  Ext: trace left pretibial edema, pedal pulses 2+= bilaterally Skin: warm and dry, no rash Neuro:   Strength and sensation are intact Psych: euthymic mood, full affect  EKG:  EKG is ordered today. The ekg ordered today shows sinus bradycardia 56 bpm, first-degree AV block, low voltage QRS  Recent Labs: 03/31/2016: ALT 17; BUN 37; Creatinine, Ser 1.66; Hemoglobin 12.9; Platelets 115.0; Potassium 4.6; Sodium 142; TSH 4.09   Lipid Panel     Component Value Date/Time   CHOL 116 03/31/2016 1056   TRIG 123.0 03/31/2016 1056   TRIG 48 04/05/2006 0750   HDL 36.50 (L) 03/31/2016 1056   CHOLHDL 3 03/31/2016 1056   VLDL 24.6 03/31/2016 1056   LDLCALC 55 03/31/2016 1056      Wt Readings from Last 3 Encounters:  04/05/16 199 lb (90.3 kg)  03/31/16 198 lb 9.6 oz (90.1 kg)  12/10/15 196 lb (88.9 kg)    ASSESSMENT AND PLAN: 1.  HTN with CKD: BP remains well-controlled. Continue lisinopril.  2. Chronic kidney disease stage III: Recent labs reviewed with a creatinine of 1.66 mg/dL. EGFR is 42. Stability is demonstrated over the last several years.  3. Hyperlipidemia: Recent labs reviewed as above with LDL of 55 mg/dL. Tolerating statin drug without side effects.  Current medicines are reviewed with the patient today.  The patient does not have concerns regarding medicines.  Labs/ tests ordered today include:   Orders Placed This Encounter  Procedures  . EKG 12-Lead    Disposition:   FU one year  Signed, Sherren Mocha, MD  04/05/2016 9:24 AM    [facility] [place], [place], [place]  [address] Phone: [phone]; Fax: [phone]

## 2016-04-05 NOTE — Patient Instructions (Signed)

## 2016-04-07 DIAGNOSIS — L570 Actinic keratosis: Secondary | ICD-10-CM | POA: Diagnosis not present

## 2016-04-07 DIAGNOSIS — D225 Melanocytic nevi of trunk: Secondary | ICD-10-CM | POA: Diagnosis not present

## 2016-04-07 DIAGNOSIS — Z85828 Personal history of other malignant neoplasm of skin: Secondary | ICD-10-CM | POA: Diagnosis not present

## 2016-04-07 DIAGNOSIS — L821 Other seborrheic keratosis: Secondary | ICD-10-CM | POA: Diagnosis not present

## 2016-04-21 ENCOUNTER — Other Ambulatory Visit: Payer: Self-pay

## 2016-04-21 MED ORDER — LISINOPRIL 5 MG PO TABS: 5.0000 mg | ORAL_TABLET | Freq: Every day | ORAL | 1 refills | Status: DC

## 2016-04-22 ENCOUNTER — Telehealth: Payer: Self-pay | Admitting: Endocrinology

## 2016-04-22 MED ORDER — LISINOPRIL 5 MG PO TABS: 5.0000 mg | ORAL_TABLET | Freq: Every day | ORAL | 1 refills | Status: DC

## 2016-04-22 NOTE — Telephone Encounter (Signed)
Refill submitted to the [facility] in [facility] per patient's request.

## 2016-04-22 NOTE — Telephone Encounter (Signed)
Lisinopril needs to be called [facility] in [facility] their # is [phone]  He is temporarily living in [facility]

## 2016-04-28 DIAGNOSIS — Z961 Presence of intraocular lens: Secondary | ICD-10-CM | POA: Diagnosis not present

## 2016-04-28 DIAGNOSIS — H40013 Open angle with borderline findings, low risk, bilateral: Secondary | ICD-10-CM | POA: Diagnosis not present

## 2016-04-28 DIAGNOSIS — E119 Type 2 diabetes mellitus without complications: Secondary | ICD-10-CM | POA: Diagnosis not present

## 2016-04-28 DIAGNOSIS — H02045 Spastic entropion of left lower eyelid: Secondary | ICD-10-CM | POA: Diagnosis not present

## 2016-04-28 DIAGNOSIS — D3132 Benign neoplasm of left choroid: Secondary | ICD-10-CM | POA: Diagnosis not present

## 2016-04-28 DIAGNOSIS — H5702 Anisocoria: Secondary | ICD-10-CM | POA: Diagnosis not present

## 2016-05-03 DIAGNOSIS — H43813 Vitreous degeneration, bilateral: Secondary | ICD-10-CM | POA: Diagnosis not present

## 2016-05-03 DIAGNOSIS — D3132 Benign neoplasm of left choroid: Secondary | ICD-10-CM | POA: Diagnosis not present

## 2016-05-03 DIAGNOSIS — H35373 Puckering of macula, bilateral: Secondary | ICD-10-CM | POA: Diagnosis not present

## 2016-08-05 ENCOUNTER — Other Ambulatory Visit: Payer: Self-pay | Admitting: Endocrinology

## 2016-08-16 ENCOUNTER — Telehealth: Payer: Self-pay | Admitting: Endocrinology

## 2016-08-16 MED ORDER — ROSUVASTATIN CALCIUM 40 MG PO TABS: 40.0000 mg | ORAL_TABLET | Freq: Every day | ORAL | 3 refills | Status: DC

## 2016-08-16 NOTE — Telephone Encounter (Signed)
Refill submitted. 

## 2016-08-16 NOTE — Telephone Encounter (Signed)
Refill of   rosuvastatin (CRESTOR) 40 MG tablet  [facility] # [place], [place] [address] [phone] (Phone) [phone] (Fax)

## 2016-10-08 ENCOUNTER — Other Ambulatory Visit: Payer: Self-pay | Admitting: Endocrinology

## 2016-10-26 DIAGNOSIS — Z961 Presence of intraocular lens: Secondary | ICD-10-CM | POA: Diagnosis not present

## 2016-10-26 DIAGNOSIS — H02045 Spastic entropion of left lower eyelid: Secondary | ICD-10-CM | POA: Diagnosis not present

## 2016-10-26 DIAGNOSIS — E119 Type 2 diabetes mellitus without complications: Secondary | ICD-10-CM | POA: Diagnosis not present

## 2016-10-26 DIAGNOSIS — H40013 Open angle with borderline findings, low risk, bilateral: Secondary | ICD-10-CM | POA: Diagnosis not present

## 2016-10-26 DIAGNOSIS — H5702 Anisocoria: Secondary | ICD-10-CM | POA: Diagnosis not present

## 2016-10-26 DIAGNOSIS — D3132 Benign neoplasm of left choroid: Secondary | ICD-10-CM | POA: Diagnosis not present

## 2016-11-01 DIAGNOSIS — H35373 Puckering of macula, bilateral: Secondary | ICD-10-CM | POA: Diagnosis not present

## 2016-11-01 DIAGNOSIS — H43813 Vitreous degeneration, bilateral: Secondary | ICD-10-CM | POA: Diagnosis not present

## 2016-11-01 DIAGNOSIS — D3132 Benign neoplasm of left choroid: Secondary | ICD-10-CM | POA: Diagnosis not present

## 2016-11-08 ENCOUNTER — Other Ambulatory Visit: Payer: Self-pay | Admitting: Endocrinology

## 2016-11-15 ENCOUNTER — Ambulatory Visit (INDEPENDENT_AMBULATORY_CARE_PROVIDER_SITE_OTHER): Payer: Medicare Other | Admitting: Endocrinology

## 2016-11-15 ENCOUNTER — Encounter: Payer: Self-pay | Admitting: Endocrinology

## 2016-11-15 VITALS — BP 122/72 | HR 59 | Wt 196.0 lb

## 2016-11-15 DIAGNOSIS — E039 Hypothyroidism, unspecified: Secondary | ICD-10-CM

## 2016-11-15 DIAGNOSIS — D649 Anemia, unspecified: Secondary | ICD-10-CM

## 2016-11-15 DIAGNOSIS — C61 Malignant neoplasm of prostate: Secondary | ICD-10-CM

## 2016-11-15 DIAGNOSIS — N183 Chronic kidney disease, stage 3 (moderate): Secondary | ICD-10-CM

## 2016-11-15 DIAGNOSIS — N289 Disorder of kidney and ureter, unspecified: Secondary | ICD-10-CM | POA: Diagnosis not present

## 2016-11-15 DIAGNOSIS — E1122 Type 2 diabetes mellitus with diabetic chronic kidney disease: Secondary | ICD-10-CM

## 2016-11-15 LAB — BASIC METABOLIC PANEL
BUN: 34 mg/dL — ABNORMAL HIGH (ref 6–23)
CHLORIDE: 105 meq/L (ref 96–112)
CO2: 28 meq/L (ref 19–32)
CREATININE: 1.92 mg/dL — AB (ref 0.40–1.50)
Calcium: 9.2 mg/dL (ref 8.4–10.5)
GFR: 35.38 mL/min — ABNORMAL LOW (ref >60.00)
Glucose, Bld: 98 mg/dL (ref 70–99)
POTASSIUM: 5 meq/L (ref 3.5–5.1)
Sodium: 138 mEq/L (ref 135–145)

## 2016-11-15 LAB — CBC WITH DIFFERENTIAL/PLATELET
BASOS ABS: 0 10*3/uL (ref 0.0–0.1)
BASOS PCT: 0.4 % (ref 0.0–3.0)
Eosinophils Absolute: 0 10*3/uL (ref 0.0–0.7)
Eosinophils Relative: 0 % (ref 0.0–5.0)
HEMATOCRIT: 38.1 % — AB (ref 39.0–52.0)
Hemoglobin: 12.8 g/dL — ABNORMAL LOW (ref 13.0–17.0)
Lymphocytes Relative: 29.5 % (ref 12.0–46.0)
Lymphs Abs: 2.2 10*3/uL (ref 0.7–4.0)
MCHC: 33.5 g/dL (ref 30.0–36.0)
MCV: 95.3 fl (ref 78.0–100.0)
MONOS PCT: 17.8 % — AB (ref 3.0–12.0)
Monocytes Absolute: 1.3 10*3/uL — ABNORMAL HIGH (ref 0.1–1.0)
NEUTROS ABS: 4 10*3/uL (ref 1.4–7.7)
Neutrophils Relative %: 52.3 % (ref 43.0–77.0)
PLATELETS: 109 10*3/uL — AB (ref 150.0–400.0)
RBC: 4 Mil/uL — ABNORMAL LOW (ref 4.22–5.81)
RDW: 14.1 % (ref 11.5–15.5)
WBC: 7.6 10*3/uL (ref 4.0–10.5)

## 2016-11-15 LAB — IBC PANEL
Iron: 47 ug/dL (ref 42–165)
SATURATION RATIOS: 15.8 % — AB (ref 20.0–50.0)
Transferrin: 212 mg/dL (ref 212.0–360.0)

## 2016-11-15 LAB — TSH: TSH: 4.42 u[IU]/mL (ref 0.35–4.50)

## 2016-11-15 LAB — POCT GLYCOSYLATED HEMOGLOBIN (HGB A1C): HEMOGLOBIN A1C: 5.8

## 2016-11-15 NOTE — Progress Notes (Signed)
Subjective:    Patient ID: [name], male    DOB: January 11, 1928, 83 y.o.   MRN: [id number]  HPI  The state of at least three ongoing medical problems is addressed today, with interval history of each noted here: DM: no weight change.   Hypothyroidism: he has slight leg edema.    Renal failure: he denies dysuria.   Past Medical History:  Diagnosis Date  . BRADYCARDIA 11/13/2008  . Cholelithiasis   . COLONIC POLYPS, HX OF 02/04/2007  . DEPRESSION 02/04/2007  . DIABETES MELLITUS, TYPE II 02/04/2007  . DIVERTICULOSIS, COLON 02/04/2007  . FOREIGN BODY, ASPIRATION 12/04/2007  . GERD (gastroesophageal reflux disease)   . GLAUCOMA 03/08/2009  . HYPERLIPIDEMIA 12/04/2007  . HYPERTENSION 02/04/2007  . OBSTRUCTIVE SLEEP APNEA 11/05/2009   -PSG 01/05/10 RDI 11, PLMI 56  . OSTEOARTHRITIS 02/04/2007  . Peptic stricture of esophagus   . PROSTATE CANCER 11/07/2007  . RENAL INSUFFICIENCY 11/13/2008  . SYNCOPE 12/31/2009  . THROMBOCYTOPENIA 04/17/2008  . THYROID NODULE, RIGHT 11/13/2008  . UNSPECIFIED ANEMIA 11/07/2007    Past Surgical History:  Procedure Laterality Date  . Heart Cartherization  2000  . PROSTATECTOMY  1997  . TONSILLECTOMY  1940's    Social History   Social History  . Marital status: Married    Spouse name: N/A  . Number of children: 3  . Years of education: N/A   Occupational History  . retired     Retired   Social History Main Topics  . Smoking status: Former Smoker    Types: Cigarettes, Pipe    Quit date: 07/08/1993  . Smokeless tobacco: Never Used  . Alcohol use 0.6 oz/week    1 Shots of liquor per week     Comment: 1 shot a day  . Drug use: No  . Sexual activity: Not on file   Other Topics Concern  . Not on file   Social History Narrative   He plays tennis 3x a week    Current Outpatient Prescriptions on File Prior to Visit  Medication Sig Dispense Refill  . aspirin 81 MG tablet Take 81 mg by mouth daily.    . Cholecalciferol (VITAMIN D) 2000 UNITS CAPS Take  1 capsule by mouth daily.      Marland Kitchen lisinopril (PRINIVIL,ZESTRIL) 5 MG tablet TAKE 1 TABLET BY MOUTH ONE TIME DAILY 90 tablet 0  . omeprazole (PRILOSEC) 40 MG capsule TAKE ONE CAPSULE BY MOUTH ONE TIME DAILY 30 capsule 0  . rosuvastatin (CRESTOR) 40 MG tablet Take 1 tablet (40 mg total) by mouth daily. 90 tablet 3  . traZODone (DESYREL) 100 MG tablet TAKE 1 TABLET BY MOUTH ATBEDTIME 30 tablet 0   No current facility-administered medications on file prior to visit.     Allergies  Allergen Reactions  . Lactose Intolerance (Gi)   . Sulfonamide Derivatives     REACTION: Itch/Red Spots    Family History  Problem Relation Age of Onset  . Kidney disease Father   . Heart disease Neg Hx        No FH of Coronary Artery Disease  . Cancer Neg Hx     BP 122/72 (BP Location: Left Arm, Patient Position: Sitting)   Pulse (!) 59   Wt 196 lb (88.9 kg)   SpO2 (!) 66%   BMI 26.58 kg/m   Review of Systems Denies chest pain and sob.     Objective:   Physical Exam VS: see vs page GEN: no distress HEAD:  head: no deformity eyes: no periorbital swelling, no proptosis external nose and ears are normal mouth: no lesion seen NECK: supple, thyroid is not enlarged CHEST WALL: no deformity LUNGS: clear to auscultation CV: reg rate and rhythm, no murmur ABD: abdomen is soft, nontender.  no hepatosplenomegaly.  not distended.  no hernia MUSCULOSKELETAL: muscle bulk and strength are grossly normal.  no obvious joint swelling.  gait is normal and steady EXTEMITIES: no deformity.  no ulcer on the feet.  feet are of normal color and temp.  1+ bilat leg edema PULSES: dorsalis pedis intact bilat.  no carotid bruit NEURO:  cn 2-12 grossly intact.   readily moves all 4's.  sensation is intact to touch on the feet SKIN:  Normal texture and temperature.  No rash or suspicious lesion is visible.   NODES:  None palpable at the neck PSYCH: alert, well-oriented.  Does not appear anxious nor depressed.    Lab  Results  Component Value Date   HGBA1C 5.8 11/15/2016   Lab Results  Component Value Date   CREATININE 1.92 (H) 11/15/2016   BUN 34 (H) 11/15/2016   NA 138 11/15/2016   K 5.0 11/15/2016   CL 105 11/15/2016   CO2 28 11/15/2016   Lab Results  Component Value Date   WBC 7.6 11/15/2016   HGB 12.8 (L) 11/15/2016   HCT 38.1 (L) 11/15/2016   MCV 95.3 11/15/2016   PLT 109.0 (L) 11/15/2016   Lab Results  Component Value Date   TSH 4.42 11/15/2016      Assessment & Plan:  DM: well-controlled off medication.  We'll follow.   Hypothyroidism: no medication is needed now.  We'll follow.   Renal failure: slightly worse.  We'll follow.  Ok to continue to take low-dose ACEI.   Anemia: new, mild: I advised fe tabs 1/day.   Patient Instructions  blood tests are requested for you today.  We'll let you know about the results. Please continue the same medications  Please come back for a follow-up appointment in 6 months.

## 2016-11-15 NOTE — Patient Instructions (Signed)
blood tests are requested for you today.  We'll let you know about the results. Please continue the same medications  Please come back for a follow-up appointment in 6 months.

## 2016-12-01 ENCOUNTER — Telehealth: Payer: Self-pay | Admitting: Endocrinology

## 2016-12-01 NOTE — Telephone Encounter (Signed)
Would this be okay? You are PCP?

## 2016-12-01 NOTE — Telephone Encounter (Signed)
Please call patient to verify whether he can get tetanus shot at your office. He's a pt of Dr. Loanne Drilling.  Thank you,  -LL

## 2016-12-01 NOTE — Telephone Encounter (Signed)
Ok, but I don't know if medicare pays.

## 2016-12-02 ENCOUNTER — Ambulatory Visit (INDEPENDENT_AMBULATORY_CARE_PROVIDER_SITE_OTHER): Payer: Medicare Other

## 2016-12-02 DIAGNOSIS — Z23 Encounter for immunization: Secondary | ICD-10-CM

## 2016-12-02 NOTE — Telephone Encounter (Signed)
Patient called back to check the status of getting a tetanus shot. I asked Almyra Free about this and she advised that I can schedule the patient on the nurse schedule. Patient is schedule for today at 3:30pm

## 2016-12-08 ENCOUNTER — Other Ambulatory Visit: Payer: Self-pay | Admitting: Endocrinology

## 2017-01-07 ENCOUNTER — Other Ambulatory Visit: Payer: Self-pay | Admitting: Endocrinology

## 2017-01-07 ENCOUNTER — Telehealth: Payer: Self-pay | Admitting: Internal Medicine

## 2017-01-07 NOTE — Telephone Encounter (Signed)
Left message for pt to call back  °

## 2017-01-07 NOTE — Telephone Encounter (Signed)
Discussed with pt that he can try taking miralax otc for constipation until his OV.

## 2017-02-03 ENCOUNTER — Other Ambulatory Visit: Payer: Self-pay | Admitting: Endocrinology

## 2017-02-08 ENCOUNTER — Other Ambulatory Visit: Payer: Self-pay | Admitting: Endocrinology

## 2017-02-09 ENCOUNTER — Other Ambulatory Visit: Payer: Self-pay | Admitting: Endocrinology

## 2017-02-09 ENCOUNTER — Ambulatory Visit: Payer: Medicare Other | Admitting: Internal Medicine

## 2017-02-20 ENCOUNTER — Ambulatory Visit (HOSPITAL_COMMUNITY)
Admission: EM | Admit: 2017-02-20 | Discharge: 2017-02-20 | Disposition: A | Discharge status: Home / Self Care | Payer: Medicare Other | Attending: Internal Medicine | Admitting: Internal Medicine

## 2017-02-20 ENCOUNTER — Encounter (HOSPITAL_COMMUNITY): Payer: Self-pay | Admitting: Emergency Medicine

## 2017-02-20 ENCOUNTER — Telehealth: Payer: Self-pay | Admitting: Gastroenterology

## 2017-02-20 DIAGNOSIS — R300 Dysuria: Secondary | ICD-10-CM | POA: Diagnosis not present

## 2017-02-20 DIAGNOSIS — N3090 Cystitis, unspecified without hematuria: Secondary | ICD-10-CM

## 2017-02-20 LAB — POCT URINALYSIS DIP (DEVICE)
Bilirubin Urine: NEGATIVE
Glucose, UA: NEGATIVE mg/dL
Ketones, ur: NEGATIVE mg/dL
NITRITE: NEGATIVE
PH: 5 (ref 5.0–8.0)
Protein, ur: >=300 mg/dL — AB
SPECIFIC GRAVITY, URINE: 1.02 (ref 1.005–1.030)
UROBILINOGEN UA: 0.2 mg/dL (ref 0.0–1.0)

## 2017-02-20 MED ORDER — CEPHALEXIN 500 MG PO CAPS: 500.0000 mg | ORAL_CAPSULE | Freq: Two times a day (BID) | ORAL | 0 refills | Status: DC

## 2017-02-20 NOTE — ED Triage Notes (Signed)
The patient presented to the [facility] with a complaint of hematuria and dysuria that started this am.

## 2017-02-20 NOTE — Discharge Instructions (Signed)
Keflex as directed. Keep hydrated, your urine should be clear to pale yellow in color. Follow-up with PCP if symptoms do not improve for further evaluation and treatment. If experiencing abdominal pain, fever, nausea, vomiting, flank pain, follow up at the emergency department for further evaluation.

## 2017-02-20 NOTE — ED Provider Notes (Signed)
[place]    CSN: [id number] Arrival date & time: 02/20/17  1910     History   Chief Complaint Chief Complaint  Patient presents with  . Hematuria    HPI [name] is a 83 y.o. male.   83 year old male with history of type 2 diabetes, hypertension, hyperlipidemia, chronic kidney disease, prostate cancer comes in for 1 day history of hematuria and dysuria.  Wife states had a fever of 100.9 earlier today, has not taken anything for it. Patient states he had prostate removal for prostate cancer many years ago, and has not needed to follow-up since. He denies chills, night sweats. Denies abdominal pain, nausea, vomiting. He does endorse constipation with lots of straining. He states his diabetes is well controlled, last A1c 5.8.      Past Medical History:  Diagnosis Date  . BRADYCARDIA 11/13/2008  . Cholelithiasis   . COLONIC POLYPS, HX OF 02/04/2007  . DEPRESSION 02/04/2007  . DIABETES MELLITUS, TYPE II 02/04/2007  . DIVERTICULOSIS, COLON 02/04/2007  . FOREIGN BODY, ASPIRATION 12/04/2007  . GERD (gastroesophageal reflux disease)   . GLAUCOMA 03/08/2009  . HYPERLIPIDEMIA 12/04/2007  . HYPERTENSION 02/04/2007  . OBSTRUCTIVE SLEEP APNEA 11/05/2009   -PSG 01/05/10 RDI 11, PLMI 56  . OSTEOARTHRITIS 02/04/2007  . Peptic stricture of esophagus   . PROSTATE CANCER 11/07/2007  . RENAL INSUFFICIENCY 11/13/2008  . SYNCOPE 12/31/2009  . THROMBOCYTOPENIA 04/17/2008  . THYROID NODULE, RIGHT 11/13/2008  . UNSPECIFIED ANEMIA 11/07/2007    Patient Active Problem List   Diagnosis Date Noted  . Hypothyroidism 12/10/2015  . Cervical neck pain with evidence of disc disease 11/12/2015  . Tendinopathy of left rotator cuff 04/16/2015  . Hamstring muscle strain 04/16/2015  . Renal insufficiency 03/10/2015  . Abdominal pain, unspecified site 03/12/2014  . Diabetes mellitus with renal manifestation ([place]) 11/17/2012  . Bicipital tendinitis of right shoulder 11/08/2012  . Paresthesia 04/04/2012   . Heel pain 11/30/2011  . Insomnia 01/20/2011  . Routine general medical examination at a health care facility 11/07/2010  . Encounter for long-term (current) use of other medications 10/15/2010  . EDEMA 04/08/2010  . OSTEOARTHRITIS, HIP 03/05/2010  . HIP PAIN, RIGHT 03/05/2010  . UNEQUAL LEG LENGTH 03/05/2010  . LEG CRAMPS 02/17/2010  . SYNCOPE 12/31/2009  . CAROTID BRUIT, LEFT 12/31/2009  . OBSTRUCTIVE SLEEP APNEA 11/05/2009  . GLAUCOMA 03/08/2009  . THYROID NODULE, RIGHT 11/13/2008  . BRADYCARDIA 11/13/2008  . Disorder resulting from impaired renal function 11/13/2008  . Thrombocytopenia ([place]) 04/17/2008  . Dyslipidemia 12/04/2007  . FOREIGN BODY, ASPIRATION 12/04/2007  . PROSTATE CANCER 11/07/2007  . Anemia 11/07/2007  . DEPRESSION 02/04/2007  . HTN (hypertension) 02/04/2007  . DIVERTICULOSIS, COLON 02/04/2007  . OSTEOARTHRITIS 02/04/2007  . COLONIC POLYPS, HX OF 02/04/2007    Past Surgical History:  Procedure Laterality Date  . Heart Cartherization  2000  . PROSTATECTOMY  1997  . TONSILLECTOMY  1940's       Home Medications    Prior to Admission medications   Medication Sig Start Date End Date Taking? Authorizing Provider  aspirin 81 MG tablet Take 81 mg by mouth daily.    [provider]  cephALEXin (KEFLEX) 500 MG capsule Take 1 capsule (500 mg total) by mouth 2 (two) times daily. 02/20/17 02/27/17  Ok Edwards, PA-C  Cholecalciferol (VITAMIN D) 2000 UNITS CAPS Take 1 capsule by mouth daily.      [provider]  lisinopril (PRINIVIL,ZESTRIL) 5 MG tablet TAKE 1  TABLET BY MOUTH ONE TIME DAILY 10/08/16   Renato Shin, MD  omeprazole (PRILOSEC) 40 MG capsule TAKE ONE CAPSULE BY MOUTH ONE TIME DAILY  02/03/17   Renato Shin, MD  rosuvastatin (CRESTOR) 40 MG tablet Take 1 tablet (40 mg total) by mouth daily. 08/16/16   Renato Shin, MD  traZODone (DESYREL) 100 MG tablet TAKE ONE TABLET BY MOUTH AT BEDTIME  02/09/17   Renato Shin, MD    Family  History Family History  Problem Relation Age of Onset  . Kidney disease Father   . Heart disease Neg Hx        No FH of Coronary Artery Disease  . Cancer Neg Hx     Social History Social History  Substance Use Topics  . Smoking status: Former Smoker    Types: Cigarettes, Pipe    Quit date: 07/08/1993  . Smokeless tobacco: Never Used  . Alcohol use 0.6 oz/week    1 Shots of liquor per week     Comment: 1 shot a day     Allergies   Lactose intolerance (gi) and Sulfonamide derivatives   Review of Systems Review of Systems  Reason unable to perform ROS: See HPI as above.     Physical Exam Triage Vital Signs ED Triage Vitals  Enc Vitals Group     BP 02/20/17 1939 (!) 138/49     Pulse Rate 02/20/17 1939 72     Resp 02/20/17 1939 18     Temp 02/20/17 1939 98.8 F (37.1 C)     Temp Source 02/20/17 1939 Oral     SpO2 02/20/17 1939 99 %     Weight --      Height --      Head Circumference --      Peak Flow --      Pain Score 02/20/17 1936 1     Pain Loc --      Pain Edu? --      Excl. in [place]? --    No data found.   Updated Vital Signs BP (!) 138/49 (BP Location: Right Arm)   Pulse 72   Temp 98.8 F (37.1 C) (Oral)   Resp 18   SpO2 99%   Visual Acuity Right Eye Distance:   Left Eye Distance:   Bilateral Distance:    Right Eye Near:   Left Eye Near:    Bilateral Near:     Physical Exam  Constitutional: He is oriented to person, place, and time. He appears well-developed and well-nourished. No distress.  HENT:  Head: Normocephalic and atraumatic.  Cardiovascular: Normal rate, regular rhythm and normal heart sounds.  Exam reveals no gallop and no friction rub.   No murmur heard. Pulmonary/Chest: Effort normal and breath sounds normal. He has no wheezes. He has no rales.  Abdominal: Soft. There is no CVA tenderness.  Neurological: He is alert and oriented to person, place, and time.  Skin: Skin is warm and dry.  Psychiatric: He has a normal mood and  affect. His behavior is normal. Judgment normal.     UC Treatments / Results  Labs (all labs ordered are listed, but only abnormal results are displayed) Labs Reviewed  POCT URINALYSIS DIP (DEVICE) - Abnormal; Notable for the following:       Result Value   Hgb urine dipstick LARGE (*)    Protein, ur >=300 (*)    Leukocytes, UA SMALL (*)    All other components within normal limits  EKG  EKG Interpretation None       Radiology No results found.  Procedures Procedures (including critical care time)  Medications Ordered in UC Medications - No data to display   Initial Impression / Assessment and Plan / UC Course  I have reviewed the triage vital signs and the nursing notes.  Pertinent labs & imaging results that were available during my care of the patient were reviewed by me and considered in my medical decision making (see chart for details).    Urine positive for UTI. Start Keflex as directed. Push fluids. Follow up with PCP for further evaluation if symptoms do not resolve. Return precautions given.  Final Clinical Impressions(s) / UC Diagnoses   Final diagnoses:  Cystitis    New Prescriptions Discharge Medication List as of 02/20/2017  8:29 PM    START taking these medications   Details  cephALEXin (KEFLEX) 500 MG capsule Take 1 capsule (500 mg total) by mouth 2 (two) times daily., Starting Sun 02/20/2017, Until Sun 02/27/2017, Normal          Yu, Amy V, PA-C 02/20/17 2123

## 2017-02-20 NOTE — Telephone Encounter (Signed)
His wife called.  He's had blood in his urine several times recently.  I recommended that she call his PCP.

## 2017-02-21 ENCOUNTER — Telehealth: Payer: Self-pay | Admitting: *Deleted

## 2017-02-21 ENCOUNTER — Telehealth: Payer: Self-pay | Admitting: Endocrinology

## 2017-02-21 NOTE — Telephone Encounter (Signed)
Patient needs a hospital f/u on Friday per the hospital. I scheduled the patient on Thursday however, the patient is requesting a call from a nurse to see if he can come in on Friday. Call patient to advise, he is awaiting a call.

## 2017-02-21 NOTE — Telephone Encounter (Signed)
Patient checked into ED on 02/20/2017.  --------------------------------------------------------------------- PLEASE NOTE: All timestamps contained within this report are represented as [place] Standard Time. CONFIDENTIALTY NOTICE: This fax transmission is intended only for the addressee. It contains information that is legally privileged, confidential or otherwise protected from use or disclosure. If you are not the intended recipient, you are strictly prohibited from reviewing, disclosing, copying using or disseminating any of this information or taking any action in reliance on or regarding this information. If you have received this fax in error, please notify us immediately by telephone so that we can arrange for its return to [place]. Phone: [phone], Toll-Free: [phone], Fax: [phone] Page: 1 of 1 Call Id: [id number] [facility] [place] Patient Name: [name] Gender: Male DOB: 10/06/1927 Age: 85 Y 30 M 2 D Return Phone Number: [phone] (Primary) City/State/Zip:  Client [place] [facility] [place] Client Site [place] [facility] [place] Night Who Is Calling Patient / Member / Family / Caregiver Call Type Triage / Clinical Caller Name [name] Relationship To Patient Spouse Return Phone Number [phone] (Primary) Chief Complaint Fever (non urgent symptom) (> THREE MONTHS) Reason for Call Symptomatic / Request for [place] said she spoke with a nurse earlier, but now her husband has a 100.9 temp. Nurse Assessment Nurse: Junius Creamer, RN, [place] Date/Time (Eastern Time): 02/20/2017 6:40:47 PM Confirm and document reason for call. If symptomatic, describe symptoms. ---Caller states she spoke with a nurse earlier, but now her husband has a 100.9 temp. also has blood in urine, burning w/ void. denies back pain. started  today. Does the PT have any chronic conditions? (i.e. diabetes, asthma, etc.) ---Yes List chronic conditions. ---prediabetic Guidelines Guideline Title Affirmed Question Urine - Blood In Fever > 100.5 F (38.1 C) Disp. Time Eilene Ghazi Time) Disposition Final User 02/20/2017 6:50:42 PM Go to ED Now (or PCP triage) Yes Junius Creamer, RN, [facility] [place] Advice Given Per Guideline GO TO ED NOW (OR PCP TRIAGE): * IF NO PCP TRIAGE: You need to be seen. Go to the [facility] Hospital within the next hour. Leave as soon as you can. DRIVING: Another adult should drive. BRING MEDICINES: * Please bring a list of your current medicines when you go to the Emergency Department (ER). CARE ADVICE given per Urine, Blood In (Adult) guideline.

## 2017-02-22 NOTE — Telephone Encounter (Signed)
Called patient and he agreed to 9am appointment.

## 2017-02-22 NOTE — Telephone Encounter (Signed)
Routing to you °

## 2017-02-22 NOTE — Telephone Encounter (Signed)
9 AM friday

## 2017-02-23 NOTE — Telephone Encounter (Signed)
Please advise, not sure if you seen this from ED visit yet. They sent it to [facility] and she is not here.  Thanks!

## 2017-02-24 ENCOUNTER — Ambulatory Visit: Payer: Medicare Other | Admitting: Endocrinology

## 2017-02-25 ENCOUNTER — Ambulatory Visit (INDEPENDENT_AMBULATORY_CARE_PROVIDER_SITE_OTHER): Payer: Medicare Other | Admitting: Endocrinology

## 2017-02-25 ENCOUNTER — Encounter: Payer: Self-pay | Admitting: Endocrinology

## 2017-02-25 VITALS — BP 118/72 | HR 56 | Wt 198.6 lb

## 2017-02-25 DIAGNOSIS — N390 Urinary tract infection, site not specified: Secondary | ICD-10-CM | POA: Insufficient documentation

## 2017-02-25 DIAGNOSIS — R319 Hematuria, unspecified: Secondary | ICD-10-CM

## 2017-02-25 DIAGNOSIS — E1122 Type 2 diabetes mellitus with diabetic chronic kidney disease: Secondary | ICD-10-CM

## 2017-02-25 DIAGNOSIS — N183 Chronic kidney disease, stage 3 (moderate): Secondary | ICD-10-CM | POA: Diagnosis not present

## 2017-02-25 LAB — URINALYSIS, ROUTINE W REFLEX MICROSCOPIC
BILIRUBIN URINE: NEGATIVE
HGB URINE DIPSTICK: NEGATIVE
KETONES UR: NEGATIVE
LEUKOCYTES UA: NEGATIVE
Nitrite: NEGATIVE
RBC / HPF: NONE SEEN (ref 0–?)
Specific Gravity, Urine: 1.01 (ref 1.000–1.030)
Total Protein, Urine: 30 — AB
UROBILINOGEN UA: 0.2 (ref 0.0–1.0)
Urine Glucose: NEGATIVE
WBC, UA: NONE SEEN (ref 0–?)
pH: 5.5 (ref 5.0–8.0)

## 2017-02-25 LAB — POCT GLYCOSYLATED HEMOGLOBIN (HGB A1C): Hemoglobin A1C: 6

## 2017-02-25 NOTE — Progress Notes (Signed)
Subjective:    Patient ID: [name], male    DOB: 1927-08-14, 83 y.o.   MRN: [id number]  HPI Pt returns for f/u of diabetes mellitus:  DM type: 2 Dx'ed: 8850 Complications: renal insuff Therapy: none DKA: never Severe hypoglycemia: never Pancreatitis: never Pancreatic imaging: never Other: he has never taken insulin Interval history:  He was seen in ER for UTI, and rx'ed keflex.  He feels better now.  Past Medical History:  Diagnosis Date  . BRADYCARDIA 11/13/2008  . Cholelithiasis   . COLONIC POLYPS, HX OF 02/04/2007  . DEPRESSION 02/04/2007  . DIABETES MELLITUS, TYPE II 02/04/2007  . DIVERTICULOSIS, COLON 02/04/2007  . FOREIGN BODY, ASPIRATION 12/04/2007  . GERD (gastroesophageal reflux disease)   . GLAUCOMA 03/08/2009  . HYPERLIPIDEMIA 12/04/2007  . HYPERTENSION 02/04/2007  . OBSTRUCTIVE SLEEP APNEA 11/05/2009   -PSG 01/05/10 RDI 11, PLMI 56  . OSTEOARTHRITIS 02/04/2007  . Peptic stricture of esophagus   . PROSTATE CANCER 11/07/2007  . RENAL INSUFFICIENCY 11/13/2008  . SYNCOPE 12/31/2009  . THROMBOCYTOPENIA 04/17/2008  . THYROID NODULE, RIGHT 11/13/2008  . UNSPECIFIED ANEMIA 11/07/2007    Past Surgical History:  Procedure Laterality Date  . Heart Cartherization  2000  . PROSTATECTOMY  1997  . TONSILLECTOMY  1940's    Social History   Social History  . Marital status: Married    Spouse name: N/A  . Number of children: 3  . Years of education: N/A   Occupational History  . retired     Retired   Social History Main Topics  . Smoking status: Former Smoker    Types: Cigarettes, Pipe    Quit date: 07/08/1993  . Smokeless tobacco: Never Used  . Alcohol use 0.6 oz/week    1 Shots of liquor per week     Comment: 1 shot a day  . Drug use: No  . Sexual activity: Not on file   Other Topics Concern  . Not on file   Social History Narrative   He plays tennis 3x a week    Current Outpatient Prescriptions on File Prior to Visit  Medication Sig Dispense Refill  .  aspirin 81 MG tablet Take 81 mg by mouth daily.    . Cholecalciferol (VITAMIN D) 2000 UNITS CAPS Take 1 capsule by mouth daily.      Marland Kitchen lisinopril (PRINIVIL,ZESTRIL) 5 MG tablet TAKE 1 TABLET BY MOUTH ONE TIME DAILY 90 tablet 0  . omeprazole (PRILOSEC) 40 MG capsule TAKE ONE CAPSULE BY MOUTH ONE TIME DAILY  30 capsule 0  . rosuvastatin (CRESTOR) 40 MG tablet Take 1 tablet (40 mg total) by mouth daily. 90 tablet 3  . traZODone (DESYREL) 100 MG tablet TAKE ONE TABLET BY MOUTH AT BEDTIME  30 tablet 0   No current facility-administered medications on file prior to visit.     Allergies  Allergen Reactions  . Lactose Intolerance (Gi)   . Sulfonamide Derivatives     REACTION: Itch/Red Spots    Family History  Problem Relation Age of Onset  . Kidney disease Father   . Heart disease Neg Hx        No FH of Coronary Artery Disease  . Cancer Neg Hx     BP 118/72   Pulse (!) 56   Wt 198 lb 9.6 oz (90.1 kg)   SpO2 96%   BMI 26.94 kg/m   Review of Systems Denies any more fever.  Hematuria is also resolved.     Objective:  Physical Exam VITAL SIGNS:  See vs page GENERAL: no distress Pulses: foot pulses are intact bilaterally.   MSK: no deformity of the feet or ankles.  CV: 1+ bilat edema of the legs or ankles Skin:  no ulcer on the feet or ankles.  normal color and temp on the feet and ankles Neuro: sensation is intact to touch on the feet and ankles.     Lab Results  Component Value Date   HGBA1C 6.0 02/25/2017      Assessment & Plan:  UTI: new.  recheck today DM: no medication is needed now.   Patient Instructions  A urine test is requested for you today.  We'll let you know about the results.   No medication is needed for the blood sugar.   Please come back for a follow-up appointment in 2 months.

## 2017-02-25 NOTE — Patient Instructions (Addendum)
A urine test is requested for you today.  We'll let you know about the results.   No medication is needed for the blood sugar.   Please come back for a follow-up appointment in 2 months.

## 2017-03-13 ENCOUNTER — Other Ambulatory Visit: Payer: Self-pay | Admitting: Endocrinology

## 2017-03-14 ENCOUNTER — Other Ambulatory Visit: Payer: Self-pay | Admitting: Endocrinology

## 2017-03-14 DIAGNOSIS — D2262 Melanocytic nevi of left upper limb, including shoulder: Secondary | ICD-10-CM | POA: Diagnosis not present

## 2017-03-14 DIAGNOSIS — Z85828 Personal history of other malignant neoplasm of skin: Secondary | ICD-10-CM | POA: Diagnosis not present

## 2017-03-14 DIAGNOSIS — L570 Actinic keratosis: Secondary | ICD-10-CM | POA: Diagnosis not present

## 2017-03-14 DIAGNOSIS — L821 Other seborrheic keratosis: Secondary | ICD-10-CM | POA: Diagnosis not present

## 2017-03-14 DIAGNOSIS — D225 Melanocytic nevi of trunk: Secondary | ICD-10-CM | POA: Diagnosis not present

## 2017-03-14 DIAGNOSIS — D692 Other nonthrombocytopenic purpura: Secondary | ICD-10-CM | POA: Diagnosis not present

## 2017-03-23 ENCOUNTER — Other Ambulatory Visit: Payer: Self-pay

## 2017-03-23 ENCOUNTER — Telehealth: Payer: Self-pay | Admitting: Endocrinology

## 2017-03-23 MED ORDER — TRAZODONE HCL 100 MG PO TABS: 100.0000 mg | ORAL_TABLET | Freq: Every day | ORAL | 0 refills | Status: DC

## 2017-03-23 NOTE — Telephone Encounter (Signed)
MEDICATION: traZODone (DESYREL) 100 MG tablet  PHARMACY:   [id number] - [place], [place] [phone] (Phone) [phone] (Fax)    IS THIS A 90 DAY SUPPLY : N/A  IS PATIENT OUT OF MEDICATION: N/A  IF NOT; HOW MUCH IS LEFT:   LAST APPOINTMENT DATE: 02/25/17  NEXT APPOINTMENT DATE: 04/06/17  OTHER COMMENTS:    **Let patient know to contact pharmacy at the end of the day to make sure medication is ready. **  ** Please notify patient to allow 48-72 hours to process**  **Encourage patient to contact the pharmacy for refills or they can request refills through [facility]**

## 2017-03-24 ENCOUNTER — Encounter (INDEPENDENT_AMBULATORY_CARE_PROVIDER_SITE_OTHER): Payer: Self-pay

## 2017-03-24 ENCOUNTER — Encounter: Payer: Self-pay | Admitting: Cardiovascular Disease

## 2017-03-24 ENCOUNTER — Ambulatory Visit (INDEPENDENT_AMBULATORY_CARE_PROVIDER_SITE_OTHER): Payer: Medicare Other | Admitting: Cardiovascular Disease

## 2017-03-24 VITALS — BP 120/72 | HR 49 | Ht 72.0 in | Wt 198.0 lb

## 2017-03-24 DIAGNOSIS — I10 Essential (primary) hypertension: Secondary | ICD-10-CM

## 2017-03-24 DIAGNOSIS — E785 Hyperlipidemia, unspecified: Secondary | ICD-10-CM | POA: Diagnosis not present

## 2017-03-24 NOTE — Patient Instructions (Signed)

## 2017-03-24 NOTE — Progress Notes (Signed)
Cardiology Office Note Date:  03/26/2017   ID:  Romar, Woodrick [birth year], MRN [id number]  PCP:  Renato Shin, MD  Cardiologist:  Sherren Mocha, MD    Chief Complaint  Patient presents with  . Hypertension     History of Present Illness: [name] is a 83 y.o. male who presents for follow-up evaluation. The patient was last seen in October 2017. He's been followed for hypertension and remote history of syncope. Other problems include chronic kidney disease and hypercholesterolemia.   He is here alone today. Plans to return to [place] next month, then will return in May 2019. He plans to sell his place in [place] after they leave. He has been feeling well. Today, he denies symptoms of palpitations, chest pain, shortness of breath, orthopnea, PND, lower extremity edema, dizziness, or syncope.   Past Medical History:  Diagnosis Date  . BRADYCARDIA 11/13/2008  . Cholelithiasis   . COLONIC POLYPS, HX OF 02/04/2007  . DEPRESSION 02/04/2007  . DIABETES MELLITUS, TYPE II 02/04/2007  . DIVERTICULOSIS, COLON 02/04/2007  . FOREIGN BODY, ASPIRATION 12/04/2007  . GERD (gastroesophageal reflux disease)   . GLAUCOMA 03/08/2009  . HYPERLIPIDEMIA 12/04/2007  . HYPERTENSION 02/04/2007  . OBSTRUCTIVE SLEEP APNEA 11/05/2009   -PSG 01/05/10 RDI 11, PLMI 56  . OSTEOARTHRITIS 02/04/2007  . Peptic stricture of esophagus   . PROSTATE CANCER 11/07/2007  . RENAL INSUFFICIENCY 11/13/2008  . SYNCOPE 12/31/2009  . THROMBOCYTOPENIA 04/17/2008  . THYROID NODULE, RIGHT 11/13/2008  . UNSPECIFIED ANEMIA 11/07/2007    Past Surgical History:  Procedure Laterality Date  . Heart Cartherization  2000  . PROSTATECTOMY  1997  . TONSILLECTOMY  1940's    Current Outpatient Prescriptions  Medication Sig Dispense Refill  . aspirin 81 MG tablet Take 81 mg by mouth daily.    . Cholecalciferol (VITAMIN D) 2000 UNITS CAPS Take 1 capsule by mouth daily.      Marland Kitchen lisinopril (PRINIVIL,ZESTRIL) 5 MG tablet TAKE 1 TABLET BY  MOUTH ONE TIME DAILY 90 tablet 0  . omeprazole (PRILOSEC) 40 MG capsule TAKE ONE CAPSULE BY MOUTH ONE TIME DAILY  30 capsule 0  . rosuvastatin (CRESTOR) 40 MG tablet Take 1 tablet (40 mg total) by mouth daily. 90 tablet 3  . traZODone (DESYREL) 100 MG tablet Take 1 tablet (100 mg total) by mouth at bedtime. 30 tablet 0   No current facility-administered medications for this visit.     Allergies:   Lactose intolerance (gi) and Sulfonamide derivatives   Social History:  The patient  reports that he quit smoking about 23 years ago. His smoking use included Cigarettes and Pipe. He has never used smokeless tobacco. He reports that he drinks about 0.6 oz of alcohol per week . He reports that he does not use drugs.   Family History:  The patient's family history includes Kidney disease in his father.    ROS:  Please see the history of present illness.  Otherwise, review of systems is positive for poor balance.  All other systems are reviewed and negative.    PHYSICAL EXAM: VS:  BP 120/72   Pulse (!) 49   Ht 6' (1.829 m)   Wt 89.8 kg (198 lb)   SpO2 95%   BMI 26.85 kg/m  , BMI Body mass index is 26.85 kg/m. GEN: Well nourished, well developed, pleasant elderly male  in no acute distress  HEENT: normal  Neck: no JVD, no masses. No carotid bruits Cardiac: bradycardic and regular  without murmur or gallop                Respiratory:  clear to auscultation bilaterally, normal work of breathing GI: soft, nontender, nondistended, + BS MS: no deformity or atrophy  Ext: no pretibial edema, pedal pulses 2+= bilaterally Skin: warm and dry, no rash Neuro:  Strength and sensation are intact Psych: euthymic mood, full affect  EKG:  EKG is ordered today. The ekg ordered today shows sinus brady 49 bpm, 1st degree AV block  Recent Labs: 03/31/2016: ALT 17 11/15/2016: BUN 34; Creatinine, Ser 1.92; Hemoglobin 12.8; Platelets 109.0; Potassium 5.0; Sodium 138; TSH 4.42   Lipid Panel     Component  Value Date/Time   CHOL 116 03/31/2016 1056   TRIG 123.0 03/31/2016 1056   TRIG 48 04/05/2006 0750   HDL 36.50 (L) 03/31/2016 1056   CHOLHDL 3 03/31/2016 1056   VLDL 24.6 03/31/2016 1056   LDLCALC 55 03/31/2016 1056      Wt Readings from Last 3 Encounters:  03/24/17 89.8 kg (198 lb)  02/25/17 90.1 kg (198 lb 9.6 oz)  11/15/16 88.9 kg (196 lb)    ASSESSMENT AND PLAN: 1.  HTN with Stage 3 CKD: BP well-controlled. Last creatinine 11/15/2016 is 1.92 mg/dL, previous 1.66 in 2017.  Labs followed by Dr Loanne Drilling.  2. Hyperlipidemia: treated with a statin drug. Appears to be tolerating well without problems. LDL 55 mg/dL.   Current medicines are reviewed with the patient today.  The patient does not have concerns regarding medicines.  Labs/ tests ordered today include:   Orders Placed This Encounter  Procedures  . EKG 12-Lead   Disposition:   FU one year  Signed, Sherren Mocha, MD  03/26/2017 3:35 AM    [facility] [place], [address], [place]  [address] Phone: [phone]; Fax: [phone]

## 2017-03-30 ENCOUNTER — Ambulatory Visit: Payer: Medicare Other | Admitting: Cardiovascular Disease

## 2017-04-06 ENCOUNTER — Ambulatory Visit (INDEPENDENT_AMBULATORY_CARE_PROVIDER_SITE_OTHER): Payer: Medicare Other | Admitting: Endocrinology

## 2017-04-06 ENCOUNTER — Encounter: Payer: Self-pay | Admitting: Endocrinology

## 2017-04-06 VITALS — BP 138/66 | HR 54 | Wt 199.2 lb

## 2017-04-06 DIAGNOSIS — Z0000 Encounter for general adult medical examination without abnormal findings: Secondary | ICD-10-CM | POA: Diagnosis not present

## 2017-04-06 DIAGNOSIS — N289 Disorder of kidney and ureter, unspecified: Secondary | ICD-10-CM

## 2017-04-06 DIAGNOSIS — D509 Iron deficiency anemia, unspecified: Secondary | ICD-10-CM | POA: Diagnosis not present

## 2017-04-06 DIAGNOSIS — Z23 Encounter for immunization: Secondary | ICD-10-CM | POA: Diagnosis not present

## 2017-04-06 LAB — CBC WITH DIFFERENTIAL/PLATELET
BASOS PCT: 0.2 % (ref 0.0–3.0)
Basophils Absolute: 0 10*3/uL (ref 0.0–0.1)
EOS PCT: 0 % (ref 0.0–5.0)
Eosinophils Absolute: 0 10*3/uL (ref 0.0–0.7)
HEMATOCRIT: 39.8 % (ref 39.0–52.0)
HEMOGLOBIN: 13.3 g/dL (ref 13.0–17.0)
LYMPHS PCT: 31.3 % (ref 12.0–46.0)
Lymphs Abs: 2.7 10*3/uL (ref 0.7–4.0)
MCHC: 33.4 g/dL (ref 30.0–36.0)
MCV: 96.3 fl (ref 78.0–100.0)
Monocytes Absolute: 1 10*3/uL (ref 0.1–1.0)
Monocytes Relative: 12 % (ref 3.0–12.0)
NEUTROS ABS: 4.8 10*3/uL (ref 1.4–7.7)
Neutrophils Relative %: 56.5 % (ref 43.0–77.0)
Platelets: 106 10*3/uL — ABNORMAL LOW (ref 150.0–400.0)
RBC: 4.13 Mil/uL — ABNORMAL LOW (ref 4.22–5.81)
RDW: 13.9 % (ref 11.5–15.5)
WBC: 8.5 10*3/uL (ref 4.0–10.5)

## 2017-04-06 LAB — IBC PANEL
Iron: 124 ug/dL (ref 42–165)
Saturation Ratios: 40.4 % (ref 20.0–50.0)
Transferrin: 219 mg/dL (ref 212.0–360.0)

## 2017-04-06 LAB — BASIC METABOLIC PANEL
BUN: 38 mg/dL — AB (ref 6–23)
CHLORIDE: 107 meq/L (ref 96–112)
CO2: 27 mEq/L (ref 19–32)
CREATININE: 1.84 mg/dL — AB (ref 0.40–1.50)
Calcium: 9.5 mg/dL (ref 8.4–10.5)
GFR: 37.12 mL/min — AB (ref >60.00)
GLUCOSE: 105 mg/dL — AB (ref 70–99)
POTASSIUM: 4.8 meq/L (ref 3.5–5.1)
Sodium: 138 mEq/L (ref 135–145)

## 2017-04-06 NOTE — Patient Instructions (Signed)
Please consider these measures for your health:  minimize alcohol.  Do not use tobacco products.  Have a colonoscopy at least every 10 years from age 81.  Women should have an annual mammogram from age 40.  Keep firearms safely stored.  Always use seat belts.  have working smoke alarms in your home.  See an eye doctor and dentist regularly.  Never drive under the influence of alcohol or drugs (including prescription drugs).  Those with fair skin should take precautions against the sun, and should carefully examine their skin once per month, for any new or changed moles. It is critically important to prevent falling down (keep floor areas well-lit, dry, and free of loose objects.  If you have a cane, walker, or wheelchair, you should use it, even for short trips around the house.  Wear flat-soled shoes.  Also, try not to rush).  blood tests are requested for you today.  We'll let you know about the results.  Please come back for a follow-up appointment in 6 months.   

## 2017-04-06 NOTE — Progress Notes (Signed)
we discussed code status.  pt requests full code, but would not want to be started or maintained on artificial life-support measures if there was not a reasonable chance of recovery 

## 2017-04-06 NOTE — Progress Notes (Signed)
Subjective:    Patient ID: [name], male    DOB: 09-16-1927, 83 y.o.   MRN: [id number]  HPI  The state of at least three ongoing medical problems is addressed today, with interval history of each noted here: Anemia: he takes fe 1/day.  Denies BRBPR.  This is a stable problem.  Renal insuff: he denies dysuria.  This is a stable problem. Insomnia: pt says this is well-controlled.  This is a stable problem.  Past Medical History:  Diagnosis Date  . BRADYCARDIA 11/13/2008  . Cholelithiasis   . COLONIC POLYPS, HX OF 02/04/2007  . DEPRESSION 02/04/2007  . DIABETES MELLITUS, TYPE II 02/04/2007  . DIVERTICULOSIS, COLON 02/04/2007  . FOREIGN BODY, ASPIRATION 12/04/2007  . GERD (gastroesophageal reflux disease)   . GLAUCOMA 03/08/2009  . HYPERLIPIDEMIA 12/04/2007  . HYPERTENSION 02/04/2007  . OBSTRUCTIVE SLEEP APNEA 11/05/2009   -PSG 01/05/10 RDI 11, PLMI 56  . OSTEOARTHRITIS 02/04/2007  . Peptic stricture of esophagus   . PROSTATE CANCER 11/07/2007  . RENAL INSUFFICIENCY 11/13/2008  . SYNCOPE 12/31/2009  . THROMBOCYTOPENIA 04/17/2008  . THYROID NODULE, RIGHT 11/13/2008  . UNSPECIFIED ANEMIA 11/07/2007    Past Surgical History:  Procedure Laterality Date  . Heart Cartherization  2000  . PROSTATECTOMY  1997  . TONSILLECTOMY  1940's    Social History   Social History  . Marital status: Married    Spouse name: N/A  . Number of children: 3  . Years of education: N/A   Occupational History  . retired     Retired   Social History Main Topics  . Smoking status: Former Smoker    Types: Cigarettes, Pipe    Quit date: 07/08/1993  . Smokeless tobacco: Never Used  . Alcohol use 0.6 oz/week    1 Shots of liquor per week     Comment: 1 shot a day  . Drug use: No  . Sexual activity: Not on file   Other Topics Concern  . Not on file   Social History Narrative   He plays tennis 3x a week    Current Outpatient Prescriptions on File Prior to Visit  Medication Sig Dispense Refill  .  aspirin 81 MG tablet Take 81 mg by mouth daily.    . Cholecalciferol (VITAMIN D) 2000 UNITS CAPS Take 1 capsule by mouth daily.      Marland Kitchen lisinopril (PRINIVIL,ZESTRIL) 5 MG tablet TAKE 1 TABLET BY MOUTH ONE TIME DAILY 90 tablet 0  . omeprazole (PRILOSEC) 40 MG capsule TAKE ONE CAPSULE BY MOUTH ONE TIME DAILY  30 capsule 0  . rosuvastatin (CRESTOR) 40 MG tablet Take 1 tablet (40 mg total) by mouth daily. 90 tablet 3  . traZODone (DESYREL) 100 MG tablet Take 1 tablet (100 mg total) by mouth at bedtime. 30 tablet 0   No current facility-administered medications on file prior to visit.     Allergies  Allergen Reactions  . Lactose Intolerance (Gi)   . Sulfonamide Derivatives     REACTION: Itch/Red Spots    Family History  Problem Relation Age of Onset  . Kidney disease Father   . Heart disease Neg Hx        No FH of Coronary Artery Disease  . Cancer Neg Hx     BP 138/66   Pulse (!) 54   Wt 199 lb 3.2 oz (90.4 kg)   SpO2 97%   BMI 27.02 kg/m    Review of Systems Denies hematuria.  No change  in chronic mild leg edema.     Objective:   Physical Exam VITAL SIGNS:  See vs page GENERAL: no distress NECK: There is no palpable thyroid enlargement.  No thyroid nodule is palpable.  No palpable lymphadenopathy at the anterior neck. LUNGS:  Clear to auscultation HEART:  Regular rate and rhythm without murmurs noted. Normal S1,S2.  Trace bilat leg edema.       Assessment & Plan:  Anemia: recheck today.  Renal insuff: recheck today Insomnia: well-controlled.  Please continue the same medication  Subjective:   Patient here for Medicare annual wellness visit and management of other chronic and acute problems.     Risk factors: advanced age    16 of Physicians Providing Medical Care to Patient:  See "snapshot"   Activities of Daily Living: In your present state of health, do you have any difficulty performing the following activities (lives with wife)?:  Preparing food and  eating?: No  Bathing yourself: No  Getting dressed: No  Using the toilet:No  Moving around from place to place: No  In the past year have you fallen or had a near fall?: No    Home Safety: Has smoke detector and wears seat belts. No firearms. No excess sun exposure.  Opioid Use: none  Diet and Exercise  Current exercise habits: pt says good, as limited by age.  Dietary issues discussed: pt reports a healthy diet  Depression Screen  Q1: Over the past two weeks, have you felt down, depressed or hopeless? no  Q2: Over the past two weeks, have you felt little interest or pleasure in doing things? no   The following portions of the patient's history were reviewed and updated as appropriate: allergies, current medications, past family history, past medical history, past social history, past surgical history and problem list.   Review of Systems  No recent change in chronic hearing or visual losses Objective:   Vision:  Advertising account executive, so he declines VA today.   Hearing: grossly normal (has HA's) Body mass index:  See vs page Msk: pt easily and quickly performs "get-up-and-go" from a sitting position.  Cognitive Impairment Assessment: cognition, memory and judgment appear normal.  remembers 2/3 at 5 minutes.  excellent recall.  can easily read and write a sentence.  alert and oriented x 3.    Assessment:   Medicare wellness utd on preventive parameters.     Plan:   During the course of the visit the patient was educated and counseled about appropriate screening and preventive services including:        Fall prevention is advised today.  Diabetes screening.  Nutrition counseling is offered.   Vaccines are updated as needed.   Patient Instructions (the written plan) was given to the patient.

## 2017-04-13 ENCOUNTER — Other Ambulatory Visit: Payer: Self-pay | Admitting: Endocrinology

## 2017-05-02 DIAGNOSIS — D3132 Benign neoplasm of left choroid: Secondary | ICD-10-CM | POA: Diagnosis not present

## 2017-05-02 DIAGNOSIS — H43813 Vitreous degeneration, bilateral: Secondary | ICD-10-CM | POA: Diagnosis not present

## 2017-05-04 DIAGNOSIS — Z961 Presence of intraocular lens: Secondary | ICD-10-CM | POA: Diagnosis not present

## 2017-05-04 DIAGNOSIS — H02045 Spastic entropion of left lower eyelid: Secondary | ICD-10-CM | POA: Diagnosis not present

## 2017-05-04 DIAGNOSIS — E119 Type 2 diabetes mellitus without complications: Secondary | ICD-10-CM | POA: Diagnosis not present

## 2017-05-04 DIAGNOSIS — D3132 Benign neoplasm of left choroid: Secondary | ICD-10-CM | POA: Diagnosis not present

## 2017-05-04 DIAGNOSIS — H5702 Anisocoria: Secondary | ICD-10-CM | POA: Diagnosis not present

## 2017-05-04 DIAGNOSIS — H40013 Open angle with borderline findings, low risk, bilateral: Secondary | ICD-10-CM | POA: Diagnosis not present

## 2017-05-12 ENCOUNTER — Other Ambulatory Visit: Payer: Self-pay | Admitting: Endocrinology

## 2017-05-24 DIAGNOSIS — J029 Acute pharyngitis, unspecified: Secondary | ICD-10-CM | POA: Diagnosis not present

## 2017-05-24 DIAGNOSIS — H6123 Impacted cerumen, bilateral: Secondary | ICD-10-CM | POA: Diagnosis not present

## 2017-05-24 DIAGNOSIS — R05 Cough: Secondary | ICD-10-CM | POA: Diagnosis not present

## 2017-05-31 DIAGNOSIS — R05 Cough: Secondary | ICD-10-CM | POA: Diagnosis not present

## 2017-05-31 DIAGNOSIS — J029 Acute pharyngitis, unspecified: Secondary | ICD-10-CM | POA: Diagnosis not present

## 2017-06-07 DIAGNOSIS — J029 Acute pharyngitis, unspecified: Secondary | ICD-10-CM | POA: Diagnosis not present

## 2017-06-07 DIAGNOSIS — R05 Cough: Secondary | ICD-10-CM | POA: Diagnosis not present

## 2017-06-11 ENCOUNTER — Other Ambulatory Visit: Payer: Self-pay | Admitting: Endocrinology

## 2017-06-13 ENCOUNTER — Other Ambulatory Visit: Payer: Self-pay | Admitting: Endocrinology

## 2017-06-14 DIAGNOSIS — R0982 Postnasal drip: Secondary | ICD-10-CM | POA: Diagnosis not present

## 2017-06-14 DIAGNOSIS — R05 Cough: Secondary | ICD-10-CM | POA: Diagnosis not present

## 2017-06-28 ENCOUNTER — Other Ambulatory Visit: Payer: Self-pay | Admitting: Endocrinology

## 2017-07-21 ENCOUNTER — Other Ambulatory Visit: Payer: Self-pay | Admitting: Endocrinology

## 2017-07-22 ENCOUNTER — Telehealth: Payer: Self-pay | Admitting: Endocrinology

## 2017-07-22 ENCOUNTER — Other Ambulatory Visit: Payer: Self-pay

## 2017-07-22 NOTE — Telephone Encounter (Signed)
Patient has been unable to get his meds Scripts need to be renewed  at [facility] asap-[facility] has been unable to get authorization from Dr Loanne Drilling If questions patient is at ph# [phone]

## 2017-07-30 DIAGNOSIS — R197 Diarrhea, unspecified: Secondary | ICD-10-CM | POA: Diagnosis not present

## 2017-07-30 DIAGNOSIS — Z20828 Contact with and (suspected) exposure to other viral communicable diseases: Secondary | ICD-10-CM | POA: Diagnosis not present

## 2017-08-16 ENCOUNTER — Other Ambulatory Visit: Payer: Self-pay | Admitting: Endocrinology

## 2017-09-02 ENCOUNTER — Telehealth: Payer: Self-pay | Admitting: Endocrinology

## 2017-09-02 NOTE — Telephone Encounter (Signed)
[facility] called about paper that was sent over. It was for confidential medical profile that needs to be filled out by dr and they need notes for the latest physical.   Please advise [phone]

## 2017-09-05 ENCOUNTER — Telehealth: Payer: Self-pay | Admitting: Endocrinology

## 2017-09-05 ENCOUNTER — Other Ambulatory Visit: Payer: Self-pay | Admitting: Endocrinology

## 2017-09-05 NOTE — Telephone Encounter (Signed)
[name] from [facility] is calling on the status of medical records form that was sent to our office and a form that needed to be filled out , please let them know if you received them. [phone]

## 2017-09-05 NOTE — Telephone Encounter (Signed)
[name] stated she got the fax from our office, but she still need the most recent health and physical notes sent to her . Fax # [phone]

## 2017-09-05 NOTE — Telephone Encounter (Signed)
I called [facility] & they are faxing paperwork to back fax.

## 2017-09-05 NOTE — Telephone Encounter (Signed)
I have called [facility] & they are faxing paperwork to our back fax.

## 2017-09-05 NOTE — Telephone Encounter (Signed)
I have faxed last OV & encounter notes.

## 2017-09-07 ENCOUNTER — Ambulatory Visit (INDEPENDENT_AMBULATORY_CARE_PROVIDER_SITE_OTHER): Payer: Medicare Other | Admitting: Endocrinology

## 2017-09-07 ENCOUNTER — Encounter: Payer: Self-pay | Admitting: Endocrinology

## 2017-09-07 DIAGNOSIS — K5900 Constipation, unspecified: Secondary | ICD-10-CM | POA: Diagnosis not present

## 2017-09-07 NOTE — Progress Notes (Signed)
Subjective:    Patient ID: [name], male    DOB: 08/31/1927, 84 y.o.   MRN: [id number]  HPI Pt says he has recent irreg BM's, but no bleeding from the rectum.  No assoc abd pain.   Past Medical History:  Diagnosis Date  . BRADYCARDIA 11/13/2008  . Cholelithiasis   . COLONIC POLYPS, HX OF 02/04/2007  . DEPRESSION 02/04/2007  . DIABETES MELLITUS, TYPE II 02/04/2007  . DIVERTICULOSIS, COLON 02/04/2007  . FOREIGN BODY, ASPIRATION 12/04/2007  . GERD (gastroesophageal reflux disease)   . GLAUCOMA 03/08/2009  . HYPERLIPIDEMIA 12/04/2007  . HYPERTENSION 02/04/2007  . OBSTRUCTIVE SLEEP APNEA 11/05/2009   -PSG 01/05/10 RDI 11, PLMI 56  . OSTEOARTHRITIS 02/04/2007  . Peptic stricture of esophagus   . PROSTATE CANCER 11/07/2007  . RENAL INSUFFICIENCY 11/13/2008  . SYNCOPE 12/31/2009  . THROMBOCYTOPENIA 04/17/2008  . THYROID NODULE, RIGHT 11/13/2008  . UNSPECIFIED ANEMIA 11/07/2007    Past Surgical History:  Procedure Laterality Date  . Heart Cartherization  2000  . PROSTATECTOMY  1997  . TONSILLECTOMY  1940's    Social History   Socioeconomic History  . Marital status: Married    Spouse name: Not on file  . Number of children: 3  . Years of education: Not on file  . Highest education level: Not on file  Social Needs  . Financial resource strain: Not on file  . Food insecurity - worry: Not on file  . Food insecurity - inability: Not on file  . Transportation needs - medical: Not on file  . Transportation needs - non-medical: Not on file  Occupational History  . Occupation: retired    Comment: Retired  Tobacco Use  . Smoking status: Former Smoker    Types: Cigarettes, Pipe    Last attempt to quit: 07/08/1993    Years since quitting: 24.1  . Smokeless tobacco: Never Used  Substance and Sexual Activity  . Alcohol use: Yes    Alcohol/week: 0.6 oz    Types: 1 Shots of liquor per week    Comment: 1 shot a day  . Drug use: No  . Sexual activity: Not on file  Other Topics Concern  .  Not on file  Social History Narrative   He plays tennis 3x a week    Current Outpatient Medications on File Prior to Visit  Medication Sig Dispense Refill  . aspirin 81 MG tablet Take 81 mg by mouth daily.    . Cholecalciferol (VITAMIN D) 2000 UNITS CAPS Take 1 capsule by mouth daily.      Marland Kitchen lisinopril (PRINIVIL,ZESTRIL) 5 MG tablet TAKE 1 TABLET (5 MG TOTAL) BY MOUTH DAILY. 90 tablet 0  . omeprazole (PRILOSEC) 40 MG capsule TAKE ONE CAPSULE BY MOUTH ONE TIME DAILY  30 capsule 0  . rosuvastatin (CRESTOR) 40 MG tablet TAKE ONE TABLET BY MOUTH ONE TIME DAILY 90 tablet 1  . traZODone (DESYREL) 100 MG tablet TAKE ONE TABLET BY MOUTH AT BEDTIME  30 tablet 0   No current facility-administered medications on file prior to visit.     Allergies  Allergen Reactions  . Lactose Intolerance (Gi)   . Sulfonamide Derivatives     REACTION: Itch/Red Spots    Family History  Problem Relation Age of Onset  . Kidney disease Father   . Heart disease Neg Hx        No FH of Coronary Artery Disease  . Cancer Neg Hx     BP (!) 142/76 (BP  Location: Left Arm, Patient Position: Sitting, Cuff Size: Normal)   Pulse (!) 58   Wt 198 lb (89.8 kg)   SpO2 98%   BMI 26.85 kg/m    Review of Systems No weight change.  No n/v.      Objective:   Physical Exam VITAL SIGNS:  See vs page.  GENERAL: no distress.   ABDOMEN: abdomen is soft, nontender.  no hepatosplenomegaly.  not distended.  no hernia.        Assessment & Plan:  Constipation, new  Patient Instructions  Please take a capful of "Miralax," once a day, with water.   I'll see you next time.

## 2017-09-07 NOTE — Patient Instructions (Addendum)
Please take a capful of "Miralax," once a day, with water.   I'll see you next time.

## 2017-09-09 DIAGNOSIS — K5900 Constipation, unspecified: Secondary | ICD-10-CM | POA: Insufficient documentation

## 2017-09-14 ENCOUNTER — Other Ambulatory Visit: Payer: Self-pay | Admitting: Endocrinology

## 2017-09-28 ENCOUNTER — Encounter: Payer: Self-pay | Admitting: Podiatry

## 2017-09-28 ENCOUNTER — Ambulatory Visit (INDEPENDENT_AMBULATORY_CARE_PROVIDER_SITE_OTHER): Payer: Medicare Other | Admitting: Podiatry

## 2017-09-28 ENCOUNTER — Ambulatory Visit (INDEPENDENT_AMBULATORY_CARE_PROVIDER_SITE_OTHER): Payer: Medicare Other

## 2017-09-28 VITALS — BP 146/69 | HR 51 | Resp 16

## 2017-09-28 DIAGNOSIS — M2041 Other hammer toe(s) (acquired), right foot: Secondary | ICD-10-CM

## 2017-09-28 DIAGNOSIS — M7750 Other enthesopathy of unspecified foot: Secondary | ICD-10-CM | POA: Diagnosis not present

## 2017-09-28 DIAGNOSIS — M779 Enthesopathy, unspecified: Secondary | ICD-10-CM

## 2017-09-28 MED ORDER — TRIAMCINOLONE ACETONIDE 10 MG/ML IJ SUSP
10.0000 mg | Freq: Once | INTRAMUSCULAR | Status: AC
  Administered 2017-09-28: 10 mg

## 2017-09-28 NOTE — Progress Notes (Signed)
Subjective:   Patient ID: [name], male   DOB: 84 y.o.   MRN: [id number]   HPI Patient presents with painful digital deformity fifth digit right that he states makes it hard to wear shoe gear comfortably and he had worked on around 6 years ago.  States is gotten worse over the last few months patient does not currently smoke and likes to be active   Review of Systems  All other systems reviewed and are negative.       Objective:  Physical Exam  Constitutional: He appears well-developed and well-nourished.  Cardiovascular: Intact distal pulses.  Pulmonary/Chest: Effort normal.  Musculoskeletal: Normal range of motion.  Neurological: He is alert.  Skin: Skin is warm.  Nursing note and vitals reviewed.   Neurovascular status found to be intact muscle strength adequate patient's fifth digit right inflamed at the proximal phalangeal joint with keratotic tissue formation that is painful when pressed making shoe gear difficult.  Patient has relative rotation of the toe and states that it is increasingly tender over time.  Good digital perfusion noted     Assessment:  Inflammatory capsulitis fifth digit right with hammertoe deformity and keratotic lesion formation     Plan:  H&P x-ray reviewed and today I did a proximal nerve block and under sterile conditions I injected the interphalangeal joint 2 mg dexamethasone Kenalog 2 mg Xylocaine debrided lesion applied padding and advised to be seen back and discuss possible hammertoe arthroplasty at one point in future  X-ray indicates that there is slight elongation of the bone of the fifth digit right foot with mild osteoporosis noted

## 2017-10-05 ENCOUNTER — Ambulatory Visit: Payer: Medicare Other | Admitting: Endocrinology

## 2017-10-11 ENCOUNTER — Other Ambulatory Visit: Payer: Self-pay | Admitting: Endocrinology

## 2017-10-12 ENCOUNTER — Other Ambulatory Visit: Payer: Self-pay | Admitting: Endocrinology

## 2017-10-12 NOTE — Telephone Encounter (Signed)
Ok to refill 

## 2017-10-13 ENCOUNTER — Other Ambulatory Visit: Payer: Self-pay | Admitting: Endocrinology

## 2017-10-13 NOTE — Telephone Encounter (Signed)
Patienr stated he called his pharmacy twice and they have not received his medication for  omeprazole (PRILOSEC) 40 MG capsule [[id number]]  traZODone (DESYREL) 100 MG tablet [[id number]]  Please call it in today  [facility] [address], [place] [phone] (Phone) [phone] (Fax)

## 2017-10-13 NOTE — Telephone Encounter (Signed)
Please refill prn 

## 2017-10-14 ENCOUNTER — Other Ambulatory Visit: Payer: Self-pay | Admitting: Endocrinology

## 2017-10-17 ENCOUNTER — Other Ambulatory Visit: Payer: Self-pay

## 2017-10-17 MED ORDER — OMEPRAZOLE 40 MG PO CPDR: 40.0000 mg | DELAYED_RELEASE_CAPSULE | Freq: Every day | ORAL | 99 refills | Status: DC

## 2017-10-17 MED ORDER — TRAZODONE HCL 100 MG PO TABS: 100.0000 mg | ORAL_TABLET | Freq: Every day | ORAL | 99 refills | Status: DC

## 2017-10-31 DIAGNOSIS — L300 Nummular dermatitis: Secondary | ICD-10-CM | POA: Diagnosis not present

## 2017-10-31 DIAGNOSIS — Z85828 Personal history of other malignant neoplasm of skin: Secondary | ICD-10-CM | POA: Diagnosis not present

## 2017-11-16 ENCOUNTER — Other Ambulatory Visit: Payer: Self-pay | Admitting: Endocrinology

## 2017-11-22 ENCOUNTER — Encounter: Payer: Self-pay | Admitting: Endocrinology

## 2017-11-22 ENCOUNTER — Ambulatory Visit (INDEPENDENT_AMBULATORY_CARE_PROVIDER_SITE_OTHER): Payer: Medicare Other | Admitting: Endocrinology

## 2017-11-22 VITALS — BP 120/58 | HR 62 | Wt 196.0 lb

## 2017-11-22 DIAGNOSIS — D696 Thrombocytopenia, unspecified: Secondary | ICD-10-CM | POA: Diagnosis not present

## 2017-11-22 DIAGNOSIS — N183 Chronic kidney disease, stage 3 (moderate): Secondary | ICD-10-CM

## 2017-11-22 DIAGNOSIS — E1122 Type 2 diabetes mellitus with diabetic chronic kidney disease: Secondary | ICD-10-CM | POA: Diagnosis not present

## 2017-11-22 LAB — CBC WITH DIFFERENTIAL/PLATELET
BASOS ABS: 0 10*3/uL (ref 0.0–0.1)
Basophils Relative: 0.2 % (ref 0.0–3.0)
Eosinophils Absolute: 0 10*3/uL (ref 0.0–0.7)
Eosinophils Relative: 0 % (ref 0.0–5.0)
HCT: 37.5 % — ABNORMAL LOW (ref 39.0–52.0)
Hemoglobin: 12.8 g/dL — ABNORMAL LOW (ref 13.0–17.0)
LYMPHS ABS: 2.3 10*3/uL (ref 0.7–4.0)
Lymphocytes Relative: 26.4 % (ref 12.0–46.0)
MCHC: 34.1 g/dL (ref 30.0–36.0)
MCV: 93.9 fl (ref 78.0–100.0)
MONO ABS: 1 10*3/uL (ref 0.1–1.0)
MONOS PCT: 11.7 % (ref 3.0–12.0)
NEUTROS ABS: 5.5 10*3/uL (ref 1.4–7.7)
NEUTROS PCT: 61.7 % (ref 43.0–77.0)
PLATELETS: 116 10*3/uL — AB (ref 150.0–400.0)
RBC: 3.99 Mil/uL — ABNORMAL LOW (ref 4.22–5.81)
RDW: 13.8 % (ref 11.5–15.5)
WBC: 8.9 10*3/uL (ref 4.0–10.5)

## 2017-11-22 LAB — BASIC METABOLIC PANEL
BUN: 41 mg/dL — ABNORMAL HIGH (ref 6–23)
CALCIUM: 8.8 mg/dL (ref 8.4–10.5)
CO2: 26 mEq/L (ref 19–32)
CREATININE: 1.89 mg/dL — AB (ref 0.40–1.50)
Chloride: 106 mEq/L (ref 96–112)
GFR: 35.94 mL/min — ABNORMAL LOW (ref >60.00)
Glucose, Bld: 183 mg/dL — ABNORMAL HIGH (ref 70–99)
Potassium: 4.6 mEq/L (ref 3.5–5.1)
SODIUM: 138 meq/L (ref 135–145)

## 2017-11-22 LAB — POCT GLYCOSYLATED HEMOGLOBIN (HGB A1C): Hemoglobin A1C: 6 % — AB (ref 4.0–5.6)

## 2017-11-22 LAB — LIPID PANEL
Cholesterol: 119 mg/dL (ref 0–200)
HDL: 29.1 mg/dL — AB (ref >39.00)
LDL CALC: 57 mg/dL (ref 0–99)
NonHDL: 89.89
Total CHOL/HDL Ratio: 4
Triglycerides: 163 mg/dL — ABNORMAL HIGH (ref 0.0–149.0)
VLDL: 32.6 mg/dL (ref 0.0–40.0)

## 2017-11-22 LAB — TSH: TSH: 4.6 u[IU]/mL — ABNORMAL HIGH (ref 0.35–4.50)

## 2017-11-22 MED ORDER — LEVOTHYROXINE SODIUM 25 MCG PO TABS: 25.0000 ug | ORAL_TABLET | Freq: Every day | ORAL | 3 refills | Status: DC

## 2017-11-22 NOTE — Patient Instructions (Addendum)
Loratadine (non-prescription) will help your symptoms.   blood tests are requested for you today.  We'll let you know about the results. Please come back for a follow-up appointment in 6 months.   No medication is needed for the blood sugar.

## 2017-11-22 NOTE — Progress Notes (Signed)
Subjective:    Patient ID: [name], male    DOB: September 17, 1927, 84 y.o.   MRN: [id number]  HPI Pt states 10 days of slight congestion in the nose, and assoc rhinorrhea.   Past Medical History:  Diagnosis Date  . BRADYCARDIA 11/13/2008  . Cholelithiasis   . COLONIC POLYPS, HX OF 02/04/2007  . DEPRESSION 02/04/2007  . DIABETES MELLITUS, TYPE II 02/04/2007  . DIVERTICULOSIS, COLON 02/04/2007  . FOREIGN BODY, ASPIRATION 12/04/2007  . GERD (gastroesophageal reflux disease)   . GLAUCOMA 03/08/2009  . HYPERLIPIDEMIA 12/04/2007  . HYPERTENSION 02/04/2007  . OBSTRUCTIVE SLEEP APNEA 11/05/2009   -PSG 01/05/10 RDI 11, PLMI 56  . OSTEOARTHRITIS 02/04/2007  . Peptic stricture of esophagus   . PROSTATE CANCER 11/07/2007  . RENAL INSUFFICIENCY 11/13/2008  . SYNCOPE 12/31/2009  . THROMBOCYTOPENIA 04/17/2008  . THYROID NODULE, RIGHT 11/13/2008  . UNSPECIFIED ANEMIA 11/07/2007    Past Surgical History:  Procedure Laterality Date  . Heart Cartherization  2000  . PROSTATECTOMY  1997  . TONSILLECTOMY  1940's    Social History   Socioeconomic History  . Marital status: Married    Spouse name: Not on file  . Number of children: 3  . Years of education: Not on file  . Highest education level: Not on file  Occupational History  . Occupation: retired    Comment: Retired  Scientific laboratory technician  . Financial resource strain: Not on file  . Food insecurity:    Worry: Not on file    Inability: Not on file  . Transportation needs:    Medical: Not on file    Non-medical: Not on file  Tobacco Use  . Smoking status: Former Smoker    Types: Cigarettes, Pipe    Last attempt to quit: 07/08/1993    Years since quitting: 24.3  . Smokeless tobacco: Never Used  Substance and Sexual Activity  . Alcohol use: Yes    Alcohol/week: 0.6 oz    Types: 1 Shots of liquor per week    Comment: 1 shot a day  . Drug use: No  . Sexual activity: Not on file  Lifestyle  . Physical activity:    Days per week: Not on file   Minutes per session: Not on file  . Stress: Not on file  Relationships  . Social connections:    Talks on phone: Not on file    Gets together: Not on file    Attends religious service: Not on file    Active member of club or organization: Not on file    Attends meetings of clubs or organizations: Not on file    Relationship status: Not on file  . Intimate partner violence:    Fear of current or ex partner: Not on file    Emotionally abused: Not on file    Physically abused: Not on file    Forced sexual activity: Not on file  Other Topics Concern  . Not on file  Social History Narrative   He plays tennis 3x a week    Current Outpatient Medications on File Prior to Visit  Medication Sig Dispense Refill  . aspirin 81 MG tablet Take 81 mg by mouth daily.    . Cholecalciferol (VITAMIN D) 2000 UNITS CAPS Take 1 capsule by mouth daily.      Marland Kitchen lisinopril (PRINIVIL,ZESTRIL) 5 MG tablet TAKE ONE TABLET BY MOUTH ONE TIME DAILY 90 tablet 0  . omeprazole (PRILOSEC) 40 MG capsule Take 1 capsule (40 mg  total) by mouth daily. 30 capsule prn  . rosuvastatin (CRESTOR) 40 MG tablet TAKE ONE TABLET BY MOUTH ONE TIME DAILY 90 tablet 1  . traZODone (DESYREL) 100 MG tablet take 1 tablet by mouth at bedtime 30 tablet 0   No current facility-administered medications on file prior to visit.     Allergies  Allergen Reactions  . Lactose Intolerance (Gi)   . Sulfonamide Derivatives     REACTION: Itch/Red Spots    Family History  Problem Relation Age of Onset  . Kidney disease Father   . Heart disease Neg Hx        No FH of Coronary Artery Disease  . Cancer Neg Hx     BP (!) 120/58   Pulse 62   Wt 196 lb (88.9 kg)   SpO2 98%   BMI 26.58 kg/m    Review of Systems Denies fever and earache.      Objective:   Physical Exam VITAL SIGNS:  See vs page GENERAL: no distress head: no deformity  eyes: no periorbital swelling, no proptosis  external nose and ears are normal  mouth: no lesion  seen.  Both eac's and tm's are normal.  LUNGS:  Clear to auscultation.     Lab Results  Component Value Date   HGBA1C 6.0 (A) 11/22/2017   Lab Results  Component Value Date   TSH 4.60 (H) 11/22/2017      Assessment & Plan:  URI vs allergic sxs, new Type 2 DM: well-controlled off medication.  We'll follow Hypothyroidism: he needs increased rx   Patient Instructions  Loratadine (non-prescription) will help your symptoms.   blood tests are requested for you today.  We'll let you know about the results. Please come back for a follow-up appointment in 6 months.   No medication is needed for the blood sugar.

## 2017-11-23 ENCOUNTER — Other Ambulatory Visit: Payer: Self-pay

## 2017-12-01 ENCOUNTER — Telehealth: Payer: Self-pay | Admitting: Endocrinology

## 2017-12-01 NOTE — Telephone Encounter (Signed)
Patient dropped off form on Monday 11/28/17 that permits him to exercise and go the gym. He gave the form to [place] with a prestamped envelope to send form back in. Patient cannot go to gym until this form has been filled out, signed by Dr and returned.

## 2017-12-01 NOTE — Telephone Encounter (Signed)
I called patient & let him know that we did have his paperwork. It has been put on Dr. Cordelia Pen desk to be filled out. I stated that as soon as it was completed that I would put it in the mail for him.

## 2017-12-13 ENCOUNTER — Other Ambulatory Visit: Payer: Self-pay | Admitting: Endocrinology

## 2017-12-16 DIAGNOSIS — Z85828 Personal history of other malignant neoplasm of skin: Secondary | ICD-10-CM | POA: Diagnosis not present

## 2017-12-16 DIAGNOSIS — C44622 Squamous cell carcinoma of skin of right upper limb, including shoulder: Secondary | ICD-10-CM | POA: Diagnosis not present

## 2017-12-19 ENCOUNTER — Telehealth: Payer: Self-pay | Admitting: Endocrinology

## 2017-12-19 DIAGNOSIS — R413 Other amnesia: Secondary | ICD-10-CM

## 2017-12-19 NOTE — Telephone Encounter (Signed)
Patient Would like a referral sent to Dr Stana Bunting) office for him to be seen.  They would like a call back once this referral has been sent

## 2017-12-20 NOTE — Telephone Encounter (Signed)
I need to know the reason

## 2017-12-21 DIAGNOSIS — R413 Other amnesia: Secondary | ICD-10-CM | POA: Insufficient documentation

## 2017-12-21 NOTE — Telephone Encounter (Signed)
Patient is very anxious & having memory issues. His wife she's Dr. Krista Blue & feels he would benefit greatly from seeing this physician.

## 2017-12-21 NOTE — Telephone Encounter (Signed)
Notified patient's wife that referral was put in.

## 2017-12-21 NOTE — Telephone Encounter (Signed)
Ok, I put in referral

## 2017-12-26 ENCOUNTER — Telehealth: Payer: Self-pay | Admitting: Endocrinology

## 2017-12-26 NOTE — Telephone Encounter (Signed)
I have forwarded to [facility] & LVM with patient that referral was sent just needed [facility] to send it to [facility].

## 2017-12-26 NOTE — Telephone Encounter (Signed)
Patient says she gave Judson Roch the name of the doctor to refer her husband to doctor, Dr Evelena Leyden with [facility] but she sent the referral to [place] instead.   And need that corrected.  Cell [phone]  Patient said need sarah to respone as soon as possible, please leave a message on the answering machanie in case she do nnot answer.  Called the [facility]

## 2017-12-26 NOTE — Telephone Encounter (Signed)
I was asked to forward the following to you. Please also see Dr. Cordelia Pen note.   Patient says she gave Judson Roch the name of the doctor to refer her husband to doctor, Dr Krista Blue with [facility], but she sent the referral to [place] instead.   Cell [phone]  Patient said need sarah to respone as soon as possible, please leave a message on the answering machanie in case she do nnot answer.

## 2017-12-26 NOTE — Telephone Encounter (Signed)
I didn't specify a particular office.  Please forward message to [facility].

## 2017-12-26 NOTE — Telephone Encounter (Signed)
Patients wife stated that referral was sent to the incorrect office. She needed it sent to [facility]. To see DR Krista Blue  She would like a call back when this has been sent .

## 2018-01-02 ENCOUNTER — Telehealth: Payer: Self-pay | Admitting: *Deleted

## 2018-01-02 NOTE — Telephone Encounter (Signed)
Spoke to patient's wife and she would like a phone call to schedule his new patient appt.  The referral has been placed in Epic by his PCP.

## 2018-01-16 DIAGNOSIS — Z85828 Personal history of other malignant neoplasm of skin: Secondary | ICD-10-CM | POA: Diagnosis not present

## 2018-01-16 DIAGNOSIS — C44622 Squamous cell carcinoma of skin of right upper limb, including shoulder: Secondary | ICD-10-CM | POA: Diagnosis not present

## 2018-01-17 DIAGNOSIS — E113293 Type 2 diabetes mellitus with mild nonproliferative diabetic retinopathy without macular edema, bilateral: Secondary | ICD-10-CM | POA: Diagnosis not present

## 2018-01-17 DIAGNOSIS — H43813 Vitreous degeneration, bilateral: Secondary | ICD-10-CM | POA: Diagnosis not present

## 2018-01-17 DIAGNOSIS — Z961 Presence of intraocular lens: Secondary | ICD-10-CM | POA: Diagnosis not present

## 2018-01-17 DIAGNOSIS — H35373 Puckering of macula, bilateral: Secondary | ICD-10-CM | POA: Diagnosis not present

## 2018-01-17 DIAGNOSIS — D3132 Benign neoplasm of left choroid: Secondary | ICD-10-CM | POA: Diagnosis not present

## 2018-01-18 ENCOUNTER — Non-Acute Institutional Stay: Payer: Medicare Other | Admitting: Internal Medicine

## 2018-01-18 ENCOUNTER — Encounter: Payer: Self-pay | Admitting: Internal Medicine

## 2018-01-18 VITALS — BP 122/62 | HR 54 | Temp 98.7°F | Ht 72.0 in | Wt 197.0 lb

## 2018-01-18 DIAGNOSIS — N183 Chronic kidney disease, stage 3 unspecified: Secondary | ICD-10-CM

## 2018-01-18 DIAGNOSIS — R413 Other amnesia: Secondary | ICD-10-CM

## 2018-01-18 DIAGNOSIS — Z794 Long term (current) use of insulin: Secondary | ICD-10-CM | POA: Diagnosis not present

## 2018-01-18 DIAGNOSIS — R001 Bradycardia, unspecified: Secondary | ICD-10-CM

## 2018-01-18 DIAGNOSIS — E039 Hypothyroidism, unspecified: Secondary | ICD-10-CM | POA: Diagnosis not present

## 2018-01-18 DIAGNOSIS — E1122 Type 2 diabetes mellitus with diabetic chronic kidney disease: Secondary | ICD-10-CM

## 2018-01-18 DIAGNOSIS — E113293 Type 2 diabetes mellitus with mild nonproliferative diabetic retinopathy without macular edema, bilateral: Secondary | ICD-10-CM | POA: Diagnosis not present

## 2018-01-18 DIAGNOSIS — C61 Malignant neoplasm of prostate: Secondary | ICD-10-CM

## 2018-01-18 DIAGNOSIS — F320 Major depressive disorder, single episode, mild: Secondary | ICD-10-CM

## 2018-01-18 MED ORDER — SERTRALINE HCL 25 MG PO TABS: 25.0000 mg | ORAL_TABLET | Freq: Every day | ORAL | 5 refills | Status: DC

## 2018-01-18 NOTE — Progress Notes (Signed)
Provider:  Rexene Edison. Mariea Clonts, D.O., C.M.D. Location:   [facility]   Place of Service:   clinic  Previous PCP: Renato Shin, MD Patient Care Team: Renato Shin, MD as PCP - General Nobie Putnam, MD (Hematology and Oncology) Sherren Mocha, MD (Cardiology) Martinique, Amy, MD as Consulting Physician (Dermatology)  Extended Emergency Contact Information Primary Emergency Contact: Burdell,Simone Address: [address]          [place], [place] [address] Johnnette Litter of [place] Phone: [phone] Mobile Phone: [phone] Relation: Spouse  Code Status: DNR, living will, hcpoa per pt--copies being obtained from [place] for scanning Goals of Care: Advanced Directive information Advanced Directives 01/18/2018  Does Patient Have a Medical Advance Directive? Yes  Type of Paramedic of [place];Living will  Does patient want to make changes to medical advance directive? No - Patient declined  Copy of [place] in Chart? Yes  Would patient like information on creating a medical advance directive? -      Chief Complaint  Patient presents with  . Establish Care    new patient    HPI: Patient is a 84 y.o. male seen today to establish with [facility].  Records are in epic from Dr. Loanne Drilling.  [place] 7 [place].    Lived in [place] many years and had sold a condo in [place] and here and came from [place].  His wife wanted them to move here.  They moved twice in 4.5 years.  She's had to take an antistress med.  He gets frustrated easily.  When he's calm, he is great, but if he has the slightest issue, he gets very upset.  Choosing the wrong wine was even a big deal at [employer].  He admits to easy frustration.  They had to change medical plans.    They've been married 25 years.    He had a skin cancer on his right hand.  Dr. Sarajane Jews dermatologist removed .    DMII:  hba1c has been 6.  He does watch his  diet.  His wife reports he eats a lot of bread. He has gained 3 lbs since he's been here.  They had also been at the beach.  He just saw ophtho yesterday and has a "freckle" on his retina.  Goes to [facility]--Dr. Manuella Ghazi.  He has mild nonproliferative diabetic retinopathy.  Purely diet controlled.  He used to walk for 30 mins daily.  They've walked some but not as conscientiously.  Did go to a balance class and plans to walk on the treadmill.  They may walk in the halls in winter.  Takes the lisinopril just to protect his kidneys.  Kidneys have been slightly abnormal for a number of years.    Hyperlipidemia:  On crestor.  LDL was 57 in May.    He's on a new medication since May--levothyroxine--taking properly.    He has mild anemia.  He is on an otc iron supplement.  He's been on it for a year or so.  Ferrous sulfate.    He is hard of hearing.  Bilateral hearing aids.  Wears glasses.    He is on omeprazole for a number of years.  He may have had acid indigestion.  Try to taper off.    Takes vitamin D, but he's not sure why.    2009, no colon polyps on cscope.    Bradycardia:  HR 54,  in history.  No dizziness or lightheadedness.  He is not as up as he used to be and gets stressed easily.  Takes the trazodone for rest.  He occasionally has struggled to sleep.     He used to have a drink a day.  Now about 3x per week.  Likes scotch.  Quit smoking at the time of retirement.    They have three girls.  2 in [place] and one in [place].  Takes tart cherry and when he stopped it, his neck started to hurt so started back.  Back sometimes bothers him typically when seated.  Will be sore sometimes if he rolls over in bed.  Not a problem when walking or moving.    Past Medical History:  Diagnosis Date  . BRADYCARDIA 11/13/2008  . Cholelithiasis   . COLONIC POLYPS, HX OF 02/04/2007  . DEPRESSION 02/04/2007  . DIABETES MELLITUS, TYPE II 02/04/2007  . DIVERTICULOSIS, COLON 02/04/2007  . FOREIGN  BODY, ASPIRATION 12/04/2007  . GERD (gastroesophageal reflux disease)   . GLAUCOMA 03/08/2009  . Hepatitis A 1963  . HYPERLIPIDEMIA 12/04/2007  . HYPERTENSION 02/04/2007  . OBSTRUCTIVE SLEEP APNEA 11/05/2009   -PSG 01/05/10 RDI 11, PLMI 56  . OSTEOARTHRITIS 02/04/2007  . Peptic stricture of esophagus   . PROSTATE CANCER 1997  . RENAL INSUFFICIENCY 11/13/2008  . SYNCOPE 12/31/2009  . THROMBOCYTOPENIA 04/17/2008  . THYROID NODULE, RIGHT 11/13/2008  . UNSPECIFIED ANEMIA 11/07/2007   Past Surgical History:  Procedure Laterality Date  . Heart Cartherization  2000  . PROSTATECTOMY  1997  . TONSILLECTOMY  1940's    Social History   Socioeconomic History  . Marital status: Married    Spouse name: Not on file  . Number of children: 3  . Years of education: Not on file  . Highest education level: Not on file  Occupational History  . 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 Financial resource strain: Not on file  . Food insecurity:    Worry: Not on file    Inability: Not on file  . Transportation needs:    Medical: Not on file    Non-medical: Not on file  Tobacco Use  . Smoking status: Former Smoker    Types: Cigarettes, Pipe    Last attempt to quit: 07/08/1993    Years since quitting: 24.5  . Smokeless tobacco: Never Used  Substance and Sexual Activity  . Alcohol use: Yes    Alcohol/week: 0.6 oz    Types: 1 Shots of liquor per week    Comment: 1 shot a day of scotch (not always)  . Drug use: No  . Sexual activity: Not on file  Lifestyle  . Physical activity:    Days per week: Not on file    Minutes per session: Not on file  . Stress: Not on file  Relationships  . Social connections:    Talks on phone: Not on file    Gets together: Not on file    Attends religious service: Not on file    Active member of club or organization: Not on file    Attends meetings of clubs or organizations: Not on file    Relationship status: Not on  file  Other Topics Concern  . Not on file  Social History Narrative   He played tennis 3x a week before move to [place]    reports that he quit smoking about 24 years ago. His smoking use included  cigarettes and pipe. He has never used smokeless tobacco. He reports that he drinks about 0.6 oz of alcohol per week. He reports that he does not use drugs.  Functional Status Survey: Is the patient deaf or have difficulty hearing?: Yes Does the patient have difficulty seeing, even when wearing glasses/contacts?: No Does the patient have difficulty concentrating, remembering, or making decisions?: Yes Does the patient have difficulty walking or climbing stairs?: No Does the patient have difficulty dressing or bathing?: No Does the patient have difficulty doing errands alone such as visiting a doctor's office or shopping?: No  Family History  Problem Relation Age of Onset  . Kidney disease Father   . Heart disease Neg Hx        No FH of Coronary Artery Disease  . Cancer Neg Hx     Health Maintenance  Topic Date Due  . INFLUENZA VACCINE  01/26/2018  . FOOT EXAM  02/25/2018  . HEMOGLOBIN A1C  05/25/2018  . OPHTHALMOLOGY EXAM  07/12/2018  . TETANUS/TDAP  12/03/2026  . PNA vac Low Risk Adult  Completed    Allergies  Allergen Reactions  . Lactose Intolerance (Gi)   . Sulfonamide Derivatives     REACTION: Itch/Red Spots    Outpatient Encounter Medications as of 01/18/2018  Medication Sig  . Cholecalciferol (VITAMIN D) 2000 UNITS CAPS Take 1 capsule by mouth daily.    Marland Kitchen levothyroxine (SYNTHROID, LEVOTHROID) 25 MCG tablet Take 1 tablet (25 mcg total) by mouth daily before breakfast.  . lisinopril (PRINIVIL,ZESTRIL) 5 MG tablet TAKE ONE TABLET BY MOUTH ONE TIME DAILY  . omeprazole (PRILOSEC) 40 MG capsule Take 1 capsule (40 mg total) by mouth daily.  . rosuvastatin (CRESTOR) 40 MG tablet TAKE ONE TABLET BY MOUTH ONE TIME DAILY   . traZODone (DESYREL) 100 MG tablet take 1 tablet by  mouth at bedtime  . sertraline (ZOLOFT) 25 MG tablet Take 1 tablet (25 mg total) by mouth daily.  . [DISCONTINUED] aspirin 81 MG tablet Take 81 mg by mouth daily.   No facility-administered encounter medications on file as of 01/18/2018.     Review of Systems  Constitutional: Negative for chills, fever and malaise/fatigue.  HENT: Positive for hearing loss (hearing aid). Negative for congestion.        Hearing aid  Eyes:       Glasses; mild retinopathy  Respiratory: Negative for cough and shortness of breath.   Cardiovascular: Negative for chest pain, palpitations, orthopnea, leg swelling and PND.  Gastrointestinal: Negative for abdominal pain, blood in stool, constipation and melena.       Prior acid indigestion, but none in years  Genitourinary: Negative for dysuria.  Musculoskeletal: Positive for back pain. Negative for falls.  Skin: Negative for itching and rash.       Skin cancer removed right hand, Dr. Sarajane Jews  Neurological: Negative for dizziness and loss of consciousness.  Endo/Heme/Allergies: Bruises/bleeds easily.       Diabetes, retinopathy  Psychiatric/Behavioral: Positive for memory loss. The patient has insomnia.        Mood swings    Vitals:   01/18/18 1002  BP: 122/62  Pulse: (!) 54  Temp: 98.7 F (37.1 C)  TempSrc: Oral  SpO2: 97%  Weight: 197 lb (89.4 kg)  Height: 6' (1.829 m)   Body mass index is 26.72 kg/m. Physical Exam  Constitutional: He is oriented to person, place, and time. He appears well-developed and well-nourished. No distress.  HENT:  Head: Normocephalic and atraumatic.  Right Ear: External ear normal.  Left Ear: External ear normal.  Nose: Nose normal.  Mouth/Throat: Oropharynx is clear and moist. No oropharyngeal exudate.  Bilateral hearing aids  Eyes: Pupils are equal, round, and reactive to light. Conjunctivae and EOM are normal.  glasses  Neck: Neck supple. No JVD present. No tracheal deviation present. No thyromegaly present.    Cardiovascular: Normal rate, regular rhythm, normal heart sounds and intact distal pulses.  Pulmonary/Chest: Effort normal and breath sounds normal. No respiratory distress.  Abdominal: Soft. Bowel sounds are normal. He exhibits no distension and no mass. There is no tenderness. There is no rebound and no guarding.  Musculoskeletal: Normal range of motion.  Ambulates without assistive device  Lymphadenopathy:    He has no cervical adenopathy.  Neurological: He is alert and oriented to person, place, and time. He displays normal reflexes. No cranial nerve deficit or sensory deficit. He exhibits normal muscle tone. Coordination normal.  Skin: Skin is warm and dry. Capillary refill takes less than 2 seconds.  Dressings wrapped around right hand from skin cancer surgery  Psychiatric: He has a normal mood and affect.    Labs reviewed: Basic Metabolic Panel: Recent Labs    04/06/17 1112 11/22/17 1003  NA 138 138  K 4.8 4.6  CL 107 106  CO2 27 26  GLUCOSE 105* 183*  BUN 38* 41*  CREATININE 1.84* 1.89*  CALCIUM 9.5 8.8   Liver Function Tests: No results for input(s): AST, ALT, ALKPHOS, BILITOT, PROT, ALBUMIN in the last 8760 hours. No results for input(s): LIPASE, AMYLASE in the last 8760 hours. No results for input(s): AMMONIA in the last 8760 hours. CBC: Recent Labs    04/06/17 1112 11/22/17 1003  WBC 8.5 8.9  NEUTROABS 4.8 5.5  HGB 13.3 12.8*  HCT 39.8 37.5*  MCV 96.3 93.9  PLT 106.0* 116.0*   Cardiac Enzymes: No results for input(s): CKTOTAL, CKMB, CKMBINDEX, TROPONINI in the last 8760 hours. BNP: Invalid input(s): POCBNP Lab Results  Component Value Date   HGBA1C 6.0 (A) 11/22/2017   Lab Results  Component Value Date   TSH 4.60 (H) 11/22/2017   Lab Results  Component Value Date   VITAMINB12 568 04/04/2012   Lab Results  Component Value Date   FOLATE 19.4 04/08/2010   Lab Results  Component Value Date   IRON 124 04/06/2017   TIBC 299 04/08/2010    FERRITIN 192 04/08/2010   Assessment/Plan 1. Depression, major, single episode, mild ([place]) - seems he is struggling to adjust to the aging process, move, downsizing and some memory loss he's experiencing is frustrating--he's having mood swings that he and his wife are noting - sertraline (ZOLOFT) 25 MG tablet; Take 1 tablet (25 mg total) by mouth daily.  Dispense: 30 tablet; Refill: 5  2. Hypothyroidism, unspecified type -cont levothyroxine; last tsh slightly high depending on lab, will monitor  3. Controlled type 2 diabetes mellitus with both eyes affected by mild nonproliferative retinopathy without macular edema, with long-term current use of insulin (HCC) -has been diet-controlled, cont this, encouraged him to walk and do the balance class, be careful with the desserts and starchy carbs on the menu here  4. Type 2 diabetes mellitus with stage 3 chronic kidney disease, without long-term current use of insulin (HCC) -Avoid nephrotoxic agents like nsaids, dose adjust renally excreted meds, hydrate.  5. Memory loss -notable over past year or so in midst of moving twice, will tx mood first and see if he has some  improvement; will monitor MMSE -unclear when last AWV was done--I don't see an encounter labeled this way in Dr. Cordelia Pen notes  6. Bradycardia -HR runs 49-62 over past year - asymptomatic, monitor  7.  Prostate Ca -s/p resection -stable, not on any related meds Lab Results  Component Value Date   PSA 0.00 (L) 03/31/2016   PSA 0.00 (L) 11/08/2013   PSA 0.00 (L) 03/28/2013   8.  Low back pain -not requiring medications, bothersome mostly with positional changes -monitor, and if becomes more of an issue and interferes with day to day routine and exercise, would get imaging  Labs/tests ordered:  B12/folate due to memory loss; rest up to date F/u on 05/24/2018  Alainna Stawicki L. Ikran Patman, D.O. [facility] [address],  [place] [address] Cell Phone (Mon-Fri 8am-5pm):  [phone] On Call:  [phone] & follow prompts after 5pm & weekends Office Phone:  [phone] Office Fax:  [phone]

## 2018-01-24 ENCOUNTER — Encounter: Payer: Self-pay | Admitting: Internal Medicine

## 2018-01-24 DIAGNOSIS — F320 Major depressive disorder, single episode, mild: Secondary | ICD-10-CM | POA: Insufficient documentation

## 2018-01-24 DIAGNOSIS — Z794 Long term (current) use of insulin: Secondary | ICD-10-CM | POA: Insufficient documentation

## 2018-01-24 DIAGNOSIS — E113293 Type 2 diabetes mellitus with mild nonproliferative diabetic retinopathy without macular edema, bilateral: Secondary | ICD-10-CM | POA: Insufficient documentation

## 2018-01-24 NOTE — ACP (Advance Care Planning) (Signed)
Pt reports having DNR, living will and HCPOA in freezer capsule at [place].  CMA obtaining a copy for scanning.

## 2018-02-15 ENCOUNTER — Other Ambulatory Visit: Payer: Self-pay | Admitting: Endocrinology

## 2018-02-17 ENCOUNTER — Other Ambulatory Visit: Payer: Self-pay

## 2018-02-17 MED ORDER — LISINOPRIL 5 MG PO TABS: 5.0000 mg | ORAL_TABLET | Freq: Every day | ORAL | 2 refills | Status: DC

## 2018-02-21 ENCOUNTER — Other Ambulatory Visit: Payer: Self-pay | Admitting: *Deleted

## 2018-02-21 NOTE — Telephone Encounter (Signed)
Spoke with patient and advised that [employer] would send [place] refill requests and we would fill. Pt does not need any refills now.

## 2018-02-21 NOTE — Telephone Encounter (Signed)
Patient called and left message on Clinical Intake requesting how to get medications refilled after just transferring from another provider.   Tried calling patient back and LMOM to return call regarding which Rx's he needed and the pharmacy and I would send them in for him. Awaiting call.

## 2018-02-23 ENCOUNTER — Ambulatory Visit
Admission: RE | Admit: 2018-02-23 | Discharge: 2018-02-23 | Disposition: A | Discharge status: Home / Self Care | Payer: Medicare Other | Source: Ambulatory Visit | Attending: Family | Admitting: Family

## 2018-02-23 ENCOUNTER — Ambulatory Visit (INDEPENDENT_AMBULATORY_CARE_PROVIDER_SITE_OTHER): Payer: Medicare Other | Admitting: Family

## 2018-02-23 ENCOUNTER — Encounter: Payer: Self-pay | Admitting: Family

## 2018-02-23 VITALS — BP 112/80 | HR 60 | Temp 98.1°F | Resp 16 | Ht 72.0 in | Wt 194.8 lb

## 2018-02-23 DIAGNOSIS — K5900 Constipation, unspecified: Secondary | ICD-10-CM

## 2018-02-23 DIAGNOSIS — R1912 Hyperactive bowel sounds: Secondary | ICD-10-CM

## 2018-02-23 NOTE — Patient Instructions (Signed)

## 2018-02-23 NOTE — Progress Notes (Signed)
Provider: Dinah Ngetich FNP-C  Gayland Curry, DO  Patient Care Team: Gayland Curry, DO as PCP - General (Geriatric Medicine) Nobie Putnam, MD (Hematology and Oncology) Sherren Mocha, MD (Cardiology) Martinique, Amy, MD as Consulting Physician (Dermatology)  Extended Emergency Contact Information Primary Emergency Contact: Rashid,Simone Address: [address]          [place], [place] [address] Johnnette Litter of [place] Phone: [phone] Mobile Phone: [phone] Relation: Spouse   Goals of care: Advanced Directive information Advanced Directives 02/23/2018  Does Patient Have a Medical Advance Directive? No  Type of Advance Directive -  Does patient want to make changes to medical advance directive? -  Copy of [place] in Chart? -  Would patient like information on creating a medical advance directive? -     Chief Complaint  Patient presents with  . Acute Visit    gas and constipation    HPI:  Pt is a 84 y.o. male seen today at [facility] office for an acute visit for evaluation of constipation.He states few weeks ago he was straining to have a bowel movement.He took Miralax that he was instructed to take in march.Later he was a party and had apple pie that made him use the bathroom whole night.This week he had an ice cream which made him also have explosive loose stool described as sometimes small amounts.He states some times goes beyond three days without a bowel movement.he states his abdomen has been hard.He states eats vegetables in his diet.He denies any abdominal pain,diarrhea,nausea or vomiting.He also denies any fever or chills.   Past Medical History:  Diagnosis Date  . BRADYCARDIA 11/13/2008  . Cholelithiasis   . COLONIC POLYPS, HX OF 02/04/2007  . DEPRESSION 02/04/2007  . DIABETES MELLITUS, TYPE II 02/04/2007  . DIVERTICULOSIS, COLON 02/04/2007  . FOREIGN BODY, ASPIRATION 12/04/2007  . GERD (gastroesophageal reflux disease)   . GLAUCOMA  03/08/2009  . Hepatitis A 1963  . HYPERLIPIDEMIA 12/04/2007  . HYPERTENSION 02/04/2007  . OBSTRUCTIVE SLEEP APNEA 11/05/2009   -PSG 01/05/10 RDI 11, PLMI 56  . OSTEOARTHRITIS 02/04/2007  . Peptic stricture of esophagus   . PROSTATE CANCER 1997  . RENAL INSUFFICIENCY 11/13/2008  . SYNCOPE 12/31/2009  . THROMBOCYTOPENIA 04/17/2008  . THYROID NODULE, RIGHT 11/13/2008  . UNSPECIFIED ANEMIA 11/07/2007   Past Surgical History:  Procedure Laterality Date  . Heart Cartherization  2000  . PROSTATECTOMY  1997  . TONSILLECTOMY  1940's    Allergies  Allergen Reactions  . Lactose Intolerance (Gi)   . Sulfonamide Derivatives     REACTION: Itch/Red Spots    Outpatient Encounter Medications as of 02/23/2018  Medication Sig  . Cholecalciferol (VITAMIN D) 2000 UNITS CAPS Take 1 capsule by mouth daily.    . ferrous sulfate 325 (65 FE) MG EC tablet Take 325 mg by mouth daily.  Marland Kitchen levothyroxine (SYNTHROID, LEVOTHROID) 25 MCG tablet Take 1 tablet (25 mcg total) by mouth daily before breakfast.  . lisinopril (PRINIVIL,ZESTRIL) 5 MG tablet TAKE ONE TABLET BY MOUTH ONE TIME DAILY   . rosuvastatin (CRESTOR) 40 MG tablet TAKE ONE TABLET BY MOUTH ONE TIME DAILY   . sertraline (ZOLOFT) 25 MG tablet Take 1 tablet (25 mg total) by mouth daily.  . traZODone (DESYREL) 100 MG tablet take 1 tablet by mouth at bedtime  . [DISCONTINUED] lisinopril (PRINIVIL,ZESTRIL) 5 MG tablet Take 1 tablet (5 mg total) by mouth daily.  . [DISCONTINUED] omeprazole (PRILOSEC) 40 MG capsule  Take 1 capsule (40 mg total) by mouth daily. (Patient not taking: Reported on 02/23/2018)   No facility-administered encounter medications on file as of 02/23/2018.     Review of Systems  Constitutional: Negative for appetite change, chills, fatigue and fever.  Respiratory: Negative for cough, chest tightness, shortness of breath and wheezing.   Cardiovascular: Negative for chest pain, palpitations and leg swelling.  Gastrointestinal: Positive for  constipation. Negative for abdominal distention, abdominal pain, blood in stool, diarrhea, nausea, rectal pain and vomiting.  Endocrine: Negative for cold intolerance and heat intolerance.  Musculoskeletal:       Chronic back pain   Skin: Negative for color change, pallor and rash.  Psychiatric/Behavioral: Negative for agitation and sleep disturbance. The patient is not nervous/anxious.     Immunization History  Administered Date(s) Administered  . Influenza Split 04/05/2011, 04/04/2012  . Influenza Whole 03/27/2008, 04/15/2009, 03/20/2010  . Influenza, High Dose Seasonal PF 03/31/2016, 04/06/2017  . Influenza,inj,Quad PF,6+ Mos 04/03/2013, 03/14/2015  . Pneumococcal Conjugate-13 03/14/2015  . Pneumococcal Polysaccharide-23 06/29/1995  . Td 11/13/2004  . Tdap 12/02/2016  . Zoster 06/28/2004  . Zoster Recombinat (Shingrix) 12/30/2017   Pertinent  Health Maintenance Due  Topic Date Due  . INFLUENZA VACCINE  01/26/2018  . FOOT EXAM  02/25/2018  . HEMOGLOBIN A1C  05/25/2018  . OPHTHALMOLOGY EXAM  07/12/2018  . PNA vac Low Risk Adult  Completed   Fall Risk  01/18/2018 11/12/2015 04/16/2015  Falls in the past year? No No No  Risk for fall due to : - Other (Comment) Other (Comment)    Vitals:   02/23/18 0828  BP: 112/80  Pulse: 60  Resp: 16  Temp: 98.1 F (36.7 C)  TempSrc: Oral  SpO2: 98%  Weight: 194 lb 12.8 oz (88.4 kg)  Height: 6' (1.829 m)   Body mass index is 26.42 kg/m. Physical Exam  Constitutional: He appears well-developed and well-nourished. No distress.  Elderly in no acute distress   HENT:  Head: Normocephalic.  Mouth/Throat: Oropharynx is clear and moist. No oropharyngeal exudate.  Hearing aids in place   Eyes: Pupils are equal, round, and reactive to light. Conjunctivae are normal. Right eye exhibits no discharge. Left eye exhibits no discharge. No scleral icterus.  Neck: Normal range of motion. No JVD present. No thyromegaly present.  Cardiovascular:  Normal rate, regular rhythm, normal heart sounds and intact distal pulses. Exam reveals no gallop and no friction rub.  No murmur heard. Pulmonary/Chest: Effort normal and breath sounds normal. No respiratory distress. He has no wheezes. He has no rales.  Abdominal: Soft. He exhibits no mass. There is no tenderness. There is no rebound and no guarding.  Hyperactive bowel sounds X 4 quadrant   Lymphadenopathy:    He has no cervical adenopathy.  Vitals reviewed.   Labs reviewed: Recent Labs    04/06/17 1112 11/22/17 1003  NA 138 138  K 4.8 4.6  CL 107 106  CO2 27 26  GLUCOSE 105* 183*  BUN 38* 41*  CREATININE 1.84* 1.89*  CALCIUM 9.5 8.8    Recent Labs    04/06/17 1112 11/22/17 1003  WBC 8.5 8.9  NEUTROABS 4.8 5.5  HGB 13.3 12.8*  HCT 39.8 37.5*  MCV 96.3 93.9  PLT 106.0* 116.0*   Lab Results  Component Value Date   TSH 4.60 (H) 11/22/2017   Lab Results  Component Value Date   HGBA1C 6.0 (A) 11/22/2017   Lab Results  Component Value Date   CHOL  119 11/22/2017   HDL 29.10 (L) 11/22/2017   LDLCALC 57 11/22/2017   TRIG 163.0 (H) 11/22/2017   CHOLHDL 4 11/22/2017    Significant Diagnostic Results in last 30 days:  No results found.  Assessment/Plan 1. Constipation, unspecified constipation type Had stopped taking miralax but recently restarted.Has had large stool sometimes loose even without taking miralax.continue to inco-operate vegetables in the diet.will obtain KUB to rule out possible obstruction.  - DG Abd 1 View; Future  2. Hyperactive bowel sounds Abdomen round,non-tender to palpation very hyperactive bowel sounds x 4 quadrant noted.KUB as above.  - DG Abd 1 View; Future  Family/ staff Communication: Reviewed plan of care with patient  Labs/tests ordered: KUB to rule out obstruction.   Addendum: KUB results 02/23/2018  Impression: Moderate stool without obstruction noted.   Sandrea Hughs, NP

## 2018-02-28 ENCOUNTER — Encounter (INDEPENDENT_AMBULATORY_CARE_PROVIDER_SITE_OTHER): Payer: Self-pay

## 2018-03-07 ENCOUNTER — Ambulatory Visit (INDEPENDENT_AMBULATORY_CARE_PROVIDER_SITE_OTHER): Payer: Medicare Other | Admitting: Neurology

## 2018-03-07 ENCOUNTER — Encounter: Payer: Self-pay | Admitting: Neurology

## 2018-03-07 ENCOUNTER — Telehealth: Payer: Self-pay | Admitting: Neurology

## 2018-03-07 VITALS — BP 141/68 | HR 58 | Ht 72.0 in | Wt 195.5 lb

## 2018-03-07 DIAGNOSIS — G3184 Mild cognitive impairment, so stated: Secondary | ICD-10-CM

## 2018-03-07 DIAGNOSIS — R413 Other amnesia: Secondary | ICD-10-CM

## 2018-03-07 NOTE — Progress Notes (Signed)
PATIENT: [name] DOB: [birth year]  Chief Complaint  Patient presents with  . Memory Loss    MMSE 29/30 - 13 animals.  He is her with his wife, [name], to have his memory loss further evaluated.   Marland Kitchen PCP    Gayland Curry, DO     HISTORICAL  OSHEN WLODARCZYK is a 84 year old male, seen in request by his primary care physician Dr. Mariea Clonts, Jonelle Sidle for evaluation of memory loss, initial evaluation was on March 07, 2018.  I have reviewed and summarized the referring note from the referring physician.  He had a past medical history of hypothyroidism, on supplement, hyperlipidemia, hypertension.  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  He plays bridges regularly, does mild balance exercise 5 days a week, still able to keep his book imbalance  Family history of memory loss  REVIEW OF SYSTEMS: Full 14 system review of systems performed and notable only for hearing loss, snoring, constipation, easy bruising, feeling cold, anxiety, memory loss, confusion All other review of systems were negative.  ALLERGIES: Allergies  Allergen Reactions  . Lactose Intolerance (Gi)   . Sulfonamide Derivatives     REACTION: Itch/Red Spots    HOME MEDICATIONS: Current Outpatient Medications  Medication Sig Dispense Refill  . Cholecalciferol (VITAMIN D) 2000 UNITS CAPS Take 1 capsule by mouth daily.      . ferrous sulfate 325 (65 FE) MG EC tablet Take 325 mg by mouth daily.    Marland Kitchen levothyroxine (SYNTHROID, LEVOTHROID) 25 MCG tablet Take 1 tablet (25 mcg total) by mouth daily before breakfast. 90 tablet 3  . lisinopril (PRINIVIL,ZESTRIL) 5 MG tablet TAKE ONE TABLET BY MOUTH ONE TIME DAILY  90 tablet 0  . polyethylene glycol (MIRALAX / GLYCOLAX) packet Take 17 g by mouth daily.    .  rosuvastatin (CRESTOR) 40 MG tablet TAKE ONE TABLET BY MOUTH ONE TIME DAILY  90 tablet 0  . sertraline (ZOLOFT) 25 MG tablet Take 1 tablet (25 mg total) by mouth daily. 30 tablet 5  . traZODone (DESYREL) 100 MG tablet take 1 tablet by mouth at bedtime 30 tablet 0   No current facility-administered medications for this visit.     PAST MEDICAL HISTORY: Past Medical History:  Diagnosis Date  . BRADYCARDIA 11/13/2008  . Cholelithiasis   . COLONIC POLYPS, HX OF 02/04/2007  . DEPRESSION 02/04/2007  . DIABETES MELLITUS, TYPE II 02/04/2007  . DIVERTICULOSIS, COLON 02/04/2007  . FOREIGN BODY, ASPIRATION 12/04/2007  . GERD (gastroesophageal reflux disease)   . GLAUCOMA 03/08/2009  . Hepatitis A 1963  . HYPERLIPIDEMIA 12/04/2007  . HYPERTENSION 02/04/2007  . Memory loss   . OBSTRUCTIVE SLEEP APNEA 11/05/2009   -PSG 01/05/10 RDI 11, PLMI 56  . OSTEOARTHRITIS 02/04/2007  . Peptic stricture of esophagus   . PROSTATE CANCER 1997  . RENAL INSUFFICIENCY 11/13/2008  . SYNCOPE 12/31/2009  . THROMBOCYTOPENIA 04/17/2008  . THYROID NODULE, RIGHT 11/13/2008  . UNSPECIFIED ANEMIA 11/07/2007    PAST SURGICAL HISTORY: Past Surgical History:  Procedure Laterality Date  . Heart Cartherization  2000  . PROSTATECTOMY  1997  . TONSILLECTOMY  1940's    FAMILY HISTORY: Family History  Problem Relation Age of Onset  . Kidney disease Father        Bright's  Disease - died at age 71  . Other Mother        no signifincant health problems - died at age 71  . Heart disease Neg Hx        No FH of Coronary Artery Disease  . Cancer Neg Hx     SOCIAL HISTORY: Social History   Socioeconomic History  . Marital status: Married    Spouse name: Not on file  . Number of children: 3  . Years of education: college  . Highest education level: Bachelor's degree (e.g., BA, AB, BS)  Occupational History  . 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 Financial resource strain: Not on  file  . Food insecurity:    Worry: Not on file    Inability: Not on file  . Transportation needs:    Medical: Not on file    Non-medical: Not on file  Tobacco Use  . Smoking status: Former Smoker    Types: Cigarettes, Pipe    Last attempt to quit: 07/08/1993    Years since quitting: 24.6  . Smokeless tobacco: Never Used  Substance and Sexual Activity  . Alcohol use: Yes    Comment: minimal  . Drug use: No  . Sexual activity: Not on file  Lifestyle  . Physical activity:    Days per week: Not on file    Minutes per session: Not on file  . Stress: Not on file  Relationships  . Social connections:    Talks on phone: Not on file    Gets together: Not on file    Attends religious service: Not on file    Active member of club or organization: Not on file    Attends meetings of clubs or organizations: Not on file    Relationship status: Not on file  . Intimate partner violence:    Fear of current or ex partner: Not on file    Emotionally abused: Not on file    Physically abused: Not on file    Forced sexual activity: Not on file  Other Topics Concern  . Not on file  Social History Narrative   He played tennis 3x a week before moving to [place].   Right-handed.   2 cups caffeine per day.        PHYSICAL EXAM   Vitals:   03/07/18 1411  BP: (!) 141/68  Pulse: (!) 58  Weight: 195 lb 8 oz (88.7 kg)  Height: 6' (1.829 m)    Not recorded      Body mass index is 26.51 kg/m.  PHYSICAL EXAMNIATION:  Gen: NAD, conversant, well nourised, obese, well groomed                     Cardiovascular: Regular rate rhythm, no peripheral edema, warm, nontender. Eyes: Conjunctivae clear without exudates or hemorrhage Neck: Supple, no carotid bruits. Pulmonary: Clear to auscultation bilaterally   NEUROLOGICAL EXAM: MMSE - Mini Mental State Exam 03/07/2018  Orientation to time 5  Orientation to Place 5  Registration 3  Attention/ Calculation 5  Recall 2  Language- name 2  objects 2  Language- repeat 1  Language- follow 3 step command 3  Language- read & follow direction 1  Write a sentence 1  Copy design 1  Total score 29     CRANIAL NERVES: CN II: Visual fields are full to confrontation. Fundoscopic exam is normal with sharp discs and no vascular changes. Pupils  are round equal and briskly reactive to light. CN III, IV, VI: extraocular movement are normal. No ptosis. CN V: Facial sensation is intact to pinprick in all 3 divisions bilaterally. Corneal responses are intact.  CN VII: Face is symmetric with normal eye closure and smile. CN VIII: Hearing is normal to rubbing fingers CN IX, X: Palate elevates symmetrically. Phonation is normal. CN XI: Head turning and shoulder shrug are intact CN XII: Tongue is midline with normal movements and no atrophy.  MOTOR: There is no pronator drift of out-stretched arms. Muscle bulk and tone are normal. Muscle strength is normal.  REFLEXES: Reflexes are 2+ and symmetric at the biceps, triceps, knees, and ankles. Plantar responses are flexor.  SENSORY: Intact to light touch, pinprick, positional sensation and vibratory sensation are intact in fingers and toes.  COORDINATION: Rapid alternating movements and fine finger movements are intact. There is no dysmetria on finger-to-nose and heel-knee-shin.    GAIT/STANCE: He needs pushed up to get up from seated position, cautious, Romberg is absent.   DIAGNOSTIC DATA (LABS, IMAGING, TESTING) - I reviewed patient records, labs, notes, testing and imaging myself where available.   ASSESSMENT AND PLAN  JEBIDIAH BAGGERLY is a 84 y.o. male    Mild Cognitive impairment  Mini-Mental status examination 29/30  MRI of the brain  Lab check b12 level  Continue moderate exercise  Marcial Pacas, M.D. Ph.D.  [facility] [address] [place], [place] [address] Ph: [phone] Fax: [phone]  CC:  Gayland Curry, DO

## 2018-03-07 NOTE — Telephone Encounter (Signed)
Medicare/bcbs supp order sent to GI. No auth they will reach out to the pt to schedule.

## 2018-03-08 ENCOUNTER — Encounter: Payer: Self-pay | Admitting: Internal Medicine

## 2018-03-08 ENCOUNTER — Ambulatory Visit (INDEPENDENT_AMBULATORY_CARE_PROVIDER_SITE_OTHER): Payer: Medicare Other | Admitting: Internal Medicine

## 2018-03-08 VITALS — BP 118/74 | HR 65 | Temp 98.0°F | Ht 72.0 in | Wt 194.4 lb

## 2018-03-08 DIAGNOSIS — K5732 Diverticulitis of large intestine without perforation or abscess without bleeding: Secondary | ICD-10-CM

## 2018-03-08 DIAGNOSIS — K5730 Diverticulosis of large intestine without perforation or abscess without bleeding: Secondary | ICD-10-CM | POA: Diagnosis not present

## 2018-03-08 DIAGNOSIS — R195 Other fecal abnormalities: Secondary | ICD-10-CM | POA: Diagnosis not present

## 2018-03-08 DIAGNOSIS — K439 Ventral hernia without obstruction or gangrene: Secondary | ICD-10-CM

## 2018-03-08 DIAGNOSIS — K5900 Constipation, unspecified: Secondary | ICD-10-CM

## 2018-03-08 LAB — BASIC METABOLIC PANEL WITH GFR
BUN / CREAT RATIO: 19 (calc) (ref 6–22)
BUN: 34 mg/dL — ABNORMAL HIGH (ref 7–25)
CHLORIDE: 106 mmol/L (ref 98–110)
CO2: 28 mmol/L (ref 20–32)
Calcium: 9.4 mg/dL (ref 8.6–10.3)
Creat: 1.82 mg/dL — ABNORMAL HIGH (ref 0.70–1.11)
GFR, Est African American: 38 mL/min/{1.73_m2} — ABNORMAL LOW (ref >=60)
GFR, Est Non African American: 32 mL/min/{1.73_m2} — ABNORMAL LOW (ref >=60)
GLUCOSE: 81 mg/dL (ref 65–139)
POTASSIUM: 5.1 mmol/L (ref 3.5–5.3)
SODIUM: 140 mmol/L (ref 135–146)

## 2018-03-08 LAB — CBC WITH DIFFERENTIAL/PLATELET
BASOS ABS: 19 {cells}/uL (ref 0–200)
BASOS PCT: 0.2 %
EOS ABS: 0 {cells}/uL — AB (ref 15–500)
Eosinophils Relative: 0 %
HEMATOCRIT: 38.3 % — AB (ref 38.5–50.0)
HEMOGLOBIN: 13.1 g/dL — AB (ref 13.2–17.1)
LYMPHS ABS: 3182 {cells}/uL (ref 850–3900)
MCH: 32.2 pg (ref 27.0–33.0)
MCHC: 34.2 g/dL (ref 32.0–36.0)
MCV: 94.1 fL (ref 80.0–100.0)
MPV: 13.3 fL — AB (ref 7.5–12.5)
Monocytes Relative: 10.1 %
NEUTROS ABS: 5519 {cells}/uL (ref 1500–7800)
Neutrophils Relative %: 56.9 %
Platelets: 120 10*3/uL — ABNORMAL LOW (ref 140–400)
RBC: 4.07 10*6/uL — ABNORMAL LOW (ref 4.20–5.80)
RDW: 12.8 % (ref 11.0–15.0)
Total Lymphocyte: 32.8 %
WBC: 9.7 10*3/uL (ref 3.8–10.8)
WBCMIX: 980 {cells}/uL — AB (ref 200–950)

## 2018-03-08 LAB — VITAMIN B12: VITAMIN B 12: 558 pg/mL (ref 232–1245)

## 2018-03-08 MED ORDER — AMOXICILLIN-POT CLAVULANATE 875-125 MG PO TABS: 1.0000 | ORAL_TABLET | Freq: Two times a day (BID) | ORAL | 0 refills | Status: DC

## 2018-03-08 MED ORDER — SACCHAROMYCES BOULARDII 250 MG PO CAPS: 250.0000 mg | ORAL_CAPSULE | Freq: Two times a day (BID) | ORAL | 0 refills | Status: DC

## 2018-03-08 NOTE — Patient Instructions (Addendum)
START AUGMENTIN 875MG  every 12 hours x 10 days for colon infection  TAKE PROBIOTIC 2 TIMES DAILY WHILE ON ANTIBIOTIC TO KEEP COLON HEALTHY  Will call with GI referral  Follow up with Dr Mariea Clonts as scheduled or sooner if need be.   Ventral Hernia A ventral hernia is a bulge of tissue from inside the abdomen that pushes through a weak area of the muscles that form the front wall of the abdomen. The tissues inside the abdomen are inside a sac (peritoneum). These tissues include the small intestine, large intestine, and the fatty tissue that covers the intestines (omentum). Sometimes, the bulge that forms a hernia contains intestines. Other hernias contain only fat. Ventral hernias do not go away without surgical treatment. There are several types of ventral hernias. You may have:  A hernia at an incision site from previous abdominal surgery (incisional hernia).  A hernia just above the belly button (epigastric hernia), or at the belly button (umbilical hernia). These types of hernias can develop from heavy lifting or straining.  A hernia that comes and goes (reducible hernia). It may be visible only when you lift or strain. This type of hernia can be pushed back into the abdomen (reduced).  A hernia that traps abdominal tissue inside the hernia (incarcerated hernia). This type of hernia does not reduce.  A hernia that cuts off blood flow to the tissues inside the hernia (strangulated hernia). The tissues can start to die if this happens. This is a very painful bulge that cannot be reduced. A strangulated hernia is a medical emergency.  What are the causes? This condition is caused by abdominal tissue putting pressure on an area of weakness in the abdominal muscles. What increases the risk? The following factors may make you more likely to develop this condition:  Being male.  Being 59 or older.  Being overweight or obese.  Having had previous abdominal surgery, especially if there was an  infection after surgery.  Having had an injury to the abdominal wall.  Having had several pregnancies.  Having a buildup of fluid inside the abdomen (ascites).  What are the signs or symptoms? The only symptom of a ventral hernia may be a painless bulge in the abdomen. A reducible hernia may be visible only when you strain, cough, or lift. Other symptoms may include:  Dull pain.  A feeling of pressure.  Signs and symptoms of a strangulated hernia may include:  Increasing pain.  Nausea and vomiting.  Pain when pressing on the hernia.  The skin over the hernia turning red or purple.  Constipation.  Blood in the stool (feces).  How is this diagnosed? This condition may be diagnosed based on:  Your symptoms.  Your medical history.  A physical exam. You may be asked to cough or strain while standing. These actions increase the pressure inside your abdomen and force the hernia through the opening in your muscles. Your health care provider may try to reduce the hernia by pressing on it.  Imaging studies, such as an ultrasound or CT scan.  How is this treated? This condition is treated with surgery. If you have a strangulated hernia, surgery is done as soon as possible. If your hernia is small and not incarcerated, you may be asked to lose some weight before surgery. Follow these instructions at home:  Follow instructions from your health care provider about eating or drinking restrictions.  If you are overweight, your health care provider may recommend that you increase your  activity level and eat a healthier diet.  Do not lift anything that is heavier than 10 lb (4.5 kg).  Return to your normal activities as told by your health care provider. Ask your health care provider what activities are safe for you. You may need to avoid activities that increase pressure on your hernia.  Take over-the-counter and prescription medicines only as told by your health care  provider.  Keep all follow-up visits as told by your health care provider. This is important. Contact a health care provider if:  Your hernia gets larger.  Your hernia becomes painful. Get help right away if:  Your hernia becomes increasingly painful.  You have pain along with any of the following: ? Changes in skin color in the area of the hernia. ? Nausea. ? Vomiting. ? Fever. Summary  A ventral hernia is a bulge of tissue from inside the abdomen that pushes through a weak area of the muscles that form the front wall of the abdomen.  This condition is treated with surgery, which may be urgent depending on your hernia.  Do not lift anything that is heavier than 10 lb (4.5 kg), and follow activity instructions from your health care provider. This information is not intended to replace advice given to you by your health care provider. Make sure you discuss any questions you have with your health care provider. Document Released: 05/31/2012 Document Revised: 01/30/2016 Document Reviewed: 01/30/2016 Elsevier Interactive Patient Education  2018 [employer].  Diverticulitis Diverticulitis is when small pockets in your large intestine (colon) get infected or swollen. This causes stomach pain and watery poop (diarrhea). These pouches are called diverticula. They form in people who have a condition called diverticulosis. Follow these instructions at home: Medicines  Take over-the-counter and prescription medicines only as told by your doctor. These include: ? Antibiotics. ? Pain medicines. ? Fiber pills. ? Probiotics. ? Stool softeners.  Do not drive or use heavy machinery while taking prescription pain medicine.  If you were prescribed an antibiotic, take it as told. Do not stop taking it even if you feel better. General instructions  Follow a diet as told by your doctor.  When you feel better, your doctor may tell you to change your diet. You may need to eat a lot of  fiber. Fiber makes it easier to poop (have bowel movements). Healthy foods with fiber include: ? Berries. ? Beans. ? Lentils. ? Green vegetables.  Exercise 3 or more times a week. Aim for 30 minutes each time. Exercise enough to sweat and make your heart beat faster.  Keep all follow-up visits as told. This is important. You may need to have an exam of the large intestine. This is called a colonoscopy. Contact a doctor if:  Your pain does not get better.  You have a hard time eating or drinking.  You are not pooping like normal. Get help right away if:  Your pain gets worse.  Your problems do not get better.  Your problems get worse very fast.  You have a fever.  You throw up (vomit) more than one time.  You have poop that is: ? Bloody. ? Black. ? Tarry. Summary  Diverticulitis is when small pockets in your large intestine (colon) get infected or swollen.  Take medicines only as told by your doctor.  Follow a diet as told by your doctor. This information is not intended to replace advice given to you by your health care provider. Make sure you discuss any  questions you have with your health care provider. Document Released: 12/01/2007 Document Revised: 07/01/2016 Document Reviewed: 07/01/2016 Elsevier Interactive Patient Education  2017 [employer].

## 2018-03-08 NOTE — Progress Notes (Signed)
Patient ID: [name], male   DOB: August 18, 1927, 84 y.o.   MRN: [id number]   [facility] OFFICE  Provider: DR Arletha Grippe  Code Status:  Goals of Care:  Advanced Directives 03/08/2018  Does Patient Have a Medical Advance Directive? No  Type of Advance Directive -  Does patient want to make changes to medical advance directive? -  Copy of [place] in Chart? -  Would patient like information on creating a medical advance directive? -     Chief Complaint  Patient presents with  . Acute Visit    Pt is being seen due to constipation and gas pain for several months.   . Audit C Screening    score of 2    HPI: Patient is a 84 y.o. male seen today for an acute visit for constipation. He takes miralax daily. He is also on ferrous sulfate for iron deficiency. He was seen by Dinah 02/23/18 and KUB revealed moderate stool wo obstruction. He is c/a 1-2 times per day of "explosive gas" immediately followed by urgency and watery stool with "bits of feces". No abdominal pain, back pain, no mucous/blood noted, no N/V, f/c. Sx's began after consuming large piece of rich chocolate cake in early August 2019. He was up all night at that time. He then had some cantaloupe 2 weeks later and similar sx's occurred. He has 2 additional episodes since then but not as severe. He was eating ice cream when those occurred.  Last colonoscopy 2009 revealed no polyps (but has hx adenomatous polyps) and diverticulosis of colon (ascending>>sigmoid).  He has a distant hx prostate cancer (>20 yrs ago) with no signs of recurrence  He also has hx cholelithiasis  Past Medical History:  Diagnosis Date  . BRADYCARDIA 11/13/2008  . Cholelithiasis   . COLONIC POLYPS, HX OF 02/04/2007  . DEPRESSION 02/04/2007  . DIABETES MELLITUS, TYPE II 02/04/2007  . DIVERTICULOSIS, COLON 02/04/2007  . FOREIGN BODY, ASPIRATION 12/04/2007  . GERD (gastroesophageal reflux disease)   . GLAUCOMA 03/08/2009  . Hepatitis A 1963  .  HYPERLIPIDEMIA 12/04/2007  . HYPERTENSION 02/04/2007  . Memory loss   . OBSTRUCTIVE SLEEP APNEA 11/05/2009   -PSG 01/05/10 RDI 11, PLMI 56  . OSTEOARTHRITIS 02/04/2007  . Peptic stricture of esophagus   . PROSTATE CANCER 1997  . RENAL INSUFFICIENCY 11/13/2008  . SYNCOPE 12/31/2009  . THROMBOCYTOPENIA 04/17/2008  . THYROID NODULE, RIGHT 11/13/2008  . UNSPECIFIED ANEMIA 11/07/2007    Past Surgical History:  Procedure Laterality Date  . Heart Cartherization  2000  . PROSTATECTOMY  1997  . TONSILLECTOMY  1940's     reports that he quit smoking about 24 years ago. His smoking use included cigarettes and pipe. He has never used smokeless tobacco. He reports that he drinks alcohol. He reports that he does not use drugs. Social History   Socioeconomic History  . Marital status: Married    Spouse name: Not on file  . Number of children: 3  . Years of education: college  . Highest education level: Bachelor's degree (e.g., BA, AB, BS)  Occupational History  . 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 Financial resource strain: Not on file  . Food insecurity:    Worry: Not on file    Inability: Not on file  . Transportation needs:    Medical: Not on file    Non-medical: Not on file  Tobacco Use  . Smoking status:  Former Smoker    Types: Cigarettes, Pipe    Last attempt to quit: 07/08/1993    Years since quitting: 24.6  . Smokeless tobacco: Never Used  Substance and Sexual Activity  . Alcohol use: Yes    Comment: minimal  . Drug use: No  . Sexual activity: Not on file  Lifestyle  . Physical activity:    Days per week: Not on file    Minutes per session: Not on file  . Stress: Not on file  Relationships  . Social connections:    Talks on phone: Not on file    Gets together: Not on file    Attends religious service: Not on file    Active member of club or organization: Not on file    Attends meetings of clubs or organizations: Not on  file    Relationship status: Not on file  . Intimate partner violence:    Fear of current or ex partner: Not on file    Emotionally abused: Not on file    Physically abused: Not on file    Forced sexual activity: Not on file  Other Topics Concern  . Not on file  Social History Narrative   He played tennis 3x a week before moving to [place].   Right-handed.   2 cups caffeine per day.       Family History  Problem Relation Age of Onset  . Kidney disease Father        Bright's Disease - died at age 5  . Other Mother        no signifincant health problems - died at age 75  . Heart disease Neg Hx        No FH of Coronary Artery Disease  . Cancer Neg Hx     Allergies  Allergen Reactions  . Lactose Intolerance (Gi)   . Sulfonamide Derivatives     REACTION: Itch/Red Spots    Outpatient Encounter Medications as of 03/08/2018  Medication Sig  . Cholecalciferol (VITAMIN D) 2000 UNITS CAPS Take 1 capsule by mouth daily.    . ferrous sulfate 325 (65 FE) MG EC tablet Take 325 mg by mouth daily.  Marland Kitchen levothyroxine (SYNTHROID, LEVOTHROID) 25 MCG tablet Take 1 tablet (25 mcg total) by mouth daily before breakfast.  . lisinopril (PRINIVIL,ZESTRIL) 5 MG tablet TAKE ONE TABLET BY MOUTH ONE TIME DAILY   . polyethylene glycol (MIRALAX / GLYCOLAX) packet Take 17 g by mouth daily.  . rosuvastatin (CRESTOR) 40 MG tablet TAKE ONE TABLET BY MOUTH ONE TIME DAILY   . sertraline (ZOLOFT) 25 MG tablet Take 1 tablet (25 mg total) by mouth daily.  . traZODone (DESYREL) 100 MG tablet take 1 tablet by mouth at bedtime   No facility-administered encounter medications on file as of 03/08/2018.     Review of Systems:  Review of Systems  Gastrointestinal: Positive for abdominal distention, constipation and diarrhea.  All other systems reviewed and are negative.   Health Maintenance  Topic Date Due  . INFLUENZA VACCINE  01/26/2018  . FOOT EXAM  02/25/2018  . HEMOGLOBIN A1C  05/25/2018  .  OPHTHALMOLOGY EXAM  07/12/2018  . TETANUS/TDAP  12/03/2026  . PNA vac Low Risk Adult  Completed    Physical Exam: Vitals:   03/08/18 1126  BP: 118/74  Pulse: 65  Temp: 98 F (36.7 C)  TempSrc: Oral  SpO2: 98%  Weight: 194 lb 6.4 oz (88.2 kg)  Height: 6' (1.829 m)   Body mass  index is 26.37 kg/m. Physical Exam  Constitutional: He is oriented to person, place, and time. He appears well-developed and well-nourished.  HENT:  Mouth/Throat: Oropharynx is clear and moist.  MMM; no oral thrush  Eyes: Pupils are equal, round, and reactive to light. No scleral icterus.  Neck: Neck supple. Carotid bruit is not present. No thyromegaly present.  Cardiovascular: Normal rate, regular rhythm and intact distal pulses. Exam reveals no gallop and no friction rub.  Murmur (1/6 SEM) heard. no distal LE swelling. No calf TTP  Pulmonary/Chest: Effort normal and breath sounds normal. He has no wheezes. He has no rales. He exhibits no tenderness.  Abdominal: Soft. He exhibits distension. He exhibits no abdominal bruit, no pulsatile midline mass and no mass. Bowel sounds are increased. There is no hepatomegaly. There is no tenderness. There is no rebound and no guarding. A hernia is present. Hernia confirmed positive in the ventral area (reducible, large).  Genitourinary: Rectal exam shows no external hemorrhoid, no internal hemorrhoid, no mass, no tenderness and guaiac negative stool.  Genitourinary Comments: Prostate absent; stool dark appearing, loose  Lymphadenopathy:    He has no cervical adenopathy.  Neurological: He is alert and oriented to person, place, and time. He has normal reflexes.  Skin: Skin is warm and dry. No rash noted.  Psychiatric: He has a normal mood and affect. His behavior is normal. Judgment and thought content normal.    Labs reviewed: Basic Metabolic Panel: Recent Labs    04/06/17 1112 11/22/17 1003  NA 138 138  K 4.8 4.6  CL 107 106  CO2 27 26  GLUCOSE 105* 183*    BUN 38* 41*  CREATININE 1.84* 1.89*  CALCIUM 9.5 8.8  TSH  --  4.60*   Liver Function Tests: No results for input(s): AST, ALT, ALKPHOS, BILITOT, PROT, ALBUMIN in the last 8760 hours. No results for input(s): LIPASE, AMYLASE in the last 8760 hours. No results for input(s): AMMONIA in the last 8760 hours. CBC: Recent Labs    04/06/17 1112 11/22/17 1003  WBC 8.5 8.9  NEUTROABS 4.8 5.5  HGB 13.3 12.8*  HCT 39.8 37.5*  MCV 96.3 93.9  PLT 106.0* 116.0*   Lipid Panel: Recent Labs    11/22/17 1003  CHOL 119  HDL 29.10*  LDLCALC 57  TRIG 163.0*  CHOLHDL 4   Lab Results  Component Value Date   HGBA1C 6.0 (A) 11/22/2017    Procedures since last visit: Dg Abd 1 View  Result Date: 02/23/2018 CLINICAL DATA:  Constipation with hyperactive bowel sounds EXAM: ABDOMEN - 1 VIEW COMPARISON:  None. FINDINGS: Stool seen within the transverse and descending colon primarily. No colonic or small bowel distension. No fluid levels. Postoperative pelvis.  No concerning mass effect or calcification. IMPRESSION: Moderate stool without obstruction or rectal impaction. Electronically Signed   By: Monte Fantasia M.D.   On: 02/23/2018 10:13    Assessment/Plan   ICD-10-CM   1. Diverticulitis of large intestine without perforation or abscess without bleeding K57.32 BMP with eGFR([facility])    CBC with Differential/Platelets    amoxicillin-clavulanate (AUGMENTIN) 875-125 MG tablet    saccharomyces boulardii (FLORASTOR) 250 MG capsule  2. Constipation, unspecified constipation type K59.00 Ambulatory referral to Gastroenterology  3. Loose stools - possibly 2/2 lactose intolerance vs #1 R19.5 BMP with eGFR([facility])    CBC with Differential/Platelets    Ambulatory referral to Gastroenterology  4. Ventral hernia without obstruction or gangrene - asymptomatic K43.9   5. Diverticulosis large intestine w/o perforation  or abscess w/o bleeding K57.30 Ambulatory referral to Gastroenterology   START AUGMENTIN  '875MG'$  every 12 hours x 10 days for colon infection  TAKE PROBIOTIC 2 TIMES DAILY WHILE ON ANTIBIOTIC TO KEEP COLON HEALTHY  STOP MIRALAX for now - t/c linzess in future for constipation once current flare resolved  Cont other med as ordered  T/c CT abd/pelvis +/- surgery eval if hernia becomes more sympromatic  Will call with GI referral and lab results  Follow up with Dr Mariea Clonts as scheduled or sooner if need be.   [name] S. Perlie Gold  [facility] [address] [place], [place] [address] [phone] Cell (Monday-Friday 8 AM - 5 PM) [phone] After 5 PM and follow prompts

## 2018-03-09 ENCOUNTER — Telehealth: Payer: Self-pay | Admitting: Internal Medicine

## 2018-03-09 NOTE — Telephone Encounter (Signed)
OK as per clinic protocol. Thank you.

## 2018-03-09 NOTE — Telephone Encounter (Signed)
Dr. Henrene Pastor will you approve this switch?

## 2018-03-09 NOTE — Telephone Encounter (Signed)
sure

## 2018-03-09 NOTE — Telephone Encounter (Signed)
Dr. Rush Landmark will you accept this pt? Please advise.

## 2018-03-10 NOTE — Telephone Encounter (Signed)
See notes below

## 2018-03-14 ENCOUNTER — Telehealth: Payer: Self-pay | Admitting: *Deleted

## 2018-03-14 NOTE — Telephone Encounter (Signed)
Patient called and left a message on clinical intake stating that he wanted to let you know that the Amoxicillin and Florastor was very positive and he is doing much better.   Also patient wants to know what he should know about his abnormal BUN and Creatinine. Please Advise.

## 2018-03-14 NOTE — Telephone Encounter (Signed)
LMOM to return call.

## 2018-03-14 NOTE — Telephone Encounter (Signed)
Glad he is feeling better. His kidney function has been reduced over the last several mos and he has chronic kidney disease that is stable. Continue current medications and follow up with Dr Mariea Clonts as scheduled. If you have additional questions, you may need to discuss further with Dr Mariea Clonts.

## 2018-03-14 NOTE — Telephone Encounter (Signed)
Patient notified and agreed. Discussed labwork with patient.

## 2018-03-27 ENCOUNTER — Encounter

## 2018-03-29 ENCOUNTER — Ambulatory Visit (INDEPENDENT_AMBULATORY_CARE_PROVIDER_SITE_OTHER): Payer: Medicare Other

## 2018-03-29 DIAGNOSIS — Z23 Encounter for immunization: Secondary | ICD-10-CM

## 2018-03-30 ENCOUNTER — Encounter: Payer: Self-pay | Admitting: Gastroenterology

## 2018-03-30 ENCOUNTER — Ambulatory Visit (INDEPENDENT_AMBULATORY_CARE_PROVIDER_SITE_OTHER): Payer: Medicare Other | Admitting: Gastroenterology

## 2018-03-30 ENCOUNTER — Other Ambulatory Visit (INDEPENDENT_AMBULATORY_CARE_PROVIDER_SITE_OTHER): Payer: Medicare Other

## 2018-03-30 VITALS — BP 140/70 | HR 53 | Ht 72.0 in | Wt 195.0 lb

## 2018-03-30 DIAGNOSIS — R143 Flatulence: Secondary | ICD-10-CM | POA: Diagnosis not present

## 2018-03-30 DIAGNOSIS — R152 Fecal urgency: Secondary | ICD-10-CM

## 2018-03-30 DIAGNOSIS — K5900 Constipation, unspecified: Secondary | ICD-10-CM | POA: Diagnosis not present

## 2018-03-30 DIAGNOSIS — R194 Change in bowel habit: Secondary | ICD-10-CM | POA: Diagnosis not present

## 2018-03-30 LAB — BASIC METABOLIC PANEL
BUN: 36 mg/dL — ABNORMAL HIGH (ref 6–23)
CALCIUM: 9.4 mg/dL (ref 8.4–10.5)
CO2: 27 meq/L (ref 19–32)
CREATININE: 1.74 mg/dL — AB (ref 0.40–1.50)
Chloride: 106 mEq/L (ref 96–112)
GFR: 39.51 mL/min — AB (ref >60.00)
GLUCOSE: 89 mg/dL (ref 70–99)
Potassium: 4.9 mEq/L (ref 3.5–5.1)
SODIUM: 139 meq/L (ref 135–145)

## 2018-03-30 LAB — IGA: IgA: 143 mg/dL (ref 68–378)

## 2018-03-30 LAB — TSH: TSH: 3.58 u[IU]/mL (ref 0.35–4.50)

## 2018-03-30 NOTE — Patient Instructions (Signed)
Your provider has requested that you go to the basement level for lab work before leaving today. Press "B" on the elevator. The lab is located at the first door on the left as you exit the elevator.  We have scheduled you for a follow up appointment with Dr Rush Landmark on 05/05/18 at 230pm  Please continue daily Fibercon Thank you for entrusting me with your care and choosing [facility] care.  Dr Rush Landmark

## 2018-03-30 NOTE — Progress Notes (Signed)
[facility] VISIT   Primary Care Provider Gayland Curry, DO [place] [address] [phone]  Referring Provider Gayland Curry, DO [place], [place] [address] [phone]  Patient Profile: JODY SILAS is a 84 y.o. male with a pmh significant for Diverticulosis, Prior Colon Polyps, HTN, HLD, Cholelithiasis, MDD/Anxiety, DM, GERD, Prostate Cancer, OSA, Glaucoma, CRI, Self-Reported Lactose Intolerance.  The patient presents to the [facility] for an evaluation and management of problem(s) noted below:  Problem List 1. Change in bowel habits   2. Flatus   3. Constipation, unspecified constipation type   4. Fecal urgency     History of Present Illness: This is a previous patient of Dr. Henrene Pastor who returns for follow-up in GI clinic and has requested a transition of care.  He was last seen in June 2016.  At that time Dr. Henrene Pastor was seeing him for multiple GI issues including changes in bowel habits and increased gas/flatulence.  This was suspected to be gastritis versus mild bacterial overgrowth that reportedly resolved after course of Flagyl.  He had history of cholelithiasis and was currently asymptomatic but if there had been a return or recurrence of symptomatic gallstones then there would be plan for follow-up and potential cholecystectomy.  He had a history of GERD that was complicated by a peptic stricture with and he was asymptomatic on PPI.  His colonoscopy in 2009 showed diverticulosis but otherwise had a previous history of adenomatous polyps before that.  He was planned to be aged out of surveillance colonoscopies. In March, the patient describes having issues of constipation.  He was eventually given Florastor worse as well as MiraLAX.  After completion of this particular medication regimen he states he had improvement in his constipation.  However he still has urgency that occurs at times and this is his  biggest issue.  He has small stools but has significant explosion of flatus that occurs.  Prior to a year ago he was going every day or every other day.  He is previously tried Stage manager in the past.  His biggest thing is if he could have less flatus that would be helpful.  He describes no recent antibiotics for the last year.  His stools when they do come out small and clumped at times.  There is been no hematochezia or melena.  There is been no significant abdominal pain.  He does not take nonsteroidals on a regular basis.  Has had prior upper and lower endoscopies.  GI Review of Systems Positive as above Negative for dysphasia, odynophagia, abdominal pain, change in appetite, jaundice, weight loss  Review of Systems General: Denies fevers/chills/weight loss HEENT: Denies oral lesions Cardiovascular: Denies chest pain Pulmonary: Denies shortness of breath Gastroenterological: See HPI Genitourinary: Denies darkened urine Hematological: Denies easy bruising/bleeding Endocrine: Denies temperature intolerance Dermatological: Denies skin changes Psychological: Mood is stable Musculoskeletal: Denies new arthralgias   Medications Current Outpatient Medications  Medication Sig Dispense Refill  . Cholecalciferol (VITAMIN D) 2000 UNITS CAPS Take 1 capsule by mouth daily.      . ferrous sulfate 325 (65 FE) MG EC tablet Take 325 mg by mouth daily.    Marland Kitchen levothyroxine (SYNTHROID, LEVOTHROID) 25 MCG tablet Take 1 tablet (25 mcg total) by mouth daily before breakfast. 90 tablet 3  . lisinopril (PRINIVIL,ZESTRIL) 5 MG tablet TAKE ONE TABLET BY MOUTH ONE TIME DAILY  90 tablet 0  . rosuvastatin (CRESTOR) 40 MG tablet TAKE ONE TABLET BY  MOUTH ONE TIME DAILY  90 tablet 0  . sertraline (ZOLOFT) 25 MG tablet Take 1 tablet (25 mg total) by mouth daily. 30 tablet 5  . traZODone (DESYREL) 100 MG tablet take 1 tablet by mouth at bedtime 30 tablet 0   No current facility-administered medications for this visit.      Allergies Allergies  Allergen Reactions  . Lactose Intolerance (Gi)   . Sulfonamide Derivatives     REACTION: Itch/Red Spots    Histories Past Medical History:  Diagnosis Date  . BRADYCARDIA 11/13/2008  . Cholelithiasis   . COLONIC POLYPS, HX OF 02/04/2007  . DEPRESSION 02/04/2007  . DIABETES MELLITUS, TYPE II 02/04/2007  . DIVERTICULOSIS, COLON 02/04/2007  . FOREIGN BODY, ASPIRATION 12/04/2007  . GERD (gastroesophageal reflux disease)   . GLAUCOMA 03/08/2009  . Hepatitis A 1963  . HYPERLIPIDEMIA 12/04/2007  . HYPERTENSION 02/04/2007  . Memory loss   . OBSTRUCTIVE SLEEP APNEA 11/05/2009   -PSG 01/05/10 RDI 11, PLMI 56  . OSTEOARTHRITIS 02/04/2007  . Peptic stricture of esophagus   . PROSTATE CANCER 1997  . RENAL INSUFFICIENCY 11/13/2008  . SYNCOPE 12/31/2009  . THROMBOCYTOPENIA 04/17/2008  . THYROID NODULE, RIGHT 11/13/2008  . UNSPECIFIED ANEMIA 11/07/2007   Past Surgical History:  Procedure Laterality Date  . Heart Cartherization  2000  . PROSTATECTOMY  1997  . TONSILLECTOMY  1940's   Social History   Socioeconomic History  . Marital status: Married    Spouse name: Not on file  . Number of children: 3  . Years of education: college  . Highest education level: Bachelor's degree (e.g., BA, AB, BS)  Occupational History  . 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 Financial resource strain: Not on file  . Food insecurity:    Worry: Not on file    Inability: Not on file  . Transportation needs:    Medical: Not on file    Non-medical: Not on file  Tobacco Use  . Smoking status: Former Smoker    Types: Cigarettes, Pipe    Last attempt to quit: 07/08/1993    Years since quitting: 24.7  . Smokeless tobacco: Never Used  Substance and Sexual Activity  . Alcohol use: Yes    Comment: minimal  . Drug use: No  . Sexual activity: Not on file  Lifestyle  . Physical activity:    Days per week: Not on file    Minutes per session:  Not on file  . Stress: Not on file  Relationships  . Social connections:    Talks on phone: Not on file    Gets together: Not on file    Attends religious service: Not on file    Active member of club or organization: Not on file    Attends meetings of clubs or organizations: Not on file    Relationship status: Not on file  . Intimate partner violence:    Fear of current or ex partner: Not on file    Emotionally abused: Not on file    Physically abused: Not on file    Forced sexual activity: Not on file  Other Topics Concern  . Not on file  Social History Narrative   He played tennis 3x a week before moving to [place].   Right-handed.   2 cups caffeine per day.      Family History  Problem Relation Age of Onset  . Kidney disease Father  Bright's Disease - died at age 71  . Other Mother        no signifincant health problems - died at age 7  . Heart disease Neg Hx        No FH of Coronary Artery Disease  . Cancer Neg Hx   . Colon cancer Neg Hx   . Esophageal cancer Neg Hx   . Inflammatory bowel disease Neg Hx   . Liver disease Neg Hx   . Pancreatic cancer Neg Hx   . Rectal cancer Neg Hx   . Stomach cancer Neg Hx    I have reviewed his medical, social, and family history in detail and updated the electronic medical record as necessary.    PHYSICAL EXAMINATION  BP 140/70   Pulse (!) 53   Ht 6' (1.829 m)   Wt 195 lb (88.5 kg)   BMI 26.45 kg/m  Wt Readings from Last 3 Encounters:  03/30/18 195 lb (88.5 kg)  03/08/18 194 lb 6.4 oz (88.2 kg)  03/07/18 195 lb 8 oz (88.7 kg)  GEN: NAD, appears stated age, doesn't appear chronically ill PSYCH: Cooperative, without pressured speech EYE: Conjunctivae pink, sclerae anicteric ENT: MMM, without oral ulcers, no erythema or exudates noted NECK: Supple CV: RR without R/Gs  RESP: CTAB posteriorly, without wheezing GI: NABS, soft, NT/ND, without rebound or guarding, no HSM appreciated MSK/EXT: No lower extremity  edema SKIN: No jaundice NEURO:  Alert & Oriented x 3, no focal deficits   REVIEW OF DATA  I reviewed the following data at the time of this encounter:  GI Procedures and Studies  2015 upper endoscopy GERD with peptic stricture and ring in the distal esophagus, mild esophagitis, hiatal hernia, normal stomach, normal duodenum.  Concern for gallstone disease as etiology of pain per impression with plan for general surgery evaluation.  2009 colonoscopy Left-sided diverticulosis otherwise normal colon  2005 colonoscopy Left-sided diverticulosis.  Rectal polyp 8 mm in size removed with snare cautery polypectomy.  Plan for 4-year colonoscopy follow-up.  2001 colonoscopy Splenic flexure polyp 3 mm in size removed via hot snare polypectomy and not sent for pathology Left-sided diverticulosis. Plan for 4-year colonoscopy follow-up  Laboratory Studies  Reviewed in epic  Imaging Studies  2015 abdominal ultrasound IMPRESSION: 1. Cholelithiasis and borderline gallbladder wall thickening without positive sonographic Murphy's sign. The findings may reflect low-grade acute cholecystitis. 2. No acute intra-abdominal abnormality is demonstrated otherwise. The pancreas and abdominal aorta were obscured by bowel gas.   ASSESSMENT  Mr. Soley is a 84 y.o. male with a pmh significant for Diverticulosis, Prior Colon Polyps, HTN, HLD, Cholelithiasis, MDD/Anxiety, DM, GERD, Prostate Cancer, OSA, Glaucoma, CRI, Self-Reported Lactose Intolerance.  The patient is seen today for evaluation and management of:  1. Change in bowel habits   2. Flatus   3. Constipation, unspecified constipation type   4. Fecal urgency    This is the patient's first visit in the last 3 years to the [facility].He is previously been seen by Dr. Henrene Pastor back in 2015 and 2016.  He had previously had issues including changes in his bowel habits with increased gas development that resolved after a course of Flagyl  previously without being evaluated or having a breath test positive resume.  Also history of asymptomatic cholelithiasis which was monitored and a history of peptic stricturing disease with heartburn/pyrosis.  His 2009 colonoscopy showed only diverticulosis no further tubular adenomas were found.  At this point in time the patient is describing  having issues starting in March of increasing constipation.  He was seen by his primary care doctor and then was eventually seen by his new primary care physician and was treated with Florastor.  He had been taking MiraLAX on a daily basis as well as Benefiber and that was stopped.  He has not restarted Benefiber or MiraLAX after the Florastor has been completed.  He did feel that this was helpful for the patient.  Interestingly his urgency has recurred in the setting of having significant flatus expulsion.  We will begin a basic laboratory work-up for etiologies of increased bloating and flatus as noted below.  We will restart fiber but try FiberCon which is less bloatogenic.  He will begin use of MiraLAX on an as-needed basis to try and have a bowel movement every day or every other day if necessary.  If work-up is unrevealing then will consider a small intestine bacterial overgrowth evaluation via breath testing to rule this out, it looks like previously in the notes although I cannot see the exact date or time he has been treated with Flagyl for potential bacterial overgrowth).  All patient questions were answered, to the best of my ability, and the patient agrees to the aforementioned plan of action with follow-up as indicated.   PLAN  1. Change in bowel habits - Basic metabolic panel; Future - TSH; Future - Tissue transglutaminase, IgA; Future - IgA; Future  2. Flatus - Consider SIBO testing if Constipation is taken care of if not relieved  3. Constipation, unspecified constipation type - Fibercon to be initiated - Miralax to be reinitiated if having  recurrent constipation  4. Fecal urgency - Fiber supplementation to try and bulk stools   Orders Placed This Encounter  Procedures  . Basic metabolic panel  . TSH  . Tissue transglutaminase, IgA  . IgA    New Prescriptions   No medications on file   Modified Medications   No medications on file    Planned Follow Up: No follow-ups on file.   Justice Britain, MD [facility] Advanced [facility]oscopy Office # [phone]

## 2018-03-31 LAB — TISSUE TRANSGLUTAMINASE, IGA: (tTG) Ab, IgA: 1 U/mL

## 2018-04-02 ENCOUNTER — Encounter: Payer: Self-pay | Admitting: Gastroenterology

## 2018-04-02 DIAGNOSIS — R152 Fecal urgency: Secondary | ICD-10-CM | POA: Insufficient documentation

## 2018-04-02 DIAGNOSIS — R143 Flatulence: Secondary | ICD-10-CM | POA: Insufficient documentation

## 2018-04-02 DIAGNOSIS — R194 Change in bowel habit: Secondary | ICD-10-CM | POA: Insufficient documentation

## 2018-04-17 ENCOUNTER — Ambulatory Visit
Admission: RE | Admit: 2018-04-17 | Discharge: 2018-04-17 | Disposition: A | Discharge status: Home / Self Care | Payer: Medicare Other | Source: Ambulatory Visit | Attending: Neurology | Admitting: Neurology

## 2018-04-17 ENCOUNTER — Telehealth: Payer: Self-pay | Admitting: Neurology

## 2018-04-17 DIAGNOSIS — R413 Other amnesia: Secondary | ICD-10-CM

## 2018-04-17 NOTE — Telephone Encounter (Signed)
Please call patient, MRI of brain showed generalized atrophy, small vessel disease, there is no acute abnormalities noted   IMPRESSION: This MRI of the brain without contrast shows the following: 1.    Mild generalized cortical atrophy, typical for age. 2.    Mild chronic microvascular ischemic changes. 3     There are no acute findings.

## 2018-04-17 NOTE — Telephone Encounter (Signed)
Spoke to patient and his wife.  They are aware of the results and verbalized understanding.

## 2018-04-19 ENCOUNTER — Other Ambulatory Visit: Payer: Self-pay | Admitting: *Deleted

## 2018-04-19 MED ORDER — ROSUVASTATIN CALCIUM 40 MG PO TABS: ORAL_TABLET | ORAL | 1 refills | Status: DC

## 2018-04-19 NOTE — Telephone Encounter (Signed)
Patient requested Rx to be faxed to [facility].

## 2018-04-24 ENCOUNTER — Encounter: Payer: Self-pay | Admitting: Cardiovascular Disease

## 2018-04-24 ENCOUNTER — Ambulatory Visit (INDEPENDENT_AMBULATORY_CARE_PROVIDER_SITE_OTHER): Payer: Medicare Other | Admitting: Cardiovascular Disease

## 2018-04-24 VITALS — BP 130/70 | HR 52 | Ht 72.0 in | Wt 195.0 lb

## 2018-04-24 DIAGNOSIS — R609 Edema, unspecified: Secondary | ICD-10-CM

## 2018-04-24 DIAGNOSIS — R0602 Shortness of breath: Secondary | ICD-10-CM

## 2018-04-24 DIAGNOSIS — L821 Other seborrheic keratosis: Secondary | ICD-10-CM | POA: Diagnosis not present

## 2018-04-24 DIAGNOSIS — L570 Actinic keratosis: Secondary | ICD-10-CM | POA: Diagnosis not present

## 2018-04-24 DIAGNOSIS — C44319 Basal cell carcinoma of skin of other parts of face: Secondary | ICD-10-CM | POA: Diagnosis not present

## 2018-04-24 DIAGNOSIS — I10 Essential (primary) hypertension: Secondary | ICD-10-CM

## 2018-04-24 DIAGNOSIS — D692 Other nonthrombocytopenic purpura: Secondary | ICD-10-CM | POA: Diagnosis not present

## 2018-04-24 DIAGNOSIS — L720 Epidermal cyst: Secondary | ICD-10-CM | POA: Diagnosis not present

## 2018-04-24 DIAGNOSIS — Z85828 Personal history of other malignant neoplasm of skin: Secondary | ICD-10-CM | POA: Diagnosis not present

## 2018-04-24 DIAGNOSIS — D485 Neoplasm of uncertain behavior of skin: Secondary | ICD-10-CM | POA: Diagnosis not present

## 2018-04-24 DIAGNOSIS — L300 Nummular dermatitis: Secondary | ICD-10-CM | POA: Diagnosis not present

## 2018-04-24 NOTE — Progress Notes (Signed)
Cardiology Office Note:    Date:  04/25/2018   ID:  [name], DOB 11-Sep-1927, MRN [id number]  PCP:  Gayland Curry, DO  Cardiologist:  Sherren Mocha, MD  Electrophysiologist:  None   Referring MD: Renato Shin, MD   Chief Complaint  Patient presents with  . Hypertension    History of Present Illness:    [name] is a 84 y.o. male with a hx of hypertension and remote syncope, presenting for follow-up evaluation.  The patient is here alone today.  He and his wife have moved into [facility].  They are adjusting to that but doing well overall.  The patient complains of leg swelling over the past year.  He mostly appreciates this in his lower legs with a sock indentation.  I do not otherwise particularly bother him.  He denies a feeling of leg heaviness.  He also has mild shortness of breath with exertion.  He notices this when he exercises at the gym at [place].  He denies orthopnea, PND, chest pain, or chest pressure.  He has no heart palpitations.  Past Medical History:  Diagnosis Date  . BRADYCARDIA 11/13/2008  . Cholelithiasis   . COLONIC POLYPS, HX OF 02/04/2007  . DEPRESSION 02/04/2007  . DIABETES MELLITUS, TYPE II 02/04/2007  . DIVERTICULOSIS, COLON 02/04/2007  . FOREIGN BODY, ASPIRATION 12/04/2007  . GERD (gastroesophageal reflux disease)   . GLAUCOMA 03/08/2009  . Hepatitis A 1963  . HYPERLIPIDEMIA 12/04/2007  . HYPERTENSION 02/04/2007  . Memory loss   . OBSTRUCTIVE SLEEP APNEA 11/05/2009   -PSG 01/05/10 RDI 11, PLMI 56  . OSTEOARTHRITIS 02/04/2007  . Peptic stricture of esophagus   . PROSTATE CANCER 1997  . RENAL INSUFFICIENCY 11/13/2008  . SYNCOPE 12/31/2009  . THROMBOCYTOPENIA 04/17/2008  . THYROID NODULE, RIGHT 11/13/2008  . UNSPECIFIED ANEMIA 11/07/2007    Past Surgical History:  Procedure Laterality Date  . Heart Cartherization  2000  . PROSTATECTOMY  1997  . TONSILLECTOMY  1940's    Current Medications: Current Meds  Medication Sig  .  Cholecalciferol (VITAMIN D) 2000 UNITS CAPS Take 1 capsule by mouth daily.    . ferrous sulfate 325 (65 FE) MG EC tablet Take 325 mg by mouth daily.  Marland Kitchen levothyroxine (SYNTHROID, LEVOTHROID) 25 MCG tablet Take 1 tablet (25 mcg total) by mouth daily before breakfast.  . lisinopril (PRINIVIL,ZESTRIL) 5 MG tablet TAKE ONE TABLET BY MOUTH ONE TIME DAILY   . rosuvastatin (CRESTOR) 40 MG tablet Take one tablet by mouth once daily  . sertraline (ZOLOFT) 25 MG tablet Take 1 tablet (25 mg total) by mouth daily.  . traZODone (DESYREL) 100 MG tablet take 1 tablet by mouth at bedtime     Allergies:   Lactose intolerance (gi) and Sulfonamide derivatives   Social History   Socioeconomic History  . Marital status: Married    Spouse name: Not on file  . Number of children: 3  . Years of education: college  . Highest education level: Bachelor's degree (e.g., BA, AB, BS)  Occupational History  . 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 Financial resource strain: Not on file  . Food insecurity:    Worry: Not on file    Inability: Not on file  . Transportation needs:    Medical: Not on file    Non-medical: Not on file  Tobacco Use  . Smoking status: Former Smoker    Types: Cigarettes, Pipe  Last attempt to quit: 07/08/1993    Years since quitting: 24.8  . Smokeless tobacco: Never Used  Substance and Sexual Activity  . Alcohol use: Yes    Comment: minimal  . Drug use: No  . Sexual activity: Not on file  Lifestyle  . Physical activity:    Days per week: Not on file    Minutes per session: Not on file  . Stress: Not on file  Relationships  . Social connections:    Talks on phone: Not on file    Gets together: Not on file    Attends religious service: Not on file    Active member of club or organization: Not on file    Attends meetings of clubs or organizations: Not on file    Relationship status: Not on file  Other Topics Concern  . Not  on file  Social History Narrative   He played tennis 3x a week before moving to [place].   Right-handed.   2 cups caffeine per day.        Family History: The patient's family history includes Kidney disease in his father; Other in his mother. There is no history of Heart disease, Cancer, Colon cancer, Esophageal cancer, Inflammatory bowel disease, Liver disease, Pancreatic cancer, Rectal cancer, or Stomach cancer.  ROS:   Please see the history of present illness.    Leg swelling. All other systems reviewed and are negative.  EKGs/Labs/Other Studies Reviewed:    EKG:  EKG is ordered today.  The ekg ordered today demonstrates sinus bradycardia 52 bpm, first-degree AV block, otherwise within normal limits.  Recent Labs: 03/08/2018: Hemoglobin 13.1; Platelets 120 03/30/2018: BUN 36; Creatinine, Ser 1.74; Potassium 4.9; Sodium 139; TSH 3.58  Recent Lipid Panel    Component Value Date/Time   CHOL 119 11/22/2017 1003   TRIG 163.0 (H) 11/22/2017 1003   TRIG 48 04/05/2006 0750   HDL 29.10 (L) 11/22/2017 1003   CHOLHDL 4 11/22/2017 1003   VLDL 32.6 11/22/2017 1003   LDLCALC 57 11/22/2017 1003    Physical Exam:    VS:  BP 130/70   Pulse (!) 52   Ht 6' (1.829 m)   Wt 195 lb (88.5 kg)   SpO2 98%   BMI 26.45 kg/m     Wt Readings from Last 3 Encounters:  04/24/18 195 lb (88.5 kg)  03/30/18 195 lb (88.5 kg)  03/08/18 194 lb 6.4 oz (88.2 kg)     GEN:  Well nourished, well developed elderly male in no acute distress HEENT: Normal NECK: No JVD; No carotid bruits LYMPHATICS: No lymphadenopathy CARDIAC: RRR, no murmurs, rubs, gallops RESPIRATORY:  Clear to auscultation without rales, wheezing or rhonchi  ABDOMEN: Soft, non-tender, non-distended MUSCULOSKELETAL:  1+ bilateral pretibial edema; No deformity  SKIN: Warm and dry NEUROLOGIC:  Alert and oriented x 3 PSYCHIATRIC:  Normal affect   ASSESSMENT:    1. Essential hypertension   2. SOB (shortness of breath)   3.  Edema, unspecified type    PLAN:    In order of problems listed above:  1. Blood pressure is well controlled.  The patient is appropriately on an ACE inhibitor in the setting of stage III chronic kidney disease and he is tolerating this well.  His creatinine has been stable and labs over the past 3 years are reviewed with most recent creatinine 1.74 mg/dL. 2. I am going to check an echocardiogram to evaluate LV systolic and diastolic dysfunction.  His cardiac exam is essentially normal  with the exception of lower extremity edema as outlined above.   Medication Adjustments/Labs and Tests Ordered: Current medicines are reviewed at length with the patient today.  Concerns regarding medicines are outlined above.  Orders Placed This Encounter  Procedures  . EKG 12-Lead  . ECHOCARDIOGRAM COMPLETE   No orders of the defined types were placed in this encounter.   Patient Instructions  Medication Instructions:  Your provider recommends that you continue on your current medications as directed. Please refer to the Current Medication list given to you today.    Labwork: None  Testing/Procedures: Your provider has requested that you have an echocardiogram. Echocardiography is a painless test that uses sound waves to create images of your heart. It provides your doctor with information about the size and shape of your heart and how well your heart's chambers and valves are working. This procedure takes approximately one hour. There are no restrictions for this procedure.  Follow-Up: Your provider wants you to follow-up in: 1 year with Dr. Burt Knack or his assistant. You will receive a reminder letter in the mail two months in advance. If you don't receive a letter, please call our office to schedule the follow-up appointment.    Any Other Special Instructions Will Be Listed Below (If Applicable).     If you need a refill on your cardiac medications before your next appointment, please call your  pharmacy.     Signed, Sherren Mocha, MD  04/25/2018 1:06 PM    [place]

## 2018-04-24 NOTE — Patient Instructions (Signed)
Medication Instructions:  Your provider recommends that you continue on your current medications as directed. Please refer to the Current Medication list given to you today.    Labwork: None  Testing/Procedures: Your provider has requested that you have an echocardiogram. Echocardiography is a painless test that uses sound waves to create images of your heart. It provides your doctor with information about the size and shape of your heart and how well your heart's chambers and valves are working. This procedure takes approximately one hour. There are no restrictions for this procedure.  Follow-Up: Your provider wants you to follow-up in: 1 year with Dr. Burt Knack or his assistant. You will receive a reminder letter in the mail two months in advance. If you don't receive a letter, please call our office to schedule the follow-up appointment.    Any Other Special Instructions Will Be Listed Below (If Applicable).     If you need a refill on your cardiac medications before your next appointment, please call your pharmacy.

## 2018-05-01 ENCOUNTER — Other Ambulatory Visit: Payer: Self-pay

## 2018-05-01 ENCOUNTER — Ambulatory Visit (HOSPITAL_COMMUNITY): Payer: Medicare Other | Attending: Cardiovascular Disease

## 2018-05-01 DIAGNOSIS — R609 Edema, unspecified: Secondary | ICD-10-CM | POA: Diagnosis not present

## 2018-05-01 DIAGNOSIS — R0602 Shortness of breath: Secondary | ICD-10-CM | POA: Diagnosis not present

## 2018-05-05 ENCOUNTER — Ambulatory Visit (INDEPENDENT_AMBULATORY_CARE_PROVIDER_SITE_OTHER): Payer: Medicare Other | Admitting: Gastroenterology

## 2018-05-05 ENCOUNTER — Encounter: Payer: Self-pay | Admitting: Gastroenterology

## 2018-05-05 VITALS — BP 110/60 | HR 66 | Ht 71.0 in | Wt 199.0 lb

## 2018-05-05 DIAGNOSIS — R152 Fecal urgency: Secondary | ICD-10-CM | POA: Diagnosis not present

## 2018-05-05 DIAGNOSIS — R143 Flatulence: Secondary | ICD-10-CM | POA: Diagnosis not present

## 2018-05-05 DIAGNOSIS — R194 Change in bowel habit: Secondary | ICD-10-CM

## 2018-05-05 NOTE — Progress Notes (Signed)
[facility] VISIT   Primary Care Provider Gayland Curry, DO [place] [address] [phone]  Referring Provider Gayland Curry, DO [place], [place] [address] [phone]  Patient Profile: [name] is a 84 y.o. male with a pmh significant for Diverticulosis, Prior Colon Polyps, HTN, HLD, Cholelithiasis, MDD/Anxiety, DM, GERD, Prostate Cancer, OSA, Glaucoma, CRI, Self-Reported Lactose Intolerance.  The patient presents to the [facility] for an evaluation and management of problem(s) noted below:  Problem List 1. Change in bowel habit   2. Flatus   3. Fecal urgency     History of Present Illness: Please see prior notes for full details of history of present illness.    Interval History: At the time of his last visit we decided to add fiber to his regimen to try and help bulk his stools and see if that would make any difference in regards to his gas as well as "explosive diarrhea."  Patient feels that things have improved somewhat while being on this.  I had actually seen the patient when he came for his wife's endoscopy and asked him to continue to increase his fiber supplementation to twice daily dosing.  He is currently doing that.  He is noting no evidence of any blood in his stools.  However the patient is feeling at times periods of almost incomplete evacuation as if he has to strain to help things pass.  Again prior to the last year he had never had to have issues with straining occur.  His last colonoscopy was in 2009 and thus has been 10 years.  GI Review of Systems Positive as above Negative for dysphagia, odynophagia, weight loss, jaundice, melena, hematochezia  Review of Systems General: Denies fevers/chills HEENT: Denies oral lesions Cardiovascular: Denies chest pain Pulmonary: Denies shortness of breath Gastroenterological: See HPI Hematological: Denies easy  bruising/bleeding Dermatological: Denies jaundice Psychological: Mood is stable   Medications Current Outpatient Medications  Medication Sig Dispense Refill  . Cholecalciferol (VITAMIN D) 2000 UNITS CAPS Take 1 capsule by mouth daily.      . ferrous sulfate 325 (65 FE) MG EC tablet Take 325 mg by mouth daily.    Marland Kitchen levothyroxine (SYNTHROID, LEVOTHROID) 25 MCG tablet Take 1 tablet (25 mcg total) by mouth daily before breakfast. 90 tablet 3  . lisinopril (PRINIVIL,ZESTRIL) 5 MG tablet TAKE ONE TABLET BY MOUTH ONE TIME DAILY  90 tablet 0  . rosuvastatin (CRESTOR) 40 MG tablet Take one tablet by mouth once daily 90 tablet 1  . sertraline (ZOLOFT) 25 MG tablet Take 1 tablet (25 mg total) by mouth daily. 30 tablet 5  . traZODone (DESYREL) 100 MG tablet take 1 tablet by mouth at bedtime 30 tablet 0   No current facility-administered medications for this visit.     Allergies Allergies  Allergen Reactions  . Lactose Intolerance (Gi)   . Sulfonamide Derivatives     REACTION: Itch/Red Spots    Histories Past Medical History:  Diagnosis Date  . BRADYCARDIA 11/13/2008  . Cholelithiasis   . COLONIC POLYPS, HX OF 02/04/2007  . DEPRESSION 02/04/2007  . DIABETES MELLITUS, TYPE II 02/04/2007  . DIVERTICULOSIS, COLON 02/04/2007  . FOREIGN BODY, ASPIRATION 12/04/2007  . GERD (gastroesophageal reflux disease)   . GLAUCOMA 03/08/2009  . Hepatitis A 1963  . HYPERLIPIDEMIA 12/04/2007  . HYPERTENSION 02/04/2007  . Memory loss   . OBSTRUCTIVE SLEEP APNEA 11/05/2009   -PSG 01/05/10 RDI 11, PLMI 56  .  OSTEOARTHRITIS 02/04/2007  . Peptic stricture of esophagus   . PROSTATE CANCER 1997  . RENAL INSUFFICIENCY 11/13/2008  . SYNCOPE 12/31/2009  . THROMBOCYTOPENIA 04/17/2008  . THYROID NODULE, RIGHT 11/13/2008  . UNSPECIFIED ANEMIA 11/07/2007   Past Surgical History:  Procedure Laterality Date  . Heart Cartherization  2000  . PROSTATECTOMY  1997  . TONSILLECTOMY  1940's   Social History   Socioeconomic History   . Marital status: Married    Spouse name: Not on file  . Number of children: 3  . Years of education: college  . Highest education level: Bachelor's degree (e.g., BA, AB, BS)  Occupational History  . 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 Financial resource strain: Not on file  . Food insecurity:    Worry: Not on file    Inability: Not on file  . Transportation needs:    Medical: Not on file    Non-medical: Not on file  Tobacco Use  . Smoking status: Former Smoker    Types: Cigarettes, Pipe    Last attempt to quit: 07/08/1993    Years since quitting: 24.8  . Smokeless tobacco: Never Used  Substance and Sexual Activity  . Alcohol use: Yes    Comment: minimal  . Drug use: No  . Sexual activity: Not on file  Lifestyle  . Physical activity:    Days per week: Not on file    Minutes per session: Not on file  . Stress: Not on file  Relationships  . Social connections:    Talks on phone: Not on file    Gets together: Not on file    Attends religious service: Not on file    Active member of club or organization: Not on file    Attends meetings of clubs or organizations: Not on file    Relationship status: Not on file  . Intimate partner violence:    Fear of current or ex partner: Not on file    Emotionally abused: Not on file    Physically abused: Not on file    Forced sexual activity: Not on file  Other Topics Concern  . Not on file  Social History Narrative   He played tennis 3x a week before moving to [place].   Right-handed.   2 cups caffeine per day.      Family History  Problem Relation Age of Onset  . Kidney disease Father        Bright's Disease - died at age 33  . Other Mother        no signifincant health problems - died at age 22  . Heart disease Neg Hx        No FH of Coronary Artery Disease  . Cancer Neg Hx   . Colon cancer Neg Hx   . Esophageal cancer Neg Hx   . Inflammatory bowel disease Neg  Hx   . Liver disease Neg Hx   . Pancreatic cancer Neg Hx   . Rectal cancer Neg Hx   . Stomach cancer Neg Hx    I have reviewed his medical, social, and family history in detail and updated the electronic medical record as necessary.    PHYSICAL EXAMINATION  BP 110/60   Pulse 66   Ht 5\' 11"  (1.803 m)   Wt 199 lb (90.3 kg)   BMI 27.75 kg/m  Wt Readings from Last 3 Encounters:  05/05/18 199 lb (90.3 kg)  04/24/18 195 lb (  88.5 kg)  03/30/18 195 lb (88.5 kg)  GEN: NAD, appears stated age, doesn't appear chronically ill PSYCH: Cooperative, without pressured speech EYE: Conjunctivae pink, sclerae anicteric ENT: MMM CV: RR without R/Gs  RESP: CTAB posteriorly, without wheezing GI: NABS, soft, NT/ND, without rebound or guarding, no HSM appreciated MSK/EXT: No lower extremity edema SKIN: No jaundice NEURO:  Alert & Oriented x 3, no focal deficits   REVIEW OF DATA  I reviewed the following data at the time of this encounter:  GI Procedures and Studies  No new studies to review since prior notation  Laboratory Studies  Reviewed in epic  Imaging Studies  No new studies to review   ASSESSMENT  Mr. Rami is a 84 y.o. male with a pmh significant for Diverticulosis, Prior Colon Polyps, HTN, HLD, Cholelithiasis, MDD/Anxiety, DM, GERD, Prostate Cancer, OSA, Glaucoma, CRI, Self-Reported Lactose Intolerance.  The patient is seen today for evaluation and management of:  1. Change in bowel habit   2. Flatus   3. Fecal urgency    This is a hemodynamically stable patient who returns for scheduled follow-up in the setting of having increased flatus as well as explosive diarrhea.  Patient has remained stable and may have had some improvement with the fiber supplementation however continues to have a concern and some issues of fecal urgency and potential incomplete evacuation.  I think that his symptoms are more likely a result of a functional GI disorder with a spectrum of IBS however  with that being said it has been over 10 years since his last endoscopic evaluation although I do not think we will end up finding in the advanced lesion or obstructive process is worthwhile for [place] to proceed with a procedure to evaluate the lower GI tract and also plan on taking biopsies to rule out microscopic or collagenous colitis.  We also discussed the role of potentially treating or evaluating for small intestine bacterial overgrowth as it seems in the past he had been treated.  Before we treat him with antibiotics I would like to confirm bacterial overgrowth.  If his colonoscopy is unremarkable we will maintain and ensure that he is on a good bowel regimen to let things pass I would also then go ahead and proceed with breath testing.  If this is negative then I would just continue to treat the patient symptomatically as necessary.  All patient questions were answered, to the best of my ability, and the patient agrees to the aforementioned plan of action with follow-up as indicated.   PLAN  1. Change in bowel habit - Continue Fibercon BID - Colonoscopy Diagnostic for rule out obstruction and proceed with colonic biopsies to rule out microscopic/collagenous colitis  2. Flatus - Consider use of  - Will consider SIBO breath testing if negative evaluation - Consider use of Simethicone or Gas-X if continues to be an issue  3. Fecal urgency - Will get rectal biopsies to rule out proctitis   No orders of the defined types were placed in this encounter.   New Prescriptions   No medications on file   Modified Medications   No medications on file    Planned Follow Up: No follow-ups on file.   Justice Britain, MD [facility] Office # [phone]

## 2018-05-05 NOTE — Patient Instructions (Signed)
Normal BMI (Body Mass Index- based on height and weight) is between 23 and 30. Your BMI today is Body mass index is 27.75 kg/m. Marland Kitchen Please consider follow up  regarding your BMI with your Primary Care Provider.  We will contact you next week to get you scheduled for a colonoscopy  Thank you for entrusting me with your care and choosing [facility].  Dr Rush Landmark

## 2018-05-07 ENCOUNTER — Encounter: Payer: Self-pay | Admitting: Gastroenterology

## 2018-05-11 ENCOUNTER — Ambulatory Visit: Payer: Medicare Other

## 2018-05-11 VITALS — Ht 71.0 in | Wt 199.8 lb

## 2018-05-11 DIAGNOSIS — R194 Change in bowel habit: Secondary | ICD-10-CM

## 2018-05-11 MED ORDER — PEG 3350-KCL-NA BICARB-NACL 420 G PO SOLR: 4000.0000 mL | Freq: Once | ORAL | 0 refills | Status: AC

## 2018-05-11 NOTE — Progress Notes (Unsigned)
Denies allergies to eggs or soy products. Denies complication of anesthesia or sedation. Denies use of weight loss medication. Denies use of O2.   Emmi instructions declined.    Extra time was spent reviewing instructions with the patient and his wife. They were both overwhelmed with the instructions and thought that they were too complicated. I tried to break up the instructions by telling them to follow the 3 steps that I circled for them one step at a time. I told the patients to call if they had questions and we would be happy to help them.

## 2018-05-16 ENCOUNTER — Encounter: Payer: Self-pay | Admitting: Internal Medicine

## 2018-05-16 DIAGNOSIS — Z79899 Other long term (current) drug therapy: Secondary | ICD-10-CM | POA: Diagnosis not present

## 2018-05-16 DIAGNOSIS — D649 Anemia, unspecified: Secondary | ICD-10-CM | POA: Diagnosis not present

## 2018-05-16 DIAGNOSIS — D519 Vitamin B12 deficiency anemia, unspecified: Secondary | ICD-10-CM | POA: Diagnosis not present

## 2018-05-16 DIAGNOSIS — E119 Type 2 diabetes mellitus without complications: Secondary | ICD-10-CM | POA: Diagnosis not present

## 2018-05-16 LAB — BASIC METABOLIC PANEL
BUN: 39 — AB (ref 4–21)
Creatinine: 1.8 — AB (ref 0.6–1.3)
Glucose: 98
Potassium: 4.7 (ref 3.4–5.3)
Sodium: 143 (ref 137–147)

## 2018-05-16 LAB — CBC AND DIFFERENTIAL
HCT: 41 (ref 41–53)
Hemoglobin: 13.9 (ref 13.5–17.5)
Platelets: 122 — AB (ref 150–399)
WBC: 8.5

## 2018-05-16 LAB — HEPATIC FUNCTION PANEL
ALT: 23 (ref 10–40)
AST: 27 (ref 14–40)
Alkaline Phosphatase: 66 (ref 25–125)
Bilirubin, Total: 0.6

## 2018-05-16 LAB — HEMOGLOBIN A1C: Hemoglobin A1C: 5.7 % — AB (ref 4.0–5.6)

## 2018-05-16 LAB — VITAMIN B12: Vitamin B-12: 640

## 2018-05-19 ENCOUNTER — Encounter: Payer: Self-pay | Admitting: Internal Medicine

## 2018-05-19 DIAGNOSIS — Z85828 Personal history of other malignant neoplasm of skin: Secondary | ICD-10-CM | POA: Diagnosis not present

## 2018-05-19 DIAGNOSIS — C44329 Squamous cell carcinoma of skin of other parts of face: Secondary | ICD-10-CM | POA: Diagnosis not present

## 2018-05-24 ENCOUNTER — Non-Acute Institutional Stay: Payer: Medicare Other | Admitting: Internal Medicine

## 2018-05-24 ENCOUNTER — Encounter: Payer: Self-pay | Admitting: Internal Medicine

## 2018-05-24 VITALS — BP 122/60 | HR 57 | Temp 98.5°F | Ht 71.0 in | Wt 200.0 lb

## 2018-05-24 DIAGNOSIS — E039 Hypothyroidism, unspecified: Secondary | ICD-10-CM

## 2018-05-24 DIAGNOSIS — E113293 Type 2 diabetes mellitus with mild nonproliferative diabetic retinopathy without macular edema, bilateral: Secondary | ICD-10-CM

## 2018-05-24 DIAGNOSIS — F320 Major depressive disorder, single episode, mild: Secondary | ICD-10-CM

## 2018-05-24 DIAGNOSIS — K5730 Diverticulosis of large intestine without perforation or abscess without bleeding: Secondary | ICD-10-CM

## 2018-05-24 DIAGNOSIS — Z4802 Encounter for removal of sutures: Secondary | ICD-10-CM | POA: Diagnosis not present

## 2018-05-24 DIAGNOSIS — Z9189 Other specified personal risk factors, not elsewhere classified: Secondary | ICD-10-CM | POA: Diagnosis not present

## 2018-05-24 DIAGNOSIS — R194 Change in bowel habit: Secondary | ICD-10-CM

## 2018-05-24 DIAGNOSIS — Z794 Long term (current) use of insulin: Secondary | ICD-10-CM | POA: Diagnosis not present

## 2018-05-24 MED ORDER — ESCITALOPRAM OXALATE 10 MG PO TABS: 10.0000 mg | ORAL_TABLET | Freq: Every day | ORAL | 3 refills | Status: DC

## 2018-05-24 NOTE — Progress Notes (Signed)
Location:  [facility] Provider:  Taresa Montville L. Mariea Clonts, D.O., C.M.D.  Code Status:  Full code Goals of Care:  Advanced Directives 05/24/2018  Does Patient Have a Medical Advance Directive? Yes  Type of Advance Directive Living will;Healthcare Power of Attorney  Does patient want to make changes to medical advance directive? No - Patient declined  Copy of [place] in Chart? No - copy requested  Would patient like information on creating a medical advance directive? -   Chief Complaint  Patient presents with  . Medical Management of Chronic Issues    29mh follow-up    HPI: Patient is a 84y.o. male seen today for medical management of chronic diseases.    He has a colonoscopy coming up due to changes in his bowel habits.    He had skin cancer removed on his left jaw region.    CKD:  Reduced function but remains stable.  Hypothyroidism:  tsh now in range.  Takes his medication as directed.  hba1c is better--now in normal range.  He was getting swelling in his ankles.  He's given up salt due to this.  It has helped.  Cholesterol was ok in may.  They have a little ice cream periodically.  A little scoop.  He's lost a lot of weight.  Weight has been stable since we met.  He eats well.  He's 190-194 at home.  His goal is 190.    We discussed the [facility] article they provided me about the ace inhibitor.    He reports his mood is fine.  His wife takes exception to that.  He gets frustrated very easily.  The zoloft has not helped and he might even be worse.  He had threatened to leave the room if she talked about it.  He even gets frustrated with traffic lights not changing.  He sleeps terrific with trazodone.  His wife reports it takes him 4 seconds to fall asleep.    His wife reports his driving is concerning.  He only drives short distances to familiar places like for haircuts and to the dry cleaners.  He no longer drives on the highway though he  talks about going other places which worries her.  She did not explain a specific example of concerning behavior.  He asked why his driving would be worrisome and we discussed that changes on memory testing can suggest visuospatial deficits (he also has retinopathy) and fears about getting lost.  I provided them with the dementia and driving handout.  His wife will discuss with their daughters.  Past Medical History:  Diagnosis Date  . Anxiety   . BRADYCARDIA 11/13/2008  . Cataract   . Cholelithiasis   . COLONIC POLYPS, HX OF 02/04/2007  . DEPRESSION 02/04/2007  . DIABETES MELLITUS, TYPE II 02/04/2007  . DIVERTICULOSIS, COLON 02/04/2007  . FOREIGN BODY, ASPIRATION 12/04/2007  . GERD (gastroesophageal reflux disease)   . GLAUCOMA 03/08/2009  . Hepatitis A 1963  . HYPERLIPIDEMIA 12/04/2007  . HYPERTENSION 02/04/2007  . Memory loss   . OBSTRUCTIVE SLEEP APNEA 11/05/2009   -PSG 01/05/10 RDI 11, PLMI 56  . OSTEOARTHRITIS 02/04/2007  . Peptic stricture of esophagus   . PROSTATE CANCER 1997  . RENAL INSUFFICIENCY 11/13/2008  . SYNCOPE 12/31/2009  . THROMBOCYTOPENIA 04/17/2008  . THYROID NODULE, RIGHT 11/13/2008  . UNSPECIFIED ANEMIA 11/07/2007    Past Surgical History:  Procedure Laterality Date  . Heart Cartherization  2000  . PROSTATECTOMY  1997  .  TONSILLECTOMY  1940's    Allergies  Allergen Reactions  . Lactose Intolerance (Gi)   . Sulfonamide Derivatives     REACTION: Itch/Red Spots    Outpatient Encounter Medications as of 05/24/2018  Medication Sig  . Cholecalciferol (VITAMIN D) 2000 UNITS CAPS Take 1 capsule by mouth daily.    . ferrous sulfate 325 (65 FE) MG EC tablet Take 325 mg by mouth daily.  Marland Kitchen levothyroxine (SYNTHROID, LEVOTHROID) 25 MCG tablet Take 1 tablet (25 mcg total) by mouth daily before breakfast.  . lisinopril (PRINIVIL,ZESTRIL) 5 MG tablet TAKE ONE TABLET BY MOUTH ONE TIME DAILY   . rosuvastatin (CRESTOR) 40 MG tablet Take one tablet by mouth once daily  . traZODone  (DESYREL) 100 MG tablet take 1 tablet by mouth at bedtime  . [DISCONTINUED] sertraline (ZOLOFT) 25 MG tablet Take 1 tablet (25 mg total) by mouth daily.  Marland Kitchen escitalopram (LEXAPRO) 10 MG tablet Take 1 tablet (10 mg total) by mouth daily.   No facility-administered encounter medications on file as of 05/24/2018.     Review of Systems:  Review of Systems  Constitutional: Negative for chills, fever and malaise/fatigue.  Eyes: Negative for blurred vision.       Glasses, retinopathy  Respiratory: Negative for shortness of breath.   Cardiovascular: Negative for chest pain and palpitations.  Gastrointestinal: Positive for constipation and diarrhea. Negative for blood in stool, melena, nausea and vomiting.  Genitourinary: Negative for dysuria.  Musculoskeletal: Negative for falls and joint pain.  Neurological: Negative for dizziness and loss of consciousness.  Psychiatric/Behavioral: Positive for memory loss. The patient is not nervous/anxious and does not have insomnia.        Frustration more than anything else, irritability    Health Maintenance  Topic Date Due  . FOOT EXAM  02/25/2018  . OPHTHALMOLOGY EXAM  07/12/2018  . HEMOGLOBIN A1C  11/14/2018  . TETANUS/TDAP  12/03/2026  . INFLUENZA VACCINE  Completed  . PNA vac Low Risk Adult  Completed    Physical Exam: Vitals:   05/24/18 1008  BP: 122/60  Pulse: (!) 57  Temp: 98.5 F (36.9 C)  TempSrc: Oral  SpO2: 95%  Weight: 200 lb (90.7 kg)  Height: '5\' 11"'$  (1.803 m)   Body mass index is 27.89 kg/m. Physical Exam  Labs reviewed: Basic Metabolic Panel: Recent Labs    11/22/17 1003 03/08/18 1230 03/30/18 1203 05/16/18 0700  NA 138 140 139 143  K 4.6 5.1 4.9 4.7  CL 106 106 106  --   CO2 '26 28 27  '$ --   GLUCOSE 183* 81 89  --   BUN 41* 34* 36* 39*  CREATININE 1.89* 1.82* 1.74* 1.8*  CALCIUM 8.8 9.4 9.4  --   TSH 4.60*  --  3.58  --    Liver Function Tests: Recent Labs    05/16/18 0700  AST 27  ALT 23  ALKPHOS 66    No results for input(s): LIPASE, AMYLASE in the last 8760 hours. No results for input(s): AMMONIA in the last 8760 hours. CBC: Recent Labs    11/22/17 1003 03/08/18 1230 05/16/18 0700  WBC 8.9 9.7 8.5  NEUTROABS 5.5 5,519  --   HGB 12.8* 13.1* 13.9  HCT 37.5* 38.3* 41  MCV 93.9 94.1  --   PLT 116.0* 120* 122*   Lipid Panel: Recent Labs    11/22/17 1003  CHOL 119  HDL 29.10*  LDLCALC 57  TRIG 163.0*  CHOLHDL 4   Lab Results  Component Value Date   HGBA1C 5.7 (A) 05/16/2018    Assessment/Plan 1. Depression, major, single episode, mild ([place]) - d/c zoloft - try lexapro instead for his irritability and frustration - escitalopram (LEXAPRO) 10 MG tablet; Take 1 tablet (10 mg total) by mouth daily.  Dispense: 30 tablet; Refill: 3  2. Change in bowel habits -for cscope coming up--await results -if normal, may pursue changing ace to arb   3. Diverticulosis large intestine w/o perforation or abscess w/o bleeding -noted, no diverticulitis recently  4. Controlled type 2 diabetes mellitus with both eyes affected by mild nonproliferative retinopathy without macular edema, with long-term current use of insulin (HCC) -well controlled with hba1c of 5.7 this month -foot exam done today -has had eye exam -is on ace inhibitor  5. Hypothyroidism, unspecified type -continues on levothyroxine--tsh at goal  6. Driving safety issue -per his wife, she and daughters are concerned -see above in hpi -did provide literature about this and talked to patient some about our concerns, but he currently wants to continue short distance driving to familiar places -his daughter did buy him an ID bracelet which was a fabulous idea  Labs/tests ordered:  No orders of the defined types were placed in this encounter.  Next appt:  07/05/2018    Cyndra Feinberg L. Dandrea Medders, D.O. [facility] [address] [place],  [address] Cell Phone (Mon-Fri 8am-5pm):   [phone] On Call:  [phone] & follow prompts after 5pm & weekends Office Phone:  [phone] Office Fax:  [phone]

## 2018-05-29 ENCOUNTER — Ambulatory Visit: Payer: Medicare Other | Admitting: Endocrinology

## 2018-05-30 ENCOUNTER — Encounter: Payer: Self-pay | Admitting: Gastroenterology

## 2018-05-30 ENCOUNTER — Ambulatory Visit (AMBULATORY_SURGERY_CENTER): Payer: Medicare Other | Admitting: Gastroenterology

## 2018-05-30 VITALS — BP 145/57 | HR 57 | Temp 97.1°F | Resp 18 | Ht 71.0 in | Wt 199.0 lb

## 2018-05-30 DIAGNOSIS — R194 Change in bowel habit: Secondary | ICD-10-CM | POA: Diagnosis not present

## 2018-05-30 DIAGNOSIS — K633 Ulcer of intestine: Secondary | ICD-10-CM | POA: Diagnosis not present

## 2018-05-30 DIAGNOSIS — K5669 Other partial intestinal obstruction: Secondary | ICD-10-CM

## 2018-05-30 DIAGNOSIS — K648 Other hemorrhoids: Secondary | ICD-10-CM

## 2018-05-30 DIAGNOSIS — K6289 Other specified diseases of anus and rectum: Secondary | ICD-10-CM | POA: Diagnosis not present

## 2018-05-30 DIAGNOSIS — E119 Type 2 diabetes mellitus without complications: Secondary | ICD-10-CM | POA: Diagnosis not present

## 2018-05-30 DIAGNOSIS — G4733 Obstructive sleep apnea (adult) (pediatric): Secondary | ICD-10-CM | POA: Diagnosis not present

## 2018-05-30 DIAGNOSIS — I10 Essential (primary) hypertension: Secondary | ICD-10-CM | POA: Diagnosis not present

## 2018-05-30 DIAGNOSIS — K5730 Diverticulosis of large intestine without perforation or abscess without bleeding: Secondary | ICD-10-CM | POA: Diagnosis not present

## 2018-05-30 MED ORDER — SODIUM CHLORIDE 0.9 % IV SOLN: 500.0000 mL | Freq: Once | INTRAVENOUS | Status: DC

## 2018-05-30 NOTE — Progress Notes (Signed)
Pt's states no medical or surgical changes since previsit or office visit. 

## 2018-05-30 NOTE — Progress Notes (Signed)
Pt continue to pass flatus while sitting on the toilet.  He reported the Left Lower Abdominal pain is now gone, he reports LLQ soreness.  He requested to sit on the toilet a little longer.  Reported update to Dr. Rush Landmark.  maw

## 2018-05-30 NOTE — Progress Notes (Signed)
Patient after procedure had significant abdominal discomfort in the LLQ and the midepigastirum.  He was shaking and felt unwell.  Tympanic to percusion but still had softness throughout the abdominal cavity. He was able to go to restroom and pass significant gas and felt 99% better. Upon evaluation prior to discharge, I discussed with the patient and wife about symptoms that I would be concerned about should they persist or become more significant. His abdominal exam showed a soft, minimally tender abdomen in the LLQ region. But otherwise significantly less distended than prior. No rebound or guarding appreciated. They are aware if progressive pain develops or patient develops fevers or chills that they should call our office or our oncall MD and that they should come to the ED for further evaluation. Based on his ability to pass gas with improvement in his abdominal distention, the colon's ability to maintain gas as it did and the endoscopic evaluation, I have low-probability of perforation but not 0%. If he has symptoms and presents then needs to have Flat/Upright KUB and if there remains issues then proceed with possible CT-AP to evaluate the region to ensure no perforation. I will call patient tomorrow to see how he is doing. OK for full liquid diet today and if OK then proceed with normal diet thereafter.  Justice Britain, MD [facility] Office # [phone]

## 2018-05-30 NOTE — Progress Notes (Signed)
Report to RN, VSS, adequate respirations noted, no c/o pain or discomfort 

## 2018-05-30 NOTE — Op Note (Signed)
[facility] Patient Name: [name] Procedure Date: 05/30/2018 9:21 AM MRN: [id number] Endoscopist:  Mansouraty , MD Age: 84 Referring MD:  Date of Birth: 11/02/1927 Gender: Male Account #: [id number] Procedure:                Colonoscopy Indications:              Change in bowel habits, Change in stool caliber,                            Obstipation Medicines:                Monitored Anesthesia Care Procedure:                Pre-Anesthesia Assessment:                           - Prior to the procedure, a History and Physical                            was performed, and patient medications and                            allergies were reviewed. The patient's tolerance of                            previous anesthesia was also reviewed. The risks                            and benefits of the procedure and the sedation                            options and risks were discussed with the patient.                            All questions were answered, and informed consent                            was obtained. Prior Anticoagulants: The patient has                            taken no previous anticoagulant or antiplatelet                            agents. ASA Grade Assessment: III - A patient with                            severe systemic disease. After reviewing the risks                            and benefits, the patient was deemed in                            satisfactory condition to undergo the procedure.                             After obtaining informed consent, the colonoscope                            was passed under direct vision. Throughout the                            procedure, the patient's blood pressure, pulse, and                            oxygen saturations were monitored continuously. The                            Colonoscope was introduced through the anus and                            advanced to the the transverse colon. The                          colonoscopy was technically difficult and complex                            due to multiple diverticula in the colon and                            restricted mobility of the colon from the                            diverticular disease. We were not able to perform                            complete colonoscopy. We attempted to change                            patient's position, changing the patient to a near                            supine and prone and supine position, using manual                            pressure, withdrawing and reinserting the scope,                            withdrawing the scope and replacing with the                            pediatric colonoscope and then to the adult                            endoscope, straightening and shortening the scope                            to obtain bowel loop reduction and using scope  torsion. The adult endoscope was able to traverse                            the region, but due to the looping in the distal                            colon could not complete entire colonoscopy. Scope In: 9:29:44 AM Scope Out: 10:03:26 AM Total Procedure Duration: 0 hours 33 minutes 42 seconds  Findings:                 The digital rectal exam findings include                            non-thrombosed internal hemorrhoids. Pertinent                            negatives include no palpable rectal lesions.                           Many small and large-mouthed diverticula were found                            in the recto-sigmoid colon, sigmoid colon,                            descending colon and transverse colon. This led to                            restriction of the colon wall. As noted above, only                            able to traverse this region with the adult                            endoscope (adult and pediatric colonoscopes did not                            pass  through the region)                           A benign-appearing, intrinsic moderate stenosis                            measuring 9 mm (inner diameter) was found in the                            descending colon and was traversed. This is in the                            region of significant diverticular disease                            (traversed only with adult endoscope).                             A small non-bleeding mucosal rent was found, that                            likely occurred during the procedure was found in                            the descending colon near a diverticulum. This was                            mucosally based and small in size.                           Normal mucosa was found in the entire examined                            colon. Biopsies for histology were taken with a                            cold forceps from the transverse colon, descending                            colon, sigmoid colon and rectosigmoid colon for                            evaluation of microscopic colitis. Biopsies were                            taken with a cold forceps for histology from the                            rectum to rule out proctitis.                           Non-bleeding non-thrombosed internal hemorrhoids                            were found during retroflexion, during perianal                            exam and during digital exam. The hemorrhoids were                            Grade II (internal hemorrhoids that prolapse but                            reduce spontaneously). Complications:            No immediate complications. Estimated Blood Loss:     Estimated blood loss was minimal. Impression:               - Non-thrombosed internal hemorrhoids found on                            digital rectal exam.                           -   Diverticulosis in the recto-sigmoid colon, in the                            sigmoid colon, in the descending colon  and in the                            transverse colon. This led to restriction of the                            colon and difficulty with passage of the                            colonoscope and the pediatric colonoscope. Only                            traversed this stenosis/region with the adult                            endoscope.                           - Incidental mucosal tear in the descending colon                            in region of a diverticula, this is near the                            difficult to pass region..                           - Normal mucosa in the entire examined colon                            otherwise. Biopsied to rule out Microscopic                            colitis. Separate jar of rectal biopsies to rule                            out proctitis.                           - Non-bleeding non-thrombosed internal hemorrhoids. Recommendation:           - The patient will be observed post-procedure,                            until all discharge criteria are met.                           - Discharge patient to home.                           - Patient has a contact number available for  emergencies. The signs and symptoms of potential                            delayed complications were discussed with the                            patient. Return to normal activities tomorrow.                            Written discharge instructions were provided to the                            patient.                           - High fiber diet.                           - Continue Fiber supplementation as already on.                           - Await pathology results.                           - Repeat colonoscopy is not recommended.                           - Follow up in clinic as already scheduled for                            further workup. Would proceed with SIBO testing if                            not already  completed as an outpatient.                           - The findings and recommendations were discussed                            with the patient.                           - The findings and recommendations were discussed                            with the patient's family.  Mansouraty, MD 05/30/2018 10:18:06 AM 

## 2018-05-30 NOTE — Progress Notes (Signed)
Pt instructed by Dr Rush Landmark to remain on liquids and advance to soft foods today .

## 2018-05-30 NOTE — Progress Notes (Signed)
1132- Dr Rush Landmark back in to examine pt and ok'd pt to be discharged home.

## 2018-05-30 NOTE — Patient Instructions (Signed)
INFORMATION ON DIVERTICULOSIS GIVEN TO YOU TODAY  CONTINUE FIBER SUPPLEMENTATION AS ALREADY ON AND HIGH FIBER DIET   AWAIT PATHOLOGY RESULTS ON BIOPSIES TAKEN  INFORMATION ON HEMORRHOIDS GIVEN TO YOU TODAY    YOU HAD AN ENDOSCOPIC PROCEDURE TODAY AT [facility]:   Refer to the procedure report that was given to you for any specific questions about what was found during the examination.  If the procedure report does not answer your questions, please call your gastroenterologist to clarify.  If you requested that your care partner not be given the details of your procedure findings, then the procedure report has been included in a sealed envelope for you to review at your convenience later.  YOU SHOULD EXPECT: Some feelings of bloating in the abdomen. Passage of more gas than usual.  Walking can help get rid of the air that was put into your GI tract during the procedure and reduce the bloating. If you had a lower endoscopy (such as a colonoscopy or flexible sigmoidoscopy) you may notice spotting of blood in your stool or on the toilet paper. If you underwent a bowel prep for your procedure, you may not have a normal bowel movement for a few days.  Please Note:  You might notice some irritation and congestion in your nose or some drainage.  This is from the oxygen used during your procedure.  There is no need for concern and it should clear up in a day or so.  SYMPTOMS TO REPORT IMMEDIATELY:   Following lower endoscopy (colonoscopy or flexible sigmoidoscopy):  Excessive amounts of blood in the stool  Significant tenderness or worsening of abdominal pains  Swelling of the abdomen that is new, acute  Fever of 100F or higher   Following upper endoscopy (EGD)  Vomiting of blood or coffee ground material  New chest pain or pain under the shoulder blades  Painful or persistently difficult swallowing  New shortness of breath  Fever of 100F or higher  Black, tarry-looking  stools  For urgent or emergent issues, a gastroenterologist can be reached at any hour by calling [phone].   DIET:  We do recommend a small meal at first, but then you may proceed to your regular diet.  Drink plenty of fluids but you should avoid alcoholic beverages for 24 hours.  ACTIVITY:  You should plan to take it easy for the rest of today and you should NOT DRIVE or use heavy machinery until tomorrow (because of the sedation medicines used during the test).    FOLLOW UP: Our staff will call the number listed on your records the next business day following your procedure to check on you and address any questions or concerns that you may have regarding the information given to you following your procedure. If we do not reach you, we will leave a message.  However, if you are feeling well and you are not experiencing any problems, there is no need to return our call.  We will assume that you have returned to your regular daily activities without incident.  If any biopsies were taken you will be contacted by phone or by letter within the next 1-3 weeks.  Please call us at [phone] if you have not heard about the biopsies in 3 weeks.    SIGNATURES/CONFIDENTIALITY: You and/or your care partner have signed paperwork which will be entered into your electronic medical record.  These signatures attest to the fact that that the information above on  your After Visit Summary has been reviewed and is understood.  Full responsibility of the confidentiality of this discharge information lies with you and/or your care-partner.

## 2018-05-30 NOTE — Progress Notes (Signed)
Called to room to assist during endoscopic procedure.  Patient ID and intended procedure confirmed with present staff. Received instructions for my participation in the procedure from the performing physician.  

## 2018-05-30 NOTE — Progress Notes (Signed)
Pt walked around unit x1,then to bathroom and passed lg amt of flatus and feeling relief.

## 2018-05-30 NOTE — Progress Notes (Signed)
DR Mansouraty spoke with pt and pt's wife regarding possibility of a colon mucosa tear  And wanting to keep pt longer than usual to monitor for discomfort to assure there is not a concern for colon perforation . Pt instructed to try and pass flatus . Assisted by A Willis to turn onto rt side ,warm blankets on pt and 2 Levsin tabs given Sl by A Willis Rn .

## 2018-05-31 ENCOUNTER — Telehealth: Payer: Self-pay | Admitting: Gastroenterology

## 2018-05-31 ENCOUNTER — Telehealth: Payer: Self-pay

## 2018-05-31 NOTE — Telephone Encounter (Signed)
Staff message to call the pt and check on his condition

## 2018-05-31 NOTE — Telephone Encounter (Signed)
  Follow up Call-  Call back number 05/30/2018  Post procedure Call Back phone  # [phone]  Permission to leave phone message Yes  Some recent data might be hidden     Patient questions:  Do you have a fever, pain , or abdominal swelling? No. Pain Score  0 *  Have you tolerated food without any problems? Yes.    Have you been able to return to your normal activities? Yes.    Do you have any questions about your discharge instructions: Diet   No. Medications  No. Follow up visit  No.  Do you have questions or concerns about your Care? No.  Actions: * If pain score is 4 or above: No action needed, pain <4.

## 2018-05-31 NOTE — Telephone Encounter (Signed)
Called and spoke with the patient and wife. Feeling better. Tolerated full liquid diet yesterday and soups. No more discomfort. Had call from RNs this AM and also felt well. Plan to advance diet to softer foods and nothing too fatty or fried. If he is doing well by tomorrow OK to advance diet back to normal. I think risk of post-Flex Sig complication is less likely at this time but still not 0. I will have Advanced RN Gerarda Fraction reach out to patient tomorrow to check on him. Patient and wife appreciative for callback.  Justice Britain, MD [facility] Office # [phone]

## 2018-06-01 ENCOUNTER — Telehealth: Payer: Self-pay

## 2018-06-01 NOTE — Telephone Encounter (Signed)
The pt states he is doing very well and did advance his diet back to normal last night.  He will call if symptoms return.   "Called and spoke with the patient and wife. Feeling better. Tolerated full liquid diet yesterday and soups. No more discomfort. Had call from RNs this AM and also felt well. Plan to advance diet to softer foods and nothing too fatty or fried. If he is doing well by tomorrow OK to advance diet back to normal. I think risk of post-Flex Sig complication is less likely at this time but still not 0. I will have Advanced RN Gerarda Fraction reach out to patient tomorrow to check on him. Patient and wife appreciative for callback."  Justice Britain, MD [facility] Advanced Endoscopy Office # [phone]

## 2018-06-01 NOTE — Telephone Encounter (Signed)
-----   Message from Timothy Lasso, RN sent at 05/31/2018 10:56 AM EST ----- Chong Sicilian, can you check in on Mr. Sarate tomorrow (12/5)?

## 2018-06-01 NOTE — Telephone Encounter (Signed)
Thank you  for update

## 2018-06-02 ENCOUNTER — Encounter: Payer: Self-pay | Admitting: Gastroenterology

## 2018-06-06 ENCOUNTER — Encounter: Payer: Self-pay | Admitting: Neurology

## 2018-06-06 ENCOUNTER — Ambulatory Visit (INDEPENDENT_AMBULATORY_CARE_PROVIDER_SITE_OTHER): Payer: Medicare Other | Admitting: Neurology

## 2018-06-06 VITALS — BP 131/59 | HR 58 | Ht 71.0 in | Wt 203.0 lb

## 2018-06-06 DIAGNOSIS — R413 Other amnesia: Secondary | ICD-10-CM | POA: Diagnosis not present

## 2018-06-06 NOTE — Progress Notes (Signed)
PATIENT: [name] DOB: 02-Feb-1928  Chief Complaint  Patient presents with  . Mild Cognitive Impairment    He is here with his wife, [name], to review his MRI results.  His MMSE on 03/07/18 was 29/30.     HISTORICAL  [name] is a 84 year old male, seen in request by his primary care physician Dr. Mariea Clonts, [name] for evaluation of memory loss, initial evaluation was on March 07, 2018.  I have reviewed and summarized the referring note from the referring physician.  He had a past medical history of hypothyroidism, on supplement, hyperlipidemia, hypertension.  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  He plays bridges regularly, does mild balance exercise 5 days a week, still able to keep his book imbalance  Family history of memory loss  UPDATE Jun 06 2018: He is overall doing well, plays bridges, exercise regularly, continue has mild memory loss, forgets names, dates  We personally reviewed MRI of the brain without contrast on April 17, 2018, mild generalized atrophy, small vessel disease, there was no acute abnormality.  Laboratory evaluation seen 2019 showed normal or negative B12, creatinine was 1.8, A1c 5.7, normal CBC hemoglobin of 13.7, normal TSH  REVIEW OF SYSTEMS: Full 14 system review of systems performed and notable only for as above All other review of systems were negative.  ALLERGIES: Allergies  Allergen Reactions  . Lactose Intolerance (Gi)   . Sulfonamide Derivatives     REACTION: Itch/Red Spots    HOME MEDICATIONS: Current Outpatient Medications  Medication Sig Dispense Refill  . Cholecalciferol (VITAMIN D) 2000 UNITS CAPS Take 1 capsule by mouth daily.      Marland Kitchen escitalopram (LEXAPRO) 10 MG tablet Take 1 tablet (10 mg total) by mouth  daily. 30 tablet 3  . ferrous sulfate 325 (65 FE) MG EC tablet Take 325 mg by mouth daily.    Marland Kitchen levothyroxine (SYNTHROID, LEVOTHROID) 25 MCG tablet Take 1 tablet (25 mcg total) by mouth daily before breakfast. 90 tablet 3  . lisinopril (PRINIVIL,ZESTRIL) 5 MG tablet TAKE ONE TABLET BY MOUTH ONE TIME DAILY  90 tablet 0  . rosuvastatin (CRESTOR) 40 MG tablet Take one tablet by mouth once daily 90 tablet 1  . traZODone (DESYREL) 100 MG tablet take 1 tablet by mouth at bedtime 30 tablet 0   No current facility-administered medications for this visit.     PAST MEDICAL HISTORY: Past Medical History:  Diagnosis Date  . Anxiety   . BRADYCARDIA 11/13/2008  . Cataract   . Cholelithiasis   . COLONIC POLYPS, HX OF 02/04/2007  . DEPRESSION 02/04/2007  . DIABETES MELLITUS, TYPE II 02/04/2007  . DIVERTICULOSIS, COLON 02/04/2007  . FOREIGN BODY, ASPIRATION 12/04/2007  . GERD (gastroesophageal reflux disease)   . GLAUCOMA 03/08/2009  . Hepatitis A 1963  . HYPERLIPIDEMIA 12/04/2007  . HYPERTENSION 02/04/2007  . Memory loss   . OBSTRUCTIVE SLEEP APNEA 11/05/2009   -PSG 01/05/10 RDI 11, PLMI 56  . OSTEOARTHRITIS 02/04/2007  . Peptic stricture of esophagus   . PROSTATE CANCER 1997  . RENAL INSUFFICIENCY 11/13/2008  . SYNCOPE 12/31/2009  . THROMBOCYTOPENIA 04/17/2008  . THYROID NODULE, RIGHT 11/13/2008  . UNSPECIFIED ANEMIA 11/07/2007    PAST SURGICAL HISTORY: Past Surgical History:  Procedure Laterality Date  . Heart Cartherization  [place]  . TONSILLECTOMY  1940's    FAMILY HISTORY: Family History  Problem Relation Age of Onset  . Kidney disease Father        Bright's Disease - died at age 84  . Other Mother        no signifincant health problems - died at age 38  . Heart disease Neg Hx        No FH of Coronary Artery Disease  . Cancer Neg Hx   . Colon cancer Neg Hx   . Esophageal cancer Neg Hx   . Inflammatory bowel disease Neg Hx   . Liver disease Neg Hx   . Pancreatic cancer  Neg Hx   . Rectal cancer Neg Hx   . Stomach cancer Neg Hx     SOCIAL HISTORY: Social History   Socioeconomic History  . Marital status: Married    Spouse name: Not on file  . Number of children: 3  . Years of education: college  . Highest education level: Bachelor's degree (e.g., BA, AB, BS)  Occupational History  . 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 Financial resource strain: Not on file  . Food insecurity:    Worry: Not on file    Inability: Not on file  . Transportation needs:    Medical: Not on file    Non-medical: Not on file  Tobacco Use  . Smoking status: Former Smoker    Types: Cigarettes, Pipe    Last attempt to quit: 07/08/1993    Years since quitting: 24.9  . Smokeless tobacco: Never Used  Substance and Sexual Activity  . Alcohol use: Yes    Comment: minimal  . Drug use: No  . Sexual activity: Not on file  Lifestyle  . Physical activity:    Days per week: Not on file    Minutes per session: Not on file  . Stress: Not on file  Relationships  . Social connections:    Talks on phone: Not on file    Gets together: Not on file    Attends religious service: Not on file    Active member of club or organization: Not on file    Attends meetings of clubs or organizations: Not on file    Relationship status: Not on file  . Intimate partner violence:    Fear of current or ex partner: Not on file    Emotionally abused: Not on file    Physically abused: Not on file    Forced sexual activity: Not on file  Other Topics Concern  . Not on file  Social History Narrative   He played tennis 3x a week before moving to [place].   Right-handed.   2 cups caffeine per day.        PHYSICAL EXAM   Vitals:   06/06/18 1408  BP: (!) 131/59  Pulse: (!) 58  Weight: 203 lb (92.1 kg)  Height: 5\' 11"  (1.803 m)    Not recorded      Body mass index is 28.31 kg/m.  PHYSICAL EXAMNIATION:  Gen: NAD,  conversant, well nourised, obese, well groomed                     Cardiovascular: Regular rate rhythm, no peripheral edema, warm, nontender. Eyes: Conjunctivae clear without exudates or hemorrhage Neck: Supple, no carotid bruits. Pulmonary: Clear to auscultation bilaterally   NEUROLOGICAL EXAM: MMSE - Mini Mental  State Exam 03/07/2018  Orientation to time 5  Orientation to Place 5  Registration 3  Attention/ Calculation 5  Recall 2  Language- name 2 objects 2  Language- repeat 1  Language- follow 3 step command 3  Language- read & follow direction 1  Write a sentence 1  Copy design 1  Total score 29     CRANIAL NERVES: CN II: Visual fields are full to confrontation. Pupils are round equal and briskly reactive to light. CN III, IV, VI: extraocular movement are normal. No ptosis. CN V: Facial sensation is intact to pinprick in all 3 divisions bilaterally. Corneal responses are intact.  CN VII: Face is symmetric with normal eye closure and smile. CN VIII: Hearing is normal to rubbing fingers CN IX, X: Palate elevates symmetrically. Phonation is normal. CN XI: Head turning and shoulder shrug are intact CN XII: Tongue is midline with normal movements and no atrophy.  MOTOR: There is no pronator drift of out-stretched arms. Muscle bulk and tone are normal. Muscle strength is normal.  REFLEXES: Reflexes are 2+ and symmetric at the biceps, triceps, knees, and ankles. Plantar responses are flexor.  SENSORY: Intact to light touch, pinprick, positional sensation and vibratory sensation are intact in fingers and toes.  COORDINATION: Rapid alternating movements and fine finger movements are intact. There is no dysmetria on finger-to-nose and heel-knee-shin.    GAIT/STANCE: He needs pushed up to get up from seated position, cautious, Romberg is absent.   DIAGNOSTIC DATA (LABS, IMAGING, TESTING) - I reviewed patient records, labs, notes, testing and imaging myself where  available.   ASSESSMENT AND PLAN  MAKAI DUMOND is a 84 y.o. male    Mild Cognitive impairment  Mini-Mental status examination 29/30  MRI of the brain showed no significant abnormality,  Continue moderate exercise  [name], M.D. Ph.D.  [facility] [address], [place], [place] [address] Ph: [phone] Fax: [phone]  CC:  Gayland Curry, DO

## 2018-07-03 ENCOUNTER — Telehealth: Payer: Self-pay | Admitting: *Deleted

## 2018-07-03 NOTE — Telephone Encounter (Signed)
Ok, we will accommodate that.  I'll cc Karena Addison so she knows.

## 2018-07-03 NOTE — Telephone Encounter (Signed)
Patient wife called and left message on Clinical intake and stated that patient has an appointment on Wednesday. Patient wife stated that he didn't need to come but she wanted to come by herself and speak with Dr. Mariea Clonts on his behalf because the 2 mood drugs you gave patient is not working.   Tried calling her back and LMOM to return call. For a billable appointment and medication changes patient must be at appointment.

## 2018-07-03 NOTE — Telephone Encounter (Signed)
Patient wife notified and agreed. She will bring patient down for appointment but would like to speak with Dr. Mariea Clonts in private.

## 2018-07-05 ENCOUNTER — Non-Acute Institutional Stay: Payer: Medicare Other | Admitting: Internal Medicine

## 2018-07-05 ENCOUNTER — Encounter: Payer: Self-pay | Admitting: Internal Medicine

## 2018-07-05 VITALS — BP 120/60 | HR 60 | Temp 98.0°F | Ht 71.0 in | Wt 203.0 lb

## 2018-07-05 DIAGNOSIS — Z639 Problem related to primary support group, unspecified: Secondary | ICD-10-CM | POA: Diagnosis not present

## 2018-07-05 DIAGNOSIS — R4189 Other symptoms and signs involving cognitive functions and awareness: Secondary | ICD-10-CM

## 2018-07-05 DIAGNOSIS — R4689 Other symptoms and signs involving appearance and behavior: Secondary | ICD-10-CM

## 2018-07-05 DIAGNOSIS — G3184 Mild cognitive impairment, so stated: Secondary | ICD-10-CM

## 2018-07-05 NOTE — Progress Notes (Signed)
Location:  Occupational psychologist of Service:  Clinic (12)  Provider: Taressa Rauh L. Mariea Clonts, D.O., C.M.D.  Goals of Care:  Advanced Directives 05/24/2018  Does Patient Have a Medical Advance Directive? Yes  Type of Advance Directive Living will;Healthcare Power of Attorney  Does patient want to make changes to medical advance directive? No - Patient declined  Copy of [place] in Chart? No - copy requested  Would patient like information on creating a medical advance directive? -     Chief Complaint  Patient presents with  . Medical Management of Chronic Issues    26mth follow-up    HPI: Patient is a 85 y.o. Richardson seen today for medical management of chronic disease--primarily to follow-up after his neuro appt and to see if his spirits have improved with lexapro.  Pt himself still admits to frustration with things like the computer, new electronics.  His wife reports no improvement and that "his memory is fine" at neurology--seems it was consistent with MCI.      Per [name], any level of frustration, he goes crazy.  He loses it.  He's very rarely in a good mood which is different from his usual.  Does not want to go when it;s time to go out.  Then he enjoys it.  He sighs all of the time.  His wife has had two ulcers and she knows she's till got one and has an endoscopy next week.  She has to get away from him b/c of his moodiness.  He was charming yesterday.  He doesn't want to go out with friends who have illnesses.  She's concerned that he has not managed his money well.  [facility].  He is taking lexapro now and does not follow directions w/o her help.   The children are not helping--two in [place]--one more available than another.  The social worker daughter is in [place].  [place] on mmse.  He likes to be left alone.  MRI was negative.  He struggles with his computer.  He has a short fuse.    Past Medical History:  Diagnosis  Date  . Anxiety   . BRADYCARDIA 11/13/2008  . Cataract   . Cholelithiasis   . COLONIC POLYPS, HX OF 02/04/2007  . DEPRESSION 02/04/2007  . DIABETES MELLITUS, TYPE II 02/04/2007  . DIVERTICULOSIS, COLON 02/04/2007  . FOREIGN BODY, ASPIRATION 12/04/2007  . GERD (gastroesophageal reflux disease)   . GLAUCOMA 03/08/2009  . Hepatitis A 1963  . HYPERLIPIDEMIA 12/04/2007  . HYPERTENSION 02/04/2007  . Memory loss   . OBSTRUCTIVE SLEEP APNEA 11/05/2009   -PSG 01/05/10 RDI 11, PLMI 56  . OSTEOARTHRITIS 02/04/2007  . Peptic stricture of esophagus   . PROSTATE CANCER 1997  . RENAL INSUFFICIENCY 11/13/2008  . SYNCOPE 12/31/2009  . THROMBOCYTOPENIA 04/17/2008  . THYROID NODULE, RIGHT 11/13/2008  . UNSPECIFIED ANEMIA 11/07/2007    Past Surgical History:  Procedure Laterality Date  . Heart Cartherization  2000  . PROSTATECTOMY  1997  . TONSILLECTOMY  1940's    Allergies  Allergen Reactions  . Lactose Intolerance (Gi)   . Sulfonamide Derivatives     REACTION: Itch/Red Spots    Outpatient Encounter Medications as of 07/05/2018  Medication Sig  . Cholecalciferol (VITAMIN D) 2000 UNITS CAPS Take 1 capsule by mouth daily.    Marland Kitchen escitalopram (LEXAPRO) 10 MG tablet Take 1 tablet (10 mg total) by mouth daily.  . ferrous sulfate 325 (65  FE) MG EC tablet Take 325 mg by mouth daily.  Marland Kitchen levothyroxine (SYNTHROID, LEVOTHROID) 25 MCG tablet Take 1 tablet (25 mcg total) by mouth daily before breakfast.  . lisinopril (PRINIVIL,ZESTRIL) 5 MG tablet TAKE ONE TABLET BY MOUTH ONE TIME DAILY   . rosuvastatin (CRESTOR) 40 MG tablet Take one tablet by mouth once daily  . traZODone (DESYREL) 100 MG tablet take 1 tablet by mouth at bedtime   No facility-administered encounter medications on file as of 07/05/2018.     Review of Systems:  Review of Systems  Constitutional: Negative for chills, fever and malaise/fatigue.  HENT: Positive for hearing loss.   Eyes: Negative for blurred vision.  Respiratory: Negative for shortness of  breath.   Cardiovascular: Negative for chest pain, palpitations and leg swelling.  Gastrointestinal: Negative for blood in stool, constipation, diarrhea and melena.  Genitourinary: Negative for dysuria.  Musculoskeletal: Negative for falls and joint pain.  Skin: Negative for itching and rash.  Neurological: Negative for dizziness and loss of consciousness.  Psychiatric/Behavioral: Positive for memory loss. Negative for depression. The patient is not nervous/anxious and does not have insomnia.        Admits to frustration, but denies depression and typical presentation of depression    Health Maintenance  Topic Date Due  . OPHTHALMOLOGY EXAM  07/12/2018  . HEMOGLOBIN A1C  11/14/2018  . FOOT EXAM  05/25/2019  . TETANUS/TDAP  12/03/2026  . INFLUENZA VACCINE  Completed  . PNA vac Low Risk Adult  Completed    Physical Exam: Vitals:   07/05/18 0904  BP: 120/60  Pulse: 60  Temp: 98 F (36.7 C)  TempSrc: Oral  SpO2: 97%  Weight: 203 lb (92.1 kg)  Height: 5\' 11"  (1.803 m)   Body mass index is 28.31 kg/m. Physical Exam Constitutional:      Appearance: Normal appearance.     Comments: Well dressed and groomed  HENT:     Head: Normocephalic and atraumatic.  Cardiovascular:     Rate and Rhythm: Normal rate and regular rhythm.  Pulmonary:     Effort: Pulmonary effort is normal.     Breath sounds: Normal breath sounds.  Musculoskeletal: Normal range of motion.  Skin:    General: Skin is warm and dry.  Neurological:     General: No focal deficit present.     Mental Status: He is alert and oriented to person, place, and time.  Psychiatric:        Mood and Affect: Mood normal.        Behavior: Behavior normal.     Comments: Very cordial and polite, calm and pleasant here     Labs reviewed: Basic Metabolic Panel: Recent Labs    11/22/17 1003 03/08/18 1230 03/30/18 1203 05/16/18 0700  NA 138 140 139 143  K 4.6 5.1 4.9 4.7  CL 106 106 106  --   CO2 26 28 27   --     GLUCOSE 183* 81 89  --   BUN 41* 34* 36* 39*  CREATININE 1.89* 1.82* 1.74* 1.8*  CALCIUM 8.8 9.4 9.4  --   TSH 4.60*  --  3.58  --    Liver Function Tests: Recent Labs    05/16/18 0700  AST 27  ALT 23  ALKPHOS 66   No results for input(s): LIPASE, AMYLASE in the last 8760 hours. No results for input(s): AMMONIA in the last 8760 hours. CBC: Recent Labs    11/22/17 1003 03/08/18 1230 05/16/18  WBC 8.9  9.7 8.5  NEUTROABS 5.5 5,519  --   HGB 12.8* 13.1* 13.9  HCT 37.5* 38.3* 41  MCV 93.9 94.1  --   PLT 116.0* 120* 122*   Lipid Panel: Recent Labs    11/22/17 1003  CHOL 119  HDL 29.10*  LDLCALC 57  TRIG 163.0*  CHOLHDL 4   Lab Results  Component Value Date   HGBA1C 5.7 (A) 05/16/2018   Assessment/Plan 1. Relationship dysfunction -referred to Katy Fitch as first step to seek counseling for them  2. Cognitive and behavioral changes -has some frustration primarily that seems extreme in minor situations from his wife -my initial impression was that this was due to some memory loss so he gets frustrated when he cannot do new things easily, but it happens in other scenarios described by his wife, as well  3. MCI (mild cognitive impairment) with memory loss -scores well on mmse:   MMSE - Mini Mental State Exam 03/07/2018  Orientation to time 5  Orientation to Place 5  Registration 3  Attention/ Calculation 5  Recall 2  Language- name 2 objects 2  Language- repeat 1  Language- follow 3 step command 3  Language- read & follow direction 1  Write a sentence 1  Copy design 1  Total score 29  -neuro thought he was normal, seems like MCI to me  Refer for counseling  Labs/tests ordered:  No new Next appt:  Visit date not found  Talibah Colasurdo L. Kariann Wecker, D.O. [facility] [address], [place] [address] Cell Phone (Mon-Fri 8am-5pm):  [phone] On Call:  [phone] & follow prompts after 5pm & weekends Office  Phone:  [phone] Office Fax:  [phone]

## 2018-07-06 ENCOUNTER — Ambulatory Visit (INDEPENDENT_AMBULATORY_CARE_PROVIDER_SITE_OTHER): Payer: Medicare Other | Admitting: Gastroenterology

## 2018-07-06 ENCOUNTER — Encounter: Payer: Self-pay | Admitting: Gastroenterology

## 2018-07-06 VITALS — BP 140/60 | HR 52 | Ht 71.0 in | Wt 203.0 lb

## 2018-07-06 DIAGNOSIS — R143 Flatulence: Secondary | ICD-10-CM

## 2018-07-06 DIAGNOSIS — R194 Change in bowel habit: Secondary | ICD-10-CM

## 2018-07-06 DIAGNOSIS — K5900 Constipation, unspecified: Secondary | ICD-10-CM

## 2018-07-06 NOTE — Patient Instructions (Signed)
Change back to once daily fibercon next 2-3 weeks  Call to let us know how you are doing.  Thank you for entrusting me with your care and choosing [facility].  Dr Rush Landmark

## 2018-07-06 NOTE — Progress Notes (Signed)
[facility] VISIT   Primary Care Provider Gayland Curry, DO [address] [place] [address] [phone]  Patient Profile: GAR GLANCE is a 85 y.o. male with a pmh significant for Diverticulosis, Prior Colon Polyps, HTN, HLD, Cholelithiasis, MDD/Anxiety, DM, GERD, Prostate Cancer, OSA, Glaucoma, CRI, Self-Reported Lactose Intolerance.  The patient presents to the [facility] for an evaluation and management of problem(s) noted below:  Problem List 1. Flatus   2. Change in bowel habits   3. Constipation, unspecified constipation type     History of Present Illness: Please see prior notes for full details of history of present illness.    Interval History: At the time of his last clinic visit we decided to proceed with a colonoscopy to evaluate the structural integrity of the colon as well as to rule out microscopic/collagenous colitis as a potential etiology for his change in bowel habits and explosive diarrhea and gas.  At the time of his colonoscopy in December he had extremely tortuous and tight sigmoid/descending colon.  After transitioning from an adult colonoscope to a pediatric colonoscope and eventually to an adult endoscope we were finally able to traverse the region.  There was no overt mass or stricture as typically noted but we were not able to complete a full colonoscopy due to the use of the adult endoscope.  Biopsies did not show evidence of collagenous colitis and biopsies of the region that was congested did not show any evidence of mass lesion.  After the colonoscopy attempt he had significant pain eventually ended up passing significant amounts of gas and over the course the next 24 to 36 hours had improvement of his discomforts with no evidence of perforation based on his clinical improvement.  Over the last few weeks, the patient has done extremely well.  He is now feeling like he is having normal bowel movements and  not having significant amounts of flatus or passing significant amounts of explosive diarrhea or gas.  He feels great.  Patient denies any significant changes in his weight and he is eating normally.  No blood in the stool.  GI Review of Systems Positive as above Negative for abdominal pain, dysphagia, melena, hematochezia  Review of Systems General: Denies fevers/chills Cardiovascular: Denies chest pain Pulmonary: Denies shortness of breath Gastroenterological: See HPI Dermatological: Denies jaundice Psychological: Mood is stable and improved since he is feeling better   Medications Current Outpatient Medications  Medication Sig Dispense Refill  . Cholecalciferol (VITAMIN D) 2000 UNITS CAPS Take 1 capsule by mouth daily.      . ferrous sulfate 325 (65 FE) MG EC tablet Take 325 mg by mouth daily.    Marland Kitchen levothyroxine (SYNTHROID, LEVOTHROID) 25 MCG tablet Take 1 tablet (25 mcg total) by mouth daily before breakfast. 90 tablet 3  . lisinopril (PRINIVIL,ZESTRIL) 5 MG tablet TAKE ONE TABLET BY MOUTH ONE TIME DAILY  90 tablet 0  . rosuvastatin (CRESTOR) 40 MG tablet Take one tablet by mouth once daily 90 tablet 1  . traZODone (DESYREL) 100 MG tablet take 1 tablet by mouth at bedtime 30 tablet 0   No current facility-administered medications for this visit.     Allergies Allergies  Allergen Reactions  . Lactose Intolerance (Gi)   . Sulfonamide Derivatives     REACTION: Itch/Red Spots    Histories Past Medical History:  Diagnosis Date  . Anxiety   . BRADYCARDIA 11/13/2008  . Cataract   . Cholelithiasis   . COLONIC  POLYPS, HX OF 02/04/2007  . DEPRESSION 02/04/2007  . DIABETES MELLITUS, TYPE II 02/04/2007  . DIVERTICULOSIS, COLON 02/04/2007  . FOREIGN BODY, ASPIRATION 12/04/2007  . GERD (gastroesophageal reflux disease)   . GLAUCOMA 03/08/2009  . Hepatitis A 1963  . HYPERLIPIDEMIA 12/04/2007  . HYPERTENSION 02/04/2007  . Memory loss   . OBSTRUCTIVE SLEEP APNEA 11/05/2009   -PSG 01/05/10  RDI 11, PLMI 56  . OSTEOARTHRITIS 02/04/2007  . Peptic stricture of esophagus   . PROSTATE CANCER 1997  . RENAL INSUFFICIENCY 11/13/2008  . SYNCOPE 12/31/2009  . THROMBOCYTOPENIA 04/17/2008  . THYROID NODULE, RIGHT 11/13/2008  . UNSPECIFIED ANEMIA 11/07/2007   Past Surgical History:  Procedure Laterality Date  . Heart Cartherization  2000  . PROSTATECTOMY  1997  . TONSILLECTOMY  1940's   Social History   Socioeconomic History  . Marital status: Married    Spouse name: Not on file  . Number of children: 3  . Years of education: college  . Highest education level: Bachelor's degree (e.g., BA, AB, BS)  Occupational History  . 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 Financial resource strain: Not on file  . Food insecurity:    Worry: Not on file    Inability: Not on file  . Transportation needs:    Medical: Not on file    Non-medical: Not on file  Tobacco Use  . Smoking status: Former Smoker    Types: Cigarettes, Pipe    Last attempt to quit: 07/08/1993    Years since quitting: 25.0  . Smokeless tobacco: Never Used  Substance and Sexual Activity  . Alcohol use: Yes    Comment: minimal  . Drug use: No  . Sexual activity: Not on file  Lifestyle  . Physical activity:    Days per week: Not on file    Minutes per session: Not on file  . Stress: Not on file  Relationships  . Social connections:    Talks on phone: Not on file    Gets together: Not on file    Attends religious service: Not on file    Active member of club or organization: Not on file    Attends meetings of clubs or organizations: Not on file    Relationship status: Not on file  . Intimate partner violence:    Fear of current or ex partner: Not on file    Emotionally abused: Not on file    Physically abused: Not on file    Forced sexual activity: Not on file  Other Topics Concern  . Not on file  Social History Narrative   He played tennis 3x a week before  moving to [place].   Right-handed.   2 cups caffeine per day.      Family History  Problem Relation Age of Onset  . Kidney disease Father        Bright's Disease - died at age 106  . Other Mother        no signifincant health problems - died at age 66  . Heart disease Neg Hx        No FH of Coronary Artery Disease  . Cancer Neg Hx   . Colon cancer Neg Hx   . Esophageal cancer Neg Hx   . Inflammatory bowel disease Neg Hx   . Liver disease Neg Hx   . Pancreatic cancer Neg Hx   . Rectal cancer Neg Hx   . Stomach cancer Neg  Hx    I have reviewed his medical, social, and family history in detail and updated the electronic medical record as necessary.    PHYSICAL EXAMINATION  BP 140/60   Pulse (!) 52   Ht 5\' 11"  (1.803 m)   Wt 203 lb (92.1 kg)   BMI 28.31 kg/m  Wt Readings from Last 3 Encounters:  07/06/18 203 lb (92.1 kg)  07/05/18 203 lb (92.1 kg)  06/06/18 203 lb (92.1 kg)  GEN: NAD, appears stated age, doesn't appear chronically ill PSYCH: Cooperative, without pressured speech EYE: Conjunctivae pink, sclerae anicteric ENT: MMM CV: RR without R/Gs  RESP: CTAB posteriorly, without wheezing GI: NABS, soft, NT/ND, without rebound or guarding, no HSM appreciated MSK/EXT: No lower extremity edema SKIN: No jaundice NEURO:  Alert & Oriented x 3, no focal deficits   REVIEW OF DATA  I reviewed the following data at the time of this encounter:  GI Procedures and Studies  December 2019 colonoscopy - Non-thrombosed internal hemorrhoids found on digital rectal exam. - Diverticulosis in the recto-sigmoid colon, in the sigmoid colon, in the descending colon and in the transverse colon. This led to restriction of the colon and difficulty with passage of the colonoscope and the pediatric colonoscope. Only traversed this stenosis/region with the adult endoscope. - Incidental mucosal tear in the descending colon in region of a diverticula, this is near the difficult to pass  region.. - Normal mucosa in the entire examined colon otherwise. Biopsied to rule out Microscopic colitis. Separate jar of rectal biopsies to rule out proctitis. - Non-bleeding non-thrombosed internal hemorrhoids.  Laboratory Studies  Reviewed in epic  Imaging Studies  No new studies to review   ASSESSMENT  Mr. Hegeman is a 85 y.o. male with a pmh significant for Diverticulosis, Prior Colon Polyps, HTN, HLD, Cholelithiasis, MDD/Anxiety, DM, GERD, Prostate Cancer, OSA, Glaucoma, CRI, Self-Reported Lactose Intolerance.  The patient is seen today for evaluation and management of:  1. Flatus   2. Change in bowel habits   3. Constipation, unspecified constipation type    The patient is hemodynamically and clinically better than when we performed his colonoscopy.  The patient feels almost as if things have stretched open a bit that are allowing passage of foodstuffs and his stool much easier than previous.  We still need to work on his constipation a bit but I am wondering if we have overdone his soluble fiber with FiberCon twice daily so I would like to decrease that to once daily.  We will hold on SIBO breath testing as the patient is doing well currently.  If the patient has recurrent symptoms we will consider the role of a colonoscopy with an UltraSlim scope in the hospital-based setting to try and clear the right colon (he currently understands that the right colon has not been visualized but is okay with that).  I would also consider the role of a cross-sectional CT in the future before repeat colonoscopy to evaluate if anything else is concerning in that particular region or other regions of the abdomen.  All patient questions were answered, to the best of my ability, and the patient agrees to the aforementioned plan of action with follow-up as indicated.    PLAN  Decrease FiberCon to once daily Hold on SIBO breath testing for now If patient has recurrent symptoms then I would consider a CT  abdomen pelvis with p.o./IV contrast to evaluate the colon as well as the abdominal cavity and then proceed with colonoscopy at the  hospital with the ultra slim colonoscope to see if we can further evaluate the rest of the right colon entheses for now the patient defers wanting that and is okay and understands that we could be missing things on the right colon but overall he understands this)   No orders of the defined types were placed in this encounter.   New Prescriptions   No medications on file   Modified Medications   No medications on file    Planned Follow Up: No follow-ups on file.   Justice Britain, MD [facility] Office # [phone]

## 2018-07-08 DIAGNOSIS — Z639 Problem related to primary support group, unspecified: Secondary | ICD-10-CM | POA: Insufficient documentation

## 2018-07-08 DIAGNOSIS — G3184 Mild cognitive impairment, so stated: Secondary | ICD-10-CM | POA: Insufficient documentation

## 2018-07-12 ENCOUNTER — Ambulatory Visit: Payer: Medicare Other | Admitting: Gastroenterology

## 2018-07-25 DIAGNOSIS — E113293 Type 2 diabetes mellitus with mild nonproliferative diabetic retinopathy without macular edema, bilateral: Secondary | ICD-10-CM | POA: Diagnosis not present

## 2018-07-25 DIAGNOSIS — H43813 Vitreous degeneration, bilateral: Secondary | ICD-10-CM | POA: Diagnosis not present

## 2018-07-25 DIAGNOSIS — H35373 Puckering of macula, bilateral: Secondary | ICD-10-CM | POA: Diagnosis not present

## 2018-07-25 DIAGNOSIS — D3132 Benign neoplasm of left choroid: Secondary | ICD-10-CM | POA: Diagnosis not present

## 2018-07-25 DIAGNOSIS — Z961 Presence of intraocular lens: Secondary | ICD-10-CM | POA: Diagnosis not present

## 2018-08-08 ENCOUNTER — Telehealth: Payer: Self-pay | Admitting: *Deleted

## 2018-08-08 NOTE — Telephone Encounter (Signed)
Patient called and stated that he requested a sample for an adult diaper from [facility]. They stated that the request has to come from the Dr. Gabriel Carina and he wants [place] to call #[phone] to request this sample for him. Please Advise.

## 2018-08-08 NOTE — Telephone Encounter (Signed)
LMOM for patient to [facility] with size of sample he wants. Awaiting callback.

## 2018-08-08 NOTE — Telephone Encounter (Signed)
Fine with me for bowel and bladder incontinence but we need to know his size he wants.

## 2018-08-08 NOTE — Telephone Encounter (Signed)
Patient stated that he wanted samples in S,M and L

## 2018-08-11 NOTE — Telephone Encounter (Signed)
Called [facility] [phone] and spoke with Gerald Stabs. He stated they have no record/account on patient. They do not provide samples. Their process is for the patient to contact them and then they will send a form to the Provider's office for approval.   [facility] for patient regarding the conversation with [facility]. Informed patient to call them first and then a form would be sent to [place]. Also informed him that they do not provide samples.

## 2018-08-15 ENCOUNTER — Telehealth: Payer: Self-pay | Admitting: *Deleted

## 2018-08-15 NOTE — Telephone Encounter (Signed)
Received fax from [facility] [phone] Fax: [phone] For [facility], Bed Bag and Penile clamp Patient has approved Placed in Dr. Cyndi Lennert folder to review and sign.

## 2018-08-22 ENCOUNTER — Telehealth: Payer: Self-pay | Admitting: *Deleted

## 2018-08-22 NOTE — Telephone Encounter (Signed)
Received fax from [facility], Scarlett Presto [phone] Stating they need documentation Completed: 1-Place patient name on letterhead or scipt. 2-Choose one of the pre approved sentences from list provided 3-Must be originally signed and dated by Provider.   Fax once completed to [phone]  Placed paperwork in Dr. Cyndi Lennert folder to review.

## 2018-08-28 ENCOUNTER — Encounter: Payer: Self-pay | Admitting: Internal Medicine

## 2018-09-05 NOTE — Telephone Encounter (Signed)
Letter Printed and faxed to [employer].

## 2018-10-07 IMAGING — CR DG ABDOMEN 1V
2 series · 2 of 2 positions shown · non-contrast
Comparison: None.

CLINICAL DATA: Constipation with hyperactive bowel sounds

EXAM:
ABDOMEN - 1 VIEW

[t abdomen supine (1 of 2)]
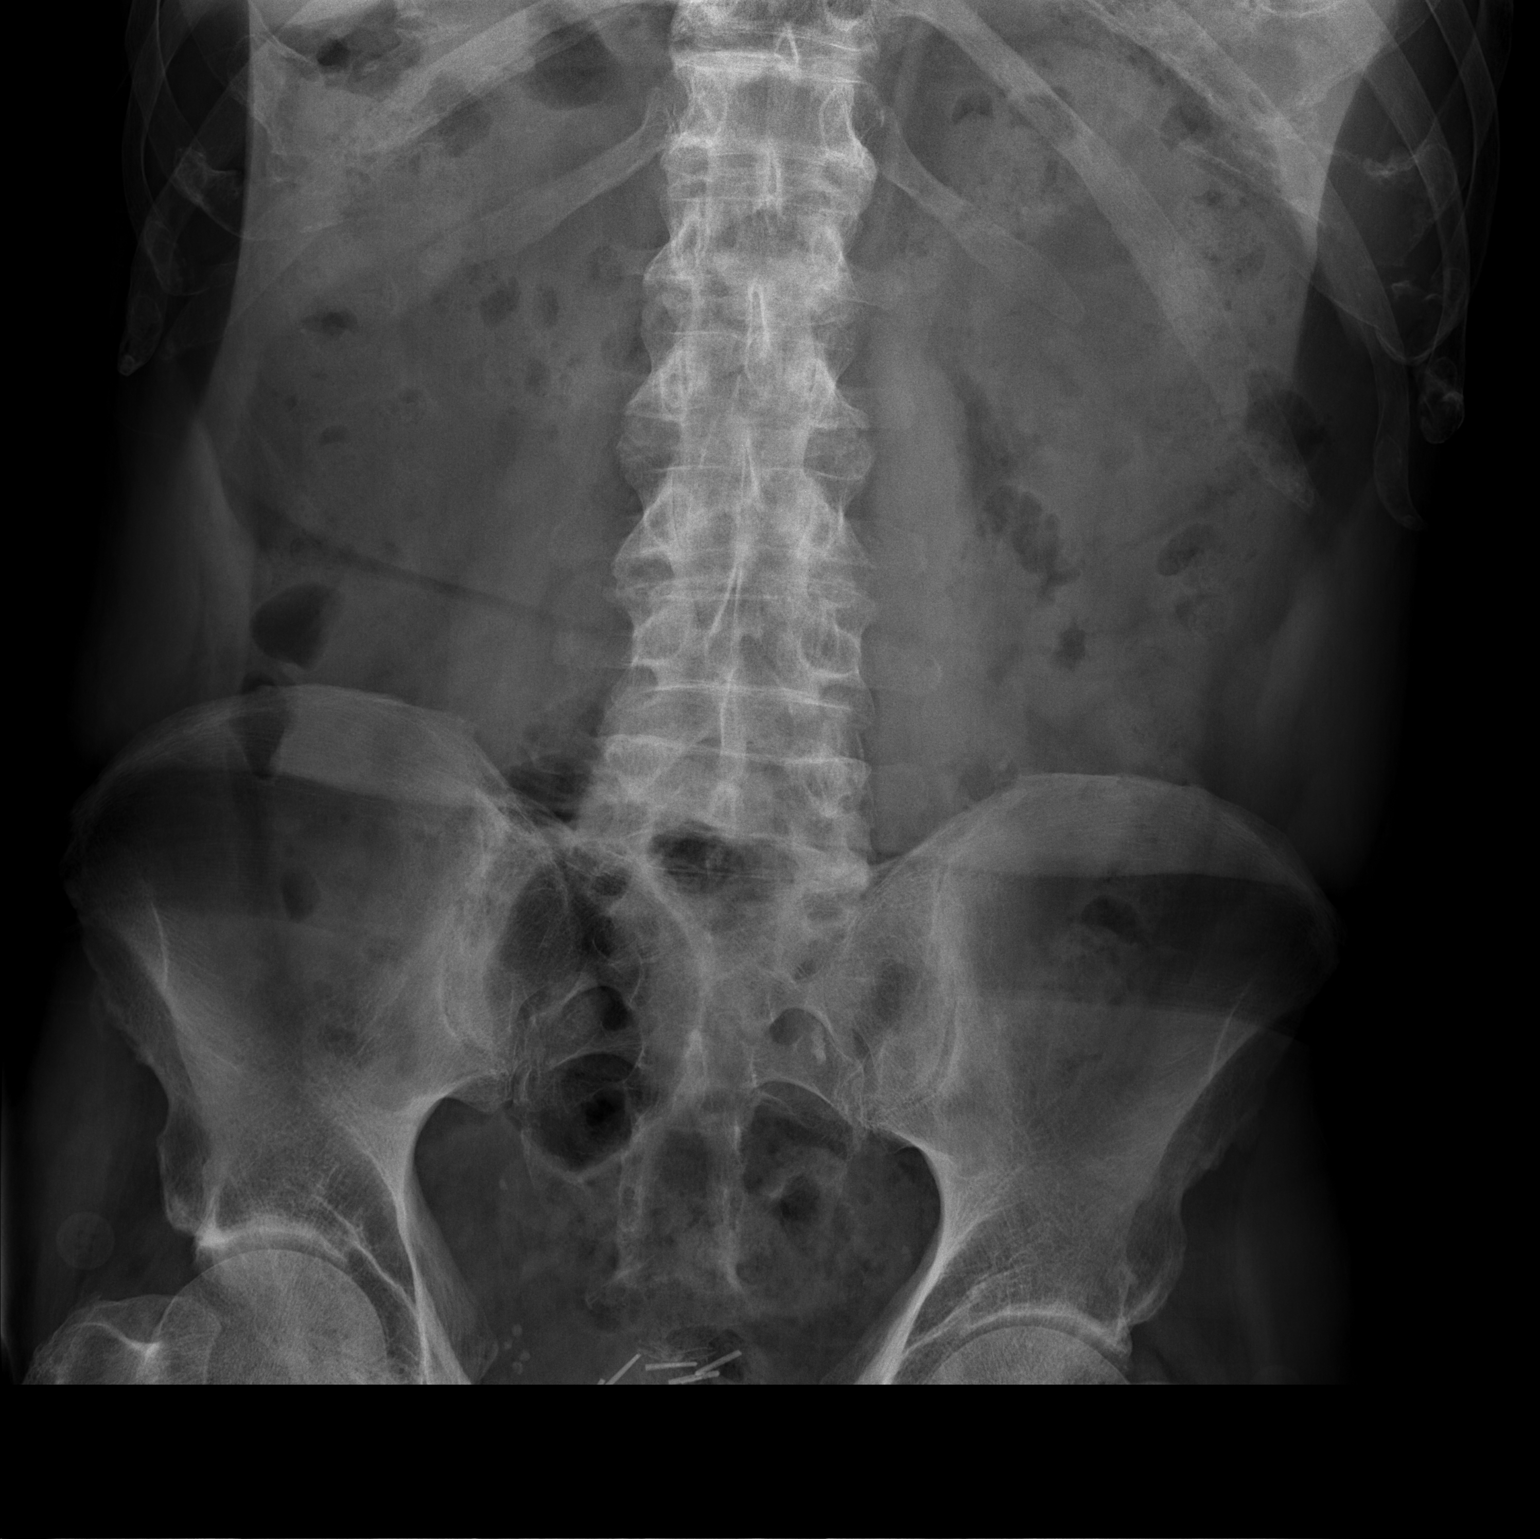

[t abdomen supine (2 of 2)]
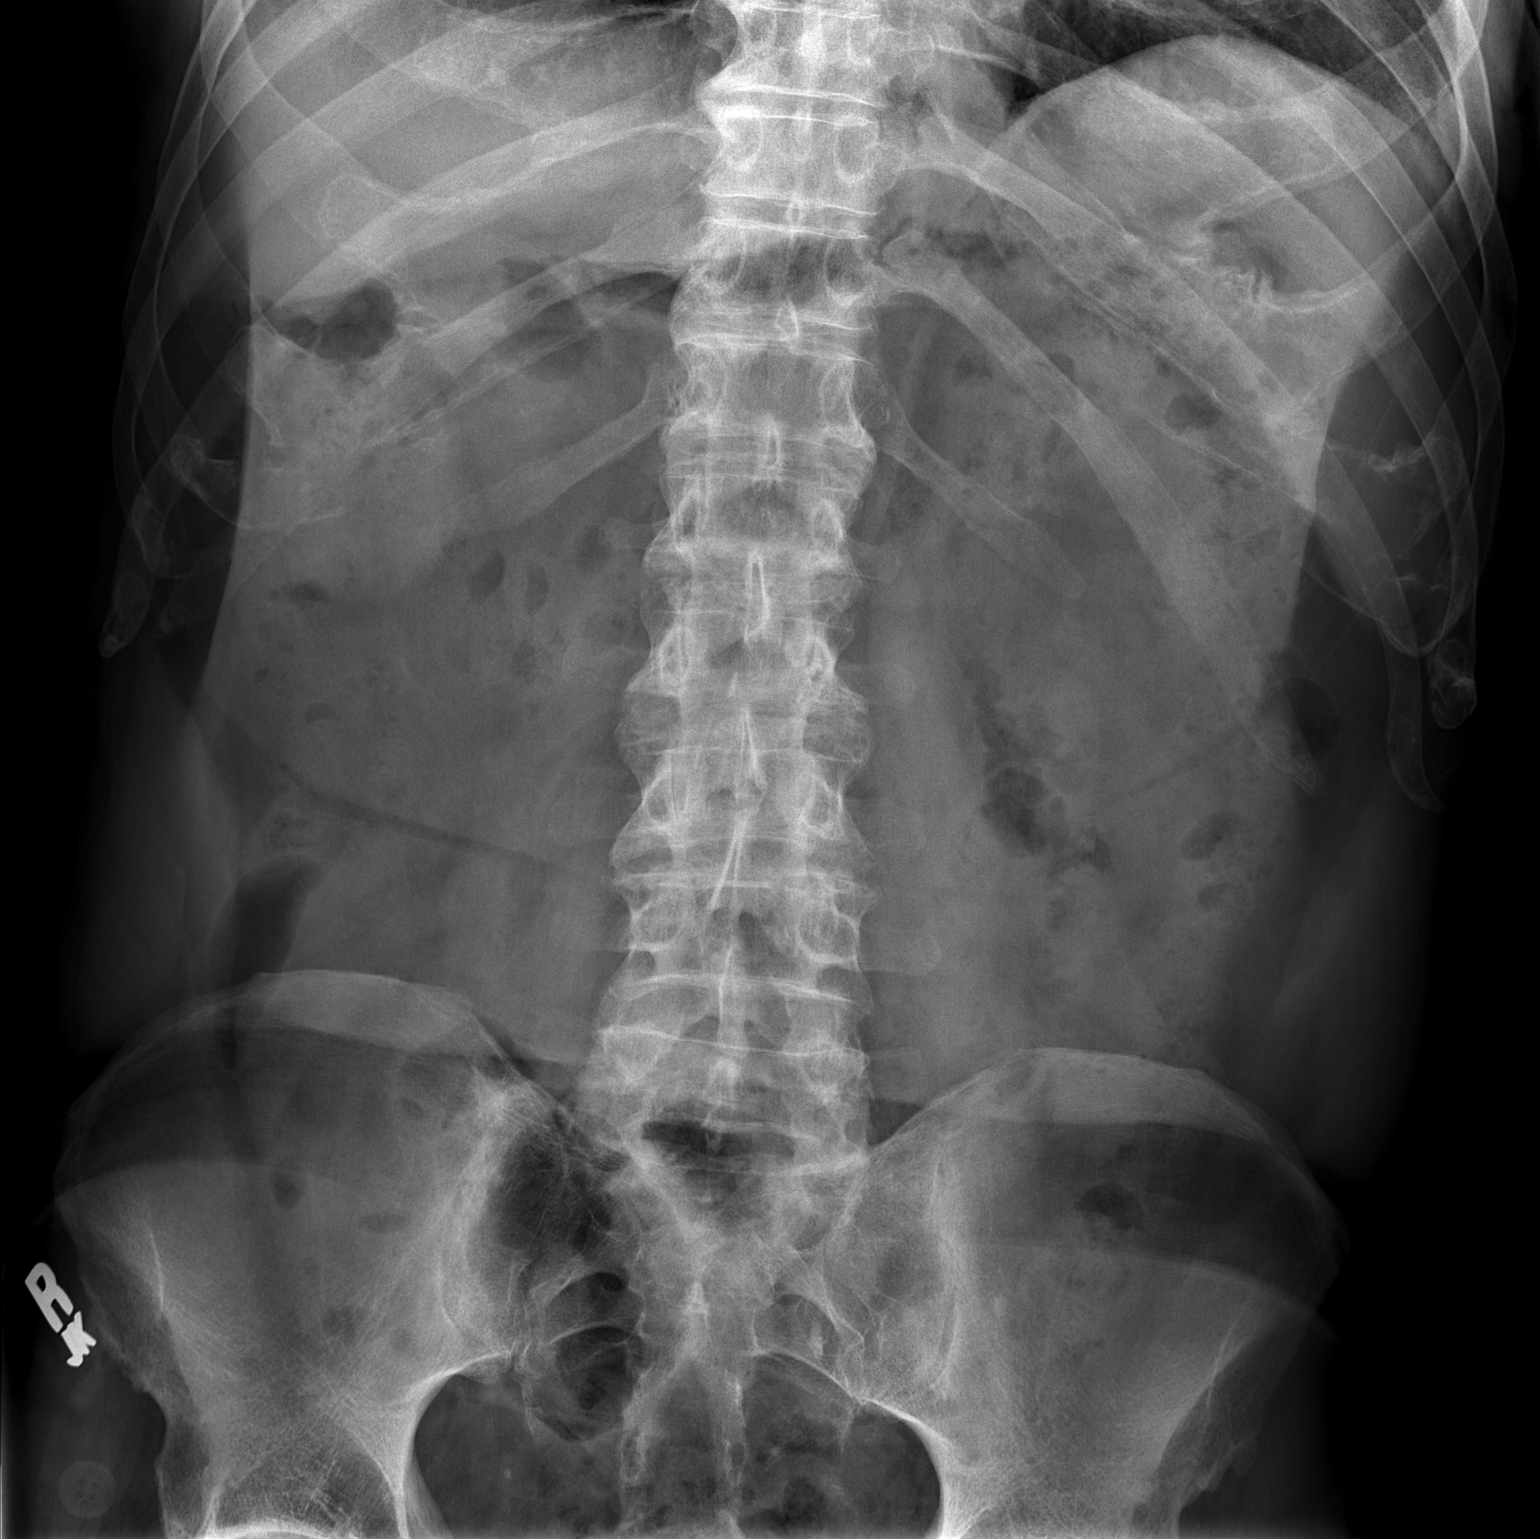

[2 of 2 positions shown; findings below may reference images not displayed]

FINDINGS: Stool seen within the transverse and descending colon primarily. No
colonic or small bowel distension. No fluid levels.

Postoperative pelvis.  No concerning mass effect or calcification.
IMPRESSION: Moderate stool without obstruction or rectal impaction.

## 2018-10-21 ENCOUNTER — Other Ambulatory Visit: Payer: Self-pay | Admitting: Endocrinology

## 2018-10-21 NOTE — Telephone Encounter (Signed)
Please forward refill request to pt's new primary care provider.  

## 2018-10-24 ENCOUNTER — Other Ambulatory Visit: Payer: Self-pay | Admitting: *Deleted

## 2018-10-24 MED ORDER — TRAZODONE HCL 100 MG PO TABS: 100.0000 mg | ORAL_TABLET | Freq: Every day | ORAL | 0 refills | Status: DC

## 2018-10-24 NOTE — Telephone Encounter (Signed)
Refill request from [facility], was previously filled by Dr. Loanne Drilling, is this ok to fill? Is patient taking lexapro?

## 2018-10-25 ENCOUNTER — Other Ambulatory Visit: Payer: Self-pay | Admitting: *Deleted

## 2018-10-25 MED ORDER — TRAZODONE HCL 100 MG PO TABS: 100.0000 mg | ORAL_TABLET | Freq: Every day | ORAL | 0 refills | Status: DC

## 2018-10-25 NOTE — Telephone Encounter (Signed)
Patient called and requested his Trazodone to be sent to [facility] instead of [facility].

## 2018-11-07 ENCOUNTER — Other Ambulatory Visit: Payer: Self-pay | Admitting: Internal Medicine

## 2018-11-08 ENCOUNTER — Other Ambulatory Visit: Payer: Self-pay | Admitting: *Deleted

## 2018-11-08 MED ORDER — LEVOTHYROXINE SODIUM 25 MCG PO TABS: 25.0000 ug | ORAL_TABLET | Freq: Every day | ORAL | 0 refills | Status: DC

## 2018-11-08 NOTE — Telephone Encounter (Signed)
rx sent to pharmacy by e-script Scheduled pt appt

## 2018-11-08 NOTE — Telephone Encounter (Signed)
Received Request Refill from [facility] stated that Dr. Dia Crawford office would not refill it, to send to PCP.   Pended Rx and sent to Dr. Mariea Clonts for approval.

## 2018-11-08 NOTE — Telephone Encounter (Signed)
Please arrange July appt for him.

## 2018-12-08 ENCOUNTER — Ambulatory Visit: Payer: Medicare Other | Admitting: Orthopaedic Surgery

## 2018-12-08 DIAGNOSIS — M545 Low back pain: Secondary | ICD-10-CM | POA: Diagnosis not present

## 2018-12-20 ENCOUNTER — Other Ambulatory Visit: Payer: Self-pay | Admitting: *Deleted

## 2018-12-20 MED ORDER — TRAZODONE HCL 100 MG PO TABS: 100.0000 mg | ORAL_TABLET | Freq: Every day | ORAL | 0 refills | Status: DC

## 2018-12-20 NOTE — Telephone Encounter (Signed)
[name]

## 2019-01-10 ENCOUNTER — Encounter: Payer: Self-pay | Admitting: Internal Medicine

## 2019-01-10 ENCOUNTER — Non-Acute Institutional Stay: Payer: Medicare Other | Admitting: Internal Medicine

## 2019-01-10 ENCOUNTER — Other Ambulatory Visit: Payer: Self-pay

## 2019-01-10 VITALS — BP 120/70 | HR 52 | Temp 97.8°F | Ht 71.0 in | Wt 200.0 lb

## 2019-01-10 DIAGNOSIS — E113293 Type 2 diabetes mellitus with mild nonproliferative diabetic retinopathy without macular edema, bilateral: Secondary | ICD-10-CM

## 2019-01-10 DIAGNOSIS — E785 Hyperlipidemia, unspecified: Secondary | ICD-10-CM

## 2019-01-10 DIAGNOSIS — K5900 Constipation, unspecified: Secondary | ICD-10-CM | POA: Diagnosis not present

## 2019-01-10 DIAGNOSIS — Z794 Long term (current) use of insulin: Secondary | ICD-10-CM

## 2019-01-10 DIAGNOSIS — G3184 Mild cognitive impairment, so stated: Secondary | ICD-10-CM | POA: Diagnosis not present

## 2019-01-10 DIAGNOSIS — C61 Malignant neoplasm of prostate: Secondary | ICD-10-CM | POA: Diagnosis not present

## 2019-01-10 DIAGNOSIS — D696 Thrombocytopenia, unspecified: Secondary | ICD-10-CM

## 2019-01-10 DIAGNOSIS — F320 Major depressive disorder, single episode, mild: Secondary | ICD-10-CM | POA: Diagnosis not present

## 2019-01-10 MED ORDER — TRAZODONE HCL 100 MG PO TABS: 100.0000 mg | ORAL_TABLET | Freq: Every day | ORAL | 5 refills | Status: DC

## 2019-01-10 MED ORDER — BUPROPION HCL 100 MG PO TABS: 100.0000 mg | ORAL_TABLET | Freq: Two times a day (BID) | ORAL | 5 refills | Status: DC

## 2019-01-10 NOTE — Progress Notes (Signed)
Location:  Occupational psychologist of Service:  Clinic (12)  Provider: Serine Kea L. Mariea Clonts, D.O., C.M.D.  Code Status: DNR Goals of Care:  Advanced Directives 05/24/2018  Does Patient Have a Medical Advance Directive? Yes  Type of Advance Directive Living will;Healthcare Power of Attorney  Does patient want to make changes to medical advance directive? No - Patient declined  Copy of [place] in Chart? No - copy requested  Would patient like information on creating a medical advance directive? -     Chief Complaint  Patient presents with  . Medical Management of Chronic Issues    69mth follow-up    HPI: Patient is a 85 y.o. male seen today for medical management of chronic diseases.    "my wife thinks I'm depressed."  He admits he is down in the dumps some.  He wonders if there's a magic pill to help this.  He misses playing bridge and going to the gym which have been curtailed by covid isolation.  He does take trazodone nightly for depression and mood.    He has arthritis in his back, but it's doing better now.  He had a cscope.  Seems like since then, his bowels were straightened out.       Past Medical History:  Diagnosis Date  . Anxiety   . BRADYCARDIA 11/13/2008  . Cataract   . Cholelithiasis   . COLONIC POLYPS, HX OF 02/04/2007  . DEPRESSION 02/04/2007  . DIABETES MELLITUS, TYPE II 02/04/2007  . DIVERTICULOSIS, COLON 02/04/2007  . FOREIGN BODY, ASPIRATION 12/04/2007  . GERD (gastroesophageal reflux disease)   . GLAUCOMA 03/08/2009  . Hepatitis A 1963  . HYPERLIPIDEMIA 12/04/2007  . HYPERTENSION 02/04/2007  . Memory loss   . OBSTRUCTIVE SLEEP APNEA 11/05/2009   -PSG 01/05/10 RDI 11, PLMI 56  . OSTEOARTHRITIS 02/04/2007  . Peptic stricture of esophagus   . PROSTATE CANCER 1997  . RENAL INSUFFICIENCY 11/13/2008  . SYNCOPE 12/31/2009  . THROMBOCYTOPENIA 04/17/2008  . THYROID NODULE, RIGHT 11/13/2008  . UNSPECIFIED ANEMIA 11/07/2007    Past  Surgical History:  Procedure Laterality Date  . Heart Cartherization  2000  . PROSTATECTOMY  1997  . TONSILLECTOMY  1940's    Allergies  Allergen Reactions  . Lactose Intolerance (Gi)   . Sulfonamide Derivatives     REACTION: Itch/Red Spots    Outpatient Encounter Medications as of 01/10/2019  Medication Sig  . Cholecalciferol (VITAMIN D) 2000 UNITS CAPS Take 1 capsule by mouth daily.    . ferrous sulfate 325 (65 FE) MG EC tablet Take 325 mg by mouth daily.  Marland Kitchen levothyroxine (SYNTHROID) 25 MCG tablet Take 1 tablet (25 mcg total) by mouth daily before breakfast.  . lisinopril (PRINIVIL,ZESTRIL) 5 MG tablet TAKE ONE TABLET BY MOUTH ONE TIME DAILY   . rosuvastatin (CRESTOR) 40 MG tablet TAKE ONE TABLET BY MOUTH ONE TIME DAILY  . traZODone (DESYREL) 100 MG tablet Take 1 tablet (100 mg total) by mouth at bedtime.   No facility-administered encounter medications on file as of 01/10/2019.     Review of Systems:  Review of Systems  Constitutional: Positive for malaise/fatigue. Negative for chills and fever.  HENT: Positive for hearing loss.   Eyes: Negative for blurred vision.  Respiratory: Negative for cough and shortness of breath.   Cardiovascular: Negative for chest pain, palpitations and leg swelling.  Gastrointestinal: Negative for abdominal pain.  Genitourinary: Negative for dysuria.  Musculoskeletal: Negative for falls and joint  pain.  Skin: Negative for itching and rash.  Neurological: Negative for dizziness and loss of consciousness.  Endo/Heme/Allergies: Does not bruise/bleed easily.  Psychiatric/Behavioral: Positive for depression and memory loss. The patient is not nervous/anxious and does not have insomnia.     Health Maintenance  Topic Date Due  . OPHTHALMOLOGY EXAM  07/12/2018  . HEMOGLOBIN A1C  11/14/2018  . INFLUENZA VACCINE  01/27/2019  . FOOT EXAM  05/25/2019  . TETANUS/TDAP  12/03/2026  . PNA vac Low Risk Adult  Completed    Physical Exam: Vitals:    01/10/19 1531  BP: 120/70  Pulse: (!) 52  Temp: 97.8 F (36.6 C)  TempSrc: Oral  SpO2: 94%  Weight: 200 lb (90.7 kg)  Height: 5\' 11"  (1.803 m)   Body mass index is 27.89 kg/m. Physical Exam Vitals signs reviewed.  Constitutional:      General: He is not in acute distress.    Appearance: He is normal weight. He is not ill-appearing or toxic-appearing.  HENT:     Head: Normocephalic and atraumatic.     Ears:     Comments: HOH Cardiovascular:     Rate and Rhythm: Normal rate and regular rhythm.     Pulses: Normal pulses.     Heart sounds: Normal heart sounds.  Pulmonary:     Effort: Pulmonary effort is normal.     Breath sounds: Normal breath sounds.  Abdominal:     General: Bowel sounds are normal.  Musculoskeletal: Normal range of motion.  Skin:    General: Skin is warm and dry.  Neurological:     General: No focal deficit present.     Mental Status: He is alert and oriented to person, place, and time. Mental status is at baseline.  Psychiatric:        Mood and Affect: Mood normal.     Labs reviewed: Basic Metabolic Panel: Recent Labs    03/08/18 1230 03/30/18 1203 05/16/18 0700  NA 140 139 143  K 5.1 4.9 4.7  CL 106 106  --   CO2 28 27  --   GLUCOSE 81 89  --   BUN 34* 36* 39*  CREATININE 1.82* 1.74* 1.8*  CALCIUM 9.4 9.4  --   TSH  --  3.58  --    Liver Function Tests: Recent Labs    05/16/18 0700  AST 27  ALT 23  ALKPHOS 66   No results for input(s): LIPASE, AMYLASE in the last 8760 hours. No results for input(s): AMMONIA in the last 8760 hours. CBC: Recent Labs    03/08/18 1230 05/16/18  WBC 9.7 8.5  NEUTROABS 5,519  --   HGB 13.1* 13.9  HCT 38.3* 41  MCV 94.1  --   PLT 120* 122*   Lipid Panel: No results for input(s): CHOL, HDL, LDLCALC, TRIG, CHOLHDL, LDLDIRECT in the last 8760 hours. Lab Results  Component Value Date   HGBA1C 5.7 (A) 05/16/2018    Assessment/Plan 1. Depression, major, single episode, mild ([place]) - he is  taking his trazodone at night and sleeping well, but he's struggling with motivation and his mood overall amid covid isolation - traZODone (DESYREL) 100 MG tablet; Take 1 tablet (100 mg total) by mouth at bedtime.  Dispense: 30 tablet; Refill: 5 - buPROPion (WELLBUTRIN) 100 MG tablet; Take 1 tablet (100 mg total) by mouth 2 (two) times daily.  Dispense: 60 tablet; Refill: 5  2. 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 Thrombocytopenia ([place]) -f/u labs  4. Malignant neoplasm of prostate ([place]) -no new symptoms reported, was using catheters last time that he requested--did not mention any new concerns or needs about this today  5. MCI (mild cognitive impairment) with memory loss -ongoing, had full cognitive eval and felt to be just MCI though reportedly his wife has to remind him of everything--they also have some interpersonal issues  6. Dyslipidemia -cont crestor, f/u labs  7. Constipation, unspecified constipation type -doing ok lately, is on iron which contributes, encouraged hydration, mobility, high fiber diet  Labs/tests ordered:  Cbc, cmp, flp, hba1c, tsh, iron panel Next appt:  6 mos f/u  [name] L. Kerensa Nicklas, D.O. [facility] [address], [place] [address] Cell Phone (Mon-Fri 8am-5pm):  [phone] On Call:  [phone] & follow prompts after 5pm & weekends Office Phone:  [phone] Office Fax:  [phone]

## 2019-01-11 DIAGNOSIS — E785 Hyperlipidemia, unspecified: Secondary | ICD-10-CM | POA: Diagnosis not present

## 2019-01-11 DIAGNOSIS — I10 Essential (primary) hypertension: Secondary | ICD-10-CM | POA: Diagnosis not present

## 2019-01-11 DIAGNOSIS — E1129 Type 2 diabetes mellitus with other diabetic kidney complication: Secondary | ICD-10-CM | POA: Diagnosis not present

## 2019-01-11 DIAGNOSIS — D649 Anemia, unspecified: Secondary | ICD-10-CM | POA: Diagnosis not present

## 2019-01-11 DIAGNOSIS — E039 Hypothyroidism, unspecified: Secondary | ICD-10-CM | POA: Diagnosis not present

## 2019-01-11 LAB — TSH: TSH: 5.42 (ref 0.41–5.90)

## 2019-01-11 LAB — BASIC METABOLIC PANEL
BUN: 39 — AB (ref 4–21)
Creatinine: 1.8 — AB (ref 0.6–1.3)
Glucose: 99
Potassium: 4.8 (ref 3.4–5.3)
Sodium: 141 (ref 137–147)

## 2019-01-11 LAB — LIPID PANEL
Cholesterol: 137 (ref 0–200)
HDL: 36 (ref 35–70)
LDL Cholesterol: 76
Triglycerides: 121 (ref 40–160)

## 2019-01-11 LAB — CBC AND DIFFERENTIAL
HCT: 37 — AB (ref 41–53)
Hemoglobin: 12.8 — AB (ref 13.5–17.5)
Platelets: 112 — AB (ref 150–399)
WBC: 8.6

## 2019-01-11 LAB — IRON,TIBC AND FERRITIN PANEL: Iron: 97

## 2019-01-11 LAB — HEPATIC FUNCTION PANEL
ALT: 15 (ref 10–40)
AST: 19 (ref 14–40)
Bilirubin, Total: 0.5

## 2019-01-11 LAB — HEMOGLOBIN A1C: Hemoglobin A1C: 6.4

## 2019-01-12 ENCOUNTER — Encounter: Payer: Self-pay | Admitting: Internal Medicine

## 2019-01-12 ENCOUNTER — Telehealth: Payer: Self-pay | Admitting: *Deleted

## 2019-01-12 NOTE — Telephone Encounter (Signed)
His urinary incontinence was not discussed at all on Wednesday.  If you look back there are 3 messages with Rodena Piety and there was paperwork done in Feb for supplies from back in February (hopefully it's scanned).  All the forms were done then.  Sounds like you'll need to call him and ask him about his bladder control problems at night.  I think he was using catheters at night.

## 2019-01-12 NOTE — Telephone Encounter (Signed)
Received fax asking for records from 07/29/2017 to present establishing medical necessity for external catheters, the records need to support medical necessity for catheterization due to permanent urinary retention or incontinence?     Dr. Mariea Clonts does this pt use a catheter? I don't see it in the chart. Please advise

## 2019-01-12 NOTE — Telephone Encounter (Signed)
Spoke with patient and he does not use catheters and he has told this company before to stop calling him. I have faxed the company and advised the samething.

## 2019-01-22 ENCOUNTER — Other Ambulatory Visit: Payer: Self-pay

## 2019-01-22 ENCOUNTER — Ambulatory Visit (INDEPENDENT_AMBULATORY_CARE_PROVIDER_SITE_OTHER): Payer: Medicare Other | Admitting: Internal Medicine

## 2019-01-22 ENCOUNTER — Encounter: Payer: Self-pay | Admitting: Internal Medicine

## 2019-01-22 VITALS — BP 130/70 | HR 56 | Temp 98.3°F | Ht 71.0 in | Wt 199.0 lb

## 2019-01-22 DIAGNOSIS — Z6827 Body mass index (BMI) 27.0-27.9, adult: Secondary | ICD-10-CM

## 2019-01-22 DIAGNOSIS — D696 Thrombocytopenia, unspecified: Secondary | ICD-10-CM | POA: Diagnosis not present

## 2019-01-22 DIAGNOSIS — D539 Nutritional anemia, unspecified: Secondary | ICD-10-CM

## 2019-01-22 DIAGNOSIS — F320 Major depressive disorder, single episode, mild: Secondary | ICD-10-CM

## 2019-01-22 DIAGNOSIS — E039 Hypothyroidism, unspecified: Secondary | ICD-10-CM

## 2019-01-22 DIAGNOSIS — E663 Overweight: Secondary | ICD-10-CM

## 2019-01-22 NOTE — Progress Notes (Signed)
Location:  [facility] clinic Provider: Kalib Bhagat L. Mariea Clonts, D.O., C.M.D.  Code Status: DNR Goals of Care:  Advanced Directives 05/24/2018  Does Patient Have a Medical Advance Directive? Yes  Type of Advance Directive Living will;Healthcare Power of Attorney  Does patient want to make changes to medical advance directive? No - Patient declined  Copy of [place] in Chart? No - copy requested  Would patient like information on creating a medical advance directive? -   Chief Complaint  Patient presents with  . Follow-up    discuss lab results    HPI: Patient is a 85 y.o. male seen today for an acute visit to review several abnormal lab results.  He has no new symptoms.  His arthritis pains improve after he gets up and moving each morning.    Cholesterol remains ok, but sugar has trended up to upper end of prediabetic range since he could not do the exercise programs with social isolation at [facility].  The classes have now resumed for small, socially distant groups outdoors and he's going three times weekly since yesterday.  He and his wife walk, but have not much due to the heat--Simone mentions that they can walk inside.    His anemia has worsened considerably--it's macrocytic--differential reviewed with them.  He's agreeable to further tests due to this and his low platelets.    His TSH was elevated slightly per the [facility] lab ranges.  He reports not missing any doses, but his wife questions if that's accurate (shaking her head).  He takes levothyroxine when he awakens to urinate at 4am or so on an empty stomach with a full glass of water.  He agrees to recheck in 6 wks.  Past Medical History:  Diagnosis Date  . Anxiety   . BRADYCARDIA 11/13/2008  . Cataract   . Cholelithiasis   . COLONIC POLYPS, HX OF 02/04/2007  . DEPRESSION 02/04/2007  . DIABETES MELLITUS, TYPE II 02/04/2007  . DIVERTICULOSIS, COLON 02/04/2007  . FOREIGN BODY, ASPIRATION 12/04/2007  . GERD  (gastroesophageal reflux disease)   . GLAUCOMA 03/08/2009  . Hepatitis A 1963  . HYPERLIPIDEMIA 12/04/2007  . HYPERTENSION 02/04/2007  . Memory loss   . OBSTRUCTIVE SLEEP APNEA 11/05/2009   -PSG 01/05/10 RDI 11, PLMI 56  . OSTEOARTHRITIS 02/04/2007  . Peptic stricture of esophagus   . PROSTATE CANCER 1997  . RENAL INSUFFICIENCY 11/13/2008  . SYNCOPE 12/31/2009  . THROMBOCYTOPENIA 04/17/2008  . THYROID NODULE, RIGHT 11/13/2008  . UNSPECIFIED ANEMIA 11/07/2007    Past Surgical History:  Procedure Laterality Date  . Heart Cartherization  2000  . PROSTATECTOMY  1997  . TONSILLECTOMY  1940's    Allergies  Allergen Reactions  . Lactose Intolerance (Gi)   . Sulfonamide Derivatives     REACTION: Itch/Red Spots    Outpatient Encounter Medications as of 01/22/2019  Medication Sig  . buPROPion (WELLBUTRIN) 100 MG tablet Take 1 tablet (100 mg total) by mouth 2 (two) times daily.  . Cholecalciferol (VITAMIN D) 2000 UNITS CAPS Take 1 capsule by mouth daily.    . ferrous sulfate 325 (65 FE) MG EC tablet Take 325 mg by mouth daily.  Marland Kitchen levothyroxine (SYNTHROID) 25 MCG tablet Take 1 tablet (25 mcg total) by mouth daily before breakfast.  . lisinopril (PRINIVIL,ZESTRIL) 5 MG tablet TAKE ONE TABLET BY MOUTH ONE TIME DAILY   . rosuvastatin (CRESTOR) 40 MG tablet TAKE ONE TABLET BY MOUTH ONE TIME DAILY  . traZODone (DESYREL) 100 MG tablet  Take 1 tablet (100 mg total) by mouth at bedtime.   No facility-administered encounter medications on file as of 01/22/2019.     Review of Systems:  Review of Systems  Constitutional: Negative for chills, fever and malaise/fatigue.  HENT: Positive for hearing loss. Negative for congestion, sinus pain and sore throat.   Eyes: Negative for blurred vision.       Glasses  Respiratory: Negative for cough and shortness of breath.   Cardiovascular: Negative for chest pain, palpitations and leg swelling.  Gastrointestinal: Negative for abdominal pain, blood in stool and  melena.  Genitourinary: Negative for dysuria.  Musculoskeletal: Positive for back pain. Negative for falls.  Skin: Negative for itching and rash.  Neurological: Negative for dizziness and loss of consciousness.  Endo/Heme/Allergies: Bruises/bleeds easily.  Psychiatric/Behavioral: Positive for depression and memory loss. The patient is not nervous/anxious and does not have insomnia.     Health Maintenance  Topic Date Due  . OPHTHALMOLOGY EXAM  07/12/2018  . INFLUENZA VACCINE  01/27/2019  . FOOT EXAM  05/25/2019  . HEMOGLOBIN A1C  07/14/2019  . TETANUS/TDAP  12/03/2026  . PNA vac Low Risk Adult  Completed    Physical Exam: Vitals:   01/22/19 1512  BP: 130/70  Pulse: (!) 56  Temp: 98.3 F (36.8 C)  TempSrc: Oral  SpO2: 95%  Weight: 199 lb (90.3 kg)  Height: 5\' 11"  (1.803 m)   Body mass index is 27.75 kg/m. Physical Exam Vitals signs reviewed.  Constitutional:      General: He is not in acute distress.    Appearance: Normal appearance. He is normal weight. He is not toxic-appearing.  HENT:     Head: Normocephalic and atraumatic.  Cardiovascular:     Rate and Rhythm: Normal rate and regular rhythm.     Pulses: Normal pulses.     Heart sounds: Normal heart sounds.  Pulmonary:     Effort: Pulmonary effort is normal.     Breath sounds: Normal breath sounds.  Abdominal:     General: Bowel sounds are normal.  Musculoskeletal: Normal range of motion.  Skin:    General: Skin is warm and dry.  Neurological:     General: No focal deficit present.     Mental Status: He is alert and oriented to person, place, and time. Mental status is at baseline.  Psychiatric:        Mood and Affect: Mood normal.     Labs reviewed: Basic Metabolic Panel: Recent Labs    03/08/18 1230 03/30/18 1203 05/16/18 0700 01/11/19 0200  NA 140 139 143 141  K 5.1 4.9 4.7 4.8  CL 106 106  --   --   CO2 28 27  --   --   GLUCOSE 81 89  --   --   BUN 34* 36* 39* 39*  CREATININE 1.82* 1.74*  1.8* 1.8*  CALCIUM 9.4 9.4  --   --   TSH  --  3.58  --  5.42   Liver Function Tests: Recent Labs    05/16/18 0700 01/11/19 0200  AST 27 19  ALT 23 15  ALKPHOS 66  --    No results for input(s): LIPASE, AMYLASE in the last 8760 hours. No results for input(s): AMMONIA in the last 8760 hours. CBC: Recent Labs    03/08/18 1230 05/16/18 01/11/19 0200  WBC 9.7 8.5 8.6  NEUTROABS 5,519  --   --   HGB 13.1* 13.9 12.8*  HCT 38.3* 41 37*  MCV 94.1  --   --   PLT 120* 122* 112*   Lipid Panel: Recent Labs    01/11/19 0200  CHOL 137  HDL 36  LDLCALC 76  TRIG 121   Lab Results  Component Value Date   HGBA1C 6.4 01/11/2019   Assessment/Plan 1. Macrocytic anemia - likely mixed pathology--r/o b12 deficiency (previously was ok when I checked it when he first switched practices) - is on iron for iron deficiency, has low platelets also so r/o bone marrow process - CBC with Differential/Platelet - Vitamin B12 - Pathologist smear review  2. Thrombocytopenia ([place]) - as noted above, not in dangerous range bleeding risk wise - CBC with Differential/Platelet - Pathologist smear review  3. Overweight with body mass index (BMI) 25.0-29.9 - resume walking indoors b/w chairfit days to help with weight and primarily the hyperglycemia he's had worsen  4. Body mass index 27.0-27.9, adult -counseled on diet and exercise  5. Hypothyroidism, unspecified type -cont levothyroxine--f/u tsh in 6 wks at [facility] due to slight elevation of tsh  6. Depression, major, single episode, mild ([place]) -his wife has not yet noticed a difference in his motivation and mood since the wellbutrin, but just started it -could increase to 150mg  po bid if no effect at 4 wks  Labs/tests ordered: Lab Orders     CBC with Differential/Platelet     Vitamin B12     Pathologist smear review  Next appt:  07/18/2019  Narelle Schoening L. Eula Mazzola, D.O. [facility] [address],  [address] Cell Phone (Mon-Fri 8am-5pm):  [phone] On Call:  [phone] & follow prompts after 5pm & weekends Office Phone:  [phone] Office Fax:  [phone]

## 2019-01-23 LAB — CBC WITH DIFFERENTIAL/PLATELET
Absolute Monocytes: 1404 cells/uL — ABNORMAL HIGH (ref 200–950)
Basophils Absolute: 35 cells/uL (ref 0–200)
Basophils Relative: 0.3 %
Eosinophils Absolute: 0 cells/uL — ABNORMAL LOW (ref 15–500)
Eosinophils Relative: 0 %
HCT: 35.4 % — ABNORMAL LOW (ref 38.5–50.0)
Hemoglobin: 12 g/dL — ABNORMAL LOW (ref 13.2–17.1)
Lymphs Abs: 3767 cells/uL (ref 850–3900)
MCH: 32.1 pg (ref 27.0–33.0)
MCHC: 33.9 g/dL (ref 32.0–36.0)
MCV: 94.7 fL (ref 80.0–100.0)
MPV: 13.2 fL — ABNORMAL HIGH (ref 7.5–12.5)
Monocytes Relative: 12 %
Neutro Abs: 6494 cells/uL (ref 1500–7800)
Neutrophils Relative %: 55.5 %
Platelets: 148 10*3/uL (ref 140–400)
RBC: 3.74 10*6/uL — ABNORMAL LOW (ref 4.20–5.80)
RDW: 12.9 % (ref 11.0–15.0)
Total Lymphocyte: 32.2 %
WBC: 11.7 10*3/uL — ABNORMAL HIGH (ref 3.8–10.8)

## 2019-01-23 LAB — PATHOLOGIST SMEAR REVIEW

## 2019-01-23 LAB — VITAMIN B12: Vitamin B-12: 430 pg/mL (ref 200–1100)

## 2019-01-30 ENCOUNTER — Telehealth: Payer: Self-pay

## 2019-01-30 NOTE — Telephone Encounter (Signed)
So it seems like his mychart should be disabled? Normally I give a copy at [facility] because labs come from [facility]. But pt had viewed on mychart as he or someone has done in the past.

## 2019-01-30 NOTE — Telephone Encounter (Signed)
Patients wife called upset that she has not received her husbands lab results. I discussed labs and Mrs.Conception asked that I mail her a copy (copy mailed). I informed Mrs.Leibling that labs were reviewed on mychart and that is why Dee did not call. Mrs.Leibling asked her husband at the time of call had he viewed labs and he responded no. Mrs.Leibling states she has not viewed labs either.   Mrs.Strothman stated her husband has memory issues and it is possible that he viewed them and forgot yet she would ALWAYS like a call about labs regardless if they are normal or not. Mrs.Glennie asked that I send the labs to her email and I advised that I was unable to do so yet I will mail.

## 2019-01-31 DIAGNOSIS — H903 Sensorineural hearing loss, bilateral: Secondary | ICD-10-CM | POA: Diagnosis not present

## 2019-01-31 DIAGNOSIS — H9113 Presbycusis, bilateral: Secondary | ICD-10-CM | POA: Diagnosis not present

## 2019-02-06 ENCOUNTER — Other Ambulatory Visit: Payer: Self-pay | Admitting: Endocrinology

## 2019-02-06 NOTE — Telephone Encounter (Signed)
Please advise if you wish to manage and refill 

## 2019-02-06 NOTE — Telephone Encounter (Signed)
Please forward refill request to pt's new primary care provider.  

## 2019-02-07 NOTE — Telephone Encounter (Signed)
Spoke with pt and wife and pt does take this medication rx sent to pharmacy by e-script

## 2019-02-07 NOTE — Telephone Encounter (Signed)
Please call pt and find out if he's taking it b/c it looks like he would have been out since November of last year.

## 2019-02-07 NOTE — Telephone Encounter (Signed)
Per Dr. Mcarthur Rossetti request, I am forwarding you this refill request. Please review and refill if appropriate

## 2019-02-12 ENCOUNTER — Other Ambulatory Visit: Payer: Self-pay | Admitting: Internal Medicine

## 2019-02-27 ENCOUNTER — Other Ambulatory Visit: Payer: Self-pay

## 2019-02-27 ENCOUNTER — Ambulatory Visit (INDEPENDENT_AMBULATORY_CARE_PROVIDER_SITE_OTHER): Payer: Medicare Other

## 2019-02-27 ENCOUNTER — Telehealth: Payer: Self-pay | Admitting: *Deleted

## 2019-02-27 DIAGNOSIS — Z23 Encounter for immunization: Secondary | ICD-10-CM | POA: Diagnosis not present

## 2019-02-27 NOTE — Telephone Encounter (Signed)
Patient called and left message on clinical intake requesting to speak with Dr. Mariea Clonts.   I return call and wife stated that patient was taking a nap and will call back later.

## 2019-03-01 ENCOUNTER — Encounter: Payer: Self-pay | Admitting: Internal Medicine

## 2019-03-01 NOTE — Telephone Encounter (Signed)
Patient stated he wants to speak with Dr. Mariea Clonts over the phone regarding some concerns.  1. Bone Density- Wife comes home with her problems and she is having a bone density. How does one know that one needs a bone density done. She asks him why he doesn't get one.   2. She's  taking Ferrous Sulfate in the morning but he is taking it before he goes to bed. He is under the impression that he was told to take before bed.   Please Advise.

## 2019-03-01 NOTE — Telephone Encounter (Signed)
1. Bone density:  We can discuss at his appointment.  I don't think he's particularly high risk.  Yes, older men can get osteoporosis, but it's much more common in women. 2.  Iron timing:  He should keep taking it at night.  That's working fine for him.

## 2019-03-02 NOTE — Telephone Encounter (Signed)
Patient was not at home. Wife took message and agreed.

## 2019-03-13 DIAGNOSIS — Z85828 Personal history of other malignant neoplasm of skin: Secondary | ICD-10-CM | POA: Diagnosis not present

## 2019-03-13 DIAGNOSIS — C4441 Basal cell carcinoma of skin of scalp and neck: Secondary | ICD-10-CM | POA: Diagnosis not present

## 2019-03-13 DIAGNOSIS — E039 Hypothyroidism, unspecified: Secondary | ICD-10-CM | POA: Diagnosis not present

## 2019-03-13 DIAGNOSIS — D2262 Melanocytic nevi of left upper limb, including shoulder: Secondary | ICD-10-CM | POA: Diagnosis not present

## 2019-03-13 DIAGNOSIS — L720 Epidermal cyst: Secondary | ICD-10-CM | POA: Diagnosis not present

## 2019-03-13 DIAGNOSIS — L570 Actinic keratosis: Secondary | ICD-10-CM | POA: Diagnosis not present

## 2019-03-13 DIAGNOSIS — D485 Neoplasm of uncertain behavior of skin: Secondary | ICD-10-CM | POA: Diagnosis not present

## 2019-03-13 DIAGNOSIS — C44319 Basal cell carcinoma of skin of other parts of face: Secondary | ICD-10-CM | POA: Diagnosis not present

## 2019-03-13 LAB — TSH: TSH: 4.42 (ref 0.41–5.90)

## 2019-03-15 ENCOUNTER — Telehealth: Payer: Self-pay

## 2019-03-15 ENCOUNTER — Encounter: Payer: Self-pay | Admitting: Internal Medicine

## 2019-03-15 NOTE — Telephone Encounter (Signed)
Per Dr.Reed Slight improvement in Thyroid (4.42), expect will continue to improve. Keep labs as scheduled before routine appointment.   Spoke with patient and his wife regarding the above. Both verbalized understanding of results. Mrs.Giacobbe wrote down date of pending appointment with Dr.Reed and is aware the independent nurse at [facility] will consult with them the week prior about getting labs at [facility].   TSH report sent for scanning and a copy mailed to patient per request

## 2019-03-19 ENCOUNTER — Telehealth: Payer: Self-pay | Admitting: *Deleted

## 2019-03-19 DIAGNOSIS — F320 Major depressive disorder, single episode, mild: Secondary | ICD-10-CM

## 2019-03-19 MED ORDER — TRAZODONE HCL 100 MG PO TABS: 100.0000 mg | ORAL_TABLET | Freq: Every day | ORAL | 1 refills | Status: DC

## 2019-03-19 NOTE — Telephone Encounter (Signed)
Rx sent to [facility]

## 2019-03-19 NOTE — Telephone Encounter (Signed)
90 days of trazodone is fine with 3 refills.

## 2019-03-19 NOTE — Telephone Encounter (Signed)
Patient called requesting refill on his Trazodone. Patient wants to know if he can get a 2-3 month supply instead of just 1 month supply so he doesn't have to go to the pharmacy as often. Please Advise.

## 2019-03-27 DIAGNOSIS — H43813 Vitreous degeneration, bilateral: Secondary | ICD-10-CM | POA: Diagnosis not present

## 2019-03-27 DIAGNOSIS — D3132 Benign neoplasm of left choroid: Secondary | ICD-10-CM | POA: Diagnosis not present

## 2019-03-27 DIAGNOSIS — Z961 Presence of intraocular lens: Secondary | ICD-10-CM | POA: Diagnosis not present

## 2019-03-27 DIAGNOSIS — H35373 Puckering of macula, bilateral: Secondary | ICD-10-CM | POA: Diagnosis not present

## 2019-03-27 DIAGNOSIS — E113293 Type 2 diabetes mellitus with mild nonproliferative diabetic retinopathy without macular edema, bilateral: Secondary | ICD-10-CM | POA: Diagnosis not present

## 2019-03-27 LAB — HM DIABETES EYE EXAM

## 2019-04-03 ENCOUNTER — Encounter: Payer: Self-pay | Admitting: *Deleted

## 2019-04-03 DIAGNOSIS — C4401 Basal cell carcinoma of skin of lip: Secondary | ICD-10-CM | POA: Diagnosis not present

## 2019-04-03 DIAGNOSIS — C4441 Basal cell carcinoma of skin of scalp and neck: Secondary | ICD-10-CM | POA: Diagnosis not present

## 2019-04-03 DIAGNOSIS — Z85828 Personal history of other malignant neoplasm of skin: Secondary | ICD-10-CM | POA: Diagnosis not present

## 2019-04-11 ENCOUNTER — Other Ambulatory Visit: Payer: Self-pay

## 2019-04-11 MED ORDER — ROSUVASTATIN CALCIUM 40 MG PO TABS: ORAL_TABLET | ORAL | 1 refills | Status: DC

## 2019-04-11 MED ORDER — LEVOTHYROXINE SODIUM 25 MCG PO TABS: ORAL_TABLET | ORAL | 0 refills | Status: DC

## 2019-04-17 DIAGNOSIS — Z85828 Personal history of other malignant neoplasm of skin: Secondary | ICD-10-CM | POA: Diagnosis not present

## 2019-04-17 DIAGNOSIS — D485 Neoplasm of uncertain behavior of skin: Secondary | ICD-10-CM | POA: Diagnosis not present

## 2019-04-17 DIAGNOSIS — L570 Actinic keratosis: Secondary | ICD-10-CM | POA: Diagnosis not present

## 2019-04-17 DIAGNOSIS — C44729 Squamous cell carcinoma of skin of left lower limb, including hip: Secondary | ICD-10-CM | POA: Diagnosis not present

## 2019-04-19 DIAGNOSIS — H524 Presbyopia: Secondary | ICD-10-CM | POA: Diagnosis not present

## 2019-04-19 DIAGNOSIS — H43813 Vitreous degeneration, bilateral: Secondary | ICD-10-CM | POA: Diagnosis not present

## 2019-04-19 DIAGNOSIS — H5213 Myopia, bilateral: Secondary | ICD-10-CM | POA: Diagnosis not present

## 2019-04-19 DIAGNOSIS — Z961 Presence of intraocular lens: Secondary | ICD-10-CM | POA: Diagnosis not present

## 2019-04-26 ENCOUNTER — Telehealth: Payer: Self-pay | Admitting: *Deleted

## 2019-04-26 NOTE — Telephone Encounter (Signed)
I spoke with Mliss Sax and gave her verbal authorization for the PT.

## 2019-04-26 NOTE — Telephone Encounter (Signed)
Bernadette with [facility] called requesting a verbal order for patient for PT to Evaluate and Treat due to walking issues and falls.  Please Advise.

## 2019-05-07 DIAGNOSIS — R2689 Other abnormalities of gait and mobility: Secondary | ICD-10-CM | POA: Diagnosis not present

## 2019-05-07 DIAGNOSIS — G3184 Mild cognitive impairment, so stated: Secondary | ICD-10-CM | POA: Diagnosis not present

## 2019-05-07 DIAGNOSIS — W1849XS Other slipping, tripping and stumbling without falling, sequela: Secondary | ICD-10-CM | POA: Diagnosis not present

## 2019-05-07 DIAGNOSIS — E663 Overweight: Secondary | ICD-10-CM | POA: Diagnosis not present

## 2019-05-07 DIAGNOSIS — R293 Abnormal posture: Secondary | ICD-10-CM | POA: Diagnosis not present

## 2019-05-07 DIAGNOSIS — R278 Other lack of coordination: Secondary | ICD-10-CM | POA: Diagnosis not present

## 2019-05-10 DIAGNOSIS — E663 Overweight: Secondary | ICD-10-CM | POA: Diagnosis not present

## 2019-05-10 DIAGNOSIS — R2689 Other abnormalities of gait and mobility: Secondary | ICD-10-CM | POA: Diagnosis not present

## 2019-05-10 DIAGNOSIS — G3184 Mild cognitive impairment, so stated: Secondary | ICD-10-CM | POA: Diagnosis not present

## 2019-05-10 DIAGNOSIS — R278 Other lack of coordination: Secondary | ICD-10-CM | POA: Diagnosis not present

## 2019-05-10 DIAGNOSIS — R293 Abnormal posture: Secondary | ICD-10-CM | POA: Diagnosis not present

## 2019-05-10 DIAGNOSIS — W1849XS Other slipping, tripping and stumbling without falling, sequela: Secondary | ICD-10-CM | POA: Diagnosis not present

## 2019-05-14 DIAGNOSIS — W1849XS Other slipping, tripping and stumbling without falling, sequela: Secondary | ICD-10-CM | POA: Diagnosis not present

## 2019-05-14 DIAGNOSIS — E663 Overweight: Secondary | ICD-10-CM | POA: Diagnosis not present

## 2019-05-14 DIAGNOSIS — R293 Abnormal posture: Secondary | ICD-10-CM | POA: Diagnosis not present

## 2019-05-14 DIAGNOSIS — R278 Other lack of coordination: Secondary | ICD-10-CM | POA: Diagnosis not present

## 2019-05-14 DIAGNOSIS — R2689 Other abnormalities of gait and mobility: Secondary | ICD-10-CM | POA: Diagnosis not present

## 2019-05-14 DIAGNOSIS — G3184 Mild cognitive impairment, so stated: Secondary | ICD-10-CM | POA: Diagnosis not present

## 2019-05-16 ENCOUNTER — Non-Acute Institutional Stay: Payer: Medicare Other | Admitting: Internal Medicine

## 2019-05-16 ENCOUNTER — Other Ambulatory Visit: Payer: Self-pay

## 2019-05-16 ENCOUNTER — Encounter: Payer: Self-pay | Admitting: Internal Medicine

## 2019-05-16 VITALS — BP 114/70 | HR 59 | Temp 97.5°F | Ht 71.0 in | Wt 197.0 lb

## 2019-05-16 DIAGNOSIS — F320 Major depressive disorder, single episode, mild: Secondary | ICD-10-CM

## 2019-05-16 DIAGNOSIS — G3184 Mild cognitive impairment, so stated: Secondary | ICD-10-CM

## 2019-05-16 DIAGNOSIS — Z639 Problem related to primary support group, unspecified: Secondary | ICD-10-CM

## 2019-05-16 DIAGNOSIS — K5900 Constipation, unspecified: Secondary | ICD-10-CM | POA: Diagnosis not present

## 2019-05-16 DIAGNOSIS — R2681 Unsteadiness on feet: Secondary | ICD-10-CM | POA: Diagnosis not present

## 2019-05-16 DIAGNOSIS — E113293 Type 2 diabetes mellitus with mild nonproliferative diabetic retinopathy without macular edema, bilateral: Secondary | ICD-10-CM | POA: Diagnosis not present

## 2019-05-16 DIAGNOSIS — Z794 Long term (current) use of insulin: Secondary | ICD-10-CM | POA: Diagnosis not present

## 2019-05-16 NOTE — Progress Notes (Signed)
Location:  [place] of Service:  Clinic (12)  Provider: Raiven Belizaire L. Mariea Clonts, D.O., C.M.D.  Code Status: DNR Goals of Care:  Advanced Directives 05/16/2019  Does Patient Have a Medical Advance Directive? Yes  Type of Advance Directive Living will  Does patient want to make changes to medical advance directive? No - Patient declined  Copy of [place] in Chart? -  Would patient like information on creating a medical advance directive? -     Chief Complaint  Patient presents with  . Acute Visit    Memory and behavior concerns. Moderate fall risk    HPI: Patient is a 85 y.o. male seen today for an acute visit for memory and behavioral concerns from his wife.  His wife reports he is turning 85 years old.  Her daughter would like to know what drug he's getting for his depression trazodone 100mg  po qhs and wellbutrin 100mg  po bid.  She reports he gets frustrated with things like his hearing not working.    He reports that through his own foolishness, he ruined his hearing aids.  He was struggling while waiting for the new hearing aids.  He now has new ones that function well.  He is now in good shape with those.  He says he's 99%.    She reports he's not the same--so easily frustrated about little things.  They're confined.  She says his personality has changed.    She c/o his procrastination and then frustration. Ex--sneakers are in the trunk and he's not wearing the proper shoes to walk outside.  Other shoes are used in the gym b/c they have different soles.    He's working with Rob from [employer].  Goes to gym twice a week and walks about 5 days a week for 30 mins.  Appetite good.  Resting well at night.  No pain.   Past Medical History:  Diagnosis Date  . Anxiety   . BRADYCARDIA 11/13/2008  . Cataract   . Cholelithiasis   . COLONIC POLYPS, HX OF 02/04/2007  . DEPRESSION 02/04/2007  . DIABETES MELLITUS, TYPE II 02/04/2007  . DIVERTICULOSIS, COLON 02/04/2007  .  FOREIGN BODY, ASPIRATION 12/04/2007  . GERD (gastroesophageal reflux disease)   . GLAUCOMA 03/08/2009  . Hepatitis A 1963  . HYPERLIPIDEMIA 12/04/2007  . HYPERTENSION 02/04/2007  . Memory loss   . OBSTRUCTIVE SLEEP APNEA 11/05/2009   -PSG 01/05/10 RDI 11, PLMI 56  . OSTEOARTHRITIS 02/04/2007  . Peptic stricture of esophagus   . PROSTATE CANCER 1997  . RENAL INSUFFICIENCY 11/13/2008  . SYNCOPE 12/31/2009  . THROMBOCYTOPENIA 04/17/2008  . THYROID NODULE, RIGHT 11/13/2008  . UNSPECIFIED ANEMIA 11/07/2007    Past Surgical History:  Procedure Laterality Date  . Heart Cartherization  2000  . PROSTATECTOMY  1997  . TONSILLECTOMY  1940's    Allergies  Allergen Reactions  . Lactose Intolerance (Gi)   . Sulfonamide Derivatives     REACTION: Itch/Red Spots    Outpatient Encounter Medications as of 05/16/2019  Medication Sig  . buPROPion (WELLBUTRIN) 100 MG tablet Take 1 tablet (100 mg total) by mouth 2 (two) times daily.  . Cholecalciferol (VITAMIN D) 2000 UNITS CAPS Take 1 capsule by mouth daily.    . ferrous sulfate 325 (65 FE) MG EC tablet Take 325 mg by mouth daily.  Marland Kitchen levothyroxine (SYNTHROID) 25 MCG tablet TAKE ONE TABLET BY MOUTH IN THE MORNING BEFORE BREAKFAST  . lisinopril (ZESTRIL) 5 MG tablet TAKE  ONE TABLET BY MOUTH ONE TIME DAILY   . rosuvastatin (CRESTOR) 40 MG tablet TAKE ONE TABLET BY MOUTH ONE TIME DAILY  . traZODone (DESYREL) 100 MG tablet Take 1 tablet (100 mg total) by mouth at bedtime.   No facility-administered encounter medications on file as of 05/16/2019.     Review of Systems:  Review of Systems  Constitutional: Negative for chills, fever and malaise/fatigue.  HENT: Negative for congestion.   Eyes: Negative for blurred vision.  Respiratory: Negative for cough and shortness of breath.   Cardiovascular: Negative for chest pain, palpitations and leg swelling.  Gastrointestinal: Negative for abdominal pain, constipation, diarrhea, nausea and vomiting.  Genitourinary:  Negative for dysuria.  Musculoskeletal: Positive for back pain and falls. Negative for joint pain.  Skin: Negative for itching and rash.  Neurological: Negative for dizziness, loss of consciousness and weakness.  Psychiatric/Behavioral: Positive for memory loss. Negative for depression. The patient is nervous/anxious. The patient does not have insomnia.     Health Maintenance  Topic Date Due  . FOOT EXAM  05/25/2019  . HEMOGLOBIN A1C  07/14/2019  . OPHTHALMOLOGY EXAM  03/26/2020  . TETANUS/TDAP  12/03/2026  . INFLUENZA VACCINE  Completed  . PNA vac Low Risk Adult  Completed    Physical Exam: Vitals:   05/16/19 1107  BP: 114/70  Pulse: (!) 59  Temp: (!) 97.5 F (36.4 C)  TempSrc: Temporal  SpO2: 97%  Weight: 197 lb (89.4 kg)  Height: 5\' 11"  (1.803 m)   Body mass index is 27.48 kg/m. Physical Exam Vitals signs and nursing note reviewed.  Constitutional:      General: He is not in acute distress.    Appearance: Normal appearance. He is normal weight. He is not toxic-appearing.  HENT:     Head: Normocephalic and atraumatic.  Eyes:     Comments: glasses  Cardiovascular:     Rate and Rhythm: Normal rate and regular rhythm.     Pulses: Normal pulses.     Heart sounds: Normal heart sounds.  Pulmonary:     Effort: Pulmonary effort is normal.     Breath sounds: Normal breath sounds. No wheezing, rhonchi or rales.  Abdominal:     General: Bowel sounds are normal.     Palpations: Abdomen is soft.  Musculoskeletal: Normal range of motion.  Skin:    General: Skin is warm and dry.  Neurological:     General: No focal deficit present.     Mental Status: He is alert and oriented to person, place, and time. Mental status is at baseline.     Motor: No weakness.     Gait: Gait normal.  Psychiatric:        Mood and Affect: Mood normal.        Behavior: Behavior normal.     Labs reviewed: Basic Metabolic Panel: Recent Labs    01/11/19 01/11/19 0200 03/13/19  NA 141  --    --   K 4.8  --   --   BUN 39*  --   --   CREATININE 1.8*  --   --   TSH  --  5.42 4.42   Liver Function Tests: Recent Labs    01/11/19 0200  AST 19  ALT 15   No results for input(s): LIPASE, AMYLASE in the last 8760 hours. No results for input(s): AMMONIA in the last 8760 hours. CBC: Recent Labs    01/11/19 01/22/19 1555  WBC 8.6 11.7*  NEUTROABS  --  6,494  HGB 12.8* 12.0*  HCT 37* 35.4*  MCV  --  94.7  PLT 112* 148   Lipid Panel: Recent Labs    01/11/19  CHOL 137  HDL 36  LDLCALC 76  TRIG 121   Lab Results  Component Value Date   HGBA1C 6.4 01/11/2019   Assessment/Plan 1. MCI (mild cognitive impairment) with memory loss - MMSE - Mini Mental State Exam 03/07/2018  Orientation to time 5  Orientation to Place 5  Registration 3  Attention/ Calculation 5  Recall 2  Language- name 2 objects 2  Language- repeat 1  Language- follow 3 step command 3  Language- read & follow direction 1  Write a sentence 1  Copy design 1  Total score 29  -no remarkable difference vs before  2. Depression, major, single episode, mild (HCC) -tolerating the wellbutrin and trazodone well though not with notable difference he can see -has never seemed outright depressed and seems a lot of this is due to relationship dysfunction  3. 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 Relationship dysfunction -remains the biggest issue--again recommended that he and his wife seek counseling  5. Constipation, unspecified constipation type -this is better   6. Unsteady gait -requires proper shoewear to prevent  -2 falls since last seen per wife -was previously referred for PT  Labs/tests ordered:  No new Next appt:  07/18/2019  Shyra Emile L. Eliazer Hemphill, D.O. [facility] [address], [place] [address] Cell Phone (Mon-Fri 8am-5pm):  [phone] On Call:  [phone] & follow prompts after 5pm & weekends Office Phone:  [phone] Office Fax:  [phone]

## 2019-05-17 DIAGNOSIS — E663 Overweight: Secondary | ICD-10-CM | POA: Diagnosis not present

## 2019-05-17 DIAGNOSIS — W1849XS Other slipping, tripping and stumbling without falling, sequela: Secondary | ICD-10-CM | POA: Diagnosis not present

## 2019-05-17 DIAGNOSIS — R2689 Other abnormalities of gait and mobility: Secondary | ICD-10-CM | POA: Diagnosis not present

## 2019-05-17 DIAGNOSIS — R278 Other lack of coordination: Secondary | ICD-10-CM | POA: Diagnosis not present

## 2019-05-17 DIAGNOSIS — R293 Abnormal posture: Secondary | ICD-10-CM | POA: Diagnosis not present

## 2019-05-17 DIAGNOSIS — G3184 Mild cognitive impairment, so stated: Secondary | ICD-10-CM | POA: Diagnosis not present

## 2019-05-22 DIAGNOSIS — W1849XS Other slipping, tripping and stumbling without falling, sequela: Secondary | ICD-10-CM | POA: Diagnosis not present

## 2019-05-22 DIAGNOSIS — G3184 Mild cognitive impairment, so stated: Secondary | ICD-10-CM | POA: Diagnosis not present

## 2019-05-22 DIAGNOSIS — R2689 Other abnormalities of gait and mobility: Secondary | ICD-10-CM | POA: Diagnosis not present

## 2019-05-22 DIAGNOSIS — R293 Abnormal posture: Secondary | ICD-10-CM | POA: Diagnosis not present

## 2019-05-22 DIAGNOSIS — R278 Other lack of coordination: Secondary | ICD-10-CM | POA: Diagnosis not present

## 2019-05-22 DIAGNOSIS — E663 Overweight: Secondary | ICD-10-CM | POA: Diagnosis not present

## 2019-05-30 DIAGNOSIS — W1849XS Other slipping, tripping and stumbling without falling, sequela: Secondary | ICD-10-CM | POA: Diagnosis not present

## 2019-05-30 DIAGNOSIS — R278 Other lack of coordination: Secondary | ICD-10-CM | POA: Diagnosis not present

## 2019-05-30 DIAGNOSIS — R293 Abnormal posture: Secondary | ICD-10-CM | POA: Diagnosis not present

## 2019-05-30 DIAGNOSIS — R2689 Other abnormalities of gait and mobility: Secondary | ICD-10-CM | POA: Diagnosis not present

## 2019-05-30 DIAGNOSIS — G3184 Mild cognitive impairment, so stated: Secondary | ICD-10-CM | POA: Diagnosis not present

## 2019-05-30 DIAGNOSIS — E663 Overweight: Secondary | ICD-10-CM | POA: Diagnosis not present

## 2019-06-08 ENCOUNTER — Other Ambulatory Visit: Payer: Self-pay

## 2019-06-08 ENCOUNTER — Ambulatory Visit (INDEPENDENT_AMBULATORY_CARE_PROVIDER_SITE_OTHER): Payer: Medicare Other | Admitting: Cardiovascular Disease

## 2019-06-08 ENCOUNTER — Encounter: Payer: Self-pay | Admitting: Cardiovascular Disease

## 2019-06-08 VITALS — BP 140/60 | HR 59 | Ht 71.0 in | Wt 198.4 lb

## 2019-06-08 DIAGNOSIS — E782 Mixed hyperlipidemia: Secondary | ICD-10-CM

## 2019-06-08 DIAGNOSIS — I10 Essential (primary) hypertension: Secondary | ICD-10-CM | POA: Diagnosis not present

## 2019-06-08 NOTE — Progress Notes (Signed)
Cardiology Office Note:    Date:  06/08/2019   ID:  [name], DOB Oct 09, 1927, MRN [id number]  PCP:  Gayland Curry, DO  Cardiologist:  Sherren Mocha, MD  Electrophysiologist:  None   Referring MD: Gayland Curry, DO   Chief Complaint  Patient presents with  . Hypertension  . Hyperlipidemia    History of Present Illness:    [name] is a 85 y.o. male with a hx of hypertension and mixed hyperlipidemia, presenting for follow-up evaluation.  The patient is doing well from a symptomatic perspective.  He is here alone today.  He is walking 30 minutes a day at a leisurely pace with no exertional symptoms.  He has not had any lightheadedness, heart palpitations, syncope, shortness of breath, or chest pain.  His medications are unchanged.  Past Medical History:  Diagnosis Date  . Anxiety   . BRADYCARDIA 11/13/2008  . Cataract   . Cholelithiasis   . COLONIC POLYPS, HX OF 02/04/2007  . DEPRESSION 02/04/2007  . DIABETES MELLITUS, TYPE II 02/04/2007  . DIVERTICULOSIS, COLON 02/04/2007  . FOREIGN BODY, ASPIRATION 12/04/2007  . GERD (gastroesophageal reflux disease)   . GLAUCOMA 03/08/2009  . Hepatitis A 1963  . HYPERLIPIDEMIA 12/04/2007  . HYPERTENSION 02/04/2007  . Memory loss   . OBSTRUCTIVE SLEEP APNEA 11/05/2009   -PSG 01/05/10 RDI 11, PLMI 56  . OSTEOARTHRITIS 02/04/2007  . Peptic stricture of esophagus   . PROSTATE CANCER 1997  . RENAL INSUFFICIENCY 11/13/2008  . SYNCOPE 12/31/2009  . THROMBOCYTOPENIA 04/17/2008  . THYROID NODULE, RIGHT 11/13/2008  . UNSPECIFIED ANEMIA 11/07/2007    Past Surgical History:  Procedure Laterality Date  . Heart Cartherization  2000  . PROSTATECTOMY  1997  . TONSILLECTOMY  1940's    Current Medications: Current Meds  Medication Sig  . buPROPion (WELLBUTRIN) 100 MG tablet Take 1 tablet (100 mg total) by mouth 2 (two) times daily.  . Cholecalciferol (VITAMIN D) 2000 UNITS CAPS Take 1 capsule by mouth daily.    . ferrous sulfate 325 (65 FE) MG  EC tablet Take 325 mg by mouth daily.  Marland Kitchen levothyroxine (SYNTHROID) 25 MCG tablet TAKE ONE TABLET BY MOUTH IN THE MORNING BEFORE BREAKFAST  . lisinopril (ZESTRIL) 5 MG tablet TAKE ONE TABLET BY MOUTH ONE TIME DAILY   . rosuvastatin (CRESTOR) 40 MG tablet TAKE ONE TABLET BY MOUTH ONE TIME DAILY  . traZODone (DESYREL) 100 MG tablet Take 1 tablet (100 mg total) by mouth at bedtime.     Allergies:   Lactose intolerance (gi) and Sulfonamide derivatives   Social History   Socioeconomic History  . Marital status: Married    Spouse name: Not on file  . Number of children: 3  . Years of education: college  . Highest education level: Bachelor's degree (e.g., BA, AB, BS)  Occupational History  . Occupation: Immunologist    Comment: Retired  Tobacco Use  . Smoking status: Former Smoker    Types: Cigarettes, Pipe    Quit date: 07/08/1993    Years since quitting: 25.9  . Smokeless tobacco: Never Used  Substance and Sexual Activity  . Alcohol use: Yes    Comment: minimal  . Drug use: No  . Sexual activity: Not on file  Other Topics Concern  . Not on file  Social History Narrative   He played tennis 3x a week before moving to [place].   Right-handed.   2 cups caffeine per day.  Social Determinants of Health   Financial Resource Strain:   . Difficulty of Paying Living Expenses: Not on file  Food Insecurity:   . Worried About Charity fundraiser in the Last Year: Not on file  . Ran Out of Food in the Last Year: Not on file  Transportation Needs:   . Lack of Transportation (Medical): Not on file  . Lack of Transportation (Non-Medical): Not on file  Physical Activity:   . Days of Exercise per Week: Not on file  . Minutes of Exercise per Session: Not on file  Stress:   . Feeling of Stress : Not on file  Social Connections:   . Frequency of Communication with Friends and Family: Not on file  . Frequency of Social Gatherings with Friends and Family:  Not on file  . Attends Religious Services: Not on file  . Active Member of Clubs or Organizations: Not on file  . Attends Archivist Meetings: Not on file  . Marital Status: Not on file     Family History: The patient's family history includes Kidney disease in his father; Other in his mother. There is no history of Heart disease, Cancer, Colon cancer, Esophageal cancer, Inflammatory bowel disease, Liver disease, Pancreatic cancer, Rectal cancer, or Stomach cancer.  ROS:   Please see the history of present illness.    All other systems reviewed and are negative.  EKGs/Labs/Other Studies Reviewed:    The following studies were reviewed today: Echo 05-01-2018: Study Conclusions  - Left ventricle: The cavity size was normal. Systolic function was   normal. The estimated ejection fraction was in the range of 55%   to 60%. Wall motion was normal; there were no regional wall   motion abnormalities. There was an increased relative   contribution of atrial contraction to ventricular filling.   Doppler parameters are consistent with abnormal left ventricular   relaxation (grade 1 diastolic dysfunction). Indeterminate mean   left atrial filling pressure. - Aortic valve: There was mild regurgitation. - Left atrium: The atrium was mildly dilated. - Right ventricle: The cavity size was mildly dilated. Wall   thickness was normal. - Right atrium: The atrium was mildly dilated.  EKG:  EKG is ordered today.  The ekg ordered today demonstrates NSR, LAD, HR 59 bpm  Recent Labs: 01/11/2019: ALT 15; BUN 39; Creatinine 1.8; Potassium 4.8; Sodium 141 01/22/2019: Hemoglobin 12.0; Platelets 148 03/13/2019: TSH 4.42  Recent Lipid Panel    Component Value Date/Time   CHOL 137 01/11/2019 0000   TRIG 121 01/11/2019 0000   TRIG 48 04/05/2006 0750   HDL 36 01/11/2019 0000   CHOLHDL 4 11/22/2017 1003   VLDL 32.6 11/22/2017 1003   LDLCALC 76 01/11/2019 0000    Physical Exam:    VS:  BP  140/60   Pulse (!) 59   Ht 5\' 11"  (1.803 m)   Wt 198 lb 6.4 oz (90 kg)   SpO2 98%   BMI 27.67 kg/m     Wt Readings from Last 3 Encounters:  06/08/19 198 lb 6.4 oz (90 kg)  05/16/19 197 lb (89.4 kg)  01/22/19 199 lb (90.3 kg)     GEN:  Well nourished, well developed elderly male in no acute distress HEENT: Normal NECK: No JVD; No carotid bruits LYMPHATICS: No lymphadenopathy CARDIAC: RRR, no murmurs, rubs, gallops RESPIRATORY:  Clear to auscultation without rales, wheezing or rhonchi  ABDOMEN: Soft, non-tender, non-distended MUSCULOSKELETAL:  No edema; No deformity  SKIN: Warm and  dry NEUROLOGIC:  Alert and oriented x 3 PSYCHIATRIC:  Normal affect   ASSESSMENT:    1. Essential hypertension   2. Mixed hyperlipidemia    PLAN:    In order of problems listed above:  1. Blood pressure is well controlled considering his advanced age.  He will continue on lisinopril 5 mg daily. 2. The patient is treated with a high intensity statin drug, on rosuvastatin 40 mg daily.  Labs from July 2020 show a cholesterol 137, HDL 36, LDL 76.  LFTs have been normal.  Overall I think the patient is doing very well.  His most recent labs are reviewed today and these demonstrate lipid findings as outlined above.  Kidney and liver function testing is reviewed.  He does have chronic kidney disease with a creatinine of 1.8 mg/dL on most recent labs.  He is tolerating a low-dose of lisinopril.  I will plan to see him back in 1 year for follow-up evaluation.   Medication Adjustments/Labs and Tests Ordered: Current medicines are reviewed at length with the patient today.  Concerns regarding medicines are outlined above.  Orders Placed This Encounter  Procedures  . EKG 12-Lead   No orders of the defined types were placed in this encounter.   Patient Instructions  Medication Instructions:  Your provider recommends that you continue on your current medications as directed. Please refer to the Current  Medication list given to you today.   *If you need a refill on your cardiac medications before your next appointment, please call your pharmacy*   Follow-Up: At [facility], you and your health needs are our priority.  As part of our continuing mission to provide you with exceptional heart care, we have created designated Provider Care Teams.  These Care Teams include your primary Cardiologist (physician) and Advanced Practice Providers (APPs -  Physician Assistants and Nurse Practitioners) who all work together to provide you with the care you need, when you need it. Your next appointment:   12 month(s) The format for your next appointment:   In Person Provider:   You may see Sherren Mocha, MD or one of the following Advanced Practice Providers on your designated Care Team:    Richardson Dopp, PA-C  Vin [place], PA-C  Daune Perch, [place]    Signed, Sherren Mocha, MD  06/08/2019 10:33 AM    [place]

## 2019-06-08 NOTE — Patient Instructions (Signed)
Medication Instructions:  Your provider recommends that you continue on your current medications as directed. Please refer to the Current Medication list given to you today.   *If you need a refill on your cardiac medications before your next appointment, please call your pharmacy*   Follow-Up: At [facility], you and your health needs are our priority.  As part of our continuing mission to provide you with exceptional heart care, we have created designated Provider Care Teams.  These Care Teams include your primary Cardiologist (physician) and Advanced Practice Providers (APPs -  Physician Assistants and Nurse Practitioners) who all work together to provide you with the care you need, when you need it. Your next appointment:   12 month(s) The format for your next appointment:   In Person Provider:   You may see Sherren Mocha, MD or one of the following Advanced Practice Providers on your designated Care Team:    Richardson Dopp, PA-C  Vin Conway, [place]  Daune Perch, [place]

## 2019-07-11 DIAGNOSIS — Z23 Encounter for immunization: Secondary | ICD-10-CM | POA: Diagnosis not present

## 2019-07-18 ENCOUNTER — Encounter: Payer: Self-pay | Admitting: Internal Medicine

## 2019-07-19 ENCOUNTER — Telehealth: Payer: Self-pay | Admitting: Internal Medicine

## 2019-07-19 ENCOUNTER — Encounter: Payer: Self-pay | Admitting: Adult Health

## 2019-07-19 ENCOUNTER — Non-Acute Institutional Stay (SKILLED_NURSING_FACILITY): Payer: Medicare Other | Admitting: Adult Health

## 2019-07-19 DIAGNOSIS — N184 Chronic kidney disease, stage 4 (severe): Secondary | ICD-10-CM

## 2019-07-19 DIAGNOSIS — E118 Type 2 diabetes mellitus with unspecified complications: Secondary | ICD-10-CM

## 2019-07-19 DIAGNOSIS — G3184 Mild cognitive impairment, so stated: Secondary | ICD-10-CM

## 2019-07-19 DIAGNOSIS — U071 COVID-19: Secondary | ICD-10-CM

## 2019-07-19 DIAGNOSIS — R05 Cough: Secondary | ICD-10-CM | POA: Diagnosis not present

## 2019-07-19 DIAGNOSIS — R531 Weakness: Secondary | ICD-10-CM

## 2019-07-19 NOTE — Progress Notes (Addendum)
Location:  Occupational psychologist of Service:  SNF (31) Provider:   Cindi Carbon, ANP [place] [phone]  Gayland Curry, DO  Patient Care Team: Gayland Curry, DO as PCP - General (Geriatric Medicine) Sherren Mocha, MD as PCP - Cardiology (Cardiology) Nobie Putnam, MD (Hematology and Oncology) Martinique, Amy, MD as Consulting Physician (Dermatology)  Extended Emergency Contact Information Primary Emergency Contact: Skipper,Simone Address: [address]          [place], [place] [address] Johnnette Litter of [place] Phone: [phone] Mobile Phone: [phone] Relation: Spouse  Code Status: Full code Goals of care: Advanced Directive information Advanced Directives 05/16/2019  Does Patient Have a Medical Advance Directive? Yes  Type of Advance Directive Living will  Does patient want to make changes to medical advance directive? No - Patient declined  Copy of [place] in Chart? -  Would patient like information on creating a medical advance directive? -     Chief Complaint  Patient presents with   Acute Visit    covid     HPI:  Pt is a 86 y.o. male seen today for an acute visit for admission to the rehab unit due to covid positive test result with cough and weakness.  He reports that he tested positive on 1/16.  He has had a congested cough with no sputum production for 3-4 days. He reports fever but denies any sore throat, sob, lack of appetite, loss of taste or smell, sob, low oxygen, chest pain, etc.  He has a hx of MCI and resides with his wife. She reported some weakness and felt that he needed to be monitored in the rehab unit. He reports he had the Moderna vaccine 1 week ago.  Nsg notes from independent living show that he had a temp of 102 last evening and received tylenol. He had a respiratory rate of 30 this morning with sats 90-92% and weakness. Temp 100.4 and sats 95% on RA as of 3pm  07/19/2019   CXR 2 view 07/19/2019 showed no acute pulmonary findings.   His family is requesting the monoclonal antibody infusion.   Past Medical History:  Diagnosis Date   Anxiety    BRADYCARDIA 11/13/2008   Cataract    Cholelithiasis    COLONIC POLYPS, HX OF 02/04/2007   DEPRESSION 02/04/2007   DIABETES MELLITUS, TYPE II 02/04/2007   DIVERTICULOSIS, COLON 02/04/2007   FOREIGN BODY, ASPIRATION 12/04/2007   GERD (gastroesophageal reflux disease)    GLAUCOMA 03/08/2009   Hepatitis A 1963   HYPERLIPIDEMIA 12/04/2007   HYPERTENSION 02/04/2007   Memory loss    OBSTRUCTIVE SLEEP APNEA 11/05/2009   -PSG 01/05/10 RDI 11, PLMI 56   OSTEOARTHRITIS 02/04/2007   Peptic stricture of esophagus    PROSTATE CANCER 1997   RENAL INSUFFICIENCY 11/13/2008   SYNCOPE 12/31/2009   THROMBOCYTOPENIA 04/17/2008   THYROID NODULE, RIGHT 11/13/2008   UNSPECIFIED ANEMIA 11/07/2007   Past Surgical History:  Procedure Laterality Date   Heart Cartherization  2000   PROSTATECTOMY  1997   TONSILLECTOMY  1940's    Allergies  Allergen Reactions   Lactose Intolerance (Gi)    Sulfonamide Derivatives     REACTION: Itch/Red Spots    Outpatient Encounter Medications as of 07/19/2019  Medication Sig   buPROPion (WELLBUTRIN) 100 MG tablet Take 1 tablet (100 mg total) by mouth 2 (two) times daily.   Cholecalciferol (VITAMIN D) 2000 UNITS CAPS Take 1  capsule by mouth daily.     ferrous sulfate 325 (65 FE) MG EC tablet Take 325 mg by mouth daily.   levothyroxine (SYNTHROID) 25 MCG tablet TAKE ONE TABLET BY MOUTH IN THE MORNING BEFORE BREAKFAST   lisinopril (ZESTRIL) 5 MG tablet TAKE ONE TABLET BY MOUTH ONE TIME DAILY    rosuvastatin (CRESTOR) 40 MG tablet TAKE ONE TABLET BY MOUTH ONE TIME DAILY   traZODone (DESYREL) 100 MG tablet Take 1 tablet (100 mg total) by mouth at bedtime.   No facility-administered encounter medications on file as of 07/19/2019.    Review of Systems  Constitutional:  Positive for activity change and fever. Negative for appetite change, chills, diaphoresis, fatigue and unexpected weight change.  HENT: Positive for hearing loss. Negative for congestion, postnasal drip, rhinorrhea, sinus pressure, sore throat and trouble swallowing.   Respiratory: Positive for cough. Negative for shortness of breath, wheezing and stridor.   Cardiovascular: Negative for chest pain, palpitations and leg swelling.  Gastrointestinal: Negative for abdominal distention, abdominal pain, constipation and diarrhea.  Genitourinary: Negative for difficulty urinating and dysuria.  Musculoskeletal: Positive for gait problem. Negative for arthralgias, back pain, joint swelling and myalgias.  Skin: Negative for wound.  Neurological: Negative for dizziness, seizures, syncope, facial asymmetry, speech difficulty, weakness and headaches.  Hematological: Negative for adenopathy. Does not bruise/bleed easily.  Psychiatric/Behavioral: Positive for confusion. Negative for agitation and behavioral problems.    Immunization History  Administered Date(s) Administered   Fluad Quad(high Dose 65+) 02/27/2019   Influenza Split 04/05/2011, 04/04/2012   Influenza Whole 03/27/2008, 04/15/2009, 03/20/2010   Influenza, High Dose Seasonal PF 03/31/2016, 04/06/2017, 03/29/2018   Influenza,inj,Quad PF,6+ Mos 04/03/2013, 03/14/2015   Pneumococcal Conjugate-13 03/14/2015   Pneumococcal Polysaccharide-23 06/29/1995   Td 11/13/2004   Tdap 12/02/2016   Zoster 06/28/2004   Zoster Recombinat (Shingrix) 12/30/2017, 05/22/2018   Pertinent  Health Maintenance Due  Topic Date Due   FOOT EXAM  05/25/2019   HEMOGLOBIN A1C  07/14/2019   OPHTHALMOLOGY EXAM  03/26/2020   INFLUENZA VACCINE  Completed   PNA vac Low Risk Adult  Completed   Fall Risk  05/16/2019 05/16/2019 01/22/2019 01/10/2019 07/05/2018  Falls in the past year? - 1 0 0 0  Number falls in past yr: - 1 0 0 0  Injury with Fall? - 0 0 0 0    Risk for fall due to : Impaired balance/gait;Mental status change;Medication side effect History of fall(s) - - -  Follow up Falls evaluation completed;Education provided;Falls prevention discussed - - - -   Functional Status Survey:    Vitals:   07/19/19 1601  BP: (!) 156/60  Pulse: 73  Resp: 16  Temp: (!) 100.4 F (38 C)  SpO2: 95%   There is no height or weight on file to calculate BMI. Physical Exam Vitals and nursing note reviewed.  Constitutional:      General: He is not in acute distress.    Appearance: He is not diaphoretic.  HENT:     Head: Normocephalic and atraumatic.     Nose: Nose normal. No congestion.     Mouth/Throat:     Mouth: Mucous membranes are dry.     Pharynx: Oropharynx is clear. No oropharyngeal exudate.  Neck:     Thyroid: No thyromegaly.     Vascular: No JVD.     Trachea: No tracheal deviation.  Cardiovascular:     Rate and Rhythm: Normal rate and regular rhythm.     Heart sounds: No murmur.  Pulmonary:     Effort: Pulmonary effort is normal. No respiratory distress.     Breath sounds: Rales (left base) present. No wheezing.  Abdominal:     General: Bowel sounds are normal. There is no distension.     Palpations: Abdomen is soft.     Tenderness: There is no abdominal tenderness.  Musculoskeletal:     Cervical back: No rigidity or tenderness.     Right lower leg: No edema.     Left lower leg: No edema.  Lymphadenopathy:     Cervical: No cervical adenopathy.  Skin:    General: Skin is warm and dry.  Neurological:     General: No focal deficit present.     Mental Status: He is alert and oriented to person, place, and time. Mental status is at baseline.     Cranial Nerves: No cranial nerve deficit.  Psychiatric:        Mood and Affect: Mood normal.     Labs reviewed: Recent Labs    01/11/19 0000  NA 141  K 4.8  BUN 39*  CREATININE 1.8*   Recent Labs    01/11/19 0200  AST 19  ALT 15   Recent Labs    01/11/19 0000  01/22/19 1555  WBC 8.6 11.7*  NEUTROABS  --  6,494  HGB 12.8* 12.0*  HCT 37* 35.4*  MCV  --  94.7  PLT 112* 148   Lab Results  Component Value Date   TSH 4.42 03/13/2019   Lab Results  Component Value Date   HGBA1C 6.4 01/11/2019   Lab Results  Component Value Date   CHOL 137 01/11/2019   HDL 36 01/11/2019   LDLCALC 76 01/11/2019   TRIG 121 01/11/2019   CHOLHDL 4 11/22/2017    Significant Diagnostic Results in last 30 days:  No results found.  Assessment/Plan 1. COVID-19 virus infection Mild/Mod at this time with no sob or hypoxia. Recommend enhanced isolation for 20 days.  He meets criteria for monoclonal antibody therapy due to his age, CKD, and hx of DM II. I discussed this with his wife and Terrial Rhodes. She was informed that this is an infusion for investigational use and is not proven to be effective. There is a change of allergic reaction and side effects. She voiced understanding and agreed to the infusion.  Bamlanivimab 700 mg IV x 1 over 1 hr.  Check vitals at the beginning of the infusion and 15 min into the infusion. Monitor for allergic reaction. Pharmacy to provide benadryl and staff have access to epinephrine if needed.   2. Mild cognitive impairment Slight worse and repeating himself due to change in environment and acute infection.   3. Controlled type 2 diabetes mellitus with complication, without long-term current use of insulin (HCC) Diet controlled  4. Weakness Due to acute infection.  If not improvement may need physical therapy. Staff to get out of bed tid   5. Chronic kidney disease (CKD), stage IV (severe) (HCC) Continue to monitor Encourage hydration Avoid nephrotoxic agents.    Family/ staff Communication: as above Labs/tests ordered:  CXR done

## 2019-07-19 NOTE — Telephone Encounter (Signed)
Clinic nurse called at 8am this morning and advised that she was called to see the resident in his apt.  He was noted to have fever 102 overnight, he's been coughing.  When she assessed him this am, temp was still 100.2, POX 90-92% RA, she counted his RR as 30/min, but he did not appear grossly dyspneic.  He denied feeling sob and his wife said he does not admit to symptoms well.  He has mild cognitive impairment, but is fully functional and independent at baseline.  His BP and HR were wnl.  When we spoke, I recommended two-view CXR and transfer to rehab unit for monitoring and covid protocol.  She was checking with other nursing staff about admission.  Jenalyn Girdner L. Kaelum Kissick, D.O. [facility] [address], [place] [address] Cell Phone (Mon-Fri 8am-5pm):  [phone] On Call:  [phone] & follow prompts after 5pm & weekends Office Phone:  [phone] Office Fax:  [phone]

## 2019-07-20 ENCOUNTER — Telehealth: Payer: Self-pay | Admitting: Nurse Practitioner

## 2019-07-20 DIAGNOSIS — E860 Dehydration: Secondary | ICD-10-CM | POA: Diagnosis not present

## 2019-07-20 DIAGNOSIS — D649 Anemia, unspecified: Secondary | ICD-10-CM | POA: Diagnosis not present

## 2019-07-20 NOTE — Telephone Encounter (Signed)
Called and reported stat: wbc 9, Hgb 12.4, plt 83, Na 132, K 4.3,  Bun 37.9, Creat 2.13. Encourage oral fluids. Ordered NS 75cc/hr x1030ml, repeat BMP after IVF.

## 2019-07-21 DIAGNOSIS — D649 Anemia, unspecified: Secondary | ICD-10-CM | POA: Diagnosis not present

## 2019-07-21 DIAGNOSIS — E860 Dehydration: Secondary | ICD-10-CM | POA: Diagnosis not present

## 2019-07-21 LAB — CBC AND DIFFERENTIAL
HCT: 36 — AB (ref 41–53)
Hemoglobin: 12.4 — AB (ref 13.5–17.5)
Neutrophils Absolute: 4
Platelets: 87 — AB (ref 150–399)
WBC: 9

## 2019-07-21 LAB — CBC: RBC: 3.78 — AB (ref 3.87–5.11)

## 2019-07-21 LAB — COMPREHENSIVE METABOLIC PANEL: Calcium: 7.9 — AB (ref 8.7–10.7)

## 2019-07-22 ENCOUNTER — Telehealth: Payer: Self-pay | Admitting: Nurse Practitioner

## 2019-07-22 NOTE — Telephone Encounter (Signed)
Wife called while I was on call 07/21/19 stating she was very concerned over her husband who had COVID and in the rehab unit at [facility]. Assured her that the nursing staff was providing care for him and they would notify us for any concerns. She wanted a provider to take a look at him on Monday.  Told her I would let facility providers know.

## 2019-07-23 ENCOUNTER — Telehealth: Payer: Self-pay

## 2019-07-23 DIAGNOSIS — E860 Dehydration: Secondary | ICD-10-CM | POA: Diagnosis not present

## 2019-07-23 DIAGNOSIS — D649 Anemia, unspecified: Secondary | ICD-10-CM | POA: Diagnosis not present

## 2019-07-23 LAB — BASIC METABOLIC PANEL
BUN: 34 — AB (ref 4–21)
CO2: 21 (ref 13–22)
Chloride: 99 (ref 99–108)
Creatinine: 1.9 — AB (ref 0.6–1.3)
Glucose: 100
Potassium: 4.5 (ref 3.4–5.3)
Sodium: 132 — AB (ref 137–147)

## 2019-07-23 NOTE — Telephone Encounter (Signed)
Noted and Patient wife "[name]" notified. However wife states that after your visit with her husband "[name]" tomorrow. She want's you to speak to their  daughter "[name]" who is an "[employer] treasurer", and has her "Masters in Social Work" says wife. Wife states that she wants you to speak to daughter about concerns of his Blood Pressure, and Hydration Monitoring. Please Advise.

## 2019-07-23 NOTE — Telephone Encounter (Signed)
Patient wife "Nolton Denis" called and states that her husband "[name]" is in the [place] at [facility]. Patient wife states that she wants to know when you will be involved and accessing her husband in the process. She states that she would like to know when you're going to see him because you're his PCP and all he's been interacting with is Nurses. Please Advise.

## 2019-07-23 NOTE — Telephone Encounter (Signed)
I can see him tomorrow.  Our excellent NP has been providing his care on the days when I have been in the office.

## 2019-07-24 ENCOUNTER — Encounter: Payer: Self-pay | Admitting: Internal Medicine

## 2019-07-24 ENCOUNTER — Non-Acute Institutional Stay (SKILLED_NURSING_FACILITY): Payer: Medicare Other | Admitting: Internal Medicine

## 2019-07-24 DIAGNOSIS — F320 Major depressive disorder, single episode, mild: Secondary | ICD-10-CM | POA: Diagnosis not present

## 2019-07-24 DIAGNOSIS — G3184 Mild cognitive impairment, so stated: Secondary | ICD-10-CM | POA: Diagnosis not present

## 2019-07-24 DIAGNOSIS — E118 Type 2 diabetes mellitus with unspecified complications: Secondary | ICD-10-CM | POA: Diagnosis not present

## 2019-07-24 DIAGNOSIS — E039 Hypothyroidism, unspecified: Secondary | ICD-10-CM

## 2019-07-24 DIAGNOSIS — U071 COVID-19: Secondary | ICD-10-CM | POA: Diagnosis not present

## 2019-07-24 DIAGNOSIS — N183 Chronic kidney disease, stage 3 unspecified: Secondary | ICD-10-CM | POA: Insufficient documentation

## 2019-07-24 DIAGNOSIS — R410 Disorientation, unspecified: Secondary | ICD-10-CM | POA: Diagnosis not present

## 2019-07-24 DIAGNOSIS — R531 Weakness: Secondary | ICD-10-CM

## 2019-07-24 DIAGNOSIS — N179 Acute kidney failure, unspecified: Secondary | ICD-10-CM | POA: Diagnosis not present

## 2019-07-24 DIAGNOSIS — N184 Chronic kidney disease, stage 4 (severe): Secondary | ICD-10-CM | POA: Insufficient documentation

## 2019-07-24 DIAGNOSIS — N1832 Chronic kidney disease, stage 3b: Secondary | ICD-10-CM | POA: Diagnosis not present

## 2019-07-24 DIAGNOSIS — K5900 Constipation, unspecified: Secondary | ICD-10-CM

## 2019-07-24 NOTE — Progress Notes (Signed)
Provider:  Rexene Edison. Mariea Clonts, D.O., C.M.D. Location:  [facility] Room Number: Rehab  Transistion Place of Service:  Nursing (32)  PCP: Gayland Curry, DO Patient Care Team: Gayland Curry, DO as PCP - General (Geriatric Medicine) Sherren Mocha, MD as PCP - Cardiology (Cardiology) Nobie Putnam, MD (Hematology and Oncology) Martinique, Amy, MD as Consulting Physician (Dermatology)  Extended Emergency Contact Information Primary Emergency Contact: Cederberg,Simone Address: [address]          [place], [place] [address] Johnnette Litter of [place] Phone: [phone] Mobile Phone: [phone] Relation: Spouse  Code Status: FULL CODE Goals of Care: Advanced Directive information Advanced Directives 07/24/2019  Does Patient Have a Medical Advance Directive? Yes  Type of Advance Directive [place]  Does patient want to make changes to medical advance directive? No - Patient declined  Copy of [place] in Chart? -  Would patient like information on creating a medical advance directive? -      Chief Complaint  Patient presents with  . New Admit To SNF    Rehab due to covid 19 positive and high BP     HPI: Patient is a 86 y.o. male seen today for admission to [facility] for fever, lethargy, poor intake,diarrhea amid covid-19 infection.  He has baseline mild cognitive impairment and some relationship dysfunction with his wife.  He contracted covid somehow and she did not feel like she could care for him at home--he'd also fallen.  He wound up coming here.  He was treated with the monoclonal ab infusion by NP.  He's been getting IVFs and staff have been pushing po fluids.  As of today, he's feeling much better, ate a good breakfast and lunch and is eager to walk around the unit some with nursing staff.  His renal function improved on his labs with hydration.  He's disappointed that he has to stay for  14 days from his positive test before he can return home.  I updated his wife, [name], and daughter, Abigail Butts.  Past Medical History:  Diagnosis Date  . Anxiety   . BRADYCARDIA 11/13/2008  . Cataract   . Cholelithiasis   . COLONIC POLYPS, HX OF 02/04/2007  . DEPRESSION 02/04/2007  . DIABETES MELLITUS, TYPE II 02/04/2007  . DIVERTICULOSIS, COLON 02/04/2007  . FOREIGN BODY, ASPIRATION 12/04/2007  . GERD (gastroesophageal reflux disease)   . GLAUCOMA 03/08/2009  . Hepatitis A 1963  . HYPERLIPIDEMIA 12/04/2007  . HYPERTENSION 02/04/2007  . Memory loss   . OBSTRUCTIVE SLEEP APNEA 11/05/2009   -PSG 01/05/10 RDI 11, PLMI 56  . OSTEOARTHRITIS 02/04/2007  . Peptic stricture of esophagus   . PROSTATE CANCER 1997  . RENAL INSUFFICIENCY 11/13/2008  . SYNCOPE 12/31/2009  . THROMBOCYTOPENIA 04/17/2008  . THYROID NODULE, RIGHT 11/13/2008  . UNSPECIFIED ANEMIA 11/07/2007   Past Surgical History:  Procedure Laterality Date  . Heart Cartherization  2000  . PROSTATECTOMY  1997  . TONSILLECTOMY  1940's    Social History   Socioeconomic History  . Marital status: Married    Spouse name: Not on file  . Number of children: 3  . Years of education: college  . Highest education level: Bachelor's degree (e.g., BA, AB, BS)  Occupational History  . Occupation: Immunologist    Comment: Retired  Tobacco Use  . Smoking status: Former Smoker    Types: Cigarettes, Pipe    Quit date: 07/08/1993  Years since quitting: 26.0  . Smokeless tobacco: Never Used  Substance and Sexual Activity  . Alcohol use: Yes    Comment: minimal  . Drug use: No  . Sexual activity: Not on file  Other Topics Concern  . Not on file  Social History Narrative   He played tennis 3x a week before moving to [place].   Right-handed.   2 cups caffeine per day.      Social Determinants of Health   Financial Resource Strain:   . Difficulty of Paying Living Expenses: Not on file  Food Insecurity:   .  Worried About Charity fundraiser in the Last Year: Not on file  . Ran Out of Food in the Last Year: Not on file  Transportation Needs:   . Lack of Transportation (Medical): Not on file  . Lack of Transportation (Non-Medical): Not on file  Physical Activity:   . Days of Exercise per Week: Not on file  . Minutes of Exercise per Session: Not on file  Stress:   . Feeling of Stress : Not on file  Social Connections:   . Frequency of Communication with Friends and Family: Not on file  . Frequency of Social Gatherings with Friends and Family: Not on file  . Attends Religious Services: Not on file  . Active Member of Clubs or Organizations: Not on file  . Attends Archivist Meetings: Not on file  . Marital Status: Not on file    reports that he quit smoking about 26 years ago. His smoking use included cigarettes and pipe. He has never used smokeless tobacco. He reports current alcohol use. He reports that he does not use drugs.  Functional Status Survey:    Family History  Problem Relation Age of Onset  . Kidney disease Father        Bright's Disease - died at age 40  . Other Mother        no signifincant health problems - died at age 2  . Heart disease Neg Hx        No FH of Coronary Artery Disease  . Cancer Neg Hx   . Colon cancer Neg Hx   . Esophageal cancer Neg Hx   . Inflammatory bowel disease Neg Hx   . Liver disease Neg Hx   . Pancreatic cancer Neg Hx   . Rectal cancer Neg Hx   . Stomach cancer Neg Hx     Health Maintenance  Topic Date Due  . FOOT EXAM  05/25/2019  . HEMOGLOBIN A1C  07/14/2019  . OPHTHALMOLOGY EXAM  03/26/2020  . TETANUS/TDAP  12/03/2026  . INFLUENZA VACCINE  Completed  . PNA vac Low Risk Adult  Completed    Allergies  Allergen Reactions  . Lactose Intolerance (Gi)   . Sulfonamide Derivatives     REACTION: Itch/Red Spots    Outpatient Encounter Medications as of 07/24/2019  Medication Sig  . buPROPion (WELLBUTRIN) 100 MG tablet  Take 1 tablet (100 mg total) by mouth 2 (two) times daily.  . Cholecalciferol (VITAMIN D) 2000 UNITS CAPS Take 2 capsules by mouth daily.   . ferrous sulfate 325 (65 FE) MG EC tablet Take 325 mg by mouth daily.  Marland Kitchen levothyroxine (SYNTHROID) 25 MCG tablet TAKE ONE TABLET BY MOUTH IN THE MORNING BEFORE BREAKFAST  . lisinopril (ZESTRIL) 5 MG tablet TAKE ONE TABLET BY MOUTH ONE TIME DAILY   . rosuvastatin (CRESTOR) 40 MG tablet TAKE ONE TABLET BY MOUTH ONE  TIME DAILY  . traZODone (DESYREL) 100 MG tablet Take 1 tablet (100 mg total) by mouth at bedtime.  Marland Kitchen zinc gluconate 50 MG tablet Take 50 mg by mouth 2 (two) times daily. Special instructions:x14 days. Give  W/food once a morning 08:00 am -11:00am   No facility-administered encounter medications on file as of 07/24/2019.    Review of Systems  Constitutional: Positive for malaise/fatigue. Negative for chills and fever.  HENT: Positive for hearing loss. Negative for congestion and sore throat.   Eyes: Negative for blurred vision.  Respiratory: Negative for cough and shortness of breath.   Cardiovascular: Negative for chest pain, palpitations and leg swelling.  Gastrointestinal: Negative for abdominal pain, blood in stool, constipation, diarrhea, melena, nausea and vomiting.  Genitourinary: Negative for dysuria.  Musculoskeletal: Negative for falls and joint pain.  Skin: Negative for itching and rash.  Neurological: Positive for headaches. Negative for dizziness and loss of consciousness.  Endo/Heme/Allergies: Does not bruise/bleed easily.  Psychiatric/Behavioral: Positive for memory loss. Negative for depression. The patient is not nervous/anxious and does not have insomnia.        Now bored     Vitals:   07/24/19 1027  BP: 140/70  Pulse: 65  Temp: 98.1 F (36.7 C)  SpO2: 93%  Weight: 198 lb (89.8 kg)  Height: 5\' 11"  (1.803 m)   Body mass index is 27.62 kg/m. Physical Exam Vitals reviewed.  Constitutional:      General: He is not  in acute distress.    Appearance: Normal appearance. He is not ill-appearing or toxic-appearing.  HENT:     Head: Normocephalic and atraumatic.     Right Ear: External ear normal.     Left Ear: External ear normal.     Nose: Nose normal.     Mouth/Throat:     Pharynx: Oropharynx is clear. No oropharyngeal exudate.  Eyes:     Extraocular Movements: Extraocular movements intact.     Conjunctiva/sclera: Conjunctivae normal.     Pupils: Pupils are equal, round, and reactive to light.     Comments: glasses  Cardiovascular:     Rate and Rhythm: Normal rate and regular rhythm.     Pulses: Normal pulses.     Heart sounds: Normal heart sounds.  Pulmonary:     Effort: Pulmonary effort is normal.     Breath sounds: Normal breath sounds. No wheezing, rhonchi or rales.  Abdominal:     General: Bowel sounds are normal. There is no distension.     Palpations: Abdomen is soft.     Tenderness: There is no abdominal tenderness.  Musculoskeletal:        General: Normal range of motion.     Cervical back: Neck supple.     Right lower leg: No edema.     Left lower leg: No edema.  Lymphadenopathy:     Cervical: No cervical adenopathy.  Skin:    General: Skin is warm and dry.     Capillary Refill: Capillary refill takes less than 2 seconds.  Neurological:     General: No focal deficit present.     Mental Status: He is alert and oriented to person, place, and time. Mental status is at baseline.     Cranial Nerves: No cranial nerve deficit.     Sensory: No sensory deficit.     Motor: No weakness.     Coordination: Coordination normal.     Gait: Gait normal.     Deep Tendon Reflexes: Reflexes normal.  Psychiatric:        Mood and Affect: Mood normal.        Behavior: Behavior normal.     Labs reviewed: Basic Metabolic Panel: Recent Labs    01/11/19 0000 07/21/19 0000  NA 141 132*  K 4.8 4.5  CL  --  99  CO2  --  21  BUN 39* 34*  CREATININE 1.8* 1.9*  CALCIUM  --  7.9*   Liver  Function Tests: Recent Labs    01/11/19 0200  AST 19  ALT 15   No results for input(s): LIPASE, AMYLASE in the last 8760 hours. No results for input(s): AMMONIA in the last 8760 hours. CBC: Recent Labs    01/11/19 0000 01/22/19 1555 07/21/19 0000  WBC 8.6 11.7* 9.0  NEUTROABS  --  6,494 4  HGB 12.8* 12.0* 12.4*  HCT 37* 35.4* 36*  MCV  --  94.7  --   PLT 112* 148 87*   Cardiac Enzymes: No results for input(s): CKTOTAL, CKMB, CKMBINDEX, TROPONINI in the last 8760 hours. BNP: Invalid input(s): POCBNP Lab Results  Component Value Date   HGBA1C 6.4 01/11/2019   Lab Results  Component Value Date   TSH 4.42 03/13/2019   Lab Results  Component Value Date   VITAMINB12 430 01/22/2019   Lab Results  Component Value Date   FOLATE 19.4 04/08/2010   Lab Results  Component Value Date   IRON 97 01/11/2019   TIBC 299 04/08/2010   FERRITIN 192 04/08/2010    Imaging and Procedures obtained prior to SNF admission: MR BRAIN WO CONTRAST  Result Date: 04/17/2018  [facility] [address] [place], [place] [address] [phone] NEUROIMAGING REPORT STUDY DATE: 04/17/2018 PATIENT NAME: [name] DOB: [birth year] MRN: [id number] EXAM: MRI Brain without contrast ORDERING CLINICIAN: Marcial Pacas MD, PhD CLINICAL HISTORY: 86 year old man with memory loss COMPARISON FILMS: None TECHNIQUE: MRI of the brain without contrast was obtained utilizing 5 mm axial slices with T1, T2, T2 flair, SWI and diffusion weighted views.  T1 sagittal and T2 coronal views were obtained. CONTRAST: none IMAGING SITE: [facility], [place], [place] FINDINGS: On sagittal images, the spinal cord is imaged caudally to C3 and is normal in caliber.   The contents of the posterior fossa are of normal size and position.   The pituitary gland and optic chiasm appear normal.    There is mild generalized cortical atrophy, typical for age..  There are no abnormal extra-axial  collections of fluid.  The cerebellum and brainstem appears normal.   The deep gray matter appears normal.  There are scattered T2/FLAIR hyperintense foci predominantly in the deep white matter if you in the subcortical white matter.  None of these appear to be acute..  Diffusion weighted images are normal.  Susceptibility weighted images are normal.  The VIIth/VIIIth nerve complex appears normal.  There have been bilateral lens replacements.  The orbits are otherwise normal.  The mastoid air cells appear normal.  The paranasal sinuses appear normal.  Flow voids are identified within the major intracerebral arteries.     This MRI of the brain without contrast shows the following: 1.    Mild generalized cortical atrophy, typical for age. 2.    Mild chronic microvascular ischemic changes. 3     There are no acute findings. INTERPRETING PHYSICIAN: Richard A. Felecia Shelling, MD, PhD, FAAN Certified in  Neuroimaging by [employer]    Assessment/Plan 1. COVID-19 virus infection -  much improved as of today -eating and drinking better--does require a lot of encouragement to hydrate  2. Acute renal failure superimposed on stage 3b chronic kidney disease, unspecified acute renal failure type ([place]) -resolved with IVFs and pushing fluids -high risk for recurrence at home with ace, but is diabetic so needs   3. Delirium -resolved as of today when hydrated  4. Controlled type 2 diabetes mellitus with complication, without long-term current use of insulin ([place]) -last check in prediabetic range--does not like to eat healthy veggies per his wife--family will encourage  Lab Results  Component Value Date   HGBA1C 6.4 01/11/2019   5. Mild cognitive impairment -at baseline so prone to delirium -seems to have recovered well and can return home when 14 days from positive test  6. Constipation, unspecified constipation type -discussed adequate fiber, movement and hydration as most important for  him--wife encouraging -is also on iron which contributes  7. Hypothyroidism, unspecified type -borderline high tsh, will monitor outpatient Lab Results  Component Value Date   TSH 4.42 03/13/2019    8. Depression, major, single episode, mild ([place]) -seems better with this on wellbutrin and trazodone  9. Weakness Doing great, fever resolved, cough improved--rare, walking around unit with help, eating and drinking better.  Never a big drinker so needs encouragement.  Plan to discharge on weekend if he's continuing to progress when 14 day quarantine completed from 1/17 positive test--appears this is Sunday 1/31  Family/ staff Communication: spoke with Abigail Butts and [name] for 30 minutes today updating them on Jamear's condition  Labs/tests ordered: bmp 08/02/19, f/u with me 08/08/19 in [facility]. Rayshad Riviello, D.O. [facility] [address], [place] [address] Cell Phone (Mon-Fri 8am-5pm):  [phone] On Call:  [phone] & follow prompts after 5pm & weekends Office Phone:  [phone] Office Fax:  [phone]

## 2019-07-25 ENCOUNTER — Encounter: Payer: Self-pay | Admitting: Internal Medicine

## 2019-07-26 ENCOUNTER — Telehealth: Payer: Self-pay | Admitting: *Deleted

## 2019-07-26 NOTE — Telephone Encounter (Signed)
Will forward to [place].  [name] should be calling [facility] rather than our office with these concerns.  I've already informed Christy and [facility] of the plan for him to d/c on the weekend IF he continues improving.

## 2019-07-26 NOTE — Telephone Encounter (Signed)
[name], Wife called and stated that patient is in the Covid Unit at [facility] Stated that she spoke with him yesterday and she has some concerns:  1. Stated patient is not walking like he should. Stated that he is suppose to walk at least 3 times a day.   2. Stated that he told her that he only drank 3 glasses of water and he is suppose to drink at least 6.   3. Alyse Low is suppose to see him tomorrow and she wants to make sure she writes an order for him to be released on Sunday back to his apartment.

## 2019-07-27 ENCOUNTER — Non-Acute Institutional Stay (SKILLED_NURSING_FACILITY): Payer: Medicare Other | Admitting: Adult Health

## 2019-07-27 ENCOUNTER — Encounter: Payer: Self-pay | Admitting: Adult Health

## 2019-07-27 DIAGNOSIS — N179 Acute kidney failure, unspecified: Secondary | ICD-10-CM | POA: Diagnosis not present

## 2019-07-27 DIAGNOSIS — D696 Thrombocytopenia, unspecified: Secondary | ICD-10-CM | POA: Diagnosis not present

## 2019-07-27 DIAGNOSIS — N1831 Chronic kidney disease, stage 3a: Secondary | ICD-10-CM | POA: Diagnosis not present

## 2019-07-27 DIAGNOSIS — G3184 Mild cognitive impairment, so stated: Secondary | ICD-10-CM

## 2019-07-27 DIAGNOSIS — K5901 Slow transit constipation: Secondary | ICD-10-CM

## 2019-07-27 DIAGNOSIS — E1121 Type 2 diabetes mellitus with diabetic nephropathy: Secondary | ICD-10-CM | POA: Diagnosis not present

## 2019-07-27 DIAGNOSIS — U071 COVID-19: Secondary | ICD-10-CM | POA: Diagnosis not present

## 2019-07-27 DIAGNOSIS — E1122 Type 2 diabetes mellitus with diabetic chronic kidney disease: Secondary | ICD-10-CM

## 2019-07-27 DIAGNOSIS — I10 Essential (primary) hypertension: Secondary | ICD-10-CM

## 2019-07-27 NOTE — Progress Notes (Signed)
Location:  [facility] of Service:  SNF (31) Provider:   Cindi Carbon, ANP [place] [phone]   Gayland Curry, DO  Patient Care Team: Gayland Curry, DO as PCP - General (Geriatric Medicine) Sherren Mocha, MD as PCP - Cardiology (Cardiology) Nobie Putnam, MD (Hematology and Oncology) Martinique, Amy, MD as Consulting Physician (Dermatology)  Extended Emergency Contact Information Primary Emergency Contact: Eichenberger,Simone Address: [address]          [place], [place] [address] Johnnette Litter of [place] Phone: [phone] Mobile Phone: [phone] Relation: Spouse  Code Status:  Full code Goals of care: Advanced Directive information Advanced Directives 07/24/2019  Does Patient Have a Medical Advance Directive? Yes  Type of Advance Directive [place]  Does patient want to make changes to medical advance directive? No - Patient declined  Copy of [place] in Chart? -  Would patient like information on creating a medical advance directive? -     Chief Complaint  Patient presents with  . Discharge Note    HPI:  Pt is a 86 y.o. male seen today for an acute visit for discharge from [facility] rehab. He was admitted due to covid pos test rest performed on 1/16.  He was admitted to the rehab unit due to cough and weakness on 1/21.  He will complete isolation on 1/31 and would like to return home to his wife in [place].  He has underlying MCI and the staff have reported issues with short term memory loss. He initially was weak and had an unsteady gait but this resolved and he is walking around the unit several times a day with no assistance. He had a CXR with no acute findings on 1/21.  Initially he had acute on chronic renal failure with Cr of 2.1 from a baseline of 1.9.  He received NS IVF and this resolved. He is eating and drinking well. His cough has resolved. He has no fever, sob, or  low 02 sats. He reports constipation and took MOM this morning for no BM in two days.    Past Medical History:  Diagnosis Date  . Anxiety   . BRADYCARDIA 11/13/2008  . Cataract   . Cholelithiasis   . COLONIC POLYPS, HX OF 02/04/2007  . DEPRESSION 02/04/2007  . DIABETES MELLITUS, TYPE II 02/04/2007  . DIVERTICULOSIS, COLON 02/04/2007  . FOREIGN BODY, ASPIRATION 12/04/2007  . GERD (gastroesophageal reflux disease)   . GLAUCOMA 03/08/2009  . Hepatitis A 1963  . HYPERLIPIDEMIA 12/04/2007  . HYPERTENSION 02/04/2007  . Memory loss   . OBSTRUCTIVE SLEEP APNEA 11/05/2009   -PSG 01/05/10 RDI 11, PLMI 56  . OSTEOARTHRITIS 02/04/2007  . Peptic stricture of esophagus   . PROSTATE CANCER 1997  . RENAL INSUFFICIENCY 11/13/2008  . SYNCOPE 12/31/2009  . THROMBOCYTOPENIA 04/17/2008  . THYROID NODULE, RIGHT 11/13/2008  . UNSPECIFIED ANEMIA 11/07/2007   Past Surgical History:  Procedure Laterality Date  . Heart Cartherization  2000  . PROSTATECTOMY  1997  . TONSILLECTOMY  1940's    Allergies  Allergen Reactions  . Lactose Intolerance (Gi)   . Sulfonamide Derivatives     REACTION: Itch/Red Spots    Outpatient Encounter Medications as of 07/27/2019  Medication Sig  . buPROPion (WELLBUTRIN) 100 MG tablet Take 1 tablet (100 mg total) by mouth 2 (two) times daily.  . Cholecalciferol (VITAMIN D) 2000 UNITS CAPS Take 2 capsules by mouth daily.   Marland Kitchen  ferrous sulfate 325 (65 FE) MG EC tablet Take 325 mg by mouth daily.  Marland Kitchen levothyroxine (SYNTHROID) 25 MCG tablet TAKE ONE TABLET BY MOUTH IN THE MORNING BEFORE BREAKFAST  . lisinopril (ZESTRIL) 5 MG tablet TAKE ONE TABLET BY MOUTH ONE TIME DAILY   . rosuvastatin (CRESTOR) 40 MG tablet TAKE ONE TABLET BY MOUTH ONE TIME DAILY  . traZODone (DESYREL) 100 MG tablet Take 1 tablet (100 mg total) by mouth at bedtime.  Marland Kitchen zinc gluconate 50 MG tablet Take 50 mg by mouth 2 (two) times daily. Special instructions:x14 days. Give  W/food once a morning 08:00 am -11:00am   No  facility-administered encounter medications on file as of 07/27/2019.    Review of Systems  Constitutional: Negative for activity change, appetite change, chills, diaphoresis, fatigue, fever and unexpected weight change.  Respiratory: Negative for cough, shortness of breath, wheezing and stridor.   Cardiovascular: Negative for chest pain, palpitations and leg swelling.  Gastrointestinal: Positive for constipation. Negative for abdominal distention, abdominal pain and diarrhea.  Genitourinary: Negative for difficulty urinating and dysuria.  Musculoskeletal: Negative for arthralgias, back pain, gait problem, joint swelling and myalgias.  Neurological: Negative for dizziness, seizures, syncope, facial asymmetry, speech difficulty, weakness and headaches.  Hematological: Negative for adenopathy. Does not bruise/bleed easily.  Psychiatric/Behavioral: Negative for agitation, behavioral problems and confusion.       Short term memory loss    Immunization History  Administered Date(s) Administered  . Fluad Quad(high Dose 65+) 02/27/2019  . Influenza Split 04/05/2011, 04/04/2012  . Influenza Whole 03/27/2008, 04/15/2009, 03/20/2010  . Influenza, High Dose Seasonal PF 03/31/2016, 04/06/2017, 03/29/2018  . Influenza,inj,Quad PF,6+ Mos 04/03/2013, 03/14/2015  . Pneumococcal Conjugate-13 03/14/2015  . Pneumococcal Polysaccharide-23 06/29/1995  . Td 11/13/2004  . Tdap 12/02/2016  . Zoster 06/28/2004  . Zoster Recombinat (Shingrix) 12/30/2017, 05/22/2018   Pertinent  Health Maintenance Due  Topic Date Due  . FOOT EXAM  05/25/2019  . HEMOGLOBIN A1C  07/14/2019  . OPHTHALMOLOGY EXAM  03/26/2020  . INFLUENZA VACCINE  Completed  . PNA vac Low Risk Adult  Completed   Fall Risk  05/16/2019 05/16/2019 01/22/2019 01/10/2019 07/05/2018  Falls in the past year? - 1 0 0 0  Number falls in past yr: - 1 0 0 0  Injury with Fall? - 0 0 0 0  Risk for fall due to : Impaired balance/gait;Mental status  change;Medication side effect History of fall(s) - - -  Follow up Falls evaluation completed;Education provided;Falls prevention discussed - - - -   Functional Status Survey:    Vitals:   07/27/19 1136  BP: 122/60  Pulse: 66  Resp: 16  Temp: (!) 97.3 F (36.3 C)  SpO2: 93%   There is no height or weight on file to calculate BMI. Physical Exam Vitals and nursing note reviewed.  Constitutional:      General: He is not in acute distress.    Appearance: He is not diaphoretic.  HENT:     Head: Normocephalic and atraumatic.     Nose: Nose normal. No congestion.     Mouth/Throat:     Mouth: Mucous membranes are moist.     Pharynx: Oropharynx is clear. No oropharyngeal exudate.  Neck:     Thyroid: No thyromegaly.     Vascular: No JVD.     Trachea: No tracheal deviation.  Cardiovascular:     Rate and Rhythm: Normal rate and regular rhythm.     Heart sounds: No murmur.  Pulmonary:  Effort: Pulmonary effort is normal. No respiratory distress.     Breath sounds: Normal breath sounds. No wheezing.  Abdominal:     General: Bowel sounds are normal. There is no distension.     Palpations: Abdomen is soft.     Tenderness: There is no abdominal tenderness.  Lymphadenopathy:     Cervical: No cervical adenopathy.  Skin:    General: Skin is warm and dry.  Neurological:     Mental Status: He is alert and oriented to person, place, and time.     Cranial Nerves: No cranial nerve deficit.     Labs reviewed: Recent Labs    01/11/19 0000 07/21/19 0000  NA 141 132*  K 4.8 4.5  CL  --  99  CO2  --  21  BUN 39* 34*  CREATININE 1.8* 1.9*  CALCIUM  --  7.9*   Recent Labs    01/11/19 0200  AST 19  ALT 15   Recent Labs    01/11/19 0000 01/22/19 1555 07/21/19 0000  WBC 8.6 11.7* 9.0  NEUTROABS  --  6,494 4  HGB 12.8* 12.0* 12.4*  HCT 37* 35.4* 36*  MCV  --  94.7  --   PLT 112* 148 87*   Lab Results  Component Value Date   TSH 4.42 03/13/2019   Lab Results    Component Value Date   HGBA1C 6.4 01/11/2019   Lab Results  Component Value Date   CHOL 137 01/11/2019   HDL 36 01/11/2019   LDLCALC 76 01/11/2019   TRIG 121 01/11/2019   CHOLHDL 4 11/22/2017    Significant Diagnostic Results in last 30 days:  No results found.  Assessment/Plan 1. COVID-19 virus infection Doing well, may discharge home when isolation complete 1/31 (14 days)  2. Acute renal failure, unspecified acute renal failure type ([place]) Resolved, has underlying CKD and should monitor his intake.   3. Hypertension, unspecified type Initially he had elevated readings 148-160 but his BP 07/27/2019 is 122/60. He should continue his current regimen and f/u with Dr. Mariea Clonts  4. Type 2 diabetes mellitus with stage 3a chronic kidney disease, without long-term current use of insulin (HCC) Needs A1C at future visit. No currently on meds.   5. Slow transit constipation Add Miralax 17 grams qd with 6-8 oz qd prn  6. MCI (mild cognitive impairment) with memory loss 07/23/19: MMSE 29/30  7. Thrombocytopenia Lab Results  Component Value Date   PLT 87 (A) 07/21/2019  Likely reduced counts due to covid but plts were slightly low earlier this year.  No issues with bleeding or bruising. F/U with Dr. Mariea Clonts to ensure resolution.   Family/ staff Communication: discussed with his wife Semone  Labs/tests ordered:  F/U BMP CBC and A1C

## 2019-08-01 ENCOUNTER — Telehealth: Payer: Self-pay | Admitting: *Deleted

## 2019-08-01 NOTE — Telephone Encounter (Signed)
Patient wife, [name] called and stated that patient has an appointment next week with Dr. Mariea Clonts at [facility]. Stated that she will be with patient. Wants you to discuss at that appointment Patient's driving. Stated that they do not want patient to drive anymore. Stated that patient is about to turn 61, stated he is ok but his driving is not good nor his sense of direction. Stated she doesn't need a call back just wants you to discuss next week.

## 2019-08-01 NOTE — Telephone Encounter (Signed)
Noted  

## 2019-08-02 DIAGNOSIS — N1832 Chronic kidney disease, stage 3b: Secondary | ICD-10-CM | POA: Diagnosis not present

## 2019-08-02 DIAGNOSIS — D649 Anemia, unspecified: Secondary | ICD-10-CM | POA: Diagnosis not present

## 2019-08-02 LAB — BASIC METABOLIC PANEL
BUN: 30 — AB (ref 4–21)
CO2: 24 — AB (ref 13–22)
Chloride: 104 (ref 99–108)
Creatinine: 2 — AB (ref 0.6–1.3)
Glucose: 100
Potassium: 4.9 (ref 3.4–5.3)
Sodium: 139 (ref 137–147)

## 2019-08-02 LAB — COMPREHENSIVE METABOLIC PANEL: Calcium: 8.9 (ref 8.7–10.7)

## 2019-08-03 ENCOUNTER — Telehealth: Payer: Self-pay

## 2019-08-03 ENCOUNTER — Encounter: Payer: Self-pay | Admitting: Internal Medicine

## 2019-08-03 NOTE — Telephone Encounter (Signed)
Called patient with results, but he could not here, will discuss lab at next appointment. .Kidney function is stable sine his rehab stay. Per Dr. Mariea Clonts.

## 2019-08-08 ENCOUNTER — Non-Acute Institutional Stay: Payer: Medicare Other | Admitting: Internal Medicine

## 2019-08-08 ENCOUNTER — Other Ambulatory Visit: Payer: Self-pay

## 2019-08-08 ENCOUNTER — Encounter: Payer: Self-pay | Admitting: Internal Medicine

## 2019-08-08 VITALS — BP 122/64 | HR 64 | Temp 98.1°F | Ht 71.0 in | Wt 193.6 lb

## 2019-08-08 DIAGNOSIS — K5901 Slow transit constipation: Secondary | ICD-10-CM

## 2019-08-08 DIAGNOSIS — U071 COVID-19: Secondary | ICD-10-CM

## 2019-08-08 DIAGNOSIS — I10 Essential (primary) hypertension: Secondary | ICD-10-CM

## 2019-08-08 DIAGNOSIS — E1121 Type 2 diabetes mellitus with diabetic nephropathy: Secondary | ICD-10-CM

## 2019-08-08 DIAGNOSIS — N1831 Chronic kidney disease, stage 3a: Secondary | ICD-10-CM | POA: Diagnosis not present

## 2019-08-08 DIAGNOSIS — E1122 Type 2 diabetes mellitus with diabetic chronic kidney disease: Secondary | ICD-10-CM

## 2019-08-08 DIAGNOSIS — Z9189 Other specified personal risk factors, not elsewhere classified: Secondary | ICD-10-CM

## 2019-08-08 DIAGNOSIS — G3184 Mild cognitive impairment, so stated: Secondary | ICD-10-CM

## 2019-08-08 NOTE — Progress Notes (Signed)
Location:  [facility] Main clinic Provider:  Brynne Doane L. Mariea Clonts, D.O., C.M.D.  Goals of Care:  Advanced Directives 08/08/2019  Does Patient Have a Medical Advance Directive? Yes  Type of Advance Directive [place]  Does patient want to make changes to medical advance directive? No - Patient declined  Copy of [place] in Chart? -  Would patient like information on creating a medical advance directive? -     Chief Complaint  Patient presents with  . Medical Management of Chronic Issues    follow up appt. , prescriptions     HPI: Patient is a 86 y.o. male seen today for medical management of chronic diseases.  F/u after rehab stay for covid.  Wife has concerns about his driving with his cognitive decline.    Thinks his bowels are almost back to normal. 1 movement in 1-2 days.  He would have to go but not eliminate much until yesterday.  He is having fiber in water, juice before and water after.    He has been out walking a couple of times since being back home after covid rehab.  Appetite is ok, but not as good as it used to be.  His wife says he's not eating near as much.  [name] is making him healthier foods.  She thinks it started before his stay.  He says he's drinking a lot of water.  He says 5-6 glasses.  He had some with pills this am, but otherwise only coffee.   He reports his weight is 6 lbs less than it used to be first thing in the am.    BP was excellent this am.    Past Medical History:  Diagnosis Date  . Anxiety   . BRADYCARDIA 11/13/2008  . Cataract   . Cholelithiasis   . COLONIC POLYPS, HX OF 02/04/2007  . DEPRESSION 02/04/2007  . DIABETES MELLITUS, TYPE II 02/04/2007  . DIVERTICULOSIS, COLON 02/04/2007  . FOREIGN BODY, ASPIRATION 12/04/2007  . GERD (gastroesophageal reflux disease)   . GLAUCOMA 03/08/2009  . Hepatitis A 1963  . HYPERLIPIDEMIA 12/04/2007  . HYPERTENSION 02/04/2007  . Memory loss   . OBSTRUCTIVE SLEEP APNEA 11/05/2009   -PSG  01/05/10 RDI 11, PLMI 56  . OSTEOARTHRITIS 02/04/2007  . Peptic stricture of esophagus   . PROSTATE CANCER 1997  . RENAL INSUFFICIENCY 11/13/2008  . SYNCOPE 12/31/2009  . THROMBOCYTOPENIA 04/17/2008  . THYROID NODULE, RIGHT 11/13/2008  . UNSPECIFIED ANEMIA 11/07/2007    Past Surgical History:  Procedure Laterality Date  . Heart Cartherization  2000  . PROSTATECTOMY  1997  . TONSILLECTOMY  1940's    Allergies  Allergen Reactions  . Lactose Intolerance (Gi)   . Sulfonamide Derivatives     REACTION: Itch/Red Spots    Outpatient Encounter Medications as of 08/08/2019  Medication Sig  . buPROPion (WELLBUTRIN) 100 MG tablet Take 1 tablet (100 mg total) by mouth 2 (two) times daily.  . Cholecalciferol (VITAMIN D) 2000 UNITS CAPS Take 2 capsules by mouth daily.   . ferrous sulfate 325 (65 FE) MG EC tablet Take 325 mg by mouth daily.  Marland Kitchen levothyroxine (SYNTHROID) 25 MCG tablet TAKE ONE TABLET BY MOUTH IN THE MORNING BEFORE BREAKFAST  . lisinopril (ZESTRIL) 5 MG tablet TAKE ONE TABLET BY MOUTH ONE TIME DAILY   . rosuvastatin (CRESTOR) 40 MG tablet TAKE ONE TABLET BY MOUTH ONE TIME DAILY  . traZODone (DESYREL) 100 MG tablet Take 1 tablet (100 mg total) by  mouth at bedtime.  . [DISCONTINUED] zinc gluconate 50 MG tablet Take 50 mg by mouth 2 (two) times daily. Special instructions:x14 days. Give  W/food once a morning 08:00 am -11:00am   No facility-administered encounter medications on file as of 08/08/2019.    Review of Systems:  Review of Systems  Constitutional: Positive for malaise/fatigue. Negative for chills and fever.  HENT: Positive for hearing loss. Negative for congestion and sore throat.   Eyes: Negative for blurred vision.  Respiratory: Negative for cough and shortness of breath.   Cardiovascular: Negative for chest pain, palpitations and leg swelling.  Gastrointestinal: Negative for abdominal pain, blood in stool, constipation, diarrhea and melena.       Bms almost back to  normal  Genitourinary: Negative for dysuria.       Nocturia 3-4x if drinks water too close to bedtime  Musculoskeletal: Negative for back pain, falls and joint pain.  Skin: Negative for itching and rash.  Neurological: Negative for dizziness and loss of consciousness.  Endo/Heme/Allergies: Bruises/bleeds easily.  Psychiatric/Behavioral: Positive for depression and memory loss. The patient is not nervous/anxious and does not have insomnia.     Health Maintenance  Topic Date Due  . FOOT EXAM  05/25/2019  . HEMOGLOBIN A1C  07/14/2019  . OPHTHALMOLOGY EXAM  03/26/2020  . TETANUS/TDAP  12/03/2026  . INFLUENZA VACCINE  Completed  . PNA vac Low Risk Adult  Completed    Physical Exam: Vitals:   08/08/19 1007  BP: 122/64  Pulse: 64  Temp: 98.1 F (36.7 C)  TempSrc: Temporal  SpO2: 96%  Weight: 193 lb 9.6 oz (87.8 kg)  Height: 5\' 11"  (1.803 m)   Body mass index is 27 kg/m. Physical Exam Vitals reviewed.  Constitutional:      General: He is not in acute distress.    Appearance: Normal appearance. He is normal weight. He is not toxic-appearing.  HENT:     Head: Normocephalic and atraumatic.  Eyes:     Extraocular Movements: Extraocular movements intact.     Conjunctiva/sclera: Conjunctivae normal.     Pupils: Pupils are equal, round, and reactive to light.  Cardiovascular:     Rate and Rhythm: Normal rate and regular rhythm.     Pulses: Normal pulses.     Heart sounds: Normal heart sounds.  Pulmonary:     Effort: Pulmonary effort is normal.     Breath sounds: Normal breath sounds. No wheezing, rhonchi or rales.  Abdominal:     General: Bowel sounds are normal.     Palpations: Abdomen is soft.  Musculoskeletal:        General: Normal range of motion.     Right lower leg: No edema.     Left lower leg: No edema.  Skin:    General: Skin is warm and dry.     Capillary Refill: Capillary refill takes less than 2 seconds.  Neurological:     General: No focal deficit  present.     Mental Status: He is alert and oriented to person, place, and time. Mental status is at baseline.     Cranial Nerves: No cranial nerve deficit.     Motor: No weakness.  Psychiatric:        Mood and Affect: Mood normal.        Behavior: Behavior normal.     Labs reviewed: Basic Metabolic Panel: Recent Labs    01/11/19 0000 01/11/19 0200 03/13/19 0000 07/21/19 0000 07/23/19 0000 08/02/19 0500  NA 141  --   --   --  132* 139  K 4.8  --   --   --  4.5 4.9  CL  --   --   --   --  99 104  CO2  --   --   --   --  21 24*  BUN 39*  --   --   --  34* 30*  CREATININE 1.8*  --   --   --  1.9* 2.0*  CALCIUM  --   --   --  7.9*  --  8.9  TSH  --  5.42 4.42  --   --   --    Liver Function Tests: Recent Labs    01/11/19 0200  AST 19  ALT 15   No results for input(s): LIPASE, AMYLASE in the last 8760 hours. No results for input(s): AMMONIA in the last 8760 hours. CBC: Recent Labs    01/11/19 0000 01/22/19 1555 07/21/19 0000  WBC 8.6 11.7* 9.0  NEUTROABS  --  6,494 4  HGB 12.8* 12.0* 12.4*  HCT 37* 35.4* 36*  MCV  --  94.7  --   PLT 112* 148 87*   Lipid Panel: Recent Labs    01/11/19 0000  CHOL 137  HDL 36  LDLCALC 76  TRIG 121   Lab Results  Component Value Date   HGBA1C 6.4 01/11/2019   Assessment/Plan 1. Driving safety issue - his wife is concerned--reports he got lost on one occasion -only goes to familiar close-by places ([employer], [employer] and one or two others) -no accidents  - we agreed upon a referral to OT for a driving safety eval--I'm trying to remain neutral in this--he has mild cognitive impairment - Ambulatory referral to Occupational Therapy  2. Slow transit constipation -cont fiber, prune juice and adequate hydration, walking  3. 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 Type 2 diabetes mellitus with stage 3a chronic kidney disease, without long-term current use of insulin ([place]) -has been well controlled -f/u hba1c before next visit, continue healthier diet and regular walking  5. Hypertension, unspecified type -bp well controlled today, cont same regimen and monitor  6. COVID-19 virus infection -has recovered well  Labs/tests ordered:  Cbc,cmp with gfr, hba1c Next appt:  11/14/2019  Malessa Zartman L. Kannan Proia, D.O. [facility] [address], [place] [address] Cell Phone (Mon-Fri 8am-5pm):  [phone] On Call:  [phone] & follow prompts after 5pm & weekends Office Phone:  [phone] Office Fax:  [phone]

## 2019-09-06 ENCOUNTER — Other Ambulatory Visit: Payer: Self-pay

## 2019-09-06 ENCOUNTER — Encounter: Payer: Self-pay | Admitting: Family

## 2019-09-06 ENCOUNTER — Ambulatory Visit (INDEPENDENT_AMBULATORY_CARE_PROVIDER_SITE_OTHER): Payer: Medicare Other | Admitting: Family

## 2019-09-06 DIAGNOSIS — Z0000 Encounter for general adult medical examination without abnormal findings: Secondary | ICD-10-CM

## 2019-09-06 NOTE — Patient Instructions (Signed)
[name] , Thank you for taking time to come for your Medicare Wellness Visit. I appreciate your ongoing commitment to your health goals. Please review the following plan we discussed and let me know if I can assist you in the future.   Screening recommendations/referrals: Colonoscopy: Aged out  Recommended yearly ophthalmology/optometry visit for glaucoma screening and checkup Recommended yearly dental visit for hygiene and checkup  Vaccinations: Influenza vaccine: Up to date  Pneumococcal vaccine : Up to date  Tdap vaccine : Up to date due 12/03/2026  Shingles vaccine : : Up to date    Advanced directives: Yes    Conditions/risks identified: Advance age men > 52 yrs,Dyslipidemia,Hypertension,male Gender and Hx of smoking   Next appointment: 1 year   Preventive Care 44 Years and Older, Male Preventive care refers to lifestyle choices and visits with your health care provider that can promote health and wellness. What does preventive care include?  A yearly physical exam. This is also called an annual well check.  Dental exams once or twice a year.  Routine eye exams. Ask your health care provider how often you should have your eyes checked.  Personal lifestyle choices, including:  Daily care of your teeth and gums.  Regular physical activity.  Eating a healthy diet.  Avoiding tobacco and drug use.  Limiting alcohol use.  Practicing safe sex.  Taking low doses of aspirin every day.  Taking vitamin and mineral supplements as recommended by your health care provider. What happens during an annual well check? The services and screenings done by your health care provider during your annual well check will depend on your age, overall health, lifestyle risk factors, and family history of disease. Counseling  Your health care provider may ask you questions about your:  Alcohol use.  Tobacco use.  Drug use.  Emotional well-being.  Home and relationship  well-being.  Sexual activity.  Eating habits.  History of falls.  Memory and ability to understand (cognition).  Work and work Statistician. Screening  You may have the following tests or measurements:  Height, weight, and BMI.  Blood pressure.  Lipid and cholesterol levels. These may be checked every 5 years, or more frequently if you are over 77 years old.  Skin check.  Lung cancer screening. You may have this screening every year starting at age 66 if you have a 30-pack-year history of smoking and currently smoke or have quit within the past 15 years.  Fecal occult blood test (FOBT) of the stool. You may have this test every year starting at age 10.  Flexible sigmoidoscopy or colonoscopy. You may have a sigmoidoscopy every 5 years or a colonoscopy every 10 years starting at age 36.  Prostate cancer screening. Recommendations will vary depending on your family history and other risks.  Hepatitis C blood test.  Hepatitis B blood test.  Sexually transmitted disease (STD) testing.  Diabetes screening. This is done by checking your blood sugar (glucose) after you have not eaten for a while (fasting). You may have this done every 1-3 years.  Abdominal aortic aneurysm (AAA) screening. You may need this if you are a current or former smoker.  Osteoporosis. You may be screened starting at age 67 if you are at high risk. Talk with your health care provider about your test results, treatment options, and if necessary, the need for more tests. Vaccines  Your health care provider may recommend certain vaccines, such as:  Influenza vaccine. This is recommended every year.  Tetanus,  diphtheria, and acellular pertussis (Tdap, Td) vaccine. You may need a Td booster every 10 years.  Zoster vaccine. You may need this after age 65.  Pneumococcal 13-valent conjugate (PCV13) vaccine. One dose is recommended after age 63.  Pneumococcal polysaccharide (PPSV23) vaccine. One dose is  recommended after age 53. Talk to your health care provider about which screenings and vaccines you need and how often you need them. This information is not intended to replace advice given to you by your health care provider. Make sure you discuss any questions you have with your health care provider. Document Released: 07/11/2015 Document Revised: 03/03/2016 Document Reviewed: 04/15/2015 Elsevier Interactive Patient Education  2017 [place] Prevention in the Home Falls can cause injuries. They can happen to people of all ages. There are many things you can do to make your home safe and to help prevent falls. What can I do on the outside of my home?  Regularly fix the edges of walkways and driveways and fix any cracks.  Remove anything that might make you trip as you walk through a door, such as a raised step or threshold.  Trim any bushes or trees on the path to your home.  Use bright outdoor lighting.  Clear any walking paths of anything that might make someone trip, such as rocks or tools.  Regularly check to see if handrails are loose or broken. Make sure that both sides of any steps have handrails.  Any raised decks and porches should have guardrails on the edges.  Have any leaves, snow, or ice cleared regularly.  Use sand or salt on walking paths during winter.  Clean up any spills in your garage right away. This includes oil or grease spills. What can I do in the bathroom?  Use night lights.  Install grab bars by the toilet and in the tub and shower. Do not use towel bars as grab bars.  Use non-skid mats or decals in the tub or shower.  If you need to sit down in the shower, use a plastic, non-slip stool.  Keep the floor dry. Clean up any water that spills on the floor as soon as it happens.  Remove soap buildup in the tub or shower regularly.  Attach bath mats securely with double-sided non-slip rug tape.  Do not have throw rugs and other things on  the floor that can make you trip. What can I do in the bedroom?  Use night lights.  Make sure that you have a light by your bed that is easy to reach.  Do not use any sheets or blankets that are too big for your bed. They should not hang down onto the floor.  Have a firm chair that has side arms. You can use this for support while you get dressed.  Do not have throw rugs and other things on the floor that can make you trip. What can I do in the kitchen?  Clean up any spills right away.  Avoid walking on wet floors.  Keep items that you use a lot in easy-to-reach places.  If you need to reach something above you, use a strong step stool that has a grab bar.  Keep electrical cords out of the way.  Do not use floor polish or wax that makes floors slippery. If you must use wax, use non-skid floor wax.  Do not have throw rugs and other things on the floor that can make you trip. What can I do with  my stairs?  Do not leave any items on the stairs.  Make sure that there are handrails on both sides of the stairs and use them. Fix handrails that are broken or loose. Make sure that handrails are as long as the stairways.  Check any carpeting to make sure that it is firmly attached to the stairs. Fix any carpet that is loose or worn.  Avoid having throw rugs at the top or bottom of the stairs. If you do have throw rugs, attach them to the floor with carpet tape.  Make sure that you have a light switch at the top of the stairs and the bottom of the stairs. If you do not have them, ask someone to add them for you. What else can I do to help prevent falls?  Wear shoes that:  Do not have high heels.  Have rubber bottoms.  Are comfortable and fit you well.  Are closed at the toe. Do not wear sandals.  If you use a stepladder:  Make sure that it is fully opened. Do not climb a closed stepladder.  Make sure that both sides of the stepladder are locked into place.  Ask someone to  hold it for you, if possible.  Clearly mark and make sure that you can see:  Any grab bars or handrails.  First and last steps.  Where the edge of each step is.  Use tools that help you move around (mobility aids) if they are needed. These include:  Canes.  Walkers.  Scooters.  Crutches.  Turn on the lights when you go into a dark area. Replace any light bulbs as soon as they burn out.  Set up your furniture so you have a clear path. Avoid moving your furniture around.  If any of your floors are uneven, fix them.  If there are any pets around you, be aware of where they are.  Review your medicines with your doctor. Some medicines can make you feel dizzy. This can increase your chance of falling. Ask your doctor what other things that you can do to help prevent falls. This information is not intended to replace advice given to you by your health care provider. Make sure you discuss any questions you have with your health care provider. Document Released: 04/10/2009 Document Revised: 11/20/2015 Document Reviewed: 07/19/2014 Elsevier Interactive Patient Education  2017 [employer].

## 2019-09-06 NOTE — Progress Notes (Signed)
Patient ID: [name], male   DOB: 1928-05-07, 86 y.o.   MRN: [id number] This service is provided via telemedicine  No vital signs collected/recorded due to the encounter was a telemedicine visit.   Location of patient (ex: home, work):  HOME  Patient consents to a telephone visit:  YES  Location of the provider (ex: office, home):  OFFICE  Name of any referring provider:  TIFFANY REED, DO   Names of all persons participating in the telemedicine service and their role in the encounter:  PATIENT, [name], [place], Pateros, NP  Time spent on call:  7:02

## 2019-09-06 NOTE — Progress Notes (Signed)
Subjective:   [name] is a 86 y.o. male who presents for Medicare Annual/Subsequent preventive examination.  Review of Systems:   Cardiac Risk Factors include: advanced age (>79men, >68 women);dyslipidemia;hypertension;male gender;smoking/ tobacco exposure     Objective:    Vitals: There were no vitals taken for this visit.  There is no height or weight on file to calculate BMI.  Advanced Directives 09/06/2019 08/08/2019 07/24/2019 05/16/2019 05/24/2018 03/08/2018 02/23/2018  Does Patient Have a Medical Advance Directive? Yes Yes Yes Yes Yes No No  Type of Paramedic of [place];Living will Healthcare Power of [place] will Living will;Healthcare Power of Attorney - -  Does patient want to make changes to medical advance directive? No - Patient declined No - Patient declined No - Patient declined No - Patient declined No - Patient declined - -  Copy of [place] in Chart? Yes - validated most recent copy scanned in chart (See row information) - - - No - copy requested - -  Would patient like information on creating a medical advance directive? - - - - - - -    Tobacco Social History   Tobacco Use  Smoking Status Former Smoker  . Types: Cigarettes, Pipe  . Quit date: 07/08/1993  . Years since quitting: 26.1  Smokeless Tobacco Never Used     Counseling given: Not Answered   Clinical Intake:  Pre-visit preparation completed: No  Pain : No/denies pain     BMI - recorded: 27 Nutritional Status: BMI 25 -29 Overweight Nutritional Risks: None Diabetes: No  How often do you need to have someone help you when you read instructions, pamphlets, or other written materials from your doctor or pharmacy?: 1 - Never What is the last grade level you completed in school?: 4 Yrs of College  Interpreter Needed?: No  Information entered by :: Kabrina Christiano FNP-C  Past Medical History:  Diagnosis Date    . Anxiety   . BRADYCARDIA 11/13/2008  . Cataract   . Cholelithiasis   . COLONIC POLYPS, HX OF 02/04/2007  . DEPRESSION 02/04/2007  . DIABETES MELLITUS, TYPE II 02/04/2007  . DIVERTICULOSIS, COLON 02/04/2007  . FOREIGN BODY, ASPIRATION 12/04/2007  . GERD (gastroesophageal reflux disease)   . GLAUCOMA 03/08/2009  . Hepatitis A 1963  . HYPERLIPIDEMIA 12/04/2007  . HYPERTENSION 02/04/2007  . Memory loss   . OBSTRUCTIVE SLEEP APNEA 11/05/2009   -PSG 01/05/10 RDI 11, PLMI 56  . OSTEOARTHRITIS 02/04/2007  . Peptic stricture of esophagus   . PROSTATE CANCER 1997  . RENAL INSUFFICIENCY 11/13/2008  . SYNCOPE 12/31/2009  . THROMBOCYTOPENIA 04/17/2008  . THYROID NODULE, RIGHT 11/13/2008  . UNSPECIFIED ANEMIA 11/07/2007   Past Surgical History:  Procedure Laterality Date  . Heart Cartherization  2000  . PROSTATECTOMY  1997  . TONSILLECTOMY  1940's   Family History  Problem Relation Age of Onset  . Kidney disease Father        Bright's Disease - died at age 12  . Other Mother        no signifincant health problems - died at age 59  . Heart disease Neg Hx        No FH of Coronary Artery Disease  . Cancer Neg Hx   . Colon cancer Neg Hx   . Esophageal cancer Neg Hx   . Inflammatory bowel disease Neg Hx   . Liver disease Neg Hx   . Pancreatic cancer Neg  Hx   . Rectal cancer Neg Hx   . Stomach cancer Neg Hx    Social History   Socioeconomic History  . Marital status: Married    Spouse name: Not on file  . Number of children: 3  . Years of education: college  . Highest education level: Bachelor's degree (e.g., BA, AB, BS)  Occupational History  . Occupation: Immunologist    Comment: Retired  Tobacco Use  . Smoking status: Former Smoker    Types: Cigarettes, Pipe    Quit date: 07/08/1993    Years since quitting: 26.1  . Smokeless tobacco: Never Used  Substance and Sexual Activity  . Alcohol use: Yes    Comment: minimal  . Drug use: No  . Sexual activity: Not on  file  Other Topics Concern  . Not on file  Social History Narrative   He played tennis 3x a week before moving to [place].   Right-handed.   2 cups caffeine per day.      Social Determinants of Health   Financial Resource Strain:   . Difficulty of Paying Living Expenses:   Food Insecurity:   . Worried About Charity fundraiser in the Last Year:   . Arboriculturist in the Last Year:   Transportation Needs:   . Film/video editor (Medical):   Marland Kitchen Lack of Transportation (Non-Medical):   Physical Activity:   . Days of Exercise per Week:   . Minutes of Exercise per Session:   Stress:   . Feeling of Stress :   Social Connections:   . Frequency of Communication with Friends and Family:   . Frequency of Social Gatherings with Friends and Family:   . Attends Religious Services:   . Active Member of Clubs or Organizations:   . Attends Archivist Meetings:   Marland Kitchen Marital Status:     Outpatient Encounter Medications as of 09/06/2019  Medication Sig  . buPROPion (WELLBUTRIN) 100 MG tablet Take 1 tablet (100 mg total) by mouth 2 (two) times daily.  . Cholecalciferol (VITAMIN D) 2000 UNITS CAPS Take 2 capsules by mouth daily.   . ferrous sulfate 325 (65 FE) MG EC tablet Take 325 mg by mouth daily.  Marland Kitchen levothyroxine (SYNTHROID) 25 MCG tablet TAKE ONE TABLET BY MOUTH IN THE MORNING BEFORE BREAKFAST  . lisinopril (ZESTRIL) 5 MG tablet TAKE ONE TABLET BY MOUTH ONE TIME DAILY   . rosuvastatin (CRESTOR) 40 MG tablet TAKE ONE TABLET BY MOUTH ONE TIME DAILY  . traZODone (DESYREL) 100 MG tablet Take 1 tablet (100 mg total) by mouth at bedtime.   No facility-administered encounter medications on file as of 09/06/2019.    Activities of Daily Living In your present state of health, do you have any difficulty performing the following activities: 09/06/2019  Hearing? Y  Comment wears hearing aids  Vision? N  Difficulty concentrating or making decisions? Y  Comment Concentrating  sometimes  Walking or climbing stairs? N  Dressing or bathing? N  Doing errands, shopping? N  Preparing Food and eating ? N  Using the Toilet? N  In the past six months, have you accidently leaked urine? Y  Do you have problems with loss of bowel control? N  Managing your Medications? N  Managing your Finances? N  Housekeeping or managing your Housekeeping? Y  Comment has house keeping  Some recent data might be hidden    Patient Care Team: Gayland Curry, DO as PCP -  General (Geriatric Medicine) Sherren Mocha, MD as PCP - Cardiology (Cardiology) Nobie Putnam, MD (Hematology and Oncology) Martinique, Amy, MD as Consulting Physician (Dermatology)   Assessment:   This is a routine wellness examination for Izaih.  Exercise Activities and Dietary recommendations Current Exercise Habits: Home exercise routine, Type of exercise: walking, Time (Minutes): 30, Frequency (Times/Week): 6, Weekly Exercise (Minutes/Week): 180, Intensity: Moderate, Exercise limited by: None identified  Goals   None     Fall Risk Fall Risk  09/06/2019 08/08/2019 05/16/2019 05/16/2019 01/22/2019  Falls in the past year? 0 0 - 1 0  Number falls in past yr: 0 0 - 1 0  Injury with Fall? 0 0 - 0 0  Risk for fall due to : - - Impaired balance/gait;Mental status change;Medication side effect History of fall(s) -  Follow up - - Falls evaluation completed;Education provided;Falls prevention discussed - -   Is the patient's home free of loose throw rugs in walkways, pet beds, electrical cords, etc?   no      Grab bars in the bathroom? yes      Handrails on the stairs?   no      Adequate lighting?   yes  Depression Screen PHQ 2/9 Scores 09/06/2019 08/08/2019 01/22/2019 01/10/2019  PHQ - 2 Score 0 0 0 0  Exception Documentation - - - -    Cognitive Function MMSE - Mini Mental State Exam 03/07/2018  Orientation to time 5  Orientation to Place 5  Registration 3  Attention/ Calculation 5  Recall 2  Language- name 2  objects 2  Language- repeat 1  Language- follow 3 step command 3  Language- read & follow direction 1  Write a sentence 1  Copy design 1  Total score 29     6CIT Screen 09/06/2019  What Year? 0 points  What month? 0 points  What time? 0 points  Count back from 20 0 points  Months in reverse 0 points  Repeat phrase 0 points  Total Score 0    Immunization History  Administered Date(s) Administered  . Fluad Quad(high Dose 65+) 02/27/2019  . Influenza Split 04/05/2011, 04/04/2012  . Influenza Whole 03/27/2008, 04/15/2009, 03/20/2010  . Influenza, High Dose Seasonal PF 03/31/2016, 04/06/2017, 03/29/2018  . Influenza,inj,Quad PF,6+ Mos 04/03/2013, 03/14/2015  . Pneumococcal Conjugate-13 03/14/2015  . Pneumococcal Polysaccharide-23 06/29/1995  . Td 11/13/2004  . Tdap 12/02/2016  . Zoster 06/28/2004  . Zoster Recombinat (Shingrix) 12/30/2017, 05/22/2018    Qualifies for Shingles Vaccine? Up to date   Screening Tests Health Maintenance  Topic Date Due  . FOOT EXAM  05/25/2019  . HEMOGLOBIN A1C  07/14/2019  . OPHTHALMOLOGY EXAM  03/26/2020  . TETANUS/TDAP  12/03/2026  . INFLUENZA VACCINE  Completed  . PNA vac Low Risk Adult  Completed   Cancer Screenings: Lung: Low Dose CT Chest recommended if Age 32-80 years, 30 pack-year currently smoking OR have quit w/in 15years. Patient does not qualify. Colorectal: Up to  Date   Additional Screenings: Hepatitis C Screening:Low Risk       Plan:   I have personally reviewed and noted the following in the patient's chart:   . Medical and social history . Use of alcohol, tobacco or illicit drugs  . Current medications and supplements . Functional ability and status . Nutritional status . Physical activity . Advanced directives . List of other physicians . Hospitalizations, surgeries, and ER visits in previous 12 months . Vitals . Screenings to include cognitive, depression, and  falls . Referrals and appointments  In  addition, I have reviewed and discussed with patient certain preventive protocols, quality metrics, and best practice recommendations. A written personalized care plan for preventive services as well as general preventive health recommendations were provided to patient.     Sandrea Hughs, NP  09/06/2019

## 2019-09-12 DIAGNOSIS — L723 Sebaceous cyst: Secondary | ICD-10-CM | POA: Diagnosis not present

## 2019-09-12 DIAGNOSIS — D225 Melanocytic nevi of trunk: Secondary | ICD-10-CM | POA: Diagnosis not present

## 2019-09-12 DIAGNOSIS — Z85828 Personal history of other malignant neoplasm of skin: Secondary | ICD-10-CM | POA: Diagnosis not present

## 2019-09-12 DIAGNOSIS — L570 Actinic keratosis: Secondary | ICD-10-CM | POA: Diagnosis not present

## 2019-09-12 DIAGNOSIS — L821 Other seborrheic keratosis: Secondary | ICD-10-CM | POA: Diagnosis not present

## 2019-09-18 ENCOUNTER — Other Ambulatory Visit: Payer: Self-pay | Admitting: Internal Medicine

## 2019-10-03 ENCOUNTER — Ambulatory Visit: Payer: Medicare Other | Admitting: Gastroenterology

## 2019-11-01 DIAGNOSIS — C61 Malignant neoplasm of prostate: Secondary | ICD-10-CM | POA: Diagnosis not present

## 2019-11-01 DIAGNOSIS — K5730 Diverticulosis of large intestine without perforation or abscess without bleeding: Secondary | ICD-10-CM | POA: Diagnosis not present

## 2019-11-01 DIAGNOSIS — E119 Type 2 diabetes mellitus without complications: Secondary | ICD-10-CM | POA: Diagnosis not present

## 2019-11-01 DIAGNOSIS — D649 Anemia, unspecified: Secondary | ICD-10-CM | POA: Diagnosis not present

## 2019-11-01 DIAGNOSIS — R0989 Other specified symptoms and signs involving the circulatory and respiratory systems: Secondary | ICD-10-CM | POA: Diagnosis not present

## 2019-11-01 DIAGNOSIS — I10 Essential (primary) hypertension: Secondary | ICD-10-CM | POA: Diagnosis not present

## 2019-11-01 LAB — CBC AND DIFFERENTIAL
HCT: 40 — AB (ref 41–53)
Hemoglobin: 13.2 — AB (ref 13.5–17.5)
Platelets: 119 — AB (ref 150–399)
WBC: 10.2

## 2019-11-01 LAB — BASIC METABOLIC PANEL
BUN: 40 — AB (ref 4–21)
CO2: 21 (ref 13–22)
Chloride: 107 (ref 99–108)
Creatinine: 1.9 — AB (ref 0.6–1.3)
Glucose: 105
Potassium: 4.5 (ref 3.4–5.3)
Sodium: 143 (ref 137–147)

## 2019-11-01 LAB — HEPATIC FUNCTION PANEL
ALT: 17 (ref 10–40)
AST: 22 (ref 14–40)

## 2019-11-01 LAB — COMPREHENSIVE METABOLIC PANEL
Albumin: 4.2 (ref 3.5–5.0)
Calcium: 8.8 (ref 8.7–10.7)
Globulin: 1.9

## 2019-11-01 LAB — CBC: RBC: 4.09 (ref 3.87–5.11)

## 2019-11-01 LAB — HEMOGLOBIN A1C: Hemoglobin A1C: 5.7

## 2019-11-06 ENCOUNTER — Encounter: Payer: Self-pay | Admitting: Internal Medicine

## 2019-11-14 ENCOUNTER — Encounter: Payer: Self-pay | Admitting: Internal Medicine

## 2019-11-14 ENCOUNTER — Non-Acute Institutional Stay: Payer: Medicare Other | Admitting: Internal Medicine

## 2019-11-14 ENCOUNTER — Other Ambulatory Visit: Payer: Self-pay

## 2019-11-14 VITALS — BP 130/62 | HR 56 | Temp 97.5°F | Ht 71.0 in | Wt 198.0 lb

## 2019-11-14 DIAGNOSIS — E039 Hypothyroidism, unspecified: Secondary | ICD-10-CM | POA: Diagnosis not present

## 2019-11-14 DIAGNOSIS — E118 Type 2 diabetes mellitus with unspecified complications: Secondary | ICD-10-CM

## 2019-11-14 DIAGNOSIS — N184 Chronic kidney disease, stage 4 (severe): Secondary | ICD-10-CM

## 2019-11-14 DIAGNOSIS — K5901 Slow transit constipation: Secondary | ICD-10-CM

## 2019-11-14 DIAGNOSIS — G3184 Mild cognitive impairment, so stated: Secondary | ICD-10-CM

## 2019-11-14 DIAGNOSIS — F325 Major depressive disorder, single episode, in full remission: Secondary | ICD-10-CM

## 2019-11-14 MED ORDER — FERROUS SULFATE 325 (65 FE) MG PO TBEC: 325.0000 mg | DELAYED_RELEASE_TABLET | ORAL | 3 refills | Status: AC

## 2019-11-14 NOTE — Progress Notes (Signed)
Location:   [place] of Service:   Clinic  Provider: Geroldine Esquivias L. Mariea Clonts, D.O., C.M.D.  Code Status: DNR Goals of Care:  Advanced Directives 11/14/2019  Does Patient Have a Medical Advance Directive? Yes  Type of Advance Directive Out of facility DNR (pink MOST or yellow form);Living will  Does patient want to make changes to medical advance directive? No - Patient declined  Copy of [place] in Chart? -  Would patient like information on creating a medical advance directive? -     Chief Complaint  Patient presents with  . Medical Management of Chronic Issues    6 month check up    HPI: Patient is a 86 y.o. male seen today for medical management of chronic diseases.    He has been having to push really hard to have bms.  Sees GI later this week about it.  He is taking fibercon daily.  His wife has taken fiber--2 teaspoons and half glass of water and small prune juice, eats high fiber diet, but she say he does not hydrate well and does not eat salads.  (He says he does eat a salad nearly every day, but admits to poor hydration.)  He also has CKD.  Discussed hydration again with him and written instruction provided.    [name] asks him how his memory is--he says sometimes good and sometimes it's nonexistent.    He is wanting to come off the buproprion 100mg  bid.  He feels like he was not settled at first.  [name] feels like his mood is better more of the time.  We will not resume this.    Sleeping well.  Appetite is good now.    [name] reports he is having more difficulty with his memory and getting lost around town.  She talked to their attorney and he had stories about others who continued to drive.  [name] told him she did not want him to drive.  He has agreed not to drive.  He's not happy about it, but he has not brought it up again.    Past Medical History:  Diagnosis Date  . Anxiety   . BRADYCARDIA 11/13/2008  . Cataract   . Cholelithiasis   .  COLONIC POLYPS, HX OF 02/04/2007  . DEPRESSION 02/04/2007  . DIABETES MELLITUS, TYPE II 02/04/2007  . DIVERTICULOSIS, COLON 02/04/2007  . FOREIGN BODY, ASPIRATION 12/04/2007  . GERD (gastroesophageal reflux disease)   . GLAUCOMA 03/08/2009  . Hepatitis A 1963  . HYPERLIPIDEMIA 12/04/2007  . HYPERTENSION 02/04/2007  . Memory loss   . OBSTRUCTIVE SLEEP APNEA 11/05/2009   -PSG 01/05/10 RDI 11, PLMI 56  . OSTEOARTHRITIS 02/04/2007  . Peptic stricture of esophagus   . PROSTATE CANCER 1997  . RENAL INSUFFICIENCY 11/13/2008  . SYNCOPE 12/31/2009  . THROMBOCYTOPENIA 04/17/2008  . THYROID NODULE, RIGHT 11/13/2008  . UNSPECIFIED ANEMIA 11/07/2007    Past Surgical History:  Procedure Laterality Date  . Heart Cartherization  2000  . PROSTATECTOMY  1997  . TONSILLECTOMY  1940's    Allergies  Allergen Reactions  . Lactose Intolerance (Gi)   . Sulfonamide Derivatives     REACTION: Itch/Red Spots    Outpatient Encounter Medications as of 11/14/2019  Medication Sig  . Cholecalciferol (VITAMIN D) 2000 UNITS CAPS Take 2 capsules by mouth daily.   . ferrous sulfate 325 (65 FE) MG EC tablet Take 1 tablet (325 mg total) by mouth every other day.  . levothyroxine (SYNTHROID)  25 MCG tablet TAKE 1 TABLET IN THE MORNING BEFORE BREAKFAST.  Marland Kitchen lisinopril (ZESTRIL) 5 MG tablet TAKE ONE TABLET BY MOUTH ONE TIME DAILY   . rosuvastatin (CRESTOR) 40 MG tablet TAKE ONE TABLET BY MOUTH ONE TIME DAILY  . traZODone (DESYREL) 100 MG tablet Take 1 tablet (100 mg total) by mouth at bedtime.  . [DISCONTINUED] ferrous sulfate 325 (65 FE) MG EC tablet Take 325 mg by mouth daily.  . [DISCONTINUED] buPROPion (WELLBUTRIN) 100 MG tablet Take 1 tablet (100 mg total) by mouth 2 (two) times daily.   No facility-administered encounter medications on file as of 11/14/2019.    Review of Systems:  Review of Systems  Constitutional: Negative for chills, fever and malaise/fatigue.  HENT: Positive for hearing loss.   Eyes: Negative for blurred  vision.  Respiratory: Negative for cough and shortness of breath.   Cardiovascular: Positive for leg swelling. Negative for chest pain and palpitations.       As day goes on  Gastrointestinal: Positive for constipation. Negative for abdominal pain, blood in stool, diarrhea and melena.  Genitourinary: Negative for dysuria.  Musculoskeletal: Negative for falls and joint pain.  Skin: Negative for itching and rash.  Neurological: Negative for dizziness and loss of consciousness.  Endo/Heme/Allergies: Does not bruise/bleed easily.  Psychiatric/Behavioral: Positive for memory loss. Negative for depression. The patient is not nervous/anxious and does not have insomnia.     Health Maintenance  Topic Date Due  . FOOT EXAM  06/30/2020 (Originally 05/25/2019)  . INFLUENZA VACCINE  01/27/2020  . OPHTHALMOLOGY EXAM  03/26/2020  . HEMOGLOBIN A1C  05/03/2020  . TETANUS/TDAP  12/03/2026  . COVID-19 Vaccine  Completed  . PNA vac Low Risk Adult  Completed    Physical Exam: Vitals:   11/14/19 1057  BP: 130/62  Pulse: (!) 56  Temp: (!) 97.5 F (36.4 C)  TempSrc: Temporal  SpO2: 96%  Weight: 198 lb (89.8 kg)  Height: 5\' 11"  (1.803 m)   Body mass index is 27.62 kg/m. Physical Exam Vitals reviewed.  Constitutional:      General: He is not in acute distress.    Appearance: Normal appearance. He is not toxic-appearing.  HENT:     Head: Normocephalic and atraumatic.     Ears:     Comments: HOH, hearing aids Cardiovascular:     Rate and Rhythm: Normal rate and regular rhythm.     Pulses: Normal pulses.     Heart sounds: Normal heart sounds.  Pulmonary:     Effort: Pulmonary effort is normal.     Breath sounds: Normal breath sounds. No wheezing, rhonchi or rales.  Abdominal:     General: Bowel sounds are normal. There is no distension.     Palpations: Abdomen is soft.     Tenderness: There is no abdominal tenderness. There is no guarding or rebound.  Musculoskeletal:        General:  Normal range of motion.     Right lower leg: Edema present.     Left lower leg: Edema present.  Skin:    General: Skin is warm and dry.     Capillary Refill: Capillary refill takes less than 2 seconds.  Neurological:     General: No focal deficit present.     Mental Status: He is alert and oriented to person, place, and time. Mental status is at baseline.     Cranial Nerves: No cranial nerve deficit.     Motor: No weakness.  Gait: Gait normal.     Comments: Does have short-term memory loss and wife corrects what he tells me  Psychiatric:        Mood and Affect: Mood normal.        Behavior: Behavior normal.     Labs reviewed: Basic Metabolic Panel: Recent Labs    01/11/19 0000 01/11/19 0200 03/13/19 0000 07/21/19 0000 07/23/19 0000 08/02/19 0000 11/01/19 0700  NA   < >  --   --   --  132* 139 143  K   < >  --   --   --  4.5 4.9 4.5  CL  --   --   --   --  99 104 107  CO2  --   --   --   --  21 24* 21  BUN   < >  --   --   --  34* 30* 40*  CREATININE   < >  --   --   --  1.9* 2.0* 1.9*  CALCIUM  --   --   --  7.9*  --  8.9 8.8  TSH  --  5.42 4.42  --   --   --   --    < > = values in this interval not displayed.   Liver Function Tests: Recent Labs    01/11/19 0200 11/01/19 0700  AST 19 22  ALT 15 17  ALBUMIN  --  4.2   No results for input(s): LIPASE, AMYLASE in the last 8760 hours. No results for input(s): AMMONIA in the last 8760 hours. CBC: Recent Labs    01/22/19 1555 07/21/19 0000 11/01/19 0700  WBC 11.7* 9.0 10.2  NEUTROABS 6,494 4  --   HGB 12.0* 12.4* 13.2*  HCT 35.4* 36* 40*  MCV 94.7  --   --   PLT 148 87* 119*   Lipid Panel: Recent Labs    01/11/19 0000  CHOL 137  HDL 36  LDLCALC 76  TRIG 121   Lab Results  Component Value Date   HGBA1C 5.7 11/01/2019   Assessment/Plan 1. Slow transit constipation -bowels are moving regularly, but hard with fibercon supplement -he's not drinking even 6 glasses of water per day--recommended  at least 48 oz of water per day (plus other beverages he likes) -stay active--they do walk around [place] and he had been going to a gym pre-covid -he's also been on iron daily and anemia improved so changed to qod  2. MCI (mild cognitive impairment) with memory loss -is progressing gradually and [name] must remind him of things and he is no longer driving due to concerns about getting lost   3. Controlled type 2 diabetes mellitus with complication, without long-term current use of insulin ([place]) -well-controlled, cont to work on diet and exercise, cont crestor and ace  4. Chronic kidney disease (CKD), stage IV (severe) (HCC) -Avoid nephrotoxic agents like nsaids, dose adjust renally excreted meds, hydrate.  5. Hypothyroidism, unspecified type Lab Results  Component Value Date   TSH 4.42 03/13/2019  -at goal on current levothyroxine, cont same dose and monitor  6. Major depression in remission ([place]) -seems improved -sleeps well with trazodone and has been out of wellbutrin so we agreed to stop it (was really to help him when he was irritable with his wife at her request)  Labs/tests ordered:  Cbc, bmp, tsh, b12 before Next appt:  03/19/2020   Wannetta Langland L. Tavonte Seybold, D.O. [facility] [address], [place] [address] Cell Phone (Mon-Fri 8am-5pm):  [phone] On Call:  [phone] & follow prompts after 5pm & weekends Office Phone:  [phone] Office Fax:  [phone]

## 2019-11-14 NOTE — Patient Instructions (Addendum)
I want you to drink at least 6 8oz glasses of water daily--this will help constipation and your kidney function.    Decrease iron to every other day because of your constipation.

## 2019-11-16 ENCOUNTER — Ambulatory Visit (INDEPENDENT_AMBULATORY_CARE_PROVIDER_SITE_OTHER): Payer: Medicare Other | Admitting: Physician Assistant

## 2019-11-16 ENCOUNTER — Encounter: Payer: Self-pay | Admitting: Physician Assistant

## 2019-11-16 VITALS — BP 120/60 | HR 56 | Ht 71.0 in | Wt 199.0 lb

## 2019-11-16 DIAGNOSIS — K5900 Constipation, unspecified: Secondary | ICD-10-CM

## 2019-11-16 NOTE — Patient Instructions (Signed)
If you are age 84 or older, your body mass index should be between 23-30. Your Body mass index is 27.75 kg/m. If this is out of the aforementioned range listed, please consider follow up with your Primary Care Provider.  If you are age 29 or younger, your body mass index should be between 19-25. Your Body mass index is 27.75 kg/m. If this is out of the aformentioned range listed, please consider follow up with your Primary Care Provider.   Take Miralax 1 capful mixed in 8 ounces of water in the morning for constipation as tolerated. Take one Fibercon daily Try eating prunes, figs or apricots 2 daily Increase your water intake.  Due to recent changes in healthcare laws, you may see the results of your imaging and laboratory studies on MyChart before your provider has had a chance to review them.  We understand that in some cases there may be results that are confusing or concerning to you. Not all laboratory results come back in the same time frame and the provider may be waiting for multiple results in order to interpret others.  Please give [place] 48 hours in order for your provider to thoroughly review all the results before contacting the office for clarification of your results.   Follow up as needed.

## 2019-11-16 NOTE — Progress Notes (Signed)
Subjective:    Patient ID: [name], male    DOB: 1927/07/24, 86 y.o.   MRN: [id number]  HPI [name] is a pleasant 86 year old white male, established with Dr. Rush Landmark, who comes in today with complaints of constipation.  He was last seen in the office in January 2020, and at that time was doing well with use of FiberCon 1 to 2/day. He had undergone colonoscopy in December 2019 with finding of an extremely tortuous colon and tight sigmoid with an area of stenosis.  Was noted to have multiple small and large mouth diverticuli.  Patient has history of hypertension, sleep apnea, chronic kidney disease stage IV, cholelithiasis, history of prostate CA for which she underwent prostatectomy, hypothyroidism, adult onset diabetes mellitus, and mild cognitive impairment. He had recent labs on 11/01/2019 which were reviewed, creatinine 1.9, hemoglobin 13.2/hematocrit of 40/platelets 118, LFTs within normal limits. He says he has been feeling good but has been dealing with some constipation over the past few years.  He says he was instructed by Dr. Stefani Dama Roddy not to push or strain for bowel movements but says sometimes he has to.  He is times going for 2 to 3 days without a bowel movement and says when that happens it works on him mentally.  He is continue taking 1 FiberCon daily in the morning.  He admits that he does not drink enough water though usually taking in about 3 small glasses per day.  His wife who accompanies him says she has been pushing him on water intake and also has asked him to try prune juice which she does not like. He denies any abdominal pain or discomfort, appetite has been good, weight has been stable.  Review of Systems Pertinent positive and negative review of systems were noted in the above HPI section.  All other review of systems was otherwise negative.  Outpatient Encounter Medications as of 11/16/2019  Medication Sig  . Cholecalciferol (VITAMIN D) 2000 UNITS CAPS Take 2  capsules by mouth daily.   . ferrous sulfate 325 (65 FE) MG EC tablet Take 1 tablet (325 mg total) by mouth every other day.  . levothyroxine (SYNTHROID) 25 MCG tablet TAKE 1 TABLET IN THE MORNING BEFORE BREAKFAST.  Marland Kitchen lisinopril (ZESTRIL) 5 MG tablet TAKE ONE TABLET BY MOUTH ONE TIME DAILY   . rosuvastatin (CRESTOR) 40 MG tablet TAKE ONE TABLET BY MOUTH ONE TIME DAILY  . traZODone (DESYREL) 100 MG tablet Take 1 tablet (100 mg total) by mouth at bedtime.   No facility-administered encounter medications on file as of 11/16/2019.   Allergies  Allergen Reactions  . Lactose Intolerance (Gi)   . Sulfonamide Derivatives     REACTION: Itch/Red Spots   Patient Active Problem List   Diagnosis Date Noted  . Chronic kidney disease (CKD), stage III (moderate) 07/24/2019  . Relationship dysfunction 07/08/2018  . MCI (mild cognitive impairment) with memory loss 07/08/2018  . Driving safety issue 05/24/2018  . Change in bowel habits 04/02/2018  . Fecal urgency 04/02/2018  . Flatus 04/02/2018  . Mild cognitive impairment 03/07/2018  . Controlled type 2 diabetes mellitus with both eyes affected by mild nonproliferative retinopathy without macular edema, with long-term current use of insulin ([place]) 01/24/2018  . Depression, major, single episode, mild ([place]) 01/24/2018  . Memory loss 12/21/2017  . Constipation 09/09/2017  . UTI (urinary tract infection) 02/25/2017  . Hypothyroidism 12/10/2015  . Cervical neck pain with evidence of disc disease 11/12/2015  . Tendinopathy of  left rotator cuff 04/16/2015  . Hamstring muscle strain 04/16/2015  . Abdominal pain, unspecified site 03/12/2014  . Diabetes mellitus with renal manifestation ([place]) 11/17/2012  . Bicipital tendinitis of right shoulder 11/08/2012  . Paresthesia 04/04/2012  . Heel pain 11/30/2011  . Insomnia 01/20/2011  . Routine general medical examination at a health care facility 11/07/2010  . Encounter for long-term (current) use of other  medications 10/15/2010  . EDEMA 04/08/2010  . OSTEOARTHRITIS, HIP 03/05/2010  . HIP PAIN, RIGHT 03/05/2010  . UNEQUAL LEG LENGTH 03/05/2010  . LEG CRAMPS 02/17/2010  . SYNCOPE 12/31/2009  . CAROTID BRUIT, LEFT 12/31/2009  . OBSTRUCTIVE SLEEP APNEA 11/05/2009  . GLAUCOMA 03/08/2009  . THYROID NODULE, RIGHT 11/13/2008  . Bradycardia 11/13/2008  . Disorder resulting from impaired renal function 11/13/2008  . Thrombocytopenia ([place]) 04/17/2008  . Dyslipidemia 12/04/2007  . FOREIGN BODY, ASPIRATION 12/04/2007  . PROSTATE CANCER 11/07/2007  . DEPRESSION 02/04/2007  . HTN (hypertension) 02/04/2007  . DIVERTICULOSIS, COLON 02/04/2007  . OSTEOARTHRITIS 02/04/2007  . COLONIC POLYPS, HX OF 02/04/2007   Social History   Socioeconomic History  . Marital status: Married    Spouse name: Not on file  . Number of children: 3  . Years of education: college  . Highest education level: Bachelor's degree (e.g., BA, AB, BS)  Occupational History  . Occupation: Immunologist    Comment: Retired  Tobacco Use  . Smoking status: Former Smoker    Types: Cigarettes, Pipe    Quit date: 07/08/1993    Years since quitting: 26.3  . Smokeless tobacco: Never Used  Substance and Sexual Activity  . Alcohol use: Yes    Comment: minimal  . Drug use: No  . Sexual activity: Not on file  Other Topics Concern  . Not on file  Social History Narrative   He played tennis 3x a week before moving to [place].   Right-handed.   2 cups caffeine per day.      Social Determinants of Health   Financial Resource Strain:   . Difficulty of Paying Living Expenses:   Food Insecurity:   . Worried About Charity fundraiser in the Last Year:   . Arboriculturist in the Last Year:   Transportation Needs:   . Film/video editor (Medical):   Marland Kitchen Lack of Transportation (Non-Medical):   Physical Activity:   . Days of Exercise per Week:   . Minutes of Exercise per Session:   Stress:   .  Feeling of Stress :   Social Connections:   . Frequency of Communication with Friends and Family:   . Frequency of Social Gatherings with Friends and Family:   . Attends Religious Services:   . Active Member of Clubs or Organizations:   . Attends Archivist Meetings:   Marland Kitchen Marital Status:   Intimate Partner Violence:   . Fear of Current or Ex-Partner:   . Emotionally Abused:   Marland Kitchen Physically Abused:   . Sexually Abused:     [name] family history includes Kidney disease in his father; Other in his mother.      Objective:    Vitals:   11/16/19 1525  BP: 120/60  Pulse: (!) 56    Physical Exam Well-developed well-nourished  Pleasant [place] WM  in no acute distress.  Height, Weight 199 , BMI 27.5  HEENT; nontraumatic normocephalic, EOMI, PER R LA, sclera anicteric. Oropharynx; not examined Neck; supple, no JVD Cardiovascular; regular rate and rhythm with  S1-S2, no murmur rub or gallop Pulmonary; Clear bilaterally Abdomen; soft, nontender, nondistended, no palpable mass or hepatosplenomegaly, bowel sounds are active midline ventral hernia. soft Rectal; not done Skin; benign exam, no jaundice rash or appreciable lesions Extremities; no clubbing cyanosis or edema skin warm and dry Neuro/Psych; alert and oriented x4, grossly nonfocal mood and affect appropriate       Assessment & Plan:   #38 86 year old white male with mild chronic constipation and known history of tortuous colon and area of stenosis in the left colon as well as multiple diverticuli. #2 hypertension 3.  Adult onset diabetes mellitus 4.  History of prostate CA status post prostatectomy 5.  Chronic kidney disease stage IV 6.  Cholelithiasis 7.  Mild cognitive impairment  Plan; continue FiberCon 1/day. Add trial of MiraLAX 17 g in 8 ounces of water every day or every other day on a scheduled basis. Also discussed increasing water intake to at least 60 ounces per day. He can try a glass of prune  juice daily, or dried prunes fix or apricots. He will follow up with Dr. Rush Landmark or myself on an as-needed basis.  Gerald Bayle S Tomie Spizzirri PA-C 11/16/2019   Cc: Gayland Curry, DO

## 2019-11-18 NOTE — Progress Notes (Signed)
Attending Physician's Attestation   I have reviewed the chart.   I agree with the Advanced Practitioner's note, impression, and recommendations with any updates as below.    Olivea Sonnen Mansouraty, MD [facility] Advanced Endoscopy Office # [phone]  

## 2019-11-21 ENCOUNTER — Telehealth: Payer: Self-pay | Admitting: Physician Assistant

## 2019-11-21 NOTE — Telephone Encounter (Signed)
No answer. Voicemail answers. Message left.

## 2019-11-22 NOTE — Telephone Encounter (Signed)
Left a message on the voicemail inviting a return call if there are questions or concerns.

## 2019-12-07 ENCOUNTER — Other Ambulatory Visit: Payer: Self-pay | Admitting: Internal Medicine

## 2019-12-21 ENCOUNTER — Ambulatory Visit (INDEPENDENT_AMBULATORY_CARE_PROVIDER_SITE_OTHER): Payer: Medicare Other | Admitting: Gastroenterology

## 2019-12-21 ENCOUNTER — Encounter: Payer: Self-pay | Admitting: Gastroenterology

## 2019-12-21 VITALS — BP 110/70 | HR 56 | Ht 71.0 in | Wt 197.0 lb

## 2019-12-21 DIAGNOSIS — R933 Abnormal findings on diagnostic imaging of other parts of digestive tract: Secondary | ICD-10-CM | POA: Diagnosis not present

## 2019-12-21 DIAGNOSIS — K5909 Other constipation: Secondary | ICD-10-CM

## 2019-12-21 NOTE — Patient Instructions (Signed)
Add fiber con once daily.   Add Miralax 1-2 capfuls daily.   64-80 ounces of fluid per day.   Search for  [employer] bottle- try [employer]   If you are age 84 or older, your body mass index should be between 23-30. Your Body mass index is 27.48 kg/m. If this is out of the aforementioned range listed, please consider follow up with your Primary Care Provider.  If you are age 29 or younger, your body mass index should be between 19-25. Your Body mass index is 27.48 kg/m. If this is out of the aformentioned range listed, please consider follow up with your Primary Care Provider.    Thank you for choosing me and [facility].  Dr. Rush Landmark

## 2019-12-23 ENCOUNTER — Encounter: Payer: Self-pay | Admitting: Gastroenterology

## 2019-12-23 DIAGNOSIS — R933 Abnormal findings on diagnostic imaging of other parts of digestive tract: Secondary | ICD-10-CM | POA: Insufficient documentation

## 2019-12-23 NOTE — Progress Notes (Signed)
[place] VISIT   Primary Care Provider Gayland Curry, DO [address] [place] [address] [phone]  Patient Profile: [name] is a 86 y.o. male with a pmh significant for Diverticulosis, Prior Colon Polyps, incomplete colonoscopy, HTN, HLD, Cholelithiasis, MDD/Anxiety, DM, GERD, Prostate Cancer, OSA, Glaucoma, CRI, Self-Reported Lactose Intolerance.  The patient presents to the [facility] for an evaluation and management of problem(s) noted below:  Problem List 1. Chronic constipation   2. Abnormal colonoscopy     History of Present Illness: Please see prior notes from myself as well as most recent note by PA [place] for full details of HPI.  Interval History: Patient returns for scheduled follow-up.  He is accompanied by his wife.  When he takes the MiraLAX on a scheduled basis he has improved bowel movements/bowel habits.  If he does not take the MiraLAX he is chronic constipation persists.  He does not drink enough fluids as he is only drinking 32 to 40 ounces of fluids per day.  Wife describes her regimen of fiber supplementation as well as prune juice.  Reiterates that patient is not great at taking his laxative therapy.  No blood in the stools.  We rediscussed the results of the colonoscopy attempt in 2019.  He denies any hematochezia.  He is straining to have a bowel movement at times.  GI Review of Systems Positive as above Negative for nausea, vomiting, dysphagia, pyrosis, melena  Review of Systems General: Denies fevers/chills Cardiovascular: Denies chest pain Pulmonary: Denies shortness of breath Gastroenterological: See HPI Dermatological: Denies jaundice Psychological: Mood is stable   Medications Current Outpatient Medications  Medication Sig Dispense Refill  . Cholecalciferol (VITAMIN D) 2000 UNITS CAPS Take 2 capsules by mouth daily.     . ferrous sulfate 325 (65 FE) MG EC tablet Take 1  tablet (325 mg total) by mouth every other day. 15 tablet 3  . levothyroxine (SYNTHROID) 25 MCG tablet TAKE 1 TABLET IN THE MORNING BEFORE BREAKFAST. 90 tablet 0  . lisinopril (ZESTRIL) 5 MG tablet TAKE ONE TABLET BY MOUTH ONE TIME DAILY  90 tablet 3  . polycarbophil (FIBERCON) 625 MG tablet Take 625 mg by mouth daily.    . polyethylene glycol (MIRALAX / GLYCOLAX) 17 g packet Take 17 g by mouth daily. Take 1 to 2 capfuls daily at noon    . rosuvastatin (CRESTOR) 40 MG tablet TAKE 1 TABLET EACH DAY. 90 tablet 0  . traZODone (DESYREL) 100 MG tablet Take 1 tablet (100 mg total) by mouth at bedtime. 90 tablet 1   No current facility-administered medications for this visit.    Allergies Allergies  Allergen Reactions  . Lactose Intolerance (Gi)   . Sulfonamide Derivatives     REACTION: Itch/Red Spots    Histories Past Medical History:  Diagnosis Date  . Anxiety   . BRADYCARDIA 11/13/2008  . Cataract   . Cholelithiasis   . COLONIC POLYPS, HX OF 02/04/2007  . DEPRESSION 02/04/2007  . DIABETES MELLITUS, TYPE II 02/04/2007  . DIVERTICULOSIS, COLON 02/04/2007  . FOREIGN BODY, ASPIRATION 12/04/2007  . GERD (gastroesophageal reflux disease)   . GLAUCOMA 03/08/2009  . Hepatitis A 1963  . HYPERLIPIDEMIA 12/04/2007  . HYPERTENSION 02/04/2007  . Memory loss   . OBSTRUCTIVE SLEEP APNEA 11/05/2009   -PSG 01/05/10 RDI 11, PLMI 56  . OSTEOARTHRITIS 02/04/2007  . Peptic stricture of esophagus   . PROSTATE CANCER 1997  . RENAL INSUFFICIENCY 11/13/2008  . SYNCOPE 12/31/2009  .  THROMBOCYTOPENIA 04/17/2008  . THYROID NODULE, RIGHT 11/13/2008  . UNSPECIFIED ANEMIA 11/07/2007   Past Surgical History:  Procedure Laterality Date  . Heart Cartherization  2000  . PROSTATECTOMY  1997  . TONSILLECTOMY  1940's   Social History   Socioeconomic History  . Marital status: Married    Spouse name: [name]  . Number of children: 3  . Years of education: college  . Highest education level: Bachelor's degree (e.g., BA, AB,  BS)  Occupational History  . Occupation: Immunologist    Comment: Retired  Tobacco Use  . Smoking status: Former Smoker    Types: Cigarettes, Pipe    Quit date: 07/08/1993    Years since quitting: 26.4  . Smokeless tobacco: Never Used  Vaping Use  . Vaping Use: Never used  Substance and Sexual Activity  . Alcohol use: Yes    Comment: minimal  . Drug use: No  . Sexual activity: Not on file  Other Topics Concern  . Not on file  Social History Narrative   He played tennis 3x a week before moving to [place].   Right-handed.   2 cups caffeine per day.      Social Determinants of Health   Financial Resource Strain:   . Difficulty of Paying Living Expenses:   Food Insecurity:   . Worried About Charity fundraiser in the Last Year:   . Arboriculturist in the Last Year:   Transportation Needs:   . Film/video editor (Medical):   Marland Kitchen Lack of Transportation (Non-Medical):   Physical Activity:   . Days of Exercise per Week:   . Minutes of Exercise per Session:   Stress:   . Feeling of Stress :   Social Connections:   . Frequency of Communication with Friends and Family:   . Frequency of Social Gatherings with Friends and Family:   . Attends Religious Services:   . Active Member of Clubs or Organizations:   . Attends Archivist Meetings:   Marland Kitchen Marital Status:   Intimate Partner Violence:   . Fear of Current or Ex-Partner:   . Emotionally Abused:   Marland Kitchen Physically Abused:   . Sexually Abused:    Family History  Problem Relation Age of Onset  . Kidney disease Father        Bright's Disease - died at age 27  . Other Mother        no signifincant health problems - died at age 61  . Heart disease Neg Hx        No FH of Coronary Artery Disease  . Cancer Neg Hx   . Colon cancer Neg Hx   . Esophageal cancer Neg Hx   . Inflammatory bowel disease Neg Hx   . Liver disease Neg Hx   . Pancreatic cancer Neg Hx   . Rectal cancer Neg Hx   .  Stomach cancer Neg Hx    I have reviewed his medical, social, and family history in detail and updated the electronic medical record as necessary.    PHYSICAL EXAMINATION  BP 110/70   Pulse (!) 56   Ht 5\' 11"  (1.803 m)   Wt 197 lb (89.4 kg)   BMI 27.48 kg/m  Wt Readings from Last 3 Encounters:  12/21/19 197 lb (89.4 kg)  11/16/19 199 lb (90.3 kg)  11/14/19 198 lb (89.8 kg)  GEN: NAD, appears stated age, doesn't appear chronically ill, accompanied by wife PSYCH: Cooperative, without pressured  speech EYE: Conjunctivae pink, sclerae anicteric ENT: MMM CV: Nontachycardic RESP: No audible wheezing GI: NABS, soft, NT/ND, without rebound MSK/EXT: No lower extremity edema SKIN: No jaundice NEURO:  Alert & Oriented x 3, no focal deficits   REVIEW OF DATA  I reviewed the following data at the time of this encounter:  GI Procedures and Studies  Read reviewed the December 2019 colonoscopy - Non-thrombosed internal hemorrhoids found on digital rectal exam. - Diverticulosis in the recto-sigmoid colon, in the sigmoid colon, in the descending colon and in the transverse colon. This led to restriction of the colon and difficulty with passage of the colonoscope and the pediatric colonoscope. Only traversed this stenosis/region with the adult endoscope. - Incidental mucosal tear in the descending colon in region of a diverticula, this is near the difficult to pass region.. - Normal mucosa in the entire examined colon otherwise. Biopsied to rule out Microscopic colitis. Separate jar of rectal biopsies to rule out proctitis. - Non-bleeding non-thrombosed internal hemorrhoids.  Laboratory Studies  Reviewed in epic  Imaging Studies  No new studies to review   ASSESSMENT  Mr. Splawn is a 86 y.o. male with a pmh significant for Diverticulosis, Prior Colon Polyps, incomplete colonoscopy, HTN, HLD, Cholelithiasis, MDD/Anxiety, DM, GERD, Prostate Cancer, OSA, Glaucoma, CRI, Self-Reported Lactose  Intolerance.  The patient is seen today for evaluation and management of:  1. Chronic constipation   2. Abnormal colonoscopy    The patient is hemodynamically stable.  Clinically, he has his chronic constipation.  I have hypothesized whether he has diverticular associated issues since my prior issue of incomplete colonoscopy due to significant narrowing in the sigmoid colon.  He has done well however over the course of the majority of the last year and a half.  With it being said, when he takes his laxative therapy he does better.  He will maintain daily FiberCon.  He will reinitiate MiraLAX and be vigilant with taking it once to twice daily.  We will increase his fluids to have at least 64 to 80 ounces of fluids per day.  We went over toileting technique and that will be given to the patient as well and patient instructions.  If the patient's symptoms persist then I would consider the role of cross-sectional imaging of the abdomen/pelvis.  Since we could not complete his last colonoscopy due to narrowing, I think I would not put him through another attempt unless imaging findings were concerning versus continued symptoms.  If that were the case, he would have to be done in the hospital-based setting with CO2 and with the ultraslim colonoscope and the neonatal endoscope being available for further evaluation.  All patient questions were answered, to the best of my ability, and the patient agrees to the aforementioned plan of action with follow-up as indicated.   PLAN  Continue FiberCon once daily MiraLAX once to twice daily Increase fluid intake to 64-80 ounces of fluid per day at minimum If patient has persistent symptoms then will consider a CT abdomen/pelvis with IV and oral contrast Any repeat attempted colonoscopy will need to be done in the hospital-based setting with CO2 and with ultrasound colonoscope and pediatric endoscope availability    No orders of the defined types were placed in this  encounter.   New Prescriptions   No medications on file   Modified Medications   No medications on file    Planned Follow Up: No follow-ups on file.   Total Time in Face-to-Face and in Coordination of  Care for patient including independent/personal interpretation/review of prior testing, medical history, examination, medication adjustment, communicating results with the patient directly, and documentation with the EHR is 25 minutes.   Justice Britain, MD [facility] Office # [phone]

## 2020-01-21 ENCOUNTER — Other Ambulatory Visit: Payer: Self-pay | Admitting: Internal Medicine

## 2020-01-21 DIAGNOSIS — F320 Major depressive disorder, single episode, mild: Secondary | ICD-10-CM

## 2020-02-10 ENCOUNTER — Other Ambulatory Visit: Payer: Self-pay | Admitting: Internal Medicine

## 2020-02-17 ENCOUNTER — Other Ambulatory Visit: Payer: Self-pay | Admitting: Internal Medicine

## 2020-02-17 DIAGNOSIS — F320 Major depressive disorder, single episode, mild: Secondary | ICD-10-CM

## 2020-03-06 ENCOUNTER — Other Ambulatory Visit: Payer: Self-pay | Admitting: Internal Medicine

## 2020-03-11 DIAGNOSIS — D519 Vitamin B12 deficiency anemia, unspecified: Secondary | ICD-10-CM | POA: Diagnosis not present

## 2020-03-11 DIAGNOSIS — G3184 Mild cognitive impairment, so stated: Secondary | ICD-10-CM | POA: Diagnosis not present

## 2020-03-11 DIAGNOSIS — K5901 Slow transit constipation: Secondary | ICD-10-CM | POA: Diagnosis not present

## 2020-03-11 DIAGNOSIS — E1129 Type 2 diabetes mellitus with other diabetic kidney complication: Secondary | ICD-10-CM | POA: Diagnosis not present

## 2020-03-11 DIAGNOSIS — E039 Hypothyroidism, unspecified: Secondary | ICD-10-CM | POA: Diagnosis not present

## 2020-03-11 LAB — CBC AND DIFFERENTIAL
HCT: 39 — AB (ref 41–53)
Hemoglobin: 13 — AB (ref 13.5–17.5)
Platelets: 139 — AB (ref 150–399)
WBC: 8.9

## 2020-03-11 LAB — BASIC METABOLIC PANEL WITH GFR
BUN: 31 — AB (ref 4–21)
CO2: 23 — AB (ref 13–22)
Chloride: 106 (ref 99–108)
Creatinine: 1.8 — AB (ref 0.6–1.3)
Glucose: 107
Glucose: 107
Potassium: 4.8 (ref 3.4–5.3)
Sodium: 141 (ref 137–147)

## 2020-03-11 LAB — HEPATIC FUNCTION PANEL
ALT: 15 (ref 10–40)
AST: 21 (ref 14–40)

## 2020-03-11 LAB — CBC: RBC: 4.02 (ref 3.87–5.11)

## 2020-03-11 LAB — COMPREHENSIVE METABOLIC PANEL
Albumin: 3.9 (ref 3.5–5.0)
Calcium: 9.3 (ref 8.7–10.7)
Globulin: 2.2

## 2020-03-11 LAB — HEMOGLOBIN A1C: Hemoglobin A1C: 6.4

## 2020-03-11 LAB — TSH: TSH: 5.32 (ref 0.41–5.90)

## 2020-03-11 LAB — VITAMIN B12: Vitamin B-12: 611

## 2020-03-18 ENCOUNTER — Other Ambulatory Visit: Payer: Self-pay | Admitting: Internal Medicine

## 2020-03-18 DIAGNOSIS — F320 Major depressive disorder, single episode, mild: Secondary | ICD-10-CM

## 2020-03-19 ENCOUNTER — Encounter: Payer: Self-pay | Admitting: Internal Medicine

## 2020-03-19 ENCOUNTER — Non-Acute Institutional Stay: Payer: Medicare Other | Admitting: Internal Medicine

## 2020-03-19 ENCOUNTER — Other Ambulatory Visit: Payer: Self-pay

## 2020-03-19 VITALS — BP 110/74 | HR 62 | Temp 97.9°F | Ht 71.0 in | Wt 198.4 lb

## 2020-03-19 DIAGNOSIS — Z66 Do not resuscitate: Secondary | ICD-10-CM

## 2020-03-19 DIAGNOSIS — E118 Type 2 diabetes mellitus with unspecified complications: Secondary | ICD-10-CM | POA: Diagnosis not present

## 2020-03-19 DIAGNOSIS — F325 Major depressive disorder, single episode, in full remission: Secondary | ICD-10-CM

## 2020-03-19 DIAGNOSIS — G3184 Mild cognitive impairment, so stated: Secondary | ICD-10-CM | POA: Diagnosis not present

## 2020-03-19 DIAGNOSIS — N184 Chronic kidney disease, stage 4 (severe): Secondary | ICD-10-CM | POA: Diagnosis not present

## 2020-03-19 DIAGNOSIS — E039 Hypothyroidism, unspecified: Secondary | ICD-10-CM

## 2020-03-19 NOTE — Progress Notes (Signed)
Location:  Occupational psychologist of Service:  Clinic (12)  Provider: Mareon Robinette L. Mariea Clonts, D.O., C.M.D.  Code Status: DNR Goals of Care:  Advanced Directives 03/19/2020  Does Patient Have a Medical Advance Directive? Yes  Type of Advance Directive Out of facility DNR (pink MOST or yellow form);Living will  Does patient want to make changes to medical advance directive? No - Patient declined  Copy of [place] in Chart? -  Would patient like information on creating a medical advance directive? -  Pre-existing out of facility DNR order (yellow form or pink MOST form) Pink MOST/Yellow Form most recent copy in chart - Physician notified to receive inpatient order   Chief Complaint  Patient presents with  . Medical Management of Chronic Issues    4 month follow up/lab results  . Health Maintenance    Influenza ([place])     HPI: Patient is a 86 y.o. male seen today for medical management of chronic diseases.    He reports no concerns.  Reports better spirits lately.  He and his wife are getting along and adjusted more to [facility].  Reports she's had some heart issues recently and that's part of why she's not along for the appt.  Kidney function is looking a little bit better than last time.  Liver tests are normal.  He is taking his iron pills three times a week--hgb is stable around 13.    TSH is a little bit high.  Admits he occasionally missed the levothyroxine which he takes when he gets up early to urinate.  He says he will be conscientious about this.    B12 level is now ok.    He has ice cream 3-4 times a week.  Also has chocolate often.  Sugar is up to 6.4 from 5.7 last time.  He does walk 30 mins daily.    Past Medical History:  Diagnosis Date  . Anxiety   . BRADYCARDIA 11/13/2008  . Cataract   . Cholelithiasis   . COLONIC POLYPS, HX OF 02/04/2007  . DEPRESSION 02/04/2007  . DIABETES MELLITUS, TYPE II 02/04/2007  . DIVERTICULOSIS, COLON 02/04/2007  .  FOREIGN BODY, ASPIRATION 12/04/2007  . GERD (gastroesophageal reflux disease)   . GLAUCOMA 03/08/2009  . Hepatitis A 1963  . HYPERLIPIDEMIA 12/04/2007  . HYPERTENSION 02/04/2007  . Memory loss   . OBSTRUCTIVE SLEEP APNEA 11/05/2009   -PSG 01/05/10 RDI 11, PLMI 56  . OSTEOARTHRITIS 02/04/2007  . Peptic stricture of esophagus   . PROSTATE CANCER 1997  . RENAL INSUFFICIENCY 11/13/2008  . SYNCOPE 12/31/2009  . THROMBOCYTOPENIA 04/17/2008  . THYROID NODULE, RIGHT 11/13/2008  . UNSPECIFIED ANEMIA 11/07/2007    Past Surgical History:  Procedure Laterality Date  . Heart Cartherization  2000  . PROSTATECTOMY  1997  . TONSILLECTOMY  1940's    Allergies  Allergen Reactions  . Lactose Intolerance (Gi)   . Sulfonamide Derivatives     REACTION: Itch/Red Spots    Outpatient Encounter Medications as of 03/19/2020  Medication Sig  . Cholecalciferol (VITAMIN D) 2000 UNITS CAPS Take 2 capsules by mouth daily.   . ferrous sulfate 325 (65 FE) MG EC tablet Take 1 tablet (325 mg total) by mouth every other day.  . levothyroxine (SYNTHROID) 25 MCG tablet TAKE 1 TABLET IN THE MORNING BEFORE BREAKFAST.  Marland Kitchen lisinopril (ZESTRIL) 5 MG tablet TAKE 1 TABLET EACH DAY.  Marland Kitchen polycarbophil (FIBERCON) 625 MG tablet Take 625 mg by mouth daily.  Marland Kitchen  polyethylene glycol (MIRALAX / GLYCOLAX) 17 g packet Take 17 g by mouth daily. Take 1 to 2 capfuls daily at noon  . rosuvastatin (CRESTOR) 40 MG tablet TAKE 1 TABLET EACH DAY.  . traZODone (DESYREL) 100 MG tablet TAKE ONE TABLET AT BEDTIME.   No facility-administered encounter medications on file as of 03/19/2020.    Review of Systems:  Review of Systems  Constitutional: Negative for chills and fever.  HENT: Positive for hearing loss. Negative for congestion.   Eyes: Negative for blurred vision.  Respiratory: Negative for cough and shortness of breath.   Cardiovascular: Negative for chest pain, palpitations and leg swelling.       Does fine with knee high compression    Gastrointestinal: Negative for abdominal pain.       Bowels stable  Genitourinary: Negative for dysuria.  Musculoskeletal: Negative for falls and joint pain.  Skin: Negative for itching and rash.  Neurological: Negative for dizziness and loss of consciousness.  Endo/Heme/Allergies: Bruises/bleeds easily.  Psychiatric/Behavioral: Positive for memory loss. Negative for depression. The patient is not nervous/anxious and does not have insomnia.     Health Maintenance  Topic Date Due  . INFLUENZA VACCINE  01/27/2020  . FOOT EXAM  06/30/2020 (Originally 05/25/2019)  . HEMOGLOBIN A1C  05/03/2020  . OPHTHALMOLOGY EXAM  05/15/2020  . TETANUS/TDAP  12/03/2026  . COVID-19 Vaccine  Completed  . PNA vac Low Risk Adult  Completed    Physical Exam: Vitals:   03/19/20 1054  BP: 110/74  Pulse: 62  Temp: 97.9 F (36.6 C)  TempSrc: Temporal  SpO2: 95%  Weight: 198 lb 6.4 oz (90 kg)  Height: 5\' 11"  (1.803 m)   Body mass index is 27.67 kg/m. Physical Exam Vitals reviewed.  Constitutional:      General: He is not in acute distress.    Appearance: Normal appearance. He is not toxic-appearing.  HENT:     Head: Normocephalic and atraumatic.  Cardiovascular:     Rate and Rhythm: Bradycardia present.     Heart sounds: No murmur heard.   Pulmonary:     Effort: Pulmonary effort is normal.     Breath sounds: Normal breath sounds. No wheezing, rhonchi or rales.  Abdominal:     General: Bowel sounds are normal. There is no distension.     Palpations: Abdomen is soft.     Tenderness: There is no abdominal tenderness.  Musculoskeletal:        General: Normal range of motion.     Right lower leg: No edema.     Left lower leg: No edema.  Skin:    General: Skin is warm and dry.  Neurological:     General: No focal deficit present.     Mental Status: He is alert and oriented to person, place, and time. Mental status is at baseline.     Comments: Stooped posture  Psychiatric:        Mood  and Affect: Mood normal.        Behavior: Behavior normal.     Labs reviewed: Basic Metabolic Panel: Recent Labs    07/21/19 0000 07/23/19 0000 08/02/19 0000 11/01/19 0700  NA  --  132* 139 143  K  --  4.5 4.9 4.5  CL  --  99 104 107  CO2  --  21 24* 21  BUN  --  34* 30* 40*  CREATININE  --  1.9* 2.0* 1.9*  CALCIUM 7.9*  --  8.9 8.8  Liver Function Tests: Recent Labs    11/01/19 0700  AST 22  ALT 17  ALBUMIN 4.2   No results for input(s): LIPASE, AMYLASE in the last 8760 hours. No results for input(s): AMMONIA in the last 8760 hours. CBC: Recent Labs    07/21/19 0000 11/01/19 0700  WBC 9.0 10.2  NEUTROABS 4  --   HGB 12.4* 13.2*  HCT 36* 40*  PLT 87* 119*   Lipid Panel: No results for input(s): CHOL, HDL, LDLCALC, TRIG, CHOLHDL, LDLDIRECT in the last 8760 hours. Lab Results  Component Value Date   HGBA1C 5.7 11/01/2019    Assessment/Plan 1. Controlled type 2 diabetes mellitus with complication, without long-term current use of insulin ([place]) -discussed importance of cutting down on ice cream and chocolate, continue regular walking for exercise -continue ace   2. Hypothyroidism, unspecified type -he admitted to missing some doses of levothyroxine--we again reviewed how crucial it is to faithfully take the med -recheck tsh before next appt  3. Chronic kidney disease (CKD), stage IV (severe) (H[facility]) -Avoid nephrotoxic agents like nsaids, dose adjust renally excreted meds, hydrate. -a little improved, encouraged hydration  4. Major depression in remission ([place]) -cont trazodone only qhs, seems better  5. Mild cognitive impairment -ongoing, no major changes notable with visit, continue to monitor  6. DNR (do not resuscitate) - Do not attempt resuscitation (DNR)  Labs/tests ordered:  Hba1c, tsh, bmp Next appt:   4 months with fasting labs before  Khadejah Son L. Samyra Limb, D.O. [facility] [address], [place] [address] Cell Phone (Mon-Fri 8am-5pm):  [phone] On Call:  [phone] & follow prompts after 5pm & weekends Office Phone:  [phone] Office Fax:  [phone]

## 2020-04-08 DIAGNOSIS — Z961 Presence of intraocular lens: Secondary | ICD-10-CM | POA: Diagnosis not present

## 2020-04-08 DIAGNOSIS — H43813 Vitreous degeneration, bilateral: Secondary | ICD-10-CM | POA: Diagnosis not present

## 2020-04-08 DIAGNOSIS — D3132 Benign neoplasm of left choroid: Secondary | ICD-10-CM | POA: Diagnosis not present

## 2020-04-08 DIAGNOSIS — H35373 Puckering of macula, bilateral: Secondary | ICD-10-CM | POA: Diagnosis not present

## 2020-04-08 DIAGNOSIS — E113293 Type 2 diabetes mellitus with mild nonproliferative diabetic retinopathy without macular edema, bilateral: Secondary | ICD-10-CM | POA: Diagnosis not present

## 2020-04-08 LAB — HM DIABETES EYE EXAM

## 2020-04-13 ENCOUNTER — Other Ambulatory Visit: Payer: Self-pay | Admitting: Internal Medicine

## 2020-04-13 DIAGNOSIS — F320 Major depressive disorder, single episode, mild: Secondary | ICD-10-CM

## 2020-04-14 NOTE — Telephone Encounter (Signed)
rx sent to pharmacy by e-script last filled 03/18/20

## 2020-04-16 ENCOUNTER — Ambulatory Visit: Payer: Medicare Other

## 2020-04-18 ENCOUNTER — Encounter: Payer: Self-pay | Admitting: Internal Medicine

## 2020-04-21 ENCOUNTER — Encounter: Payer: Self-pay | Admitting: Internal Medicine

## 2020-04-21 ENCOUNTER — Telehealth: Payer: Self-pay

## 2020-04-21 NOTE — Telephone Encounter (Signed)
[facility] diabetic eye exam was positive for retinopathy.. I called and left a message for him to call back with test results.

## 2020-04-21 NOTE — Telephone Encounter (Signed)
Yes, he should get the booster?

## 2020-04-21 NOTE — Telephone Encounter (Signed)
It just needed to be abstracted for his health maintenance.  Hopefully he already knows.

## 2020-04-21 NOTE — Telephone Encounter (Deleted)
Called patient let him know

## 2020-04-21 NOTE — Telephone Encounter (Signed)
Patient states he has had the Moderna vaccinations, has had COVID and had the monoclonal antibodies. He is asking if he should get the booster.

## 2020-04-21 NOTE — Telephone Encounter (Signed)
VM left for patient as per patient request.

## 2020-05-08 ENCOUNTER — Other Ambulatory Visit: Payer: Self-pay | Admitting: Internal Medicine

## 2020-05-13 DIAGNOSIS — Z23 Encounter for immunization: Secondary | ICD-10-CM | POA: Diagnosis not present

## 2020-05-16 ENCOUNTER — Other Ambulatory Visit: Payer: Self-pay | Admitting: Internal Medicine

## 2020-05-16 DIAGNOSIS — F320 Major depressive disorder, single episode, mild: Secondary | ICD-10-CM

## 2020-05-16 NOTE — Telephone Encounter (Signed)
Patient is requesting refill on medication "Trazodone". Last refill for medication was 04/14/2020 with 30 tablets to be taken daily. No additional refills were given. Patient is due for medication refill. Medication pend and sent to PCP Mariea Clonts, Tiffany L, DO . Please Advise.

## 2020-06-02 DIAGNOSIS — H52203 Unspecified astigmatism, bilateral: Secondary | ICD-10-CM | POA: Diagnosis not present

## 2020-06-02 DIAGNOSIS — D3132 Benign neoplasm of left choroid: Secondary | ICD-10-CM | POA: Diagnosis not present

## 2020-06-02 DIAGNOSIS — E103292 Type 1 diabetes mellitus with mild nonproliferative diabetic retinopathy without macular edema, left eye: Secondary | ICD-10-CM | POA: Diagnosis not present

## 2020-06-02 DIAGNOSIS — H35373 Puckering of macula, bilateral: Secondary | ICD-10-CM | POA: Diagnosis not present

## 2020-06-04 ENCOUNTER — Non-Acute Institutional Stay: Payer: Medicare Other | Admitting: Internal Medicine

## 2020-06-04 ENCOUNTER — Encounter: Payer: Self-pay | Admitting: Internal Medicine

## 2020-06-04 ENCOUNTER — Other Ambulatory Visit: Payer: Self-pay

## 2020-06-04 VITALS — BP 130/74 | HR 63 | Temp 97.9°F | Ht 71.0 in | Wt 198.0 lb

## 2020-06-04 DIAGNOSIS — J0100 Acute maxillary sinusitis, unspecified: Secondary | ICD-10-CM | POA: Diagnosis not present

## 2020-06-04 LAB — HM DIABETES EYE EXAM

## 2020-06-04 MED ORDER — AZITHROMYCIN 250 MG PO TABS: ORAL_TABLET | ORAL | 0 refills | Status: DC

## 2020-06-04 NOTE — Patient Instructions (Signed)
Please get a nettipot or nasal saline spray.

## 2020-06-04 NOTE — Progress Notes (Signed)
Location:  [place] of Service:  Clinic (12)  Provider: Lu Paradise L. Mariea Clonts, D.O., C.M.D.  Code Status: DNR Goals of Care:  Advanced Directives 06/04/2020  Does Patient Have a Medical Advance Directive? Yes  Type of Advance Directive Out of facility DNR (pink MOST or yellow form)  Does patient want to make changes to medical advance directive? No - Patient declined  Copy of [place] in Chart? -  Would patient like information on creating a medical advance directive? -  Pre-existing out of facility DNR order (yellow form or pink MOST form) Pink MOST/Yellow Form most recent copy in chart - Physician notified to receive inpatient order     Chief Complaint  Patient presents with  . Acute Visit    coughing upp mucus x 1 week     HPI: Patient is a 86 y.o. male seen today for an acute visit for coughing up mucus for the past week.  He has been ill for a few weeks.  Mucus is yellowish.  No fever.  He's been colder than normal (last night).  He's a fan of being very warm.  No ear pain.  No sore throat.  No diarrhea.  No myalgias.  He's blowing his nose a lot.  He has more mucus in the left than right nostril.  Cough is really bad at night.  It keeps [place] awake.  He's had a h/o prostate cancer, but not active.    Past Medical History:  Diagnosis Date  . Anxiety   . BRADYCARDIA 11/13/2008  . Cataract   . Cholelithiasis   . COLONIC POLYPS, HX OF 02/04/2007  . DEPRESSION 02/04/2007  . DIABETES MELLITUS, TYPE II 02/04/2007  . DIVERTICULOSIS, COLON 02/04/2007  . FOREIGN BODY, ASPIRATION 12/04/2007  . GERD (gastroesophageal reflux disease)   . GLAUCOMA 03/08/2009  . Hepatitis A 1963  . HYPERLIPIDEMIA 12/04/2007  . HYPERTENSION 02/04/2007  . Memory loss   . OBSTRUCTIVE SLEEP APNEA 11/05/2009   -PSG 01/05/10 RDI 11, PLMI 56  . OSTEOARTHRITIS 02/04/2007  . Peptic stricture of esophagus   . PROSTATE CANCER 1997  . RENAL INSUFFICIENCY 11/13/2008  . SYNCOPE 12/31/2009  .  THROMBOCYTOPENIA 04/17/2008  . THYROID NODULE, RIGHT 11/13/2008  . UNSPECIFIED ANEMIA 11/07/2007    Past Surgical History:  Procedure Laterality Date  . Heart Cartherization  2000  . PROSTATECTOMY  1997  . TONSILLECTOMY  1940's    Allergies  Allergen Reactions  . Lactose Intolerance (Gi)   . Sulfonamide Derivatives     REACTION: Itch/Red Spots    Outpatient Encounter Medications as of 06/04/2020  Medication Sig  . Cholecalciferol (VITAMIN D) 2000 UNITS CAPS Take 2 capsules by mouth daily.   . ferrous sulfate 325 (65 FE) MG EC tablet Take 1 tablet (325 mg total) by mouth every other day.  . levothyroxine (SYNTHROID) 25 MCG tablet TAKE 1 TABLET IN THE MORNING BEFORE BREAKFAST.  Marland Kitchen lisinopril (ZESTRIL) 5 MG tablet TAKE 1 TABLET EACH DAY.  Marland Kitchen polycarbophil (FIBERCON) 625 MG tablet Take 625 mg by mouth daily.  . polyethylene glycol (MIRALAX / GLYCOLAX) 17 g packet Take 17 g by mouth daily. Take 1 to 2 capfuls daily at noon  . rosuvastatin (CRESTOR) 40 MG tablet TAKE 1 TABLET EACH DAY.  . traZODone (DESYREL) 100 MG tablet TAKE ONE TABLET AT BEDTIME.   No facility-administered encounter medications on file as of 06/04/2020.    Review of Systems:  Review of Systems  Constitutional: Positive  for chills. Negative for fever, malaise/fatigue and weight loss.  HENT: Positive for congestion and hearing loss. Negative for sore throat.   Eyes: Negative for redness.  Respiratory: Positive for cough. Negative for shortness of breath.   Cardiovascular: Negative for chest pain and palpitations.  Gastrointestinal: Negative for abdominal pain, blood in stool, constipation, diarrhea, melena, nausea and vomiting.  Genitourinary: Negative for dysuria.  Musculoskeletal: Negative for falls and joint pain.  Neurological: Negative for dizziness and loss of consciousness.  Psychiatric/Behavioral: Positive for memory loss. Negative for depression. The patient is not nervous/anxious and does not have insomnia.      Health Maintenance  Topic Date Due  . FOOT EXAM  06/30/2020 (Originally 05/25/2019)  . HEMOGLOBIN A1C  09/08/2020  . OPHTHALMOLOGY EXAM  06/04/2021  . TETANUS/TDAP  12/03/2026  . INFLUENZA VACCINE  Completed  . COVID-19 Vaccine  Completed  . PNA vac Low Risk Adult  Completed    Physical Exam: Vitals:   06/04/20 1028  BP: 130/74  Pulse: 63  Temp: 97.9 F (36.6 C)  TempSrc: Temporal  SpO2: 97%  Weight: 198 lb (89.8 kg)  Height: 5\' 11"  (1.803 m)   Body mass index is 27.62 kg/m. Physical Exam Vitals reviewed.  Constitutional:      General: He is not in acute distress.    Appearance: He is not ill-appearing or toxic-appearing.  HENT:     Head: Normocephalic and atraumatic.     Right Ear: External ear normal.     Left Ear: External ear normal.     Ears:     Comments: Hearing aids    Nose: Congestion present.     Mouth/Throat:     Comments: Postnasal drip white to yellow Eyes:     Conjunctiva/sclera: Conjunctivae normal.  Cardiovascular:     Rate and Rhythm: Normal rate and regular rhythm.  Pulmonary:     Effort: Pulmonary effort is normal.     Breath sounds: Normal breath sounds. No wheezing, rhonchi or rales.  Abdominal:     General: Bowel sounds are normal.  Musculoskeletal:        General: Normal range of motion.     Cervical back: Neck supple.     Comments: Stooped posture  Lymphadenopathy:     Cervical: No cervical adenopathy.  Neurological:     General: No focal deficit present.     Mental Status: He is alert and oriented to person, place, and time. Mental status is at baseline.     Motor: No weakness.     Gait: Gait normal.  Psychiatric:        Mood and Affect: Mood normal.     Labs reviewed: Basic Metabolic Panel: Recent Labs    08/02/19 0000 11/01/19 0700 03/11/20 0000  NA 139 143 141  K 4.9 4.5 4.8  CL 104 107 106  CO2 24* 21 23*  BUN 30* 40* 31*  CREATININE 2.0* 1.9* 1.8*  CALCIUM 8.9 8.8 9.3  TSH  --   --  5.32   Liver  Function Tests: Recent Labs    11/01/19 0700 03/11/20 0000  AST 22 21  ALT 17 15  ALBUMIN 4.2 3.9   No results for input(s): LIPASE, AMYLASE in the last 8760 hours. No results for input(s): AMMONIA in the last 8760 hours. CBC: Recent Labs    07/21/19 0000 11/01/19 0700 03/11/20 0000  WBC 9.0 10.2 8.9  NEUTROABS 4  --   --   HGB 12.4* 13.2* 13.0*  HCT  36* 40* 39*  PLT 87* 119* 139*   Lipid Panel: No results for input(s): CHOL, HDL, LDLCALC, TRIG, CHOLHDL, LDLDIRECT in the last 8760 hours. Lab Results  Component Value Date   HGBA1C 6.4 03/11/2020    Assessment/Plan 1. Subacute maxillary sinusitis -covid rapid swab was negative for covid -s/s consistent with a few weeks of sinus infection -due to unrelenting cough and congestion, chills and discolored sputum, will treat now with abx - azithromycin (ZITHROMAX) 250 MG tablet; 2 tablets today, then 1 tablet daily for 4 days  Dispense: 6 tablet; Refill: 0 -also recommended saline flushes   Labs/tests ordered:  Negative rapid covid swab Next appt:  07/23/2020  [name], D.O. [facility] [address], [place] [address] Cell Phone (Mon-Fri 8am-5pm):  [phone] On Call:  [phone] & follow prompts after 5pm & weekends Office Phone:  [phone] Office Fax:  [phone]

## 2020-06-06 ENCOUNTER — Other Ambulatory Visit: Payer: Self-pay | Admitting: Internal Medicine

## 2020-06-11 ENCOUNTER — Other Ambulatory Visit: Payer: Self-pay

## 2020-06-11 ENCOUNTER — Ambulatory Visit (INDEPENDENT_AMBULATORY_CARE_PROVIDER_SITE_OTHER): Payer: Medicare Other | Admitting: Cardiovascular Disease

## 2020-06-11 ENCOUNTER — Encounter: Payer: Self-pay | Admitting: Cardiovascular Disease

## 2020-06-11 VITALS — BP 130/70 | HR 60 | Ht 71.0 in | Wt 197.2 lb

## 2020-06-11 DIAGNOSIS — E782 Mixed hyperlipidemia: Secondary | ICD-10-CM

## 2020-06-11 DIAGNOSIS — N1832 Chronic kidney disease, stage 3b: Secondary | ICD-10-CM

## 2020-06-11 DIAGNOSIS — I10 Essential (primary) hypertension: Secondary | ICD-10-CM

## 2020-06-11 NOTE — Patient Instructions (Signed)
Medication Instructions:  Your provider recommends that you continue on your current medications as directed. Please refer to the Current Medication list given to you today.   *If you need a refill on your cardiac medications before your next appointment, please call your pharmacy*  Follow-Up: At [facility], you and your health needs are our priority.  As part of our continuing mission to provide you with exceptional heart care, we have created designated Provider Care Teams.  These Care Teams include your primary Cardiologist (physician) and Advanced Practice Providers (APPs -  Physician Assistants and Nurse Practitioners) who all work together to provide you with the care you need, when you need it. Your next appointment:   12 month(s) The format for your next appointment:   In Person Provider:   You may see Sherren Mocha, MD or one of the following Advanced Practice Providers on your designated Care Team:    Richardson Dopp, PA-C  [place], [place]    Chronic Kidney Disease Chronic kidney disease (CKD) occurs when the kidneys are damaged for at least 3 months and do not function effectively. The kidneys are two organs that do many important jobs in the body, including:  Removing waste and extra fluid from the blood to make urine.  Making hormones that maintain the amount of fluid in tissues and blood vessels.  Maintaining the right amount of fluids and electrolytes in the body. A small amount of kidney damage may not cause problems, but a large amount of damage may make it hard or impossible for the kidneys to work the way they should. CKD gets worse over time (is progressive). You can take steps to prevent CKD or to keep it from getting worse. The best way to prevent kidney damage is to know your risk factors and make changes before you develop symptoms of CKD. How can this condition affect me? At first, you may not notice any signs or symptoms of CKD. Symptoms develop slowly and  may not be obvious until the kidney damage becomes severe. If steps are not taken to prevent or slow down the disease, CKD can lead to:  A low red blood cell count (anemia).  Heart disease.  Weak bones.  Nerve damage (neuropathy).  Stroke.  Kidney failure and dialysis. What can increase my risk? You are more likely to develop CKD if you:  Are 64 years of age or older.  Are male.  Are of African American, Native American, Hispanic, Asian, or [place] descent.  Are obese.  Have taken certain medicines for a long time.  Use tobacco or have used it in the past.  Have any of the following conditions: ? Diabetes. ? High blood pressure. ? Heart disease. ? Multiple myeloma. ? An autoimmune disease. ? Frequent urinary tract infections. ? Polycystic kidney disease.  Have a family history of kidney disease, heart disease, diabetes, or high blood pressure.  Have problems with urine flow that may be caused by: ? Cancer. ? Having kidney stones more than once. ? An enlarged prostate in males. What actions can I take to prevent CKD? Managing conditions that put you at risk  Talk to your health care provider about your kidney health and your risk factors for CKD.  Work with your health care provider to manage conditions such as high blood pressure or diabetes. This may involve taking medicines, eating healthy, or making lifestyle changes to: ? Get high blood pressure down to the target that your health care provider recommends. ?  Get blood sugar (glucose) levels down to the target that your health care provider recommends. Eating and drinking   Follow instructions from your health care provider about diet. This may include: ? Limiting salt (sodium) intake. You should have less than 1 tsp (2,300 mg) of sodium a day. If you have heart disease or high blood pressure, you should have less than  tsp (1,500 mg) of sodium a day. ? Limiting protein intake as told by your  health care provider. Avoid high-protein foods. ? Eating a balanced, heart-healthy diet. ? Avoiding foods that are high in potassium and phosphorous.  Limit alcohol. If you drink alcohol: ? Limit how much you use to:  0-1 drink a day for nonpregnant women.  0-2 drinks a day for men. ? Be aware of how much alcohol is in your drink. In the [place] one drink equals one 12 oz bottle of beer (355 mL), one 5 oz glass of wine (148 mL), or one 1 oz glass of hard liquor (44 mL).  If you have diabetes, work with a Financial planner (Firefighter) or a certified diabetes educator to develop a healthy eating plan.  Talk with your health care provider about how much fluid you should drink each day. Lifestyle   Exercise for at least 30 minutes on 5 or more days of the week, or as much as told by your health care provider.  Keep your weight at a healthy level. If you are overweight or obese, lose weight as told by your health care provider.  Do not use any products that contain nicotine or tobacco, such as cigarettes, e-cigarettes, and chewing tobacco. If you need help quitting, ask your health care provider. General instructions  Take over-the-counter and prescription medicines only as told by your health care provider. Do not take any new medicines unless approved by your health care provider.  Avoid NSAIDs. Use NSAIDs for pain only when necessary. NSAIDs include: ibuprofen, naproxen, diclofenac, celecoxib, mefenamic acid, etoricoxib.indomethacin.  Have a yearly physical exam.  Learn about your family's medical history. Talk to your relatives and siblings about diabetes, heart disease, and high blood pressure. Where to find more information Learn more about CKD and how to prevent CKD from:  [place]: www.kidney.org  American Association of Kidney Patients: BombTimer.gl  American Diabetes Association: www.diabetes.org Summary  Symptoms of CKD develop slowly and  may not be obvious until the kidney damage becomes severe.  The best way to prevent kidney damage is to know your risk factors and make nutrition and lifestyle changes before you develop symptoms of CKD.  Follow instructions from your health care provider about diet, which may include limiting how much salt, protein, and alcohol you consume.  Work with your health care provider to keep your blood pressure and blood sugar levels within the recommended range. This information is not intended to replace advice given to you by your health care provider. Make sure you discuss any questions you have with your health care provider. Document Revised: 10/06/2018 Document Reviewed: 07/19/2018 Elsevier Patient Education  [place].

## 2020-06-11 NOTE — Progress Notes (Signed)
Cardiology Office Note:    Date:  06/11/2020   ID:  [name], DOB 06/28/1928, MRN [id number]  PCP:  Gerald Curry, DO  [place] Cardiologist:  Gerald Mocha, MD  [place] Electrophysiologist:  None   Referring MD: Gerald Curry, DO   Chief Complaint  Patient presents with  . Hypertension    History of Present Illness:    [name] is a 86 y.o. male with a hx of hypertension, presenting for follow-up evaluation.  The patient has a history of remote syncope with no problems over recent years.  He is here alone today for follow-up evaluation.  He was last seen here in December 2020 for cardiology follow-up.  The patient reports that he has been doing well.  He has no specific complaints today.  He denies chest pain, chest pressure, or shortness of breath.  He walks for 30 minutes most days without limitation.  He does have mild leg swelling at times.  No orthopnea, PND, or heart palpitations.  He is compliant with his medications.  Past Medical History:  Diagnosis Date  . Anxiety   . BRADYCARDIA 11/13/2008  . Cataract   . Cholelithiasis   . COLONIC POLYPS, HX OF 02/04/2007  . DEPRESSION 02/04/2007  . DIABETES MELLITUS, TYPE II 02/04/2007  . DIVERTICULOSIS, COLON 02/04/2007  . FOREIGN BODY, ASPIRATION 12/04/2007  . GERD (gastroesophageal reflux disease)   . GLAUCOMA 03/08/2009  . Hepatitis A 1963  . HYPERLIPIDEMIA 12/04/2007  . HYPERTENSION 02/04/2007  . Memory loss   . OBSTRUCTIVE SLEEP APNEA 11/05/2009   -PSG 01/05/10 RDI 11, PLMI 56  . OSTEOARTHRITIS 02/04/2007  . Peptic stricture of esophagus   . PROSTATE CANCER 1997  . RENAL INSUFFICIENCY 11/13/2008  . SYNCOPE 12/31/2009  . THROMBOCYTOPENIA 04/17/2008  . THYROID NODULE, RIGHT 11/13/2008  . UNSPECIFIED ANEMIA 11/07/2007    Past Surgical History:  Procedure Laterality Date  . Heart Cartherization  2000  . PROSTATECTOMY  1997  . TONSILLECTOMY  1940's    Current Medications: Current Meds  Medication Sig   . Cholecalciferol (VITAMIN D) 2000 UNITS CAPS Take 2 capsules by mouth daily.  . ferrous sulfate 325 (65 FE) MG EC tablet Take 1 tablet (325 mg total) by mouth every other day.  . levothyroxine (SYNTHROID) 25 MCG tablet TAKE 1 TABLET IN THE MORNING BEFORE BREAKFAST.  Marland Kitchen lisinopril (ZESTRIL) 5 MG tablet TAKE 1 TABLET EACH DAY.  Marland Kitchen polycarbophil (FIBERCON) 625 MG tablet Take 625 mg by mouth daily.  . polyethylene glycol (MIRALAX / GLYCOLAX) 17 g packet Take 17 g by mouth daily. Take 1 to 2 capfuls daily at noon  . rosuvastatin (CRESTOR) 40 MG tablet TAKE 1 TABLET EACH DAY.  . traZODone (DESYREL) 100 MG tablet TAKE ONE TABLET AT BEDTIME.     Allergies:   Lactose intolerance (gi) and Sulfonamide derivatives   Social History   Socioeconomic History  . Marital status: Married    Spouse name: [name]  . Number of children: 3  . Years of education: college  . Highest education level: Bachelor's degree (e.g., BA, AB, BS)  Occupational History  . Occupation: Immunologist    Comment: Retired  Tobacco Use  . Smoking status: Former Smoker    Types: Cigarettes, Pipe    Quit date: 07/08/1993    Years since quitting: 26.9  . Smokeless tobacco: Never Used  Vaping Use  . Vaping Use: Never used  Substance and Sexual Activity  . Alcohol use:  Yes    Comment: minimal  . Drug use: No  . Sexual activity: Not on file  Other Topics Concern  . Not on file  Social History Narrative   He played tennis 3x a week before moving to [place].   Right-handed.   2 cups caffeine per day.      Social Determinants of Health   Financial Resource Strain: Not on file  Food Insecurity: Not on file  Transportation Needs: Not on file  Physical Activity: Not on file  Stress: Not on file  Social Connections: Not on file     Family History: The patient's family history includes Kidney disease in his father; Other in his mother. There is no history of Heart disease, Cancer, Colon  cancer, Esophageal cancer, Inflammatory bowel disease, Liver disease, Pancreatic cancer, Rectal cancer, or Stomach cancer.  ROS:   Please see the history of present illness.    Positive for fatigue, memory loss. All other systems reviewed and are negative.  EKGs/Labs/Other Studies Reviewed:    The following studies were reviewed today: Echo 05/01/2018: Study Conclusions   - Left ventricle: The cavity size was normal. Systolic function was  normal. The estimated ejection fraction was in the range of 55%  to 60%. Wall motion was normal; there were no regional wall  motion abnormalities. There was an increased relative  contribution of atrial contraction to ventricular filling.  Doppler parameters are consistent with abnormal left ventricular  relaxation (grade 1 diastolic dysfunction). Indeterminate mean  left atrial filling pressure.  - Aortic valve: There was mild regurgitation.  - Left atrium: The atrium was mildly dilated.  - Right ventricle: The cavity size was mildly dilated. Wall  thickness was normal.  - Right atrium: The atrium was mildly dilated.   EKG:  EKG is ordered today.  The ekg ordered today demonstrates normal sinus rhythm 60 bpm, first-degree AV block, low voltage QRS, otherwise within normal limits.No significant change since last tracing 1 year ago.  Recent Labs: 03/11/2020: ALT 15; BUN 31; Creatinine 1.8; Hemoglobin 13.0; Platelets 139; Potassium 4.8; Sodium 141; TSH 5.32  Recent Lipid Panel    Component Value Date/Time   CHOL 137 01/11/2019 0000   TRIG 121 01/11/2019 0000   TRIG 48 04/05/2006 0750   HDL 36 01/11/2019 0000   CHOLHDL 4 11/22/2017 1003   VLDL 32.6 11/22/2017 1003   LDLCALC 76 01/11/2019 0000     Risk Assessment/Calculations:       Physical Exam:    VS:  BP 130/70   Pulse 60   Ht $R'5\' 11"'Za$  (1.803 m)   Wt 197 lb 3.2 oz (89.4 kg)   SpO2 95%   BMI 27.50 kg/m     Wt Readings from Last 3 Encounters:  06/11/20 197 lb 3.2  oz (89.4 kg)  06/04/20 198 lb (89.8 kg)  03/19/20 198 lb 6.4 oz (90 kg)     GEN:  Well nourished, well developed elderly male in no acute distress HEENT: Normal NECK: No JVD; No carotid bruits LYMPHATICS: No lymphadenopathy CARDIAC: RRR, no murmurs, rubs, gallops RESPIRATORY:  Clear to auscultation without rales, wheezing or rhonchi  ABDOMEN: Soft, non-tender, non-distended MUSCULOSKELETAL:  No edema; No deformity  SKIN: Warm and dry NEUROLOGIC:  Alert and oriented x 3 PSYCHIATRIC:  Normal affect   ASSESSMENT:    1. Essential hypertension   2. Mixed hyperlipidemia   3. Stage 3b chronic kidney disease ([place])    PLAN:    In order of problems listed  above:  1. Blood pressure is well controlled on lisinopril.  Importance of adequate fluid hydration as discussed today.  Appears to be stable. 2. Treated with rosuvastatin 40 mg daily.  Last lipids show LDL cholesterol of 76 mg/dL.  ALT from 03/11/2020 is 15.  Patient has no history of ischemic cardiac events.  Continue current management. 3. Reviewed creatinine trend over time and he appears to be stable.  His most recent creatinine is 1.8.  He is appropriately on an ACE inhibitor at low-dose.  Blood pressure is under ideal control especially considering his advanced age.  He understands the importance of adequate water intake.  I talked to him about avoiding nonsteroidal anti-inflammatory drugs.  He will continue to follow with his primary care physician.  Recent potassium and hemoglobin are also within normal limits and these are reviewed today.  Medication Adjustments/Labs and Tests Ordered: Current medicines are reviewed at length with the patient today.  Concerns regarding medicines are outlined above.  Orders Placed This Encounter  Procedures  . EKG 12-Lead   No orders of the defined types were placed in this encounter.   Patient Instructions  Medication Instructions:  Your provider recommends that you continue on your current  medications as directed. Please refer to the Current Medication list given to you today.   *If you need a refill on your cardiac medications before your next appointment, please call your pharmacy*  Follow-Up: At [facility], you and your health needs are our priority.  As part of our continuing mission to provide you with exceptional heart care, we have created designated Provider Care Teams.  These Care Teams include your primary Cardiologist (physician) and Advanced Practice Providers (APPs -  Physician Assistants and Nurse Practitioners) who all work together to provide you with the care you need, when you need it. Your next appointment:   12 month(s) The format for your next appointment:   In Person Provider:   You may see Gerald Mocha, MD or one of the following Advanced Practice Providers on your designated Care Team:    Richardson Dopp, PA-C  Vin [place], [place]    Chronic Kidney Disease Chronic kidney disease (CKD) occurs when the kidneys are damaged for at least 3 months and do not function effectively. The kidneys are two organs that do many important jobs in the body, including:  Removing waste and extra fluid from the blood to make urine.  Making hormones that maintain the amount of fluid in tissues and blood vessels.  Maintaining the right amount of fluids and electrolytes in the body. A small amount of kidney damage may not cause problems, but a large amount of damage may make it hard or impossible for the kidneys to work the way they should. CKD gets worse over time (is progressive). You can take steps to prevent CKD or to keep it from getting worse. The best way to prevent kidney damage is to know your risk factors and make changes before you develop symptoms of CKD. How can this condition affect me? At first, you may not notice any signs or symptoms of CKD. Symptoms develop slowly and may not be obvious until the kidney damage becomes severe. If steps are not taken to  prevent or slow down the disease, CKD can lead to:  A low red blood cell count (anemia).  Heart disease.  Weak bones.  Nerve damage (neuropathy).  Stroke.  Kidney failure and dialysis. What can increase my risk? You are more likely to develop  CKD if you:  Are 41 years of age or older.  Are male.  Are of African American, Native American, Hispanic, Asian, or [place] descent.  Are obese.  Have taken certain medicines for a long time.  Use tobacco or have used it in the past.  Have any of the following conditions: ? Diabetes. ? High blood pressure. ? Heart disease. ? Multiple myeloma. ? An autoimmune disease. ? Frequent urinary tract infections. ? Polycystic kidney disease.  Have a family history of kidney disease, heart disease, diabetes, or high blood pressure.  Have problems with urine flow that may be caused by: ? Cancer. ? Having kidney stones more than once. ? An enlarged prostate in males. What actions can I take to prevent CKD? Managing conditions that put you at risk  Talk to your health care provider about your kidney health and your risk factors for CKD.  Work with your health care provider to manage conditions such as high blood pressure or diabetes. This may involve taking medicines, eating healthy, or making lifestyle changes to: ? Get high blood pressure down to the target that your health care provider recommends. ? Get blood sugar (glucose) levels down to the target that your health care provider recommends. Eating and drinking   Follow instructions from your health care provider about diet. This may include: ? Limiting salt (sodium) intake. You should have less than 1 tsp (2,300 mg) of sodium a day. If you have heart disease or high blood pressure, you should have less than  tsp (1,500 mg) of sodium a day. ? Limiting protein intake as told by your health care provider. Avoid high-protein foods. ? Eating a balanced, heart-healthy  diet. ? Avoiding foods that are high in potassium and phosphorous.  Limit alcohol. If you drink alcohol: ? Limit how much you use to:  0-1 drink a day for nonpregnant women.  0-2 drinks a day for men. ? Be aware of how much alcohol is in your drink. In the [place] one drink equals one 12 oz bottle of beer (355 mL), one 5 oz glass of wine (148 mL), or one 1 oz glass of hard liquor (44 mL).  If you have diabetes, work with a Financial planner (Firefighter) or a certified diabetes educator to develop a healthy eating plan.  Talk with your health care provider about how much fluid you should drink each day. Lifestyle   Exercise for at least 30 minutes on 5 or more days of the week, or as much as told by your health care provider.  Keep your weight at a healthy level. If you are overweight or obese, lose weight as told by your health care provider.  Do not use any products that contain nicotine or tobacco, such as cigarettes, e-cigarettes, and chewing tobacco. If you need help quitting, ask your health care provider. General instructions  Take over-the-counter and prescription medicines only as told by your health care provider. Do not take any new medicines unless approved by your health care provider.  Avoid NSAIDs. Use NSAIDs for pain only when necessary. NSAIDs include: ibuprofen, naproxen, diclofenac, celecoxib, mefenamic acid, etoricoxib.indomethacin.  Have a yearly physical exam.  Learn about your family's medical history. Talk to your relatives and siblings about diabetes, heart disease, and high blood pressure. Where to find more information Learn more about CKD and how to prevent CKD from:  [place]: www.kidney.org  American Association of Kidney Patients: BombTimer.gl  American Diabetes Association: www.diabetes.org Summary  Symptoms of CKD develop slowly and may not be obvious until the kidney damage becomes severe.  The best way to  prevent kidney damage is to know your risk factors and make nutrition and lifestyle changes before you develop symptoms of CKD.  Follow instructions from your health care provider about diet, which may include limiting how much salt, protein, and alcohol you consume.  Work with your health care provider to keep your blood pressure and blood sugar levels within the recommended range. This information is not intended to replace advice given to you by your health care provider. Make sure you discuss any questions you have with your health care provider. Document Revised: 10/06/2018 Document Reviewed: 07/19/2018 Elsevier Patient Education  2020 [place], Gerald Mocha, MD  06/11/2020 5:03 PM    [place]

## 2020-06-12 ENCOUNTER — Other Ambulatory Visit: Payer: Self-pay | Admitting: Internal Medicine

## 2020-07-03 ENCOUNTER — Ambulatory Visit: Payer: Medicare Other | Admitting: Nurse Practitioner

## 2020-07-17 ENCOUNTER — Encounter: Payer: Self-pay | Admitting: Internal Medicine

## 2020-07-17 ENCOUNTER — Telehealth: Payer: Self-pay

## 2020-07-17 DIAGNOSIS — E1122 Type 2 diabetes mellitus with diabetic chronic kidney disease: Secondary | ICD-10-CM | POA: Diagnosis not present

## 2020-07-17 DIAGNOSIS — E663 Overweight: Secondary | ICD-10-CM | POA: Diagnosis not present

## 2020-07-17 DIAGNOSIS — R531 Weakness: Secondary | ICD-10-CM | POA: Diagnosis not present

## 2020-07-17 DIAGNOSIS — D696 Thrombocytopenia, unspecified: Secondary | ICD-10-CM | POA: Diagnosis not present

## 2020-07-17 DIAGNOSIS — E039 Hypothyroidism, unspecified: Secondary | ICD-10-CM | POA: Diagnosis not present

## 2020-07-17 DIAGNOSIS — I10 Essential (primary) hypertension: Secondary | ICD-10-CM | POA: Diagnosis not present

## 2020-07-17 LAB — CBC AND DIFFERENTIAL
HCT: 37 — AB (ref 41–53)
Hemoglobin: 12.8 — AB (ref 13.5–17.5)
Platelets: 133 — AB (ref 150–399)
WBC: 8.8

## 2020-07-17 LAB — LIPID PANEL
Cholesterol: 116 (ref 0–200)
HDL: 34 — AB (ref 35–70)
LDL Cholesterol: 62
Triglycerides: 104 (ref 40–160)

## 2020-07-17 LAB — BASIC METABOLIC PANEL
BUN: 39 — AB (ref 4–21)
CO2: 23 — AB (ref 13–22)
Chloride: 107 (ref 99–108)
Creatinine: 2 — AB (ref 0.6–1.3)
Glucose: 103
Potassium: 4.7 (ref 3.4–5.3)
Sodium: 142 (ref 137–147)

## 2020-07-17 LAB — COMPREHENSIVE METABOLIC PANEL
Albumin: 4 (ref 3.5–5.0)
Calcium: 8.6 — AB (ref 8.7–10.7)
Globulin: 1.8

## 2020-07-17 LAB — HEPATIC FUNCTION PANEL
ALT: 13 (ref 10–40)
AST: 23 (ref 14–40)

## 2020-07-17 LAB — VITAMIN B12: Vitamin B-12: 485

## 2020-07-17 LAB — CBC: RBC: 3.98 (ref 3.87–5.11)

## 2020-07-17 LAB — TSH: TSH: 5.35 (ref 0.41–5.90)

## 2020-07-17 NOTE — Telephone Encounter (Signed)
Heather the Nurse from [employer] called and states that she doesn't have orders for patient. She states that she will draw CMP, CBC, TSH, Lipid, B12. And Hemoglobin A1C. Per Dr.Reed it is fine to draw these labs. I notified Nira Conn and conversation ended.

## 2020-07-23 ENCOUNTER — Other Ambulatory Visit: Payer: Self-pay

## 2020-07-23 ENCOUNTER — Encounter: Payer: Self-pay | Admitting: Internal Medicine

## 2020-07-23 ENCOUNTER — Non-Acute Institutional Stay: Payer: Medicare Other | Admitting: Internal Medicine

## 2020-07-23 VITALS — BP 126/62 | HR 65 | Temp 97.7°F | Ht 71.0 in | Wt 200.0 lb

## 2020-07-23 DIAGNOSIS — K5901 Slow transit constipation: Secondary | ICD-10-CM

## 2020-07-23 DIAGNOSIS — E039 Hypothyroidism, unspecified: Secondary | ICD-10-CM

## 2020-07-23 DIAGNOSIS — N184 Chronic kidney disease, stage 4 (severe): Secondary | ICD-10-CM

## 2020-07-23 DIAGNOSIS — G3184 Mild cognitive impairment, so stated: Secondary | ICD-10-CM | POA: Diagnosis not present

## 2020-07-23 DIAGNOSIS — E118 Type 2 diabetes mellitus with unspecified complications: Secondary | ICD-10-CM

## 2020-07-23 NOTE — Progress Notes (Signed)
Location:  Occupational psychologist of Service:  Clinic (12)  Provider: Daryll Spisak L. Mariea Clonts, D.O., C.M.D.  Code Status: DNR Goals of Care:  Advanced Directives 07/23/2020  Does Patient Have a Medical Advance Directive? Yes  Type of Advance Directive Out of facility DNR (pink MOST or yellow form)  Does patient want to make changes to medical advance directive? No - Patient declined  Copy of [place] in Chart? -  Would patient like information on creating a medical advance directive? -  Pre-existing out of facility DNR order (yellow form or pink MOST form) Pink MOST/Yellow Form most recent copy in chart - Physician notified to receive inpatient order     Chief Complaint  Patient presents with  . Medical Management of Chronic Issues    4 month follow up     HPI: Patient is a 87 y.o. male seen today for medical management of chronic diseases.    He is unsteady today.  He's blaming his loafers for it.  Not dizzy.  He is not drinking enough water per [place]. She brings him water in the middle of the day.  He does talk about drinking with his pills.  Bowels:  They are weird.  Has a couple of movements per day.  He gets an urge 2-3 times per day.  Takes his miralax.  He used to have large pieces and now smaller and requires more effort.  Had an appt with Dr. Roxy Cedar NP but he didn't keep it after he was going regularly.    He does think he eats less.  He does not eat veggies.  He does have raisin bran 2-3 times per week.  [name] notes he does not eat enough veggies and fruits.    He reported something about his hip to his wife, but he's not remembering it to tell me now.  He tries to walk for 30 mins outside daily unless it's really cold.   Sleeps like a log until 5-6am when he has to urinate.  Urinating is fine.  For the most part, he's in good spirits.  He does occasionally get frustrated.  His computer and hearing aids work.    He maintains mild  anemia.  He is on iron three times weekly on mon/wed/fri.    Past Medical History:  Diagnosis Date  . Anxiety   . BRADYCARDIA 11/13/2008  . Cataract   . Cholelithiasis   . COLONIC POLYPS, HX OF 02/04/2007  . DEPRESSION 02/04/2007  . DIABETES MELLITUS, TYPE II 02/04/2007  . DIVERTICULOSIS, COLON 02/04/2007  . FOREIGN BODY, ASPIRATION 12/04/2007  . GERD (gastroesophageal reflux disease)   . GLAUCOMA 03/08/2009  . Hepatitis A 1963  . HYPERLIPIDEMIA 12/04/2007  . HYPERTENSION 02/04/2007  . Memory loss   . OBSTRUCTIVE SLEEP APNEA 11/05/2009   -PSG 01/05/10 RDI 11, PLMI 56  . OSTEOARTHRITIS 02/04/2007  . Peptic stricture of esophagus   . PROSTATE CANCER 1997  . RENAL INSUFFICIENCY 11/13/2008  . SYNCOPE 12/31/2009  . THROMBOCYTOPENIA 04/17/2008  . THYROID NODULE, RIGHT 11/13/2008  . UNSPECIFIED ANEMIA 11/07/2007    Past Surgical History:  Procedure Laterality Date  . Heart Cartherization  2000  . PROSTATECTOMY  1997  . TONSILLECTOMY  1940's    Allergies  Allergen Reactions  . Lactose Intolerance (Gi)   . Sulfonamide Derivatives     REACTION: Itch/Red Spots    Outpatient Encounter Medications as of 07/23/2020  Medication Sig  . Cholecalciferol (VITAMIN D) 2000  UNITS CAPS Take 2 capsules by mouth daily.  . ferrous sulfate 325 (65 FE) MG EC tablet Take 1 tablet (325 mg total) by mouth every other day.  . levothyroxine (SYNTHROID) 25 MCG tablet TAKE 1 TABLET IN THE MORNING BEFORE BREAKFAST.  Marland Kitchen lisinopril (ZESTRIL) 5 MG tablet TAKE 1 TABLET EACH DAY.  Marland Kitchen polycarbophil (FIBERCON) 625 MG tablet Take 625 mg by mouth daily.  . polyethylene glycol (MIRALAX / GLYCOLAX) 17 g packet Take 17 g by mouth daily. Take 1 to 2 capfuls daily at noon  . rosuvastatin (CRESTOR) 40 MG tablet TAKE 1 TABLET EACH DAY.  . traZODone (DESYREL) 100 MG tablet TAKE ONE TABLET AT BEDTIME.   No facility-administered encounter medications on file as of 07/23/2020.    Review of Systems:  Review of Systems  Constitutional:  Negative for chills, fever and malaise/fatigue.  HENT: Positive for hearing loss. Negative for congestion and sore throat.   Eyes: Negative for blurred vision.  Respiratory: Negative for cough and shortness of breath.   Cardiovascular: Positive for leg swelling. Negative for chest pain and palpitations.  Gastrointestinal: Positive for constipation. Negative for abdominal pain, blood in stool, diarrhea and melena.  Genitourinary: Negative for dysuria.  Musculoskeletal: Positive for joint pain. Negative for falls.       Hip  Neurological: Negative for dizziness, loss of consciousness and weakness.       Unsteady gait that's intermittent, no dizziness reported  Endo/Heme/Allergies: Bruises/bleeds easily.  Psychiatric/Behavioral: Positive for memory loss. Negative for depression. The patient is not nervous/anxious and does not have insomnia.     Health Maintenance  Topic Date Due  . FOOT EXAM  05/25/2019  . HEMOGLOBIN A1C  09/08/2020  . COVID-19 Vaccine (4 - Booster for Moderna series) 11/10/2020  . OPHTHALMOLOGY EXAM  06/04/2021  . TETANUS/TDAP  12/03/2026  . INFLUENZA VACCINE  Completed  . PNA vac Low Risk Adult  Completed    Physical Exam: Vitals:   07/23/20 1129  Weight: 200 lb (90.7 kg)  Height: 5\' 11"  (1.803 m)   Body mass index is 27.89 kg/m. Physical Exam Vitals reviewed.  Constitutional:      General: He is not in acute distress.    Appearance: Normal appearance. He is not toxic-appearing.  Eyes:     Conjunctiva/sclera: Conjunctivae normal.     Pupils: Pupils are equal, round, and reactive to light.  Neck:     Vascular: Carotid bruit present.  Cardiovascular:     Rate and Rhythm: Normal rate and regular rhythm.     Pulses: Normal pulses.     Heart sounds: Normal heart sounds.  Pulmonary:     Effort: Pulmonary effort is normal.     Breath sounds: Normal breath sounds. No rales.  Abdominal:     General: Bowel sounds are normal.     Palpations: Abdomen is soft.      Tenderness: There is no abdominal tenderness. There is no guarding or rebound.  Musculoskeletal:        General: Normal range of motion.     Right lower leg: Edema present.     Left lower leg: Edema present.     Comments: Mild nonpitting edema bilaterally; gait unsteady when he arrived, but was better when leaving clinic  Neurological:     General: No focal deficit present.     Mental Status: He is alert and oriented to person, place, and time.     Cranial Nerves: No cranial nerve deficit.  Sensory: Sensory deficit present.     Motor: No weakness.     Gait: Gait abnormal.     Comments: Some mild short-term memory loss--wife helps with history and corrects him when he gives wrong information  Psychiatric:        Mood and Affect: Mood normal.     Labs reviewed: Basic Metabolic Panel: Recent Labs    11/01/19 0700 03/11/20 0000 07/17/20 0000  NA 143 141 142  K 4.5 4.8 4.7  CL 107 106 107  CO2 21 23* 23*  BUN 40* 31* 39*  CREATININE 1.9* 1.8* 2.0*  CALCIUM 8.8 9.3 8.6*  TSH  --  5.32 5.35   Liver Function Tests: Recent Labs    11/01/19 0700 03/11/20 0000 07/17/20 0000  AST 22 21 23   ALT 17 15 13   ALBUMIN 4.2 3.9 4.0   No results for input(s): LIPASE, AMYLASE in the last 8760 hours. No results for input(s): AMMONIA in the last 8760 hours. CBC: Recent Labs    11/01/19 0700 03/11/20 0000 07/17/20 0000  WBC 10.2 8.9 8.8  HGB 13.2* 13.0* 12.8*  HCT 40* 39* 37*  PLT 119* 139* 133*   Lipid Panel: Recent Labs    07/17/20 0000  CHOL 116  HDL 34*  LDLCALC 62  TRIG 104   Lab Results  Component Value Date   HGBA1C 6.4 03/11/2020    Procedures since last visit: No results found.  Assessment/Plan 1. Chronic kidney disease (CKD), stage IV (severe) ([facility]) -stable, hydration encouraged extensively b/c he's not good with this, does avoid nsaids  2. Controlled type 2 diabetes mellitus with complication, without long-term current use of insulin  ([place]) -doing fine here, educated on healthy choices  3. Hypothyroidism, unspecified type -is borderline low with current levothyroxine but I also suspect he may miss it occasionally  Lab Results  Component Value Date   TSH 5.35 07/17/2020   4. Mild cognitive impairment -notable, his wife provides support, reminders about meds, good food choices, encourages exercise -doing fine in [place] with her help -no longer drives  5. Slow transit constipation -improve hydration, fiber with more veggies and salads, cont walking for exercise  Next appt:  11/12/2020  Zofia Peckinpaugh L. Chi Woodham, D.O. [facility] [address], [place] [address] Cell Phone (Mon-Fri 8am-5pm):  [phone] On Call:  [phone] & follow prompts after 5pm & weekends Office Phone:  [phone] Office Fax:  [phone]

## 2020-08-09 ENCOUNTER — Other Ambulatory Visit: Payer: Self-pay | Admitting: Internal Medicine

## 2020-08-18 ENCOUNTER — Encounter: Payer: Self-pay | Admitting: Internal Medicine

## 2020-09-06 ENCOUNTER — Other Ambulatory Visit: Payer: Self-pay | Admitting: Internal Medicine

## 2020-09-09 ENCOUNTER — Telehealth: Payer: Self-pay

## 2020-09-09 ENCOUNTER — Encounter: Payer: Self-pay | Admitting: Family

## 2020-09-09 ENCOUNTER — Ambulatory Visit (INDEPENDENT_AMBULATORY_CARE_PROVIDER_SITE_OTHER): Payer: Medicare Other | Admitting: Family

## 2020-09-09 ENCOUNTER — Other Ambulatory Visit: Payer: Self-pay

## 2020-09-09 DIAGNOSIS — Z0000 Encounter for general adult medical examination without abnormal findings: Secondary | ICD-10-CM | POA: Diagnosis not present

## 2020-09-09 NOTE — Telephone Encounter (Signed)
[name], sebald are scheduled for a virtual visit with your provider today.    Just as we do with appointments in the office, we must obtain your consent to participate.  Your consent will be active for this visit and any virtual visit you may have with one of our providers in the next 365 days.    If you have a MyChart account, I can also send a copy of this consent to you electronically.  All virtual visits are billed to your insurance company just like a traditional visit in the office.  As this is a virtual visit, video technology does not allow for your provider to perform a traditional examination.  This may limit your provider's ability to fully assess your condition.  If your provider identifies any concerns that need to be evaluated in person or the need to arrange testing such as labs, EKG, etc, we will make arrangements to do so.    Although advances in technology are sophisticated, we cannot ensure that it will always work on either your end or our end.  If the connection with a video visit is poor, we may have to switch to a telephone visit.  With either a video or telephone visit, we are not always able to ensure that we have a secure connection.   I need to obtain your verbal consent now.   Are you willing to proceed with your visit today?   [name] has provided verbal consent on 09/09/2020 for a virtual visit (video or telephone).   [name], [name] 09/09/2020  1:00PM

## 2020-09-09 NOTE — Patient Instructions (Signed)
[name] , Thank you for taking time to come for your Medicare Wellness Visit. I appreciate your ongoing commitment to your health goals. Please review the following plan we discussed and let me know if I can assist you in the future.   Screening recommendations/referrals: Colonoscopy: N/A  Recommended yearly ophthalmology/optometry visit for glaucoma screening and checkup Recommended yearly dental visit for hygiene and checkup  Vaccinations: Influenza vaccine: Up to date  Pneumococcal vaccine : Up to date  Tdap vaccine : Up to date  Shingles vaccine : Up to date   Advanced directives: Yes   Conditions/risks identified: Advance Age male > 12 yrs,Dyslipidemia,Hypertension,male gender,hx of smoking    Next appointment: 1 yr   Preventive Care 20 Years and Older, Male Preventive care refers to lifestyle choices and visits with your health care provider that can promote health and wellness. What does preventive care include?  A yearly physical exam. This is also called an annual well check.  Dental exams once or twice a year.  Routine eye exams. Ask your health care provider how often you should have your eyes checked.  Personal lifestyle choices, including:  Daily care of your teeth and gums.  Regular physical activity.  Eating a healthy diet.  Avoiding tobacco and drug use.  Limiting alcohol use.  Practicing safe sex.  Taking low doses of aspirin every day.  Taking vitamin and mineral supplements as recommended by your health care provider. What happens during an annual well check? The services and screenings done by your health care provider during your annual well check will depend on your age, overall health, lifestyle risk factors, and family history of disease. Counseling  Your health care provider may ask you questions about your:  Alcohol use.  Tobacco use.  Drug use.  Emotional well-being.  Home and relationship well-being.  Sexual  activity.  Eating habits.  History of falls.  Memory and ability to understand (cognition).  Work and work Statistician. Screening  You may have the following tests or measurements:  Height, weight, and BMI.  Blood pressure.  Lipid and cholesterol levels. These may be checked every 5 years, or more frequently if you are over 30 years old.  Skin check.  Lung cancer screening. You may have this screening every year starting at age 55 if you have a 30-pack-year history of smoking and currently smoke or have quit within the past 15 years.  Fecal occult blood test (FOBT) of the stool. You may have this test every year starting at age 82.  Flexible sigmoidoscopy or colonoscopy. You may have a sigmoidoscopy every 5 years or a colonoscopy every 10 years starting at age 74.  Prostate cancer screening. Recommendations will vary depending on your family history and other risks.  Hepatitis C blood test.  Hepatitis B blood test.  Sexually transmitted disease (STD) testing.  Diabetes screening. This is done by checking your blood sugar (glucose) after you have not eaten for a while (fasting). You may have this done every 1-3 years.  Abdominal aortic aneurysm (AAA) screening. You may need this if you are a current or former smoker.  Osteoporosis. You may be screened starting at age 50 if you are at high risk. Talk with your health care provider about your test results, treatment options, and if necessary, the need for more tests. Vaccines  Your health care provider may recommend certain vaccines, such as:  Influenza vaccine. This is recommended every year.  Tetanus, diphtheria, and acellular pertussis (Tdap, Td) vaccine.  You may need a Td booster every 10 years.  Zoster vaccine. You may need this after age 93.  Pneumococcal 13-valent conjugate (PCV13) vaccine. One dose is recommended after age 70.  Pneumococcal polysaccharide (PPSV23) vaccine. One dose is recommended after age  32. Talk to your health care provider about which screenings and vaccines you need and how often you need them. This information is not intended to replace advice given to you by your health care provider. Make sure you discuss any questions you have with your health care provider. Document Released: 07/11/2015 Document Revised: 03/03/2016 Document Reviewed: 04/15/2015 Elsevier Interactive Patient Education  2017 [place] Prevention in the Home Falls can cause injuries. They can happen to people of all ages. There are many things you can do to make your home safe and to help prevent falls. What can I do on the outside of my home?  Regularly fix the edges of walkways and driveways and fix any cracks.  Remove anything that might make you trip as you walk through a door, such as a raised step or threshold.  Trim any bushes or trees on the path to your home.  Use bright outdoor lighting.  Clear any walking paths of anything that might make someone trip, such as rocks or tools.  Regularly check to see if handrails are loose or broken. Make sure that both sides of any steps have handrails.  Any raised decks and porches should have guardrails on the edges.  Have any leaves, snow, or ice cleared regularly.  Use sand or salt on walking paths during winter.  Clean up any spills in your garage right away. This includes oil or grease spills. What can I do in the bathroom?  Use night lights.  Install grab bars by the toilet and in the tub and shower. Do not use towel bars as grab bars.  Use non-skid mats or decals in the tub or shower.  If you need to sit down in the shower, use a plastic, non-slip stool.  Keep the floor dry. Clean up any water that spills on the floor as soon as it happens.  Remove soap buildup in the tub or shower regularly.  Attach bath mats securely with double-sided non-slip rug tape.  Do not have throw rugs and other things on the floor that can make  you trip. What can I do in the bedroom?  Use night lights.  Make sure that you have a light by your bed that is easy to reach.  Do not use any sheets or blankets that are too big for your bed. They should not hang down onto the floor.  Have a firm chair that has side arms. You can use this for support while you get dressed.  Do not have throw rugs and other things on the floor that can make you trip. What can I do in the kitchen?  Clean up any spills right away.  Avoid walking on wet floors.  Keep items that you use a lot in easy-to-reach places.  If you need to reach something above you, use a strong step stool that has a grab bar.  Keep electrical cords out of the way.  Do not use floor polish or wax that makes floors slippery. If you must use wax, use non-skid floor wax.  Do not have throw rugs and other things on the floor that can make you trip. What can I do with my stairs?  Do not leave any  items on the stairs.  Make sure that there are handrails on both sides of the stairs and use them. Fix handrails that are broken or loose. Make sure that handrails are as long as the stairways.  Check any carpeting to make sure that it is firmly attached to the stairs. Fix any carpet that is loose or worn.  Avoid having throw rugs at the top or bottom of the stairs. If you do have throw rugs, attach them to the floor with carpet tape.  Make sure that you have a light switch at the top of the stairs and the bottom of the stairs. If you do not have them, ask someone to add them for you. What else can I do to help prevent falls?  Wear shoes that:  Do not have high heels.  Have rubber bottoms.  Are comfortable and fit you well.  Are closed at the toe. Do not wear sandals.  If you use a stepladder:  Make sure that it is fully opened. Do not climb a closed stepladder.  Make sure that both sides of the stepladder are locked into place.  Ask someone to hold it for you, if  possible.  Clearly mark and make sure that you can see:  Any grab bars or handrails.  First and last steps.  Where the edge of each step is.  Use tools that help you move around (mobility aids) if they are needed. These include:  Canes.  Walkers.  Scooters.  Crutches.  Turn on the lights when you go into a dark area. Replace any light bulbs as soon as they burn out.  Set up your furniture so you have a clear path. Avoid moving your furniture around.  If any of your floors are uneven, fix them.  If there are any pets around you, be aware of where they are.  Review your medicines with your doctor. Some medicines can make you feel dizzy. This can increase your chance of falling. Ask your doctor what other things that you can do to help prevent falls. This information is not intended to replace advice given to you by your health care provider. Make sure you discuss any questions you have with your health care provider. Document Released: 04/10/2009 Document Revised: 11/20/2015 Document Reviewed: 07/19/2014 Elsevier Interactive Patient Education  2017 [employer].

## 2020-09-09 NOTE — Progress Notes (Signed)
This service is provided via telemedicine  No vital signs collected/recorded due to the encounter was a telemedicine visit.   Location of patient (ex: home, work): Home.  Patient consents to a telephone visit: Yes.  Location of the provider (ex: office, home): [facility].  Name of any referring provider:Reed, Tiffany L, DO   Names of all persons participating in the telemedicine service and their role in the encounter: Patient, [name], [name], [name], Gerald Silversmith, NP.    Time spent on call: 8 minutes spent on the phone with Medical Assistant.      Subjective:   [name] is a 87 y.o. male who presents for Medicare Annual/Subsequent preventive examination.  Review of Systems     Cardiac Risk Factors include: advanced age (>77men, >56 women);dyslipidemia;hypertension;male gender;smoking/ tobacco exposure     Objective:    There were no vitals filed for this visit. There is no height or weight on file to calculate BMI.  Advanced Directives 09/09/2020 07/23/2020 06/04/2020 03/19/2020 11/14/2019 09/06/2019 08/08/2019  Does Patient Have a Medical Advance Directive? Yes Yes Yes Yes Yes Yes Yes  Type of Paramedic of [place];Living will Out of facility DNR (pink MOST or yellow form) Out of facility DNR (pink MOST or yellow form) Out of facility DNR (pink MOST or yellow form);Living will Out of facility DNR (pink MOST or yellow form);Living will [place];Living will [place]  Does patient want to make changes to medical advance directive? No - Patient declined No - Patient declined No - Patient declined No - Patient declined No - Patient declined No - Patient declined No - Patient declined  Copy of [place] in Chart? Yes - validated most recent copy scanned in chart (See row information) - - - - Yes - validated most recent copy scanned in chart (See row information) -  Would patient like  information on creating a medical advance directive? - - - - - - -  Pre-existing out of facility DNR order (yellow form or pink MOST form) - Pink MOST/Yellow Form most recent copy in chart - Physician notified to receive inpatient order Pink MOST/Yellow Form most recent copy in chart - Physician notified to receive inpatient order Pink MOST/Yellow Form most recent copy in chart - Physician notified to receive inpatient order - - -    Current Medications (verified) Outpatient Encounter Medications as of 09/09/2020  Medication Sig  . Cholecalciferol (VITAMIN D) 2000 UNITS CAPS Take 2 capsules by mouth daily.  . ferrous sulfate 325 (65 FE) MG EC tablet Take 1 tablet (325 mg total) by mouth every other day.  . levothyroxine (SYNTHROID) 25 MCG tablet TAKE 1 TABLET IN THE MORNING BEFORE BREAKFAST.  Marland Kitchen lisinopril (ZESTRIL) 5 MG tablet TAKE 1 TABLET EACH DAY.  Marland Kitchen polycarbophil (FIBERCON) 625 MG tablet Take 625 mg by mouth daily.  . polyethylene glycol (MIRALAX / GLYCOLAX) 17 g packet Take 17 g by mouth daily. Take 1 to 2 capfuls daily at noon  . rosuvastatin (CRESTOR) 40 MG tablet TAKE 1 TABLET EACH DAY.  . traZODone (DESYREL) 100 MG tablet TAKE ONE TABLET AT BEDTIME.   No facility-administered encounter medications on file as of 09/09/2020.    Allergies (verified) Lactose intolerance (gi), Sulfa antibiotics, and Sulfonamide derivatives   History: Past Medical History:  Diagnosis Date  . Anxiety   . BRADYCARDIA 11/13/2008  . Cataract   . Cholelithiasis   . COLONIC POLYPS, HX  OF 02/04/2007  . DEPRESSION 02/04/2007  . DIABETES MELLITUS, TYPE II 02/04/2007  . DIVERTICULOSIS, COLON 02/04/2007  . FOREIGN BODY, ASPIRATION 12/04/2007  . GERD (gastroesophageal reflux disease)   . GLAUCOMA 03/08/2009  . Hepatitis A 1963  . HYPERLIPIDEMIA 12/04/2007  . HYPERTENSION 02/04/2007  . Memory loss   . OBSTRUCTIVE SLEEP APNEA 11/05/2009   -PSG 01/05/10 RDI 11, PLMI 56  . OSTEOARTHRITIS 02/04/2007  . Peptic stricture of  esophagus   . PROSTATE CANCER 1997  . RENAL INSUFFICIENCY 11/13/2008  . SYNCOPE 12/31/2009  . THROMBOCYTOPENIA 04/17/2008  . THYROID NODULE, RIGHT 11/13/2008  . UNSPECIFIED ANEMIA 11/07/2007   Past Surgical History:  Procedure Laterality Date  . Heart Cartherization  2000  . PROSTATECTOMY  1997  . TONSILLECTOMY  1940's   Family History  Problem Relation Age of Onset  . Kidney disease Father        Bright's Disease - died at age 59  . Other Mother        no signifincant health problems - died at age 64  . Heart disease Neg Hx        No FH of Coronary Artery Disease  . Cancer Neg Hx   . Colon cancer Neg Hx   . Esophageal cancer Neg Hx   . Inflammatory bowel disease Neg Hx   . Liver disease Neg Hx   . Pancreatic cancer Neg Hx   . Rectal cancer Neg Hx   . Stomach cancer Neg Hx    Social History   Socioeconomic History  . Marital status: Married    Spouse name: [name]  . Number of children: 3  . Years of education: college  . Highest education level: Bachelor's degree (e.g., BA, AB, BS)  Occupational History  . Occupation: Immunologist    Comment: Retired  Tobacco Use  . Smoking status: Former Smoker    Types: Cigarettes, Pipe    Quit date: 07/08/1993    Years since quitting: 27.1  . Smokeless tobacco: Never Used  Vaping Use  . Vaping Use: Never used  Substance and Sexual Activity  . Alcohol use: Yes    Comment: minimal  . Drug use: No  . Sexual activity: Not on file  Other Topics Concern  . Not on file  Social History Narrative   He played tennis 3x a week before moving to [place].   Right-handed.   2 cups caffeine per day.      Social Determinants of Health   Financial Resource Strain: Not on file  Food Insecurity: Not on file  Transportation Needs: Not on file  Physical Activity: Not on file  Stress: Not on file  Social Connections: Not on file    Tobacco Counseling Counseling given: Not Answered   Clinical  Intake:  Pre-visit preparation completed: No  Pain : No/denies pain     BMI - recorded: 27.91 Nutritional Status: BMI 25 -29 Overweight Nutritional Risks: None Diabetes: No  How often do you need to have someone help you when you read instructions, pamphlets, or other written materials from your doctor or pharmacy?: 1 - Never What is the last grade level you completed in school?: [place]   Interpreter Needed?: No  Information entered by :: Bush Ngetich,FNP-C   Activities of Daily Living In your present state of health, do you have any difficulty performing the following activities: 09/09/2020  Hearing? Y  Vision? N  Difficulty concentrating or making decisions? N  Walking or climbing stairs?  Y  Comment right knee  Dressing or bathing? N  Doing errands, shopping? Y  Comment wife drives  Conservation officer, nature and eating ? N  Using the Toilet? N  In the past six months, have you accidently leaked urine? N  Do you have problems with loss of bowel control? N  Managing your Medications? N  Managing your Finances? N  Housekeeping or managing your Housekeeping? N  Some recent data might be hidden    Patient Care Team: Gayland Curry, DO as PCP - General (Geriatric Medicine) Sherren Mocha, MD as PCP - Cardiology (Cardiology) Nobie Putnam, MD (Hematology and Oncology) Martinique, Amy, MD as Consulting Physician (Dermatology)  Indicate any recent Medical Services you may have received from other than [facility] providers in the past year (date may be approximate).     Assessment:   This is a routine wellness examination for [place].  Hearing/Vision screen  Hearing Screening   125Hz  250Hz  500Hz  1000Hz  2000Hz  3000Hz  4000Hz  6000Hz  8000Hz   Right ear:           Left ear:           Comments: Some hearing concerns. Patient wears hearing aids.   Vision Screening Comments: No Vision Concerns. Patient wears prescription glasses. Patient last eye exam 2021  Dietary issues and  exercise activities discussed: Current Exercise Habits: Home exercise routine, Type of exercise: walking, Time (Minutes): 30, Frequency (Times/Week): 7, Weekly Exercise (Minutes/Week): 210, Intensity: Moderate, Exercise limited by: None identified  Goals   None    Depression Screen PHQ 2/9 Scores 09/09/2020 07/23/2020 06/04/2020 03/19/2020 09/06/2019 08/08/2019 01/22/2019  PHQ - 2 Score 0 0 0 0 0 0 0  Exception Documentation - - - - - - -    Fall Risk Fall Risk  09/09/2020 07/23/2020 06/04/2020 03/19/2020 11/14/2019  Falls in the past year? 0 0 0 0 0  Number falls in past yr: 0 0 0 0 0  Injury with Fall? 0 0 0 0 0  Risk for fall due to : - - - - -  Follow up - - - - -    [place]:  Any stairs in or around the home? No  If so, are there any without handrails? No  Home free of loose throw rugs in walkways, pet beds, electrical cords, etc? No  Adequate lighting in your home to reduce risk of falls? Yes   ASSISTIVE DEVICES UTILIZED TO PREVENT FALLS:  Life alert? No  Use of a cane, walker or w/c? No  Grab bars in the bathroom? Yes  Shower chair or bench in shower? Yes  Elevated toilet seat or a handicapped toilet? No   TIMED UP AND GO:  Was the test performed? No .  Length of time to ambulate 10 feet: N/A sec.   Gait slow and steady without use of assistive device  Cognitive Function: MMSE - Mini Mental State Exam 03/07/2018  Orientation to time 5  Orientation to Place 5  Registration 3  Attention/ Calculation 5  Recall 2  Language- name 2 objects 2  Language- repeat 1  Language- follow 3 step command 3  Language- read & follow direction 1  Write a sentence 1  Copy design 1  Total score 29     6CIT Screen 09/09/2020 09/06/2019  What Year? 0 points 0 points  What month? 0 points 0 points  What time? 0 points 0 points  Count back from 20 2 points 0  points  Months in reverse 0 points 0 points  Repeat phrase 0 points 0 points  Total Score 2  0    Immunizations Immunization History  Administered Date(s) Administered  . Fluad Quad(high Dose 65+) 02/27/2019  . Influenza Split 04/05/2011, 04/04/2012  . Influenza Whole 03/27/2008, 04/15/2009, 03/20/2010  . Influenza, High Dose Seasonal PF 03/31/2016, 04/06/2017, 03/29/2018, 04/25/2020  . Influenza,inj,Quad PF,6+ Mos 04/03/2013, 03/14/2015  . Influenza-Unspecified 06/29/1999, 03/28/2000, 03/28/2001, 03/29/2003, 04/28/2004, 05/28/2004, 04/25/2005, 04/27/2005, 04/11/2006, 03/29/2007, 05/10/2007, 02/27/2008, 04/07/2011, 04/19/2012, 04/03/2013, 03/28/2014, 02/27/2015, 03/26/2016, 03/20/2017  . Moderna Sars-Covid-2 Vaccination 08/10/2019, 09/06/2019, 05/13/2020  . Pneumococcal Conjugate-13 05/06/2014, 03/14/2015  . Pneumococcal Polysaccharide-23 06/29/1995  . Pneumococcal-Unspecified 08/28/2001  . Td 11/27/1995, 10/26/2004, 11/13/2004  . Tdap 05/06/2014, 12/02/2016  . Zoster 06/28/2004  . Zoster Recombinat (Shingrix) 12/30/2017, 05/22/2018    TDAP status: Up to date  Flu Vaccine status: Up to date  Pneumococcal vaccine status: Up to date  Covid-19 vaccine status: Completed vaccines  Qualifies for Shingles Vaccine? Yes   Zostavax completed Yes   Shingrix Completed?: Yes  Screening Tests Health Maintenance  Topic Date Due  . FOOT EXAM  05/25/2019  . HEMOGLOBIN A1C  09/08/2020  . COVID-19 Vaccine (4 - Booster for Moderna series) 11/10/2020  . OPHTHALMOLOGY EXAM  06/04/2021  . TETANUS/TDAP  12/03/2026  . INFLUENZA VACCINE  Completed  . PNA vac Low Risk Adult  Completed  . HPV VACCINES  Aged Out    Health Maintenance  Health Maintenance Due  Topic Date Due  . FOOT EXAM  05/25/2019  . HEMOGLOBIN A1C  09/08/2020    Colorectal cancer screening: No longer required.   Lung Cancer Screening: (Low Dose CT Chest recommended if Age 64-80 years, 30 pack-year currently smoking OR have quit w/in 15years.) does not qualify.   Lung Cancer Screening Referral: No  Additional  Screening:  Hepatitis C Screening: does not qualify; Completed No   Vision Screening: Recommended annual ophthalmology exams for early detection of glaucoma and other disorders of the eye. Is the patient up to date with their annual eye exam?  Yes  Who is the provider or what is the name of the office in which the patient attends annual eye exams? [facility]  If pt is not established with a provider, would they like to be referred to a provider to establish care? No .   Dental Screening: Recommended annual dental exams for proper oral hygiene  Community Resource Referral / Chronic Care Management: CRR required this visit?  No   CCM required this visit?  No     Plan:    - Due for Foot exam but declines trims own toenail.   I have personally reviewed and noted the following in the patient's chart:   . Medical and social history . Use of alcohol, tobacco or illicit drugs  . Current medications and supplements . Functional ability and status . Nutritional status . Physical activity . Advanced directives . List of other physicians . Hospitalizations, surgeries, and ER visits in previous 12 months . Vitals . Screenings to include cognitive, depression, and falls . Referrals and appointments  In addition, I have reviewed and discussed with patient certain preventive protocols, quality metrics, and best practice recommendations. A written personalized care plan for preventive services as well as general preventive health recommendations were provided to patient.    Sandrea Hughs, NP   09/09/2020   Nurse Notes:declined referral to Podiatrist for foot exam

## 2020-09-24 DIAGNOSIS — D2261 Melanocytic nevi of right upper limb, including shoulder: Secondary | ICD-10-CM | POA: Diagnosis not present

## 2020-09-24 DIAGNOSIS — L570 Actinic keratosis: Secondary | ICD-10-CM | POA: Diagnosis not present

## 2020-09-24 DIAGNOSIS — L720 Epidermal cyst: Secondary | ICD-10-CM | POA: Diagnosis not present

## 2020-09-24 DIAGNOSIS — L821 Other seborrheic keratosis: Secondary | ICD-10-CM | POA: Diagnosis not present

## 2020-09-24 DIAGNOSIS — D692 Other nonthrombocytopenic purpura: Secondary | ICD-10-CM | POA: Diagnosis not present

## 2020-09-24 DIAGNOSIS — Z85828 Personal history of other malignant neoplasm of skin: Secondary | ICD-10-CM | POA: Diagnosis not present

## 2020-09-24 DIAGNOSIS — D2239 Melanocytic nevi of other parts of face: Secondary | ICD-10-CM | POA: Diagnosis not present

## 2020-09-24 DIAGNOSIS — D485 Neoplasm of uncertain behavior of skin: Secondary | ICD-10-CM | POA: Diagnosis not present

## 2020-09-24 DIAGNOSIS — L820 Inflamed seborrheic keratosis: Secondary | ICD-10-CM | POA: Diagnosis not present

## 2020-09-24 DIAGNOSIS — C44719 Basal cell carcinoma of skin of left lower limb, including hip: Secondary | ICD-10-CM | POA: Diagnosis not present

## 2020-09-24 DIAGNOSIS — D225 Melanocytic nevi of trunk: Secondary | ICD-10-CM | POA: Diagnosis not present

## 2020-09-27 DIAGNOSIS — Z23 Encounter for immunization: Secondary | ICD-10-CM | POA: Diagnosis not present

## 2020-09-28 ENCOUNTER — Telehealth: Payer: Self-pay | Admitting: Gastroenterology

## 2020-09-28 NOTE — Telephone Encounter (Signed)
Spoke with the patient and his wife on the phone.  He had stopped his MiraLAX and is now constipated.  Has not had a good bowel movement since Friday.  He thinks he has a "blockage".  No abdominal pain or distention.  No nausea or vomiting.  Has the sensation to move his bowels, but does not want to strain.  Has taken 2 doses of MiraLAX and some prune juice today.  I have instructed that he could try over-the-counter fleets enema or suppositories.  He believes he will get an enema and try that today.  Also instructed then to get things moving better from above he could do several more doses of MiraLAX throughout the course of the day.  Advised that then he would need to be using his MiraLAX once or twice daily from here on out as was previously instructed.

## 2020-09-29 NOTE — Telephone Encounter (Signed)
Agree with plan of action. Patty, please check in with patient in a couple of days to see how Miralax has done. Thanks. GM

## 2020-10-07 ENCOUNTER — Telehealth: Payer: Self-pay | Admitting: Gastroenterology

## 2020-10-07 NOTE — Telephone Encounter (Signed)
The pt has called for chronic constipation he has not been taking his miralax daily as prescribed.  He is not drinking enough water.  He has small hard stool that is difficult to pass.  He has been instructed to do an enema and begin taking 2 doses of miralax daily after and keep 5/11 appt with Dr Rush Landmark.  The pt has been advised of the information and verbalized understanding.

## 2020-10-07 NOTE — Telephone Encounter (Signed)
Pt's spouse is requesting a call back from a nurse to discuss the pt's constipation, caller states the pt needs to be seen ASAP but no appts are available.

## 2020-11-05 ENCOUNTER — Encounter: Payer: Self-pay | Admitting: Gastroenterology

## 2020-11-05 ENCOUNTER — Ambulatory Visit (INDEPENDENT_AMBULATORY_CARE_PROVIDER_SITE_OTHER): Payer: Medicare Other | Admitting: Gastroenterology

## 2020-11-05 VITALS — BP 138/66 | HR 59 | Ht 71.0 in | Wt 195.0 lb

## 2020-11-05 DIAGNOSIS — R933 Abnormal findings on diagnostic imaging of other parts of digestive tract: Secondary | ICD-10-CM | POA: Diagnosis not present

## 2020-11-05 DIAGNOSIS — K5909 Other constipation: Secondary | ICD-10-CM

## 2020-11-05 NOTE — Patient Instructions (Addendum)
Miralax- dissolve 1 capful in at least 8 ounces of water/juice at 9:00am and again between 12:30pm-1:00pm if you have not had a loose bowel movement.   Samples of Linzess 16mcg - Take once daily ( only if Miralax does not help).   Please keep follow-up appointment in 6-8 weeks: 12/30/20 @ 10:30am  If you are age 85 or older, your body mass index should be between 23-30. Your Body mass index is 27.2 kg/m. If this is out of the aforementioned range listed, please consider follow up with your Primary Care Provider.  If you are age 48 or younger, your body mass index should be between 19-25. Your Body mass index is 27.2 kg/m. If this is out of the aformentioned range listed, please consider follow up with your Primary Care Provider.    Thank you for choosing me and [facility].  Dr. Rush Landmark

## 2020-11-11 ENCOUNTER — Encounter: Payer: Self-pay | Admitting: Gastroenterology

## 2020-11-11 NOTE — Progress Notes (Signed)
[name] VISIT   Primary Care Provider Gerald Dad, MD [place] [address] [phone]  Patient Profile: [name] is a 87 y.o. male with a pmh significant for Diverticulosis, Prior Colon Polyps, incomplete colonoscopy, HTN, HLD, Cholelithiasis, MDD/Anxiety, DM, GERD, Prostate Cancer, OSA, Glaucoma, CRI, Self-Reported Lactose Intolerance chronic constipation.  The patient presents to the [name] for an evaluation and management of problem(s) noted below:  Problem List 1. Chronic constipation   2. Abnormal colonoscopy     History of Present Illness: Please see prior progress notes from PA [name], myself, PA [name] for full details of HPI.  Interval History: The patient returns for scheduled follow-up and is accompanied by his wife.  Patient still having issues of abdominal gas and explosive gas that occurs a few times per day.  Patient still dealing with constipation.  If he takes MiraLAX on a scheduled basis in the morning and takes 1-2 more doses during the day he will have between 2 and 4 formed bowel movements.  If he does not take this or drink enough fluids he continues to have constipation.  He did need an enema to help him get through issues that were occurring a few weeks back.  Patient feels that if he can remember to take this on a regular basis he may be able to do better.  He denies any overt hematochezia.  Still has some straining.    GI Review of Systems Positive as above Negative for dysphagia, odynophagia, nausea, vomiting, pain, melena   Review of Systems General: Denies fevers/chills/unintentional weight loss Cardiovascular: Denies chest pain Pulmonary: Denies shortness of breath Gastroenterological: See HPI Dermatological: Denies jaundice Psychological: Mood is stable   Medications Current Outpatient Medications  Medication Sig Dispense Refill  . Cholecalciferol (VITAMIN D)  2000 UNITS CAPS Take 2 capsules by mouth daily.    . ferrous sulfate 325 (65 FE) MG EC tablet Take 1 tablet (325 mg total) by mouth every other day. 15 tablet 3  . levothyroxine (SYNTHROID) 25 MCG tablet TAKE 1 TABLET IN THE MORNING BEFORE BREAKFAST. 90 tablet 1  . lisinopril (ZESTRIL) 5 MG tablet TAKE 1 TABLET EACH DAY. 90 tablet 0  . polycarbophil (FIBERCON) 625 MG tablet Take 625 mg by mouth daily.    . polyethylene glycol (MIRALAX / GLYCOLAX) 17 g packet Take 17 g by mouth daily. Take 1 to 2 capfuls daily at noon    . rosuvastatin (CRESTOR) 40 MG tablet TAKE 1 TABLET EACH DAY. 90 tablet 0  . traZODone (DESYREL) 100 MG tablet TAKE ONE TABLET AT BEDTIME. 30 tablet 11   No current facility-administered medications for this visit.    Allergies Allergies  Allergen Reactions  . Lactose Intolerance (Gi)   . Sulfa Antibiotics     Other reaction(s): RASH,PAPULAR  . Sulfonamide Derivatives     REACTION: Itch/Red Spots    Histories Past Medical History:  Diagnosis Date  . Anxiety   . BRADYCARDIA 11/13/2008  . Cataract   . Cholelithiasis   . COLONIC POLYPS, HX OF 02/04/2007  . DEPRESSION 02/04/2007  . DIABETES MELLITUS, TYPE II 02/04/2007  . DIVERTICULOSIS, COLON 02/04/2007  . FOREIGN BODY, ASPIRATION 12/04/2007  . GERD (gastroesophageal reflux disease)   . GLAUCOMA 03/08/2009  . Hepatitis A 1963  . HYPERLIPIDEMIA 12/04/2007  . HYPERTENSION 02/04/2007  . Memory loss   . OBSTRUCTIVE SLEEP APNEA 11/05/2009   -PSG 01/05/10 RDI 11, PLMI 56  . OSTEOARTHRITIS 02/04/2007  .  Peptic stricture of esophagus   . PROSTATE CANCER 1997  . RENAL INSUFFICIENCY 11/13/2008  . SYNCOPE 12/31/2009  . THROMBOCYTOPENIA 04/17/2008  . THYROID NODULE, RIGHT 11/13/2008  . UNSPECIFIED ANEMIA 11/07/2007   Past Surgical History:  Procedure Laterality Date  . Heart Cartherization  2000  . PROSTATECTOMY  1997  . TONSILLECTOMY  1940's   Social History   Socioeconomic History  . Marital status: Married    Spouse name:  [name]  . Number of children: 3  . Years of education: college  . Highest education level: Bachelor's degree (e.g., BA, AB, BS)  Occupational History  . Occupation: Immunologist    Comment: Retired  Tobacco Use  . Smoking status: Former Smoker    Types: Cigarettes, Pipe    Quit date: 07/08/1993    Years since quitting: 27.3  . Smokeless tobacco: Never Used  Vaping Use  . Vaping Use: Never used  Substance and Sexual Activity  . Alcohol use: Yes    Comment: minimal  . Drug use: No  . Sexual activity: Not on file  Other Topics Concern  . Not on file  Social History Narrative   He played tennis 3x a week before moving to [place].   Right-handed.   2 cups caffeine per day.      Social Determinants of Health   Financial Resource Strain: Not on file  Food Insecurity: Not on file  Transportation Needs: Not on file  Physical Activity: Not on file  Stress: Not on file  Social Connections: Not on file  Intimate Partner Violence: Not on file   Family History  Problem Relation Age of Onset  . Kidney disease Father        Bright's Disease - died at age 8  . Other Mother        no signifincant health problems - died at age 74  . Heart disease Neg Hx        No FH of Coronary Artery Disease  . Cancer Neg Hx   . Colon cancer Neg Hx   . Esophageal cancer Neg Hx   . Inflammatory bowel disease Neg Hx   . Liver disease Neg Hx   . Pancreatic cancer Neg Hx   . Rectal cancer Neg Hx   . Stomach cancer Neg Hx    I have reviewed his medical, social, and family history in detail and updated the electronic medical record as necessary.    PHYSICAL EXAMINATION  BP 138/66   Pulse (!) 59   Ht 5\' 11"  (1.803 m)   Wt 195 lb (88.5 kg)   BMI 27.20 kg/m  Wt Readings from Last 3 Encounters:  11/05/20 195 lb (88.5 kg)  07/23/20 200 lb (90.7 kg)  06/11/20 197 lb 3.2 oz (89.4 kg)  GEN: NAD, appears stated age, doesn't appear chronically ill, accompanied by  wife PSYCH: Cooperative, without pressured speech EYE: Conjunctivae pink, sclerae anicteric ENT: Masked CV: Nontachycardic RESP: No audible wheezing GI: NABS, soft, NT/ND, without rebound MSK/EXT: No lower extremity edema SKIN: No jaundice NEURO:  Alert & Oriented x 3, no focal deficits   REVIEW OF DATA  I reviewed the following data at the time of this encounter:  GI Procedures and Studies  We reviewed the December 2019 colonoscopy attempt  - Non-thrombosed internal hemorrhoids found on digital rectal exam. - Diverticulosis in the recto-sigmoid colon, in the sigmoid colon, in the descending colon and in the transverse colon. This led to restriction of the colon  and difficulty with passage of the colonoscope and the pediatric colonoscope. Only traversed this stenosis/region with the adult endoscope. - Incidental mucosal tear in the descending colon in region of a diverticula, this is near the difficult to pass region.. - Normal mucosa in the entire examined colon otherwise. Biopsied to rule out Microscopic colitis. Separate jar of rectal biopsies to rule out proctitis. - Non-bleeding non-thrombosed internal hemorrhoids.  Laboratory Studies  Reviewed in epic  Imaging Studies  No new studies to review   ASSESSMENT  Mr. Trela is a 87 y.o. male with a pmh significant for Diverticulosis, Prior Colon Polyps, incomplete colonoscopy, HTN, HLD, Cholelithiasis, MDD/Anxiety, DM, GERD, Prostate Cancer, OSA, Glaucoma, CRI, Self-Reported Lactose Intolerance.  The patient is seen today for evaluation and management of:  1. Chronic constipation   2. Abnormal colonoscopy    The patient is hemodynamically stable.  His chronic constipation remains.  I still believe that the alteration in his bowel habits is most likely a result of diverticular related narrowing as I can tell from his previous incomplete colonoscopy attempt back in 2019.  And we had not noted any sort of abnormality in the left  colon.  Imaging of the right colon still not seen in years.  We will have the patient take MiraLAX scheduled on a daily basis at 830/9 in the morning (when he takes breakfast) and then she has not had between 1 or 2 bowel movements by 12:48 PM he will take another dose of MiraLAX.  I have 3 discussed with him that he must drink at least 64 to 80 ounces of fluids per day.  We rediscussed toileting techniques.  The patient will be given Linzess samples in an effort of trying to see if this may be helpful for him.  If it is then we may transition the patient to [place].  He will let us know how he does over the course of the coming weeks.  We will see him back in follow-up.  If patient's issues persist we may consider cross-sectional imaging with IV and oral contrast (when the IV contrast shortage is not as much of an issue).  May also consider the role of a Richardson-based colonoscopy with ultraslim colonoscope being available if necessary.  All patient questions were answered to the best of my ability, and the patient agrees to the aforementioned plan of action with follow-up as indicated.   PLAN  Continue FiberCon once daily MiraLAX scheduled daily in the morning with a second dose at lunchtime Continue to promote fluid intake of 64-80 ounces of fluid per day at minimum Linzess 72 mcg samples to be given to patient in effort of trying to see if this may be helpful If patient has persistent symptoms then will consider a CT abdomen/pelvis with IV and oral contrast Any repeat attempted colonoscopy will need to be done in the Richardson-based setting with CO2 and with ultrasound colonoscope and pediatric endoscope availability    No orders of the defined types were placed in this encounter.   New Prescriptions   No medications on file   Modified Medications   No medications on file    Planned Follow Up: No follow-ups on file.   Total Time in Face-to-Face and in Coordination of Care for patient  including independent/personal interpretation/review of prior testing, medical history, examination, medication adjustment, communicating results with the patient directly, and documentation with the EHR is 25 minutes.   Justice Britain, MD [facility] Office # [phone]

## 2020-11-12 ENCOUNTER — Other Ambulatory Visit: Payer: Self-pay

## 2020-11-12 ENCOUNTER — Encounter: Payer: Self-pay | Admitting: Internal Medicine

## 2020-11-12 ENCOUNTER — Non-Acute Institutional Stay: Payer: Medicare Other | Admitting: Internal Medicine

## 2020-11-12 VITALS — BP 138/80 | HR 53 | Temp 97.3°F | Ht 71.0 in | Wt 195.0 lb

## 2020-11-12 DIAGNOSIS — E039 Hypothyroidism, unspecified: Secondary | ICD-10-CM | POA: Diagnosis not present

## 2020-11-12 DIAGNOSIS — K5900 Constipation, unspecified: Secondary | ICD-10-CM

## 2020-11-12 DIAGNOSIS — E118 Type 2 diabetes mellitus with unspecified complications: Secondary | ICD-10-CM | POA: Diagnosis not present

## 2020-11-12 DIAGNOSIS — G3184 Mild cognitive impairment, so stated: Secondary | ICD-10-CM | POA: Diagnosis not present

## 2020-11-12 DIAGNOSIS — G4700 Insomnia, unspecified: Secondary | ICD-10-CM | POA: Diagnosis not present

## 2020-11-12 DIAGNOSIS — I10 Essential (primary) hypertension: Secondary | ICD-10-CM

## 2020-11-12 DIAGNOSIS — N184 Chronic kidney disease, stage 4 (severe): Secondary | ICD-10-CM

## 2020-11-12 DIAGNOSIS — E7849 Other hyperlipidemia: Secondary | ICD-10-CM

## 2020-11-12 DIAGNOSIS — D631 Anemia in chronic kidney disease: Secondary | ICD-10-CM

## 2020-11-12 DIAGNOSIS — F325 Major depressive disorder, single episode, in full remission: Secondary | ICD-10-CM

## 2020-11-12 NOTE — Patient Instructions (Signed)
Take Miralax in the morning with your coffee Can take half packet in the evening and see if it helps Encourage PO fluids

## 2020-11-13 NOTE — Progress Notes (Signed)
Location:  [facility] of Service:  Clinic (12)  Provider:   Code Status: DNR Goals of Care:  Advanced Directives 09/09/2020  Does Patient Have a Medical Advance Directive? Yes  Type of Paramedic of [place];Living will  Does patient want to make changes to medical advance directive? No - Patient declined  Copy of [place] in Chart? Yes - validated most recent copy scanned in chart (See row information)  Would patient like information on creating a medical advance directive? -  Pre-existing out of facility DNR order (yellow form or pink MOST form) -     Chief Complaint  Patient presents with  . Medical Management of Chronic Issues    Patient returns to the clinic for follow up.     HPI: Patient is a 87 y.o. male seen today for medical management of chronic diseases.   Patient has a history of Constipation Seen Dr. Rush Landmark in GI.  Recommended to take MiraLAX twice a day with Linzess.  Patient has not started Linzess yet.  Still trying to figure out the MiraLAX dosing.  He said  2 times a day  was making him go to the bathroom many times  Mild Cognition impairment As noticed by his wife.  Some issues repeating himself.  But very highly functional walks every day.  No falls does not use a cane or walker.  Has given up driving now. Independent in his IADLs  Also Has h/o CKD stage IV, anemia on iron, insomnia, hypothyroidism, hypertension    Past Medical History:  Diagnosis Date  . Anxiety   . BRADYCARDIA 11/13/2008  . Cataract   . Cholelithiasis   . COLONIC POLYPS, HX OF 02/04/2007  . DEPRESSION 02/04/2007  . DIABETES MELLITUS, TYPE II 02/04/2007  . DIVERTICULOSIS, COLON 02/04/2007  . FOREIGN BODY, ASPIRATION 12/04/2007  . GERD (gastroesophageal reflux disease)   . GLAUCOMA 03/08/2009  . Hepatitis A 1963  . HYPERLIPIDEMIA 12/04/2007  . HYPERTENSION 02/04/2007  . Memory loss   . OBSTRUCTIVE SLEEP APNEA  11/05/2009   -PSG 01/05/10 RDI 11, PLMI 56  . OSTEOARTHRITIS 02/04/2007  . Peptic stricture of esophagus   . PROSTATE CANCER 1997  . RENAL INSUFFICIENCY 11/13/2008  . SYNCOPE 12/31/2009  . THROMBOCYTOPENIA 04/17/2008  . THYROID NODULE, RIGHT 11/13/2008  . UNSPECIFIED ANEMIA 11/07/2007    Past Surgical History:  Procedure Laterality Date  . Heart Cartherization  2000  . PROSTATECTOMY  1997  . TONSILLECTOMY  1940's    Allergies  Allergen Reactions  . Lactose Intolerance (Gi)   . Sulfa Antibiotics     Other reaction(s): RASH,PAPULAR  . Sulfonamide Derivatives     REACTION: Itch/Red Spots    Outpatient Encounter Medications as of 11/12/2020  Medication Sig  . Cholecalciferol (VITAMIN D) 2000 UNITS CAPS Take 2 capsules by mouth daily.  . ferrous sulfate 325 (65 FE) MG EC tablet Take 1 tablet (325 mg total) by mouth every other day.  . levothyroxine (SYNTHROID) 25 MCG tablet TAKE 1 TABLET IN THE MORNING BEFORE BREAKFAST.  Marland Kitchen lisinopril (ZESTRIL) 5 MG tablet TAKE 1 TABLET EACH DAY.  Marland Kitchen polycarbophil (FIBERCON) 625 MG tablet Take 625 mg by mouth daily.  . polyethylene glycol (MIRALAX / GLYCOLAX) 17 g packet Take 17 g by mouth daily. Take 1 to 2 capfuls daily at noon  . rosuvastatin (CRESTOR) 40 MG tablet TAKE 1 TABLET EACH DAY.  . traZODone (DESYREL) 100 MG tablet TAKE ONE TABLET  AT BEDTIME.   No facility-administered encounter medications on file as of 11/12/2020.    Review of Systems:  Review of Systems  Constitutional: Negative.   HENT: Negative.   Respiratory: Negative.   Cardiovascular: Positive for leg swelling.  Gastrointestinal: Positive for constipation.  Genitourinary: Positive for frequency.  Musculoskeletal: Positive for arthralgias and back pain.  Skin: Negative.   Neurological: Negative for dizziness.  Psychiatric/Behavioral: Negative.     Health Maintenance  Topic Date Due  . COVID-19 Vaccine (4 - Booster for Moderna series) 08/13/2020  . HEMOGLOBIN A1C   09/08/2020  . FOOT EXAM  09/09/2021 (Originally 05/25/2019)  . INFLUENZA VACCINE  01/26/2021  . OPHTHALMOLOGY EXAM  06/04/2021  . TETANUS/TDAP  12/03/2026  . PNA vac Low Risk Adult  Completed  . HPV VACCINES  Aged Out    Physical Exam: Vitals:   11/12/20 1011  BP: 138/80  Pulse: (!) 53  Temp: (!) 97.3 F (36.3 C)  SpO2: 97%  Weight: 195 lb (88.5 kg)  Height: 5\' 11"  (1.803 m)   Body mass index is 27.2 kg/m. Physical Exam  Constitutional: Oriented to person, place, and time. Well-developed and well-nourished.  HENT:  Head: Normocephalic.  Mouth/Throat: Oropharynx is clear and moist.  Eyes: Pupils are equal, round, and reactive to light.  Neck: Neck supple.  Cardiovascular: Normal rate and normal heart sounds.  No murmur heard. Pulmonary/Chest: Effort normal and breath sounds normal. No respiratory distress. No wheezes. She has no rales.  Abdominal: Soft. Bowel sounds are normal. No distension. There is no tenderness. There is no rebound. Abdominal Hernia present Musculoskeletal: Mild Edema with Chronic Venous changes  Lymphadenopathy: none Neurological: Alert and oriented to person, place, and time.  NO Focal deficits Gait stable.  Skin: Skin is warm and dry.  Psychiatric: Normal mood and affect. Behavior is normal. Thought content normal.    Labs reviewed: Basic Metabolic Panel: Recent Labs    03/11/20 0000 07/17/20 0000  NA 141 142  K 4.8 4.7  CL 106 107  CO2 23* 23*  BUN 31* 39*  CREATININE 1.8* 2.0*  CALCIUM 9.3 8.6*  TSH 5.32 5.35   Liver Function Tests: Recent Labs    03/11/20 0000 07/17/20 0000  AST 21 23  ALT 15 13  ALBUMIN 3.9 4.0   No results for input(s): LIPASE, AMYLASE in the last 8760 hours. No results for input(s): AMMONIA in the last 8760 hours. CBC: Recent Labs    03/11/20 0000 07/17/20 0000  WBC 8.9 8.8  HGB 13.0* 12.8*  HCT 39* 37*  PLT 139* 133*   Lipid Panel: Recent Labs    07/17/20 0000  CHOL 116  HDL 34*  LDLCALC  62  TRIG 104   Lab Results  Component Value Date   HGBA1C 6.4 03/11/2020    Procedures since last visit: No results found.  Assessment/Plan 1. Chronic kidney disease (CKD), stage IV (severe) (HCC) Creat stable Will continue to follow ? Renal Referal if any change  2. Controlled type 2 diabetes mellitus with complication, without long-term current use of insulin (HCC) Not on any meds A1C Repeat  3. Hypothyroidism, unspecified type Repeat TSH  4. Mild cognitive impairment Highly Functional  5. Major depression in remission [facility]) Not on any meds Per Wife does socialize .  No Active symptoms  6. Constipation, unspecified constipation type Told to try 17 gms in the morning and Half in afternoon if better. Also talked about trying Linzess. 7 HLD On Crestor Repeat Lipid panel  8 Hypertension On Lisinopril Creat stable 9 Anemia with CKD On Iron 2/week Hgb stable   Labs/tests ordered:  CBC,CMP,TSH,A1C,lipid pAnel, Vit D level Next appt:  Visit date not found

## 2020-11-17 ENCOUNTER — Other Ambulatory Visit: Payer: Self-pay | Admitting: *Deleted

## 2020-11-17 MED ORDER — LISINOPRIL 5 MG PO TABS: ORAL_TABLET | ORAL | 1 refills | Status: DC

## 2020-11-17 NOTE — Telephone Encounter (Signed)
Pharmacy requested refill

## 2020-11-18 DIAGNOSIS — E118 Type 2 diabetes mellitus with unspecified complications: Secondary | ICD-10-CM | POA: Diagnosis not present

## 2020-11-18 DIAGNOSIS — N184 Chronic kidney disease, stage 4 (severe): Secondary | ICD-10-CM | POA: Diagnosis not present

## 2020-11-18 DIAGNOSIS — E039 Hypothyroidism, unspecified: Secondary | ICD-10-CM | POA: Diagnosis not present

## 2020-11-18 LAB — VITAMIN D 25 HYDROXY (VIT D DEFICIENCY, FRACTURES): Vit D, 25-Hydroxy: 34.2

## 2020-11-18 LAB — HEPATIC FUNCTION PANEL
ALT: 14 (ref 10–40)
AST: 25 (ref 14–40)
Alkaline Phosphatase: 71 (ref 25–125)
Bilirubin, Total: 0.4

## 2020-11-18 LAB — CBC: RBC: 3.98 (ref 3.87–5.11)

## 2020-11-18 LAB — CBC AND DIFFERENTIAL
HCT: 38 — AB (ref 41–53)
Hemoglobin: 12.6 — AB (ref 13.5–17.5)
Platelets: 161 (ref 150–399)
WBC: 8

## 2020-11-18 LAB — HEMOGLOBIN A1C: Hemoglobin A1C: 6.2

## 2020-11-18 LAB — BASIC METABOLIC PANEL
BUN: 36 — AB (ref 4–21)
CO2: 22 (ref 13–22)
Chloride: 109 — AB (ref 99–108)
Creatinine: 2.1 — AB (ref 0.6–1.3)
Glucose: 98
Potassium: 5 (ref 3.4–5.3)
Sodium: 142 (ref 137–147)

## 2020-11-18 LAB — COMPREHENSIVE METABOLIC PANEL
Albumin: 4.1 (ref 3.5–5.0)
Calcium: 9 (ref 8.7–10.7)

## 2020-11-18 LAB — TSH: TSH: 5.72 (ref 0.41–5.90)

## 2020-11-19 ENCOUNTER — Other Ambulatory Visit: Payer: Self-pay | Admitting: *Deleted

## 2020-11-19 MED ORDER — LEVOTHYROXINE SODIUM 25 MCG PO TABS: ORAL_TABLET | ORAL | 1 refills | Status: DC

## 2020-11-19 NOTE — Telephone Encounter (Signed)
Received refill Request from [facility] 

## 2020-11-21 ENCOUNTER — Telehealth: Payer: Self-pay | Admitting: Internal Medicine

## 2020-11-21 NOTE — Telephone Encounter (Signed)
Let him know that all his labs are in Good range. Kidney function is stable No Change from previous result. Cholesterol and A1C are in Good Range also

## 2020-11-21 NOTE — Telephone Encounter (Signed)
Called and discussed with the patient. No further questions.

## 2020-12-02 DIAGNOSIS — L03116 Cellulitis of left lower limb: Secondary | ICD-10-CM | POA: Diagnosis not present

## 2020-12-02 DIAGNOSIS — L0889 Other specified local infections of the skin and subcutaneous tissue: Secondary | ICD-10-CM | POA: Diagnosis not present

## 2020-12-02 DIAGNOSIS — Z85828 Personal history of other malignant neoplasm of skin: Secondary | ICD-10-CM | POA: Diagnosis not present

## 2020-12-23 ENCOUNTER — Other Ambulatory Visit: Payer: Self-pay | Admitting: Internal Medicine

## 2020-12-30 ENCOUNTER — Ambulatory Visit (INDEPENDENT_AMBULATORY_CARE_PROVIDER_SITE_OTHER): Payer: Medicare Other | Admitting: Gastroenterology

## 2020-12-30 ENCOUNTER — Encounter: Payer: Self-pay | Admitting: Gastroenterology

## 2020-12-30 VITALS — BP 122/68 | HR 58 | Ht 71.0 in | Wt 197.0 lb

## 2020-12-30 DIAGNOSIS — K5909 Other constipation: Secondary | ICD-10-CM | POA: Diagnosis not present

## 2020-12-30 NOTE — Patient Instructions (Addendum)
Miralax- dissolve 1 capful in at least 8 ounces of water/juice at 9:00am and again between 12:30pm-1:00pm if you have not had a loose bowel movement.  If after 3 weeks, no change in bowels then use samples of Linzess 55mcg - (Take 1 capsule once daily.)   Samples of Linzess 61mcg - given today.   Please keep follow up appointment on: 03/04/21 @ 1:30pm  Toileting tips to help with your constipation - Drink at least 64-80 ounces of water/liquid per day. - Establish a time to try to move your bowels every day.  For many people, this is after a cup of coffee or after a meal such as breakfast. - Sit all of the way back on the toilet keeping your back fairly straight and while sitting up, try to rest the tops of your forearms on your upper thighs.   - Raising your feet with a step stool/squatty potty can be helpful to improve the angle that allows your stool to pass through the rectum. - Relax the rectum feeling it bulge toward the toilet water.  If you feel your rectum raising toward your body, you are contracting rather than relaxing. - Breathe in and slowly exhale. "Belly breath" by expanding your belly towards your belly button. Keep belly expanded as you gently direct pressure down and back to the anus.  A low pitched GRRR sound can assist with increasing intra-abdominal pressure.  - Repeat 3-4 times. If unsuccessful, contract the pelvic floor to restore normal tone and get off the toilet.  Avoid excessive straining. - To reduce excessive wiping by teaching your anus to normally contract, place hands on outer aspect of knees and resist knee movement outward.  Hold 5-10 second then place hands just inside of knees and resist inward movement of knees.  Hold 5 seconds.  Repeat a few times each way.  Thank you for choosing me and [facility].  Dr. Rush Landmark

## 2020-12-30 NOTE — Progress Notes (Signed)
[place] VISIT   Primary Care Provider Virgie Dad, MD [employer] [place] [id number] [phone]  Patient Profile: [name] is a 87 y.o. male with a pmh significant for Diverticulosis, Prior Colon Polyps, incomplete colonoscopy, HTN, HLD, Cholelithiasis, MDD/Anxiety, DM, GERD, Prostate Cancer, OSA, Glaucoma, CRI, Self-Reported Lactose Intolerance chronic constipation.  The patient presents to the [facility] for an evaluation and management of problem(s) noted below:  Problem List 1. Chronic constipation      History of Present Illness: Please see prior progress notes from PA Jenkins, myself, PA Zehr for full details of HPI.  Interval History: The patient returns for scheduled follow-up without his wife today.  Gas issues are not as much of an issue at this time.  However, he is still dealing with constipation.  He is not taking his MiraLAX on a daily basis or on a scheduled basis because he had concerns of having 2 loose bowel movements occurring.  He has not use the restroom for a few days.  Patient denies any blood in his stools.  He is not taking fiber supplement s at this time.  He is still straining at times.  GI Review of Systems Positive as above Negative for odynophagia, dysphagia, nausea, vomiting, abdominal pain, melena, hematochezia   Review of Systems General: Denies fevers/chills/unintentional weight loss Cardiovascular: Denies chest pain Pulmonary: Denies shortness of breath Gastroenterological: See HPI Dermatological: Denies jaundice Psychological: Mood is stable   Medications Current Outpatient Medications  Medication Sig Dispense Refill   Cholecalciferol (VITAMIN D) 2000 UNITS CAPS Take 2 capsules by mouth daily.     ferrous sulfate 325 (65 FE) MG EC tablet Take 1 tablet (325 mg total) by mouth every other day. 15 tablet 3   levothyroxine (SYNTHROID) 25 MCG tablet Take one tablet by  mouth once daily 30 minutes before breakfast on empty stomach. 90 tablet 1   lisinopril (ZESTRIL) 5 MG tablet Take one tablet by mouth once daily. 90 tablet 1   polycarbophil (FIBERCON) 625 MG tablet Take 625 mg by mouth daily.     polyethylene glycol (MIRALAX / GLYCOLAX) 17 g packet Take 17 g by mouth daily. Take 1 to 2 capfuls daily at noon     rosuvastatin (CRESTOR) 40 MG tablet TAKE 1 TABLET EACH DAY. 90 tablet 0   traZODone (DESYREL) 100 MG tablet TAKE ONE TABLET AT BEDTIME. 30 tablet 11   No current facility-administered medications for this visit.    Allergies Allergies  Allergen Reactions   Lactose Intolerance (Gi)    Sulfa Antibiotics     Other reaction(s): RASH,PAPULAR   Sulfonamide Derivatives     REACTION: Itch/Red Spots    Histories Past Medical History:  Diagnosis Date   Anxiety    BRADYCARDIA 11/13/2008   Cataract    Cholelithiasis    COLONIC POLYPS, HX OF 02/04/2007   DEPRESSION 02/04/2007   DIABETES MELLITUS, TYPE II 02/04/2007   DIVERTICULOSIS, COLON 02/04/2007   FOREIGN BODY, ASPIRATION 12/04/2007   GERD (gastroesophageal reflux disease)    GLAUCOMA 03/08/2009   Hepatitis A 1963   HYPERLIPIDEMIA 12/04/2007   HYPERTENSION 02/04/2007   Memory loss    OBSTRUCTIVE SLEEP APNEA 11/05/2009   -PSG 01/05/10 RDI 11, PLMI 56   OSTEOARTHRITIS 02/04/2007   Peptic stricture of esophagus    PROSTATE CANCER 1997   RENAL INSUFFICIENCY 11/13/2008   SYNCOPE 12/31/2009   THROMBOCYTOPENIA 04/17/2008   THYROID NODULE, RIGHT 11/13/2008   UNSPECIFIED ANEMIA  11/07/2007   Past Surgical History:  Procedure Laterality Date   Heart Cartherization  2000   PROSTATECTOMY  1997   TONSILLECTOMY  1940's   Social History   Socioeconomic History   Marital status: Married    Spouse name: [name]   Number of children: 3   Years of education: college   Highest education level: Bachelor's degree (e.g., BA, AB, BS)  Occupational History   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   Right-handed.   2 cups caffeine per day.      Social Determinants of Health   Financial Resource Strain: Not on file  Food Insecurity: Not on file  Transportation Needs: Not on file  Physical Activity: Not on file  Stress: Not on file  Social Connections: Not on file  Intimate Partner Violence: Not on file   Family History  Problem Relation Age of Onset   Kidney disease Father        Bright's Disease - died at age 61   Other Mother        no signifincant health problems - died at age 67   Heart disease Neg Hx        No FH of Coronary Artery Disease   Cancer Neg Hx    Colon cancer Neg Hx    Esophageal cancer Neg Hx    Inflammatory bowel disease Neg Hx    Liver disease Neg Hx    Pancreatic cancer Neg Hx    Rectal cancer Neg Hx    Stomach cancer Neg Hx    I have reviewed his medical, social, and family history in detail and updated the electronic medical record as necessary.    PHYSICAL EXAMINATION  BP 122/68 (BP Location: Left Arm, Patient Position: Sitting, Cuff Size: Normal)   Pulse (!) 58   Ht 5\' 11"  (1.803 m)   Wt 197 lb (89.4 kg)   SpO2 98%   BMI 27.48 kg/m  Wt Readings from Last 3 Encounters:  12/30/20 197 lb (89.4 kg)  11/12/20 195 lb (88.5 kg)  11/05/20 195 lb (88.5 kg)  GEN: NAD, appears stated age, doesn't appear chronically ill PSYCH: Cooperative, without pressured speech EYE: Conjunctivae pink, sclerae anicteric ENT: Moist mucous membranes CV: Nontachycardic RESP: No audible  wheezing GI: NABS, soft, NT/ND, without rebound MSK/EXT: No lower extremity edema SKIN: No jaundice NEURO:  Alert & Oriented x 3, no focal deficits   REVIEW OF DATA  I reviewed the following data at the time of this encounter:  GI Procedures and Studies  Previously reviewed  Laboratory Studies  Reviewed in epic  Imaging Studies  No new studies to review   ASSESSMENT  Mr. Diemer is a 87 y.o. male with a pmh significant for Diverticulosis, Prior Colon Polyps, incomplete colonoscopy, HTN, HLD, Cholelithiasis, MDD/Anxiety, DM, GERD, Prostate Cancer, OSA, Glaucoma, CRI, Self-Reported Lactose Intolerance.  The patient is seen today for  evaluation and management of:  1. Chronic constipation    The patient is hemodynamically stable.  Chronic constipation remains an issue but he has not been great in regards to taking his MiraLAX on a scheduled basis.  I believe the alteration of his bowel habits remains a result of diverticular narrowing.  He must drink at least 64 to 80 ounces of fluids per day, he is not doing that currently.  I have asked the patient to try to take MiraLAX daily in the morning and by 12 PM if he has not had a bowel movement he should take a second dose of MiraLAX.  He is going to try this for the next 3 weeks.  If this does not work then we will consider a trial of the Linzess samples (72 mcg) that he was given during his last clinic visit.  He will let us know how he is done in 4 to 6 weeks.  We once again we discussed toileting techniques and have placed that information into his visit summary.  We will see how he is doing and follow-up in 6 to 8 weeks.  If the patient's issues persist we may consider cross-sectional CT with IV contrast or potentially a hospital-based ultraslim colonoscope colonoscopy attempt.    All patient questions were answered to the best of my ability, and the patient agrees to the aforementioned plan of action with follow-up as indicated.   PLAN   Continue FiberCon once daily MiraLAX scheduled daily in the morning with a second dose at lunchtime if he has not had a bowel movement Continue to promote fluid intake of 64-80 ounces of fluid per day at minimum Toileting techniques we discussed If above therapies continue to cause patient to have persistent chronic constipation then will trial the previously given Linzess 72 mcg samples  If patient has persistent symptoms then will consider a CT abdomen/pelvis with IV and oral contrast Any repeat attempted colonoscopy will need to be done in the hospital-based setting with CO2 and with ultrasound colonoscope and pediatric endoscope availability    No orders of the defined types were placed in this encounter.   New Prescriptions   No medications on file   Modified Medications   No medications on file    Planned Follow Up: No follow-ups on file.   Total Time in Face-to-Face and in Coordination of Care for patient including independent/personal interpretation/review of prior testing, medical history, examination, medication adjustment, communicating results with the patient directly, and documentation with the EHR is 25 minutes.   Justice Britain, MD [facility] Office # [phone]

## 2021-01-02 DIAGNOSIS — D692 Other nonthrombocytopenic purpura: Secondary | ICD-10-CM | POA: Diagnosis not present

## 2021-01-02 DIAGNOSIS — Z85828 Personal history of other malignant neoplasm of skin: Secondary | ICD-10-CM | POA: Diagnosis not present

## 2021-01-02 DIAGNOSIS — L570 Actinic keratosis: Secondary | ICD-10-CM | POA: Diagnosis not present

## 2021-01-02 DIAGNOSIS — L720 Epidermal cyst: Secondary | ICD-10-CM | POA: Diagnosis not present

## 2021-02-14 ENCOUNTER — Other Ambulatory Visit: Payer: Self-pay | Admitting: Internal Medicine

## 2021-03-04 ENCOUNTER — Ambulatory Visit: Payer: Medicare Other | Admitting: Gastroenterology

## 2021-03-20 ENCOUNTER — Other Ambulatory Visit: Payer: Self-pay | Admitting: Internal Medicine

## 2021-03-30 ENCOUNTER — Emergency Department (HOSPITAL_BASED_OUTPATIENT_CLINIC_OR_DEPARTMENT_OTHER)
Admission: EM | Admit: 2021-03-30 | Discharge: 2021-03-30 | Disposition: A | Discharge status: Home / Self Care | Payer: Medicare Other | Attending: Emergency Medicine | Admitting: Emergency Medicine

## 2021-03-30 ENCOUNTER — Telehealth: Payer: Self-pay

## 2021-03-30 ENCOUNTER — Other Ambulatory Visit: Payer: Self-pay

## 2021-03-30 ENCOUNTER — Emergency Department (HOSPITAL_COMMUNITY): Payer: Medicare Other

## 2021-03-30 ENCOUNTER — Encounter (HOSPITAL_BASED_OUTPATIENT_CLINIC_OR_DEPARTMENT_OTHER): Payer: Self-pay | Admitting: Emergency Medicine

## 2021-03-30 ENCOUNTER — Emergency Department (HOSPITAL_BASED_OUTPATIENT_CLINIC_OR_DEPARTMENT_OTHER): Payer: Medicare Other

## 2021-03-30 DIAGNOSIS — Z87891 Personal history of nicotine dependence: Secondary | ICD-10-CM | POA: Diagnosis not present

## 2021-03-30 DIAGNOSIS — N183 Chronic kidney disease, stage 3 unspecified: Secondary | ICD-10-CM | POA: Diagnosis not present

## 2021-03-30 DIAGNOSIS — Z20822 Contact with and (suspected) exposure to covid-19: Secondary | ICD-10-CM | POA: Insufficient documentation

## 2021-03-30 DIAGNOSIS — I129 Hypertensive chronic kidney disease with stage 1 through stage 4 chronic kidney disease, or unspecified chronic kidney disease: Secondary | ICD-10-CM | POA: Insufficient documentation

## 2021-03-30 DIAGNOSIS — R42 Dizziness and giddiness: Secondary | ICD-10-CM

## 2021-03-30 DIAGNOSIS — I6782 Cerebral ischemia: Secondary | ICD-10-CM | POA: Diagnosis not present

## 2021-03-30 DIAGNOSIS — E039 Hypothyroidism, unspecified: Secondary | ICD-10-CM | POA: Diagnosis not present

## 2021-03-30 DIAGNOSIS — E785 Hyperlipidemia, unspecified: Secondary | ICD-10-CM | POA: Diagnosis not present

## 2021-03-30 DIAGNOSIS — E1169 Type 2 diabetes mellitus with other specified complication: Secondary | ICD-10-CM | POA: Diagnosis not present

## 2021-03-30 DIAGNOSIS — I6502 Occlusion and stenosis of left vertebral artery: Secondary | ICD-10-CM | POA: Diagnosis not present

## 2021-03-30 DIAGNOSIS — Z79899 Other long term (current) drug therapy: Secondary | ICD-10-CM | POA: Insufficient documentation

## 2021-03-30 DIAGNOSIS — I6522 Occlusion and stenosis of left carotid artery: Secondary | ICD-10-CM | POA: Diagnosis not present

## 2021-03-30 DIAGNOSIS — E1122 Type 2 diabetes mellitus with diabetic chronic kidney disease: Secondary | ICD-10-CM | POA: Diagnosis not present

## 2021-03-30 LAB — URINALYSIS, ROUTINE W REFLEX MICROSCOPIC
Bilirubin Urine: NEGATIVE
Glucose, UA: NEGATIVE mg/dL
Hgb urine dipstick: NEGATIVE
Ketones, ur: NEGATIVE mg/dL
Leukocytes,Ua: NEGATIVE
Nitrite: NEGATIVE
Protein, ur: 30 mg/dL — AB
Specific Gravity, Urine: <1.005 — ABNORMAL LOW (ref 1.005–1.030)
pH: 6.5 (ref 5.0–8.0)

## 2021-03-30 LAB — BASIC METABOLIC PANEL
Anion gap: 7 (ref 5–15)
BUN: 42 mg/dL — ABNORMAL HIGH (ref 8–23)
CO2: 24 mmol/L (ref 22–32)
Calcium: 9 mg/dL (ref 8.9–10.3)
Chloride: 109 mmol/L (ref 98–111)
Creatinine, Ser: 1.98 mg/dL — ABNORMAL HIGH (ref 0.61–1.24)
GFR, Estimated: 31 mL/min — ABNORMAL LOW (ref >60)
Glucose, Bld: 140 mg/dL — ABNORMAL HIGH (ref 70–99)
Potassium: 4.7 mmol/L (ref 3.5–5.1)
Sodium: 140 mmol/L (ref 135–145)

## 2021-03-30 LAB — CBC WITH DIFFERENTIAL/PLATELET
Abs Immature Granulocytes: 0.04 10*3/uL (ref 0.00–0.07)
Basophils Absolute: 0 10*3/uL (ref 0.0–0.1)
Basophils Relative: 0 %
Eosinophils Absolute: 0 10*3/uL (ref 0.0–0.5)
Eosinophils Relative: 0 %
HCT: 39.2 % (ref 39.0–52.0)
Hemoglobin: 12.7 g/dL — ABNORMAL LOW (ref 13.0–17.0)
Immature Granulocytes: 0 %
Lymphocytes Relative: 37 %
Lymphs Abs: 4.2 10*3/uL — ABNORMAL HIGH (ref 0.7–4.0)
MCH: 31.4 pg (ref 26.0–34.0)
MCHC: 32.4 g/dL (ref 30.0–36.0)
MCV: 97 fL (ref 80.0–100.0)
Monocytes Absolute: 1.2 10*3/uL — ABNORMAL HIGH (ref 0.1–1.0)
Monocytes Relative: 11 %
Neutro Abs: 5.9 10*3/uL (ref 1.7–7.7)
Neutrophils Relative %: 52 %
Platelets: 173 10*3/uL (ref 150–400)
RBC: 4.04 MIL/uL — ABNORMAL LOW (ref 4.22–5.81)
RDW: 14 % (ref 11.5–15.5)
WBC: 11.4 10*3/uL — ABNORMAL HIGH (ref 4.0–10.5)
nRBC: 0 % (ref 0.0–0.2)

## 2021-03-30 LAB — RESP PANEL BY RT-PCR (FLU A&B, COVID) ARPGX2
Influenza A by PCR: NEGATIVE
Influenza B by PCR: NEGATIVE
SARS Coronavirus 2 by RT PCR: NEGATIVE

## 2021-03-30 MED ORDER — SODIUM CHLORIDE 0.9 % IV BOLUS
500.0000 mL | Freq: Once | INTRAVENOUS | Status: AC
  Administered 2021-03-30: 500 mL via INTRAVENOUS

## 2021-03-30 MED ORDER — GADOBUTROL 1 MMOL/ML IV SOLN
8.5000 mL | Freq: Once | INTRAVENOUS | Status: AC | PRN
  Administered 2021-03-30: 8.5 mL via INTRAVENOUS

## 2021-03-30 MED ORDER — SODIUM CHLORIDE 0.9 % IV BOLUS
1000.0000 mL | Freq: Once | INTRAVENOUS | Status: AC
  Administered 2021-03-30: 1000 mL via INTRAVENOUS

## 2021-03-30 NOTE — ED Triage Notes (Signed)
Pt arrives to ED with c/o of dizziness. Over the past week pt has been feeling off balance each morning after he wakes up. Pt reports that this takes a couple minutes and it resolves after he sits on the side of the bed. Today however, pt states he became exteremly dizziness and almost fell. Pt reports that he is not dizzy or off balance at this moment.

## 2021-03-30 NOTE — ED Notes (Signed)
Report given to Lenna Sciara, RN charge at [facility]. Pt going POV for MRI.

## 2021-03-30 NOTE — ED Provider Notes (Signed)
[facility] EMERGENCY DEPARTMENT Provider Note   CSN: [id number] Arrival date & time: 03/30/21  1031     History Chief Complaint  Patient presents with   Dizziness    [name] is a 87 y.o. male presenting as a transfer from [place] for MRI scan of the brain for dizziness/TIA/stroke evaluation.  Patient reports he been feeling off balance for the past week, more so in the morning when he wakes up.  Usually resolves after he sits on the edge of the bed for a few minutes.  However his symptoms were worsening today prompting a visit to the freestanding ED. He reports that he felt a little shaky and weak in the legs today, although he was able to ambulate throughout his facility.  At [facility] to CT scan of the head which is unremarkable, he had basic blood work which is unremarkable.  He was transferred over to emergency department for MRI imaging to evaluate for posterior circulation stroke or injury.  Patient has minimal symptoms on my evaluation.  HPI     Past Medical History:  Diagnosis Date   Anxiety    BRADYCARDIA 11/13/2008   Cataract    Cholelithiasis    COLONIC POLYPS, HX OF 02/04/2007   DEPRESSION 02/04/2007   DIABETES MELLITUS, TYPE II 02/04/2007   DIVERTICULOSIS, COLON 02/04/2007   FOREIGN BODY, ASPIRATION 12/04/2007   GERD (gastroesophageal reflux disease)    GLAUCOMA 03/08/2009   Hepatitis A 1963   HYPERLIPIDEMIA 12/04/2007   HYPERTENSION 02/04/2007   Memory loss    OBSTRUCTIVE SLEEP APNEA 11/05/2009   -PSG 01/05/10 RDI 11, PLMI 56   OSTEOARTHRITIS 02/04/2007   Peptic stricture of esophagus    PROSTATE CANCER 1997   RENAL INSUFFICIENCY 11/13/2008   SYNCOPE 12/31/2009   THROMBOCYTOPENIA 04/17/2008   THYROID NODULE, RIGHT 11/13/2008   UNSPECIFIED ANEMIA 11/07/2007    Patient Active Problem List   Diagnosis Date Noted   Abnormal colonoscopy 12/23/2019   Chronic kidney disease (CKD), stage III (moderate) (HCC) 07/24/2019   Relationship dysfunction  07/08/2018   MCI (mild cognitive impairment) with memory loss 07/08/2018   Driving safety issue 05/24/2018   Change in bowel habits 04/02/2018   Fecal urgency 04/02/2018   Flatus 04/02/2018   Mild cognitive impairment 03/07/2018   Controlled type 2 diabetes mellitus with both eyes affected by mild nonproliferative retinopathy without macular edema, with long-term current use of insulin ([place]) 01/24/2018   Depression, major, single episode, mild ([place]) 01/24/2018   Memory loss 12/21/2017   Constipation 09/09/2017   UTI (urinary tract infection) 02/25/2017   Hypothyroidism 12/10/2015   Cervical neck pain with evidence of disc disease 11/12/2015   Tendinopathy of left rotator cuff 04/16/2015   Hamstring muscle strain 04/16/2015   Abdominal pain, unspecified site 03/12/2014   Diabetes mellitus with renal manifestation ([place]) 11/17/2012   Bicipital tendinitis of right shoulder 11/08/2012   Paresthesia 04/04/2012   Heel pain 11/30/2011   Insomnia 01/20/2011   Routine general medical examination at a health care facility 11/07/2010   Encounter for long-term (current) use of other medications 10/15/2010   EDEMA 04/08/2010   OSTEOARTHRITIS, HIP 03/05/2010   HIP PAIN, RIGHT 03/05/2010   UNEQUAL LEG LENGTH 03/05/2010   LEG CRAMPS 02/17/2010   SYNCOPE 12/31/2009   CAROTID BRUIT, LEFT 12/31/2009   OBSTRUCTIVE SLEEP APNEA 11/05/2009   GLAUCOMA 03/08/2009   THYROID NODULE, RIGHT 11/13/2008   Bradycardia 11/13/2008   Disorder resulting from impaired renal function 11/13/2008   Thrombocytopenia (  [place]) 04/17/2008   Dyslipidemia 12/04/2007   FOREIGN BODY, ASPIRATION 12/04/2007   DEPRESSION 02/04/2007   HTN (hypertension) 02/04/2007   DIVERTICULOSIS, COLON 02/04/2007   OSTEOARTHRITIS 02/04/2007   COLONIC POLYPS, HX OF 02/04/2007    Past Surgical History:  Procedure Laterality Date   Heart Cartherization  2000   PROSTATECTOMY  1997   TONSILLECTOMY  27's       Family History  Problem  Relation Age of Onset   Kidney disease Father        Bright's Disease - died at age 83   Other Mother        no signifincant health problems - died at age 48   Heart disease Neg Hx        No FH of Coronary Artery Disease   Cancer Neg Hx    Colon cancer Neg Hx    Esophageal cancer Neg Hx    Inflammatory bowel disease Neg Hx    Liver disease Neg Hx    Pancreatic cancer Neg Hx    Rectal cancer Neg Hx    Stomach cancer Neg Hx     Social History   Tobacco Use   Smoking status: Former    Types: Cigarettes, Pipe    Quit date: 07/08/1993    Years since quitting: 27.7   Smokeless tobacco: Never  Vaping Use   Vaping Use: Never used  Substance Use Topics   Alcohol use: Yes    Comment: minimal   Drug use: No    Home Medications Prior to Admission medications   Medication Sig Start Date End Date Taking? Authorizing Provider  Cholecalciferol (VITAMIN D) 2000 UNITS CAPS Take 2 capsules by mouth daily.    [provider]  ferrous sulfate 325 (65 FE) MG EC tablet Take 1 tablet (325 mg total) by mouth every other day. 11/14/19   Reed, Tiffany L, DO  levothyroxine (SYNTHROID) 25 MCG tablet Take one tablet by mouth once daily 30 minutes before breakfast on empty stomach. 02/16/21   Ngetich, Dinah C, NP  lisinopril (ZESTRIL) 5 MG tablet Take one tablet by mouth once daily. 11/17/20   Virgie Dad, MD  polycarbophil (FIBERCON) 625 MG tablet Take 625 mg by mouth daily.    [provider]  polyethylene glycol (MIRALAX / GLYCOLAX) 17 g packet Take 17 g by mouth daily. Take 1 to 2 capfuls daily at noon    [provider]  rosuvastatin (CRESTOR) 40 MG tablet TAKE ONE TABLET BY MOUTH EVERY DAY 03/20/21   Virgie Dad, MD  traZODone (DESYREL) 100 MG tablet TAKE ONE TABLET AT BEDTIME. 05/16/20   Reed, Tiffany L, DO    Allergies    Lactose intolerance (gi), Sulfa antibiotics, and Sulfonamide derivatives  Review of Systems   Review of Systems  Constitutional:  Negative  for chills and fever.  Respiratory:  Negative for cough and shortness of breath.   Cardiovascular:  Negative for chest pain and palpitations.  Gastrointestinal:  Negative for abdominal pain and vomiting.  Genitourinary:  Negative for dysuria and hematuria.  Musculoskeletal:  Negative for arthralgias and back pain.  Skin:  Negative for color change and rash.  Neurological:  Positive for dizziness and light-headedness. Negative for syncope.  All other systems reviewed and are negative.  Physical Exam Updated Vital Signs BP (!) 190/80 (BP Location: Right Arm)   Pulse (!) 51   Temp 98.1 F (36.7 C) (Oral)   Resp 16   Ht 5\' 11"  (1.803 m)  Wt 86.2 kg   SpO2 100%   BMI 26.50 kg/m   Physical Exam Constitutional:      General: He is not in acute distress. HENT:     Head: Normocephalic and atraumatic.  Eyes:     Conjunctiva/sclera: Conjunctivae normal.     Pupils: Pupils are equal, round, and reactive to light.  Cardiovascular:     Rate and Rhythm: Normal rate and regular rhythm.  Pulmonary:     Effort: Pulmonary effort is normal. No respiratory distress.  Abdominal:     General: There is no distension.     Tenderness: There is no abdominal tenderness.  Skin:    General: Skin is warm and dry.  Neurological:     General: No focal deficit present.     Mental Status: He is alert and oriented to person, place, and time. Mental status is at baseline.  Psychiatric:        Mood and Affect: Mood normal.        Behavior: Behavior normal.    ED Results / Procedures / Treatments   Labs (all labs ordered are listed, but only abnormal results are displayed) Labs Reviewed  CBC WITH DIFFERENTIAL/PLATELET - Abnormal; Notable for the following components:      Result Value   WBC 11.4 (*)    RBC 4.04 (*)    Hemoglobin 12.7 (*)    Lymphs Abs 4.2 (*)    Monocytes Absolute 1.2 (*)    All other components within normal limits  BASIC METABOLIC PANEL - Abnormal; Notable for the following  components:   Glucose, Bld 140 (*)    BUN 42 (*)    Creatinine, Ser 1.98 (*)    GFR, Estimated 31 (*)    All other components within normal limits  URINALYSIS, ROUTINE W REFLEX MICROSCOPIC - Abnormal; Notable for the following components:   Color, Urine COLORLESS (*)    Specific Gravity, Urine <1.005 (*)    Protein, ur 30 (*)    All other components within normal limits  RESP PANEL BY RT-PCR (FLU A&B, COVID) [id number]    EKG EKG Interpretation  Date/Time:  Monday March 30 2021 10:42:45 EDT Ventricular Rate:  56 PR Interval:  225 QRS Duration: 113 QT Interval:  462 QTC Calculation: 446 R Axis:   -22 Text Interpretation: Sinus rhythm Prolonged PR interval Borderline intraventricular conduction delay No acute changes No significant change since last tracing Confirmed by Varney Biles ([id number]) on 03/30/2021 12:28:25 PM  Radiology CT Head Wo Contrast  Result Date: 03/30/2021 CLINICAL DATA:  Neurological deficit. Acute, stroke suspected. Dizziness. EXAM: CT HEAD WITHOUT CONTRAST TECHNIQUE: Contiguous axial images were obtained from the base of the skull through the vertex without intravenous contrast. COMPARISON:  MRI 04/17/2018 FINDINGS: Brain: Mild age related volume loss. No focal abnormality seen affecting the brainstem or cerebellum. Cerebral hemispheres show minimal small vessel change of the white matter. No cortical or large vessel territory infarction. No mass lesion, hemorrhage, hydrocephalus or extra-axial collection. Vascular: No abnormal vascular finding. Skull: Normal Sinuses/Orbits: Clear/normal Other: None IMPRESSION: No acute finding by CT. Mild age related atrophy. Minimal small vessel change of the white matter. Electronically Signed   By: Nelson Chimes M.D.   On: 03/30/2021 13:51   MR ANGIO HEAD WO CONTRAST  Result Date: 03/30/2021 CLINICAL DATA:  Initial evaluation for neuro deficit, stroke suspected. EXAM: MRI HEAD WITHOUT CONTRAST MRA HEAD WITHOUT CONTRAST MRA NECK  WITHOUT AND WITH CONTRAST TECHNIQUE: Multiplanar, multi-echo pulse sequences of the  brain and surrounding structures were acquired without intravenous contrast. Angiographic images of the Circle of Willis were acquired using MRA technique without intravenous contrast. Angiographic images of the neck were acquired using MRA technique without and with intravenous contrast. Carotid stenosis measurements (when applicable) are obtained utilizing NASCET criteria, using the distal internal carotid diameter as the denominator. CONTRAST:  8.80mL GADAVIST GADOBUTROL 1 MMOL/ML IV SOLN COMPARISON:  CT from earlier the same day as well as previous brain MRI from 04/17/2018. FINDINGS: MRI HEAD FINDINGS Brain: Cerebral volume within normal limits for age. Patchy T2/FLAIR hyperintensity involving the periventricular deep white matter both cerebral hemispheres, most consistent with chronic microvascular ischemic disease, mild for age. Minimal patchy involvement of the pons noted. No abnormal foci of restricted diffusion to suggest acute or subacute ischemia. Gray-white matter differentiation maintained. No encephalomalacia to suggest chronic cortical infarction. No evidence for acute or chronic intracranial hemorrhage. No mass lesion, midline shift or mass effect. No hydrocephalus or extra-axial fluid collection. Pituitary gland suprasellar region normal. Midline structures intact and normal. Vascular: Major intracranial vascular flow voids are maintained. Skull and upper cervical spine: Degenerative osteoarthritic changes present about the C1-2 articulation, with associated thickening of the tectorial membrane. No high-grade stenosis at the craniocervical junction. Bone marrow signal intensity within normal limits. No scalp soft tissue abnormality. Sinuses/Orbits: Patient status post bilateral ocular lens replacement. Globes and orbital soft tissues demonstrate no acute finding. Paranasal sinuses and mastoid air cells are clear.  Inner ear structures grossly normal. Other: None. MRA HEAD FINDINGS Anterior circulation: Visualized distal cervical segments of the internal carotid arteries are patent with antegrade flow. Distal cervical left ICA tortuous. Petrous, cavernous, and supraclinoid segments widely patent without stenosis. Origins of the ophthalmic arteries patent and normal. A1 segments patent bilaterally. Right A1 hypoplastic. Normal anterior communicating artery complex. Anterior cerebral arteries patent to their distal aspects without stenosis. No M1 stenosis or occlusion. Normal MCA bifurcations. Distal MCA branches well perfused and symmetric. Mild distal small vessel atheromatous irregularity. Posterior circulation: Vertebral arteries patent to the vertebrobasilar junction without stenosis. Right vertebral artery slightly dominant. Both PICA origins patent and normal. Basilar patent to its distal aspect without stenosis. Superior cerebellar arteries are patent bilaterally. Both PCAs primarily supplied via the basilar well perfused to their distal aspects. Anatomic variants: Hypoplastic right A1 segment. No intracranial aneurysm or other vascular abnormality. MRA NECK FINDINGS Aortic arch: Visualized aortic arch normal in caliber with normal branch pattern. No hemodynamically significant stenosis seen about the origin of the great vessels. Right carotid system: Right common and internal carotid arteries patent without stenosis, evidence for dissection or occlusion. Tortuosity about the mid cervical right ICA noted. Left carotid system: Left CCA patent from its origin to the bifurcation without stenosis. Atheromatous change at the origin of the cervical left ICA with associated stenosis of up to 40-50% by NASCET criteria (series 1104, image 7). Cervical left ICA mildly tortuous but otherwise patent to the skull base without stenosis, evidence for dissection or occlusion. Vertebral arteries: Both vertebral arteries supplied via the  subclavian arteries. No significant proximal subclavian artery stenosis. Right vertebral artery slightly dominant. Note made of a short-segment mild stenosis involving the proximal-mid left V2 segment (series 1122, image 11). Otherwise, vertebral arteries patent without stenosis, evidence for dissection or occlusion. Other: None IMPRESSION: MRI HEAD IMPRESSION: 1. No acute intracranial abnormality. 2. Mild chronic microvascular ischemic disease for age. MRA HEAD IMPRESSION: 1. Negative intracranial MRA for large vessel occlusion. No hemodynamically significant or correctable stenosis.  2. Mild distal small vessel atheromatous irregularity. MRA NECK IMPRESSION: 1. Atherosclerotic change at the origin of the cervical left ICA with associated stenosis of up to 40-50% by NASCET criteria. 2. No significant stenosis within the right carotid artery system. 3. Short-segment mild stenosis involving the proximal-mid left V2 segment. Otherwise wide patency of the vertebral arteries within the neck. Right vertebral artery slightly dominant. Electronically Signed   By: Jeannine Boga M.D.   On: 03/30/2021 21:03   MR Angiogram Neck W or Wo Contrast  Result Date: 03/30/2021 CLINICAL DATA:  Initial evaluation for neuro deficit, stroke suspected. EXAM: MRI HEAD WITHOUT CONTRAST MRA HEAD WITHOUT CONTRAST MRA NECK WITHOUT AND WITH CONTRAST TECHNIQUE: Multiplanar, multi-echo pulse sequences of the brain and surrounding structures were acquired without intravenous contrast. Angiographic images of the Circle of Willis were acquired using MRA technique without intravenous contrast. Angiographic images of the neck were acquired using MRA technique without and with intravenous contrast. Carotid stenosis measurements (when applicable) are obtained utilizing NASCET criteria, using the distal internal carotid diameter as the denominator. CONTRAST:  8.43mL GADAVIST GADOBUTROL 1 MMOL/ML IV SOLN COMPARISON:  CT from earlier the same day  as well as previous brain MRI from 04/17/2018. FINDINGS: MRI HEAD FINDINGS Brain: Cerebral volume within normal limits for age. Patchy T2/FLAIR hyperintensity involving the periventricular deep white matter both cerebral hemispheres, most consistent with chronic microvascular ischemic disease, mild for age. Minimal patchy involvement of the pons noted. No abnormal foci of restricted diffusion to suggest acute or subacute ischemia. Gray-white matter differentiation maintained. No encephalomalacia to suggest chronic cortical infarction. No evidence for acute or chronic intracranial hemorrhage. No mass lesion, midline shift or mass effect. No hydrocephalus or extra-axial fluid collection. Pituitary gland suprasellar region normal. Midline structures intact and normal. Vascular: Major intracranial vascular flow voids are maintained. Skull and upper cervical spine: Degenerative osteoarthritic changes present about the C1-2 articulation, with associated thickening of the tectorial membrane. No high-grade stenosis at the craniocervical junction. Bone marrow signal intensity within normal limits. No scalp soft tissue abnormality. Sinuses/Orbits: Patient status post bilateral ocular lens replacement. Globes and orbital soft tissues demonstrate no acute finding. Paranasal sinuses and mastoid air cells are clear. Inner ear structures grossly normal. Other: None. MRA HEAD FINDINGS Anterior circulation: Visualized distal cervical segments of the internal carotid arteries are patent with antegrade flow. Distal cervical left ICA tortuous. Petrous, cavernous, and supraclinoid segments widely patent without stenosis. Origins of the ophthalmic arteries patent and normal. A1 segments patent bilaterally. Right A1 hypoplastic. Normal anterior communicating artery complex. Anterior cerebral arteries patent to their distal aspects without stenosis. No M1 stenosis or occlusion. Normal MCA bifurcations. Distal MCA branches well perfused and  symmetric. Mild distal small vessel atheromatous irregularity. Posterior circulation: Vertebral arteries patent to the vertebrobasilar junction without stenosis. Right vertebral artery slightly dominant. Both PICA origins patent and normal. Basilar patent to its distal aspect without stenosis. Superior cerebellar arteries are patent bilaterally. Both PCAs primarily supplied via the basilar well perfused to their distal aspects. Anatomic variants: Hypoplastic right A1 segment. No intracranial aneurysm or other vascular abnormality. MRA NECK FINDINGS Aortic arch: Visualized aortic arch normal in caliber with normal branch pattern. No hemodynamically significant stenosis seen about the origin of the great vessels. Right carotid system: Right common and internal carotid arteries patent without stenosis, evidence for dissection or occlusion. Tortuosity about the mid cervical right ICA noted. Left carotid system: Left CCA patent from its origin to the bifurcation without stenosis. Atheromatous change at  the origin of the cervical left ICA with associated stenosis of up to 40-50% by NASCET criteria (series 1104, image 7). Cervical left ICA mildly tortuous but otherwise patent to the skull base without stenosis, evidence for dissection or occlusion. Vertebral arteries: Both vertebral arteries supplied via the subclavian arteries. No significant proximal subclavian artery stenosis. Right vertebral artery slightly dominant. Note made of a short-segment mild stenosis involving the proximal-mid left V2 segment (series 1122, image 11). Otherwise, vertebral arteries patent without stenosis, evidence for dissection or occlusion. Other: None IMPRESSION: MRI HEAD IMPRESSION: 1. No acute intracranial abnormality. 2. Mild chronic microvascular ischemic disease for age. MRA HEAD IMPRESSION: 1. Negative intracranial MRA for large vessel occlusion. No hemodynamically significant or correctable stenosis. 2. Mild distal small vessel  atheromatous irregularity. MRA NECK IMPRESSION: 1. Atherosclerotic change at the origin of the cervical left ICA with associated stenosis of up to 40-50% by NASCET criteria. 2. No significant stenosis within the right carotid artery system. 3. Short-segment mild stenosis involving the proximal-mid left V2 segment. Otherwise wide patency of the vertebral arteries within the neck. Right vertebral artery slightly dominant. Electronically Signed   By: Jeannine Boga M.D.   On: 03/30/2021 21:03   MR BRAIN WO CONTRAST  Result Date: 03/30/2021 CLINICAL DATA:  Initial evaluation for neuro deficit, stroke suspected. EXAM: MRI HEAD WITHOUT CONTRAST MRA HEAD WITHOUT CONTRAST MRA NECK WITHOUT AND WITH CONTRAST TECHNIQUE: Multiplanar, multi-echo pulse sequences of the brain and surrounding structures were acquired without intravenous contrast. Angiographic images of the Circle of Willis were acquired using MRA technique without intravenous contrast. Angiographic images of the neck were acquired using MRA technique without and with intravenous contrast. Carotid stenosis measurements (when applicable) are obtained utilizing NASCET criteria, using the distal internal carotid diameter as the denominator. CONTRAST:  8.20mL GADAVIST GADOBUTROL 1 MMOL/ML IV SOLN COMPARISON:  CT from earlier the same day as well as previous brain MRI from 04/17/2018. FINDINGS: MRI HEAD FINDINGS Brain: Cerebral volume within normal limits for age. Patchy T2/FLAIR hyperintensity involving the periventricular deep white matter both cerebral hemispheres, most consistent with chronic microvascular ischemic disease, mild for age. Minimal patchy involvement of the pons noted. No abnormal foci of restricted diffusion to suggest acute or subacute ischemia. Gray-white matter differentiation maintained. No encephalomalacia to suggest chronic cortical infarction. No evidence for acute or chronic intracranial hemorrhage. No mass lesion, midline shift or  mass effect. No hydrocephalus or extra-axial fluid collection. Pituitary gland suprasellar region normal. Midline structures intact and normal. Vascular: Major intracranial vascular flow voids are maintained. Skull and upper cervical spine: Degenerative osteoarthritic changes present about the C1-2 articulation, with associated thickening of the tectorial membrane. No high-grade stenosis at the craniocervical junction. Bone marrow signal intensity within normal limits. No scalp soft tissue abnormality. Sinuses/Orbits: Patient status post bilateral ocular lens replacement. Globes and orbital soft tissues demonstrate no acute finding. Paranasal sinuses and mastoid air cells are clear. Inner ear structures grossly normal. Other: None. MRA HEAD FINDINGS Anterior circulation: Visualized distal cervical segments of the internal carotid arteries are patent with antegrade flow. Distal cervical left ICA tortuous. Petrous, cavernous, and supraclinoid segments widely patent without stenosis. Origins of the ophthalmic arteries patent and normal. A1 segments patent bilaterally. Right A1 hypoplastic. Normal anterior communicating artery complex. Anterior cerebral arteries patent to their distal aspects without stenosis. No M1 stenosis or occlusion. Normal MCA bifurcations. Distal MCA branches well perfused and symmetric. Mild distal small vessel atheromatous irregularity. Posterior circulation: Vertebral arteries patent to the vertebrobasilar junction  without stenosis. Right vertebral artery slightly dominant. Both PICA origins patent and normal. Basilar patent to its distal aspect without stenosis. Superior cerebellar arteries are patent bilaterally. Both PCAs primarily supplied via the basilar well perfused to their distal aspects. Anatomic variants: Hypoplastic right A1 segment. No intracranial aneurysm or other vascular abnormality. MRA NECK FINDINGS Aortic arch: Visualized aortic arch normal in caliber with normal branch  pattern. No hemodynamically significant stenosis seen about the origin of the great vessels. Right carotid system: Right common and internal carotid arteries patent without stenosis, evidence for dissection or occlusion. Tortuosity about the mid cervical right ICA noted. Left carotid system: Left CCA patent from its origin to the bifurcation without stenosis. Atheromatous change at the origin of the cervical left ICA with associated stenosis of up to 40-50% by NASCET criteria (series 1104, image 7). Cervical left ICA mildly tortuous but otherwise patent to the skull base without stenosis, evidence for dissection or occlusion. Vertebral arteries: Both vertebral arteries supplied via the subclavian arteries. No significant proximal subclavian artery stenosis. Right vertebral artery slightly dominant. Note made of a short-segment mild stenosis involving the proximal-mid left V2 segment (series 1122, image 11). Otherwise, vertebral arteries patent without stenosis, evidence for dissection or occlusion. Other: None IMPRESSION: MRI HEAD IMPRESSION: 1. No acute intracranial abnormality. 2. Mild chronic microvascular ischemic disease for age. MRA HEAD IMPRESSION: 1. Negative intracranial MRA for large vessel occlusion. No hemodynamically significant or correctable stenosis. 2. Mild distal small vessel atheromatous irregularity. MRA NECK IMPRESSION: 1. Atherosclerotic change at the origin of the cervical left ICA with associated stenosis of up to 40-50% by NASCET criteria. 2. No significant stenosis within the right carotid artery system. 3. Short-segment mild stenosis involving the proximal-mid left V2 segment. Otherwise wide patency of the vertebral arteries within the neck. Right vertebral artery slightly dominant. Electronically Signed   By: Jeannine Boga M.D.   On: 03/30/2021 21:03    Procedures Procedures   Medications Ordered in ED Medications  sodium chloride 0.9 % bolus 1,000 mL (0 mLs Intravenous  Stopped 03/30/21 1259)  gadobutrol (GADAVIST) 1 MMOL/ML injection 8.5 mL (8.5 mLs Intravenous Contrast Given 03/30/21 1931)  sodium chloride 0.9 % bolus 500 mL (500 mLs Intravenous New Bag/Given 03/30/21 2038)    ED Course  I have reviewed the triage vital signs and the nursing notes.  Pertinent labs & imaging results that were available during my care of the patient were reviewed by me and considered in my medical decision making (see chart for details).  Very pleasant gentleman here as a transfer for MRI imaging of the brain for dizziness x 1 week with ambulation.  Per his symptoms this does appear more consistent with orthostasis (worse in the morning with getting out of bed).  He had negative orthostatic vital signs at the earlier ED, but I think a small fluid bolus may benefit him.  I personally viewed the patient's labs, urinalysis, and neuroimaging.  These are unremarkable for stroke.  Reassessed the patient is able to ambulate steadily.  I do think he is stable for discharge.  He prefers to go home at this time.  He has no chest pain or EKG findings per my interpretation to suggest acute coronary syndrome.  We did discuss covid swabbing (he had covid 2 years ago and has had the vaccine x 2 with 2 boosters).  MRI imaging obtained and reviewed - showing no evidence of supravascular injury or critical arterial stenosis.  Patient wanting to go home -  wife here with him.  I think he's reasonable safe for discharge at this time, steady on his feet with his walker at bedside. Okay for discharge.  Prior medical records reviewed - last echo 05/01/2018 with normal LV function, EF 60%, mild grade 1 diastolic dysfunction, mild aortic regurg, largely unremarkable.    Clinical Course as of 03/30/21 2129  Mon Mar 30, 2021  1911 Pt at MRI [MT]  2129 Patient is wife updated regarding the results.  They are still wanting to go home.  Okay for discharge [MT]    Clinical Course User Index [MT]  Jazmyn Offner, Carola Rhine, MD   Final Clinical Impression(s) / ED Diagnoses Final diagnoses:  Dizziness    Rx / DC Orders ED Discharge Orders     None        Wyvonnia Dusky, MD 03/30/21 2129

## 2021-03-30 NOTE — Discharge Instructions (Addendum)
Your MRI scans did not show signs of a stroke in the back of the brain or the balance center.  Recommended kidney drinking plenty of water and that you follow-up with your primary care doctor for this issue of dizziness and weakness.  If you begin to have fevers, coughing, chest pain, or loss of consciousness, please return to the ER

## 2021-03-30 NOTE — Telephone Encounter (Signed)
Patient's wife [name]) called to schedule appointment with Dr. Lyndel Safe due to a possible stroke. Patient is at the [place] emergency dept. Having a CT of the head done and will be going to [facility] to have a MRI done per wife. She reports that the doctor at [facility] advised her to schedule a follow-up appointment with Dr. Lyndel Safe  this week and I advised wife that she doesn't have open appointments this week or next week. Wife doesn't want patient to see another provider. She wants to know if Dr. Lyndel Safe would squeeze patient somewhere on the schedule. Please advise.

## 2021-04-01 ENCOUNTER — Telehealth: Payer: Self-pay | Admitting: Internal Medicine

## 2021-04-01 NOTE — Telephone Encounter (Signed)
Yes, I can accept him.

## 2021-04-01 NOTE — Telephone Encounter (Signed)
   Have you agreed to see [name] as a new pateint, spouse [name]?

## 2021-04-06 ENCOUNTER — Encounter: Payer: Self-pay | Admitting: Family

## 2021-04-06 ENCOUNTER — Ambulatory Visit (INDEPENDENT_AMBULATORY_CARE_PROVIDER_SITE_OTHER): Payer: Medicare Other | Admitting: Family

## 2021-04-06 ENCOUNTER — Other Ambulatory Visit: Payer: Self-pay

## 2021-04-06 VITALS — BP 150/80 | HR 61 | Temp 98.4°F | Resp 16 | Ht 71.0 in | Wt 194.6 lb

## 2021-04-06 DIAGNOSIS — R2681 Unsteadiness on feet: Secondary | ICD-10-CM

## 2021-04-06 DIAGNOSIS — S51011A Laceration without foreign body of right elbow, initial encounter: Secondary | ICD-10-CM

## 2021-04-06 DIAGNOSIS — Y92009 Unspecified place in unspecified non-institutional (private) residence as the place of occurrence of the external cause: Secondary | ICD-10-CM

## 2021-04-06 DIAGNOSIS — W19XXXA Unspecified fall, initial encounter: Secondary | ICD-10-CM

## 2021-04-06 LAB — CBC WITH DIFFERENTIAL/PLATELET
Absolute Monocytes: 1410 cells/uL — ABNORMAL HIGH (ref 200–950)
Basophils Absolute: 40 cells/uL (ref 0–200)
Basophils Relative: 0.3 %
Eosinophils Absolute: 0 cells/uL — ABNORMAL LOW (ref 15–500)
Eosinophils Relative: 0 %
HCT: 39 % (ref 38.5–50.0)
Hemoglobin: 13.2 g/dL (ref 13.2–17.1)
Lymphs Abs: 5094 cells/uL — ABNORMAL HIGH (ref 850–3900)
MCH: 32 pg (ref 27.0–33.0)
MCHC: 33.8 g/dL (ref 32.0–36.0)
MCV: 94.4 fL (ref 80.0–100.0)
MPV: 12.5 fL (ref 7.5–12.5)
Monocytes Relative: 10.6 %
Neutro Abs: 6756 cells/uL (ref 1500–7800)
Neutrophils Relative %: 50.8 %
Platelets: 199 10*3/uL (ref 140–400)
RBC: 4.13 10*6/uL — ABNORMAL LOW (ref 4.20–5.80)
RDW: 13.1 % (ref 11.0–15.0)
Total Lymphocyte: 38.3 %
WBC: 13.3 10*3/uL — ABNORMAL HIGH (ref 3.8–10.8)

## 2021-04-06 NOTE — Patient Instructions (Addendum)
-  cleanse right elbow skin tear with saline ,pat dry,apply triple antibiotic ointment,cover with foam dressing for absorption and protection.change dressing daily and as needed if soiled.   - Notify provider for any signs of infection ( redness,odor or drainage).

## 2021-04-06 NOTE — Progress Notes (Signed)
Provider: Mardell Suttles FNP-C  Virgie Dad, MD  Patient Care Team: Virgie Dad, MD as PCP - General (Internal Medicine) Sherren Mocha, MD as PCP - Cardiology (Cardiology) Nobie Putnam, MD (Hematology and Oncology) Martinique, Amy, MD as Consulting Physician (Dermatology)  Extended Emergency Contact Information Primary Emergency Contact: Antuna,Simone Address: [address]          [place], [place] [address] Johnnette Litter of [place] Phone: [phone] Mobile Phone: [phone] Relation: Spouse  Code Status: Full Code  Goals of care: Advanced Directive information Advanced Directives 04/06/2021  Does Patient Have a Medical Advance Directive? Yes  Type of Advance Directive Living will  Does patient want to make changes to medical advance directive? No - Patient declined  Copy of [place] in Chart? -  Would patient like information on creating a medical advance directive? -  Pre-existing out of facility DNR order (yellow form or pink MOST form) -     Chief Complaint  Patient presents with   Acute Visit    Patient wants to discuss MRI results.    HPI:  Pt is a 87 y.o. male seen today for an acute visit for evaluation of Fall 04/04/2021.He is here with the wife who provides additional HPI information.Not sure whether he blank out or trip on saturday sustaining a fall.does not recall hitting head or any loss of consciousness.He complains of right elbow skin tear.states no bleeding or drainage from skin tear site.wife applied non-adhesive gauze with tape.would like provider to assess and change dressing.wants to know what to apply on skin tear.  He was seen in [facility] ED 03/30/2021 for dizziness was transferred to [facility] for MRI imaging to evaluate for posterior circulation stroke or injury.Had minimal symptoms on evaluation.CT scan of the head done at [facility] was unremarkable.Also had blood work that was unremarkable. MRI  and lab work results reviewed today with patient and wife.questions answered to the best of my knowledge.Reports no dizziness today.   Past Medical History:  Diagnosis Date   Anxiety    BRADYCARDIA 11/13/2008   Cataract    Cholelithiasis    COLONIC POLYPS, HX OF 02/04/2007   DEPRESSION 02/04/2007   DIABETES MELLITUS, TYPE II 02/04/2007   DIVERTICULOSIS, COLON 02/04/2007   FOREIGN BODY, ASPIRATION 12/04/2007   GERD (gastroesophageal reflux disease)    GLAUCOMA 03/08/2009   Hepatitis A 1963   HYPERLIPIDEMIA 12/04/2007   HYPERTENSION 02/04/2007   Memory loss    OBSTRUCTIVE SLEEP APNEA 11/05/2009   -PSG 01/05/10 RDI 11, PLMI 56   OSTEOARTHRITIS 02/04/2007   Peptic stricture of esophagus    PROSTATE CANCER 1997   RENAL INSUFFICIENCY 11/13/2008   SYNCOPE 12/31/2009   THROMBOCYTOPENIA 04/17/2008   THYROID NODULE, RIGHT 11/13/2008   UNSPECIFIED ANEMIA 11/07/2007   Past Surgical History:  Procedure Laterality Date   Heart Cartherization  2000   PROSTATECTOMY  1997   TONSILLECTOMY  1940's    Allergies  Allergen Reactions   Lactose Intolerance (Gi)    Sulfa Antibiotics     Other reaction(s): RASH,PAPULAR   Sulfonamide Derivatives     REACTION: Itch/Red Spots    Outpatient Encounter Medications as of 04/06/2021  Medication Sig   Cholecalciferol (VITAMIN D) 2000 UNITS CAPS Take 2 capsules by mouth daily.   ferrous sulfate 325 (65 FE) MG EC tablet Take 1 tablet (325 mg total) by mouth every other day.   levothyroxine (SYNTHROID) 25 MCG tablet Take one tablet by  mouth once daily 30 minutes before breakfast on empty stomach.   lisinopril (ZESTRIL) 5 MG tablet Take one tablet by mouth once daily.   polycarbophil (FIBERCON) 625 MG tablet Take 625 mg by mouth daily.   polyethylene glycol (MIRALAX / GLYCOLAX) 17 g packet Take 17 g by mouth daily. Take 1 to 2 capfuls daily at noon   rosuvastatin (CRESTOR) 40 MG tablet TAKE ONE TABLET BY MOUTH EVERY DAY   traZODone (DESYREL) 100 MG tablet TAKE ONE TABLET  AT BEDTIME.   No facility-administered encounter medications on file as of 04/06/2021.    Review of Systems  Constitutional:  Negative for appetite change, chills, fatigue, fever and unexpected weight change.  HENT:  Positive for hearing loss. Negative for congestion, ear discharge, ear pain, facial swelling, nosebleeds, postnasal drip, rhinorrhea, sinus pressure, sinus pain, sneezing, sore throat, tinnitus and trouble swallowing.   Eyes:  Negative for pain, discharge, redness, itching and visual disturbance.  Respiratory:  Negative for cough, chest tightness, shortness of breath and wheezing.   Cardiovascular:  Positive for leg swelling. Negative for chest pain and palpitations.  Gastrointestinal:  Negative for abdominal distention, abdominal pain, blood in stool, constipation, diarrhea, nausea and vomiting.  Musculoskeletal:  Positive for gait problem. Negative for arthralgias, back pain, joint swelling, myalgias, neck pain and neck stiffness.  Skin:  Negative for color change, pallor, rash and wound.  Neurological:  Negative for dizziness, syncope, speech difficulty, weakness, light-headedness, numbness and headaches.  Hematological:  Does not bruise/bleed easily.  Psychiatric/Behavioral:  Negative for agitation, behavioral problems, confusion and hallucinations. The patient is not nervous/anxious.    Immunization History  Administered Date(s) Administered   Fluad Quad(high Dose 65+) 02/27/2019   Influenza Split 04/05/2011, 04/04/2012   Influenza Whole 03/27/2008, 04/15/2009, 03/20/2010   Influenza, High Dose Seasonal PF 03/31/2016, 04/06/2017, 03/29/2018, 04/25/2020   Influenza,inj,Quad PF,6+ Mos 04/03/2013, 03/14/2015   Influenza-Unspecified 06/29/1999, 03/28/2000, 03/28/2001, 03/29/2003, 04/28/2004, 05/28/2004, 04/25/2005, 04/27/2005, 04/11/2006, 03/29/2007, 05/10/2007, 02/27/2008, 04/07/2011, 04/19/2012, 04/03/2013, 03/28/2014, 02/27/2015, 03/26/2016, 03/20/2017   Moderna  Sars-Covid-2 Vaccination 08/10/2019, 09/06/2019, 05/13/2020   Pneumococcal Conjugate-13 05/06/2014, 03/14/2015   Pneumococcal Polysaccharide-23 06/29/1995   Pneumococcal-Unspecified 08/28/2001   Td 11/27/1995, 10/26/2004, 11/13/2004   Tdap 05/06/2014, 12/02/2016   Zoster Recombinat (Shingrix) 12/30/2017, 05/22/2018   Zoster, Live 06/28/2004   Pertinent  Health Maintenance Due  Topic Date Due   INFLUENZA VACCINE  01/26/2021   FOOT EXAM  09/09/2021 (Originally 05/25/2019)   HEMOGLOBIN A1C  05/21/2021   OPHTHALMOLOGY EXAM  06/04/2021   Fall Risk  04/06/2021 11/12/2020 09/09/2020 07/23/2020 06/04/2020  Falls in the past year? 1 0 0 0 0  Number falls in past yr: 0 0 0 0 0  Injury with Fall? 0 - 0 0 0  Risk for fall due to : History of fall(s) - - - -  Follow up Falls evaluation completed - - - -   Functional Status Survey:    Vitals:   04/06/21 1009  BP: (!) 150/80  Pulse: 61  Resp: 16  Temp: 98.4 F (36.9 C)  SpO2: 97%  Weight: 194 lb 9.6 oz (88.3 kg)  Height: 5\' 11"  (1.803 m)   Body mass index is 27.14 kg/m. Physical Exam Vitals reviewed.  Constitutional:      General: He is not in acute distress.    Appearance: Normal appearance. He is normal weight. He is not ill-appearing or diaphoretic.  HENT:     Head: Normocephalic.     Mouth/Throat:     Mouth: Mucous  membranes are moist.     Pharynx: Oropharynx is clear. No oropharyngeal exudate or posterior oropharyngeal erythema.  Eyes:     General: No scleral icterus.       Right eye: No discharge.        Left eye: No discharge.     Conjunctiva/sclera: Conjunctivae normal.     Pupils: Pupils are equal, round, and reactive to light.  Neck:     Vascular: No carotid bruit.  Cardiovascular:     Rate and Rhythm: Normal rate and regular rhythm.     Pulses: Normal pulses.     Heart sounds: Normal heart sounds. No murmur heard.   No friction rub. No gallop.  Pulmonary:     Effort: Pulmonary effort is normal. No respiratory  distress.     Breath sounds: Normal breath sounds. No wheezing, rhonchi or rales.  Chest:     Chest wall: No tenderness.  Abdominal:     General: Bowel sounds are normal. There is no distension.     Palpations: Abdomen is soft. There is no mass.     Tenderness: There is no abdominal tenderness. There is no right CVA tenderness, left CVA tenderness, guarding or rebound.  Musculoskeletal:        General: No swelling or tenderness. Normal range of motion.     Cervical back: Normal range of motion. No rigidity or tenderness.     Comments: Bilateral lower extremities trace edema Unsteady gait   Lymphadenopathy:     Cervical: No cervical adenopathy.  Skin:    General: Skin is warm and dry.     Coloration: Skin is not pale.     Findings: No erythema, lesion or rash.     Comments: Right elbow skin Tear without any skin flap 7 cm X 2 cm wound bed red with small area bleeding.Pressure applied to small bleeding site.wound cleansed with saline wound cleanser,pat dry,Triple antibiotic Ointment applied covered with foam dressing for absorption and extra protection.Tolerated procedure well.   Neurological:     Mental Status: He is alert and oriented to person, place, and time.     Cranial Nerves: No cranial nerve deficit.     Sensory: No sensory deficit.     Motor: No weakness.     Coordination: Coordination normal.     Gait: Gait abnormal.  Psychiatric:        Mood and Affect: Mood normal.        Speech: Speech normal.        Behavior: Behavior normal.        Thought Content: Thought content normal.        Judgment: Judgment normal.    Labs reviewed: Recent Labs    07/17/20 0000 11/18/20 0000 03/30/21 1124  NA 142 142 140  K 4.7 5.0 4.7  CL 107 109* 109  CO2 23* 22 24  GLUCOSE  --   --  140*  BUN 39* 36* 42*  CREATININE 2.0* 2.1* 1.98*  CALCIUM 8.6* 9.0 9.0   Recent Labs    07/17/20 0000 11/18/20 0000  AST 23 25  ALT 13 14  ALKPHOS  --  71  ALBUMIN 4.0 4.1   Recent Labs     07/17/20 0000 11/18/20 0000 03/30/21 1124  WBC 8.8 8.0 11.4*  NEUTROABS  --   --  5.9  HGB 12.8* 12.6* 12.7*  HCT 37* 38* 39.2  MCV  --   --  97.0  PLT 133* 161 173   Lab  Results  Component Value Date   TSH 5.72 11/18/2020   Lab Results  Component Value Date   HGBA1C 6.2 11/18/2020   Lab Results  Component Value Date   CHOL 116 07/17/2020   HDL 34 (A) 07/17/2020   LDLCALC 62 07/17/2020   TRIG 104 07/17/2020   CHOLHDL 4 11/22/2017    Significant Diagnostic Results in last 30 days:  CT Head Wo Contrast  Result Date: 03/30/2021 CLINICAL DATA:  Neurological deficit. Acute, stroke suspected. Dizziness. EXAM: CT HEAD WITHOUT CONTRAST TECHNIQUE: Contiguous axial images were obtained from the base of the skull through the vertex without intravenous contrast. COMPARISON:  MRI 04/17/2018 FINDINGS: Brain: Mild age related volume loss. No focal abnormality seen affecting the brainstem or cerebellum. Cerebral hemispheres show minimal small vessel change of the white matter. No cortical or large vessel territory infarction. No mass lesion, hemorrhage, hydrocephalus or extra-axial collection. Vascular: No abnormal vascular finding. Skull: Normal Sinuses/Orbits: Clear/normal Other: None IMPRESSION: No acute finding by CT. Mild age related atrophy. Minimal small vessel change of the white matter. Electronically Signed   By: Nelson Chimes M.D.   On: 03/30/2021 13:51   MR ANGIO HEAD WO CONTRAST  Result Date: 03/30/2021 CLINICAL DATA:  Initial evaluation for neuro deficit, stroke suspected. EXAM: MRI HEAD WITHOUT CONTRAST MRA HEAD WITHOUT CONTRAST MRA NECK WITHOUT AND WITH CONTRAST TECHNIQUE: Multiplanar, multi-echo pulse sequences of the brain and surrounding structures were acquired without intravenous contrast. Angiographic images of the Circle of Willis were acquired using MRA technique without intravenous contrast. Angiographic images of the neck were acquired using MRA technique without and  with intravenous contrast. Carotid stenosis measurements (when applicable) are obtained utilizing NASCET criteria, using the distal internal carotid diameter as the denominator. CONTRAST:  8.110mL GADAVIST GADOBUTROL 1 MMOL/ML IV SOLN COMPARISON:  CT from earlier the same day as well as previous brain MRI from 04/17/2018. FINDINGS: MRI HEAD FINDINGS Brain: Cerebral volume within normal limits for age. Patchy T2/FLAIR hyperintensity involving the periventricular deep white matter both cerebral hemispheres, most consistent with chronic microvascular ischemic disease, mild for age. Minimal patchy involvement of the pons noted. No abnormal foci of restricted diffusion to suggest acute or subacute ischemia. Gray-white matter differentiation maintained. No encephalomalacia to suggest chronic cortical infarction. No evidence for acute or chronic intracranial hemorrhage. No mass lesion, midline shift or mass effect. No hydrocephalus or extra-axial fluid collection. Pituitary gland suprasellar region normal. Midline structures intact and normal. Vascular: Major intracranial vascular flow voids are maintained. Skull and upper cervical spine: Degenerative osteoarthritic changes present about the C1-2 articulation, with associated thickening of the tectorial membrane. No high-grade stenosis at the craniocervical junction. Bone marrow signal intensity within normal limits. No scalp soft tissue abnormality. Sinuses/Orbits: Patient status post bilateral ocular lens replacement. Globes and orbital soft tissues demonstrate no acute finding. Paranasal sinuses and mastoid air cells are clear. Inner ear structures grossly normal. Other: None. MRA HEAD FINDINGS Anterior circulation: Visualized distal cervical segments of the internal carotid arteries are patent with antegrade flow. Distal cervical left ICA tortuous. Petrous, cavernous, and supraclinoid segments widely patent without stenosis. Origins of the ophthalmic arteries patent and  normal. A1 segments patent bilaterally. Right A1 hypoplastic. Normal anterior communicating artery complex. Anterior cerebral arteries patent to their distal aspects without stenosis. No M1 stenosis or occlusion. Normal MCA bifurcations. Distal MCA branches well perfused and symmetric. Mild distal small vessel atheromatous irregularity. Posterior circulation: Vertebral arteries patent to the vertebrobasilar junction without stenosis. Right vertebral artery slightly dominant.  Both PICA origins patent and normal. Basilar patent to its distal aspect without stenosis. Superior cerebellar arteries are patent bilaterally. Both PCAs primarily supplied via the basilar well perfused to their distal aspects. Anatomic variants: Hypoplastic right A1 segment. No intracranial aneurysm or other vascular abnormality. MRA NECK FINDINGS Aortic arch: Visualized aortic arch normal in caliber with normal branch pattern. No hemodynamically significant stenosis seen about the origin of the great vessels. Right carotid system: Right common and internal carotid arteries patent without stenosis, evidence for dissection or occlusion. Tortuosity about the mid cervical right ICA noted. Left carotid system: Left CCA patent from its origin to the bifurcation without stenosis. Atheromatous change at the origin of the cervical left ICA with associated stenosis of up to 40-50% by NASCET criteria (series 1104, image 7). Cervical left ICA mildly tortuous but otherwise patent to the skull base without stenosis, evidence for dissection or occlusion. Vertebral arteries: Both vertebral arteries supplied via the subclavian arteries. No significant proximal subclavian artery stenosis. Right vertebral artery slightly dominant. Note made of a short-segment mild stenosis involving the proximal-mid left V2 segment (series 1122, image 11). Otherwise, vertebral arteries patent without stenosis, evidence for dissection or occlusion. Other: None IMPRESSION: MRI  HEAD IMPRESSION: 1. No acute intracranial abnormality. 2. Mild chronic microvascular ischemic disease for age. MRA HEAD IMPRESSION: 1. Negative intracranial MRA for large vessel occlusion. No hemodynamically significant or correctable stenosis. 2. Mild distal small vessel atheromatous irregularity. MRA NECK IMPRESSION: 1. Atherosclerotic change at the origin of the cervical left ICA with associated stenosis of up to 40-50% by NASCET criteria. 2. No significant stenosis within the right carotid artery system. 3. Short-segment mild stenosis involving the proximal-mid left V2 segment. Otherwise wide patency of the vertebral arteries within the neck. Right vertebral artery slightly dominant. Electronically Signed   By: Jeannine Boga M.D.   On: 03/30/2021 21:03   MR Angiogram Neck W or Wo Contrast  Result Date: 03/30/2021 CLINICAL DATA:  Initial evaluation for neuro deficit, stroke suspected. EXAM: MRI HEAD WITHOUT CONTRAST MRA HEAD WITHOUT CONTRAST MRA NECK WITHOUT AND WITH CONTRAST TECHNIQUE: Multiplanar, multi-echo pulse sequences of the brain and surrounding structures were acquired without intravenous contrast. Angiographic images of the Circle of Willis were acquired using MRA technique without intravenous contrast. Angiographic images of the neck were acquired using MRA technique without and with intravenous contrast. Carotid stenosis measurements (when applicable) are obtained utilizing NASCET criteria, using the distal internal carotid diameter as the denominator. CONTRAST:  8.55mL GADAVIST GADOBUTROL 1 MMOL/ML IV SOLN COMPARISON:  CT from earlier the same day as well as previous brain MRI from 04/17/2018. FINDINGS: MRI HEAD FINDINGS Brain: Cerebral volume within normal limits for age. Patchy T2/FLAIR hyperintensity involving the periventricular deep white matter both cerebral hemispheres, most consistent with chronic microvascular ischemic disease, mild for age. Minimal patchy involvement of the pons  noted. No abnormal foci of restricted diffusion to suggest acute or subacute ischemia. Gray-white matter differentiation maintained. No encephalomalacia to suggest chronic cortical infarction. No evidence for acute or chronic intracranial hemorrhage. No mass lesion, midline shift or mass effect. No hydrocephalus or extra-axial fluid collection. Pituitary gland suprasellar region normal. Midline structures intact and normal. Vascular: Major intracranial vascular flow voids are maintained. Skull and upper cervical spine: Degenerative osteoarthritic changes present about the C1-2 articulation, with associated thickening of the tectorial membrane. No high-grade stenosis at the craniocervical junction. Bone marrow signal intensity within normal limits. No scalp soft tissue abnormality. Sinuses/Orbits: Patient status post bilateral ocular lens  replacement. Globes and orbital soft tissues demonstrate no acute finding. Paranasal sinuses and mastoid air cells are clear. Inner ear structures grossly normal. Other: None. MRA HEAD FINDINGS Anterior circulation: Visualized distal cervical segments of the internal carotid arteries are patent with antegrade flow. Distal cervical left ICA tortuous. Petrous, cavernous, and supraclinoid segments widely patent without stenosis. Origins of the ophthalmic arteries patent and normal. A1 segments patent bilaterally. Right A1 hypoplastic. Normal anterior communicating artery complex. Anterior cerebral arteries patent to their distal aspects without stenosis. No M1 stenosis or occlusion. Normal MCA bifurcations. Distal MCA branches well perfused and symmetric. Mild distal small vessel atheromatous irregularity. Posterior circulation: Vertebral arteries patent to the vertebrobasilar junction without stenosis. Right vertebral artery slightly dominant. Both PICA origins patent and normal. Basilar patent to its distal aspect without stenosis. Superior cerebellar arteries are patent bilaterally.  Both PCAs primarily supplied via the basilar well perfused to their distal aspects. Anatomic variants: Hypoplastic right A1 segment. No intracranial aneurysm or other vascular abnormality. MRA NECK FINDINGS Aortic arch: Visualized aortic arch normal in caliber with normal branch pattern. No hemodynamically significant stenosis seen about the origin of the great vessels. Right carotid system: Right common and internal carotid arteries patent without stenosis, evidence for dissection or occlusion. Tortuosity about the mid cervical right ICA noted. Left carotid system: Left CCA patent from its origin to the bifurcation without stenosis. Atheromatous change at the origin of the cervical left ICA with associated stenosis of up to 40-50% by NASCET criteria (series 1104, image 7). Cervical left ICA mildly tortuous but otherwise patent to the skull base without stenosis, evidence for dissection or occlusion. Vertebral arteries: Both vertebral arteries supplied via the subclavian arteries. No significant proximal subclavian artery stenosis. Right vertebral artery slightly dominant. Note made of a short-segment mild stenosis involving the proximal-mid left V2 segment (series 1122, image 11). Otherwise, vertebral arteries patent without stenosis, evidence for dissection or occlusion. Other: None IMPRESSION: MRI HEAD IMPRESSION: 1. No acute intracranial abnormality. 2. Mild chronic microvascular ischemic disease for age. MRA HEAD IMPRESSION: 1. Negative intracranial MRA for large vessel occlusion. No hemodynamically significant or correctable stenosis. 2. Mild distal small vessel atheromatous irregularity. MRA NECK IMPRESSION: 1. Atherosclerotic change at the origin of the cervical left ICA with associated stenosis of up to 40-50% by NASCET criteria. 2. No significant stenosis within the right carotid artery system. 3. Short-segment mild stenosis involving the proximal-mid left V2 segment. Otherwise wide patency of the  vertebral arteries within the neck. Right vertebral artery slightly dominant. Electronically Signed   By: Jeannine Boga M.D.   On: 03/30/2021 21:03   MR BRAIN WO CONTRAST  Result Date: 03/30/2021 CLINICAL DATA:  Initial evaluation for neuro deficit, stroke suspected. EXAM: MRI HEAD WITHOUT CONTRAST MRA HEAD WITHOUT CONTRAST MRA NECK WITHOUT AND WITH CONTRAST TECHNIQUE: Multiplanar, multi-echo pulse sequences of the brain and surrounding structures were acquired without intravenous contrast. Angiographic images of the Circle of Willis were acquired using MRA technique without intravenous contrast. Angiographic images of the neck were acquired using MRA technique without and with intravenous contrast. Carotid stenosis measurements (when applicable) are obtained utilizing NASCET criteria, using the distal internal carotid diameter as the denominator. CONTRAST:  8.69mL GADAVIST GADOBUTROL 1 MMOL/ML IV SOLN COMPARISON:  CT from earlier the same day as well as previous brain MRI from 04/17/2018. FINDINGS: MRI HEAD FINDINGS Brain: Cerebral volume within normal limits for age. Patchy T2/FLAIR hyperintensity involving the periventricular deep white matter both cerebral hemispheres, most consistent with chronic  microvascular ischemic disease, mild for age. Minimal patchy involvement of the pons noted. No abnormal foci of restricted diffusion to suggest acute or subacute ischemia. Gray-white matter differentiation maintained. No encephalomalacia to suggest chronic cortical infarction. No evidence for acute or chronic intracranial hemorrhage. No mass lesion, midline shift or mass effect. No hydrocephalus or extra-axial fluid collection. Pituitary gland suprasellar region normal. Midline structures intact and normal. Vascular: Major intracranial vascular flow voids are maintained. Skull and upper cervical spine: Degenerative osteoarthritic changes present about the C1-2 articulation, with associated thickening of the  tectorial membrane. No high-grade stenosis at the craniocervical junction. Bone marrow signal intensity within normal limits. No scalp soft tissue abnormality. Sinuses/Orbits: Patient status post bilateral ocular lens replacement. Globes and orbital soft tissues demonstrate no acute finding. Paranasal sinuses and mastoid air cells are clear. Inner ear structures grossly normal. Other: None. MRA HEAD FINDINGS Anterior circulation: Visualized distal cervical segments of the internal carotid arteries are patent with antegrade flow. Distal cervical left ICA tortuous. Petrous, cavernous, and supraclinoid segments widely patent without stenosis. Origins of the ophthalmic arteries patent and normal. A1 segments patent bilaterally. Right A1 hypoplastic. Normal anterior communicating artery complex. Anterior cerebral arteries patent to their distal aspects without stenosis. No M1 stenosis or occlusion. Normal MCA bifurcations. Distal MCA branches well perfused and symmetric. Mild distal small vessel atheromatous irregularity. Posterior circulation: Vertebral arteries patent to the vertebrobasilar junction without stenosis. Right vertebral artery slightly dominant. Both PICA origins patent and normal. Basilar patent to its distal aspect without stenosis. Superior cerebellar arteries are patent bilaterally. Both PCAs primarily supplied via the basilar well perfused to their distal aspects. Anatomic variants: Hypoplastic right A1 segment. No intracranial aneurysm or other vascular abnormality. MRA NECK FINDINGS Aortic arch: Visualized aortic arch normal in caliber with normal branch pattern. No hemodynamically significant stenosis seen about the origin of the great vessels. Right carotid system: Right common and internal carotid arteries patent without stenosis, evidence for dissection or occlusion. Tortuosity about the mid cervical right ICA noted. Left carotid system: Left CCA patent from its origin to the bifurcation without  stenosis. Atheromatous change at the origin of the cervical left ICA with associated stenosis of up to 40-50% by NASCET criteria (series 1104, image 7). Cervical left ICA mildly tortuous but otherwise patent to the skull base without stenosis, evidence for dissection or occlusion. Vertebral arteries: Both vertebral arteries supplied via the subclavian arteries. No significant proximal subclavian artery stenosis. Right vertebral artery slightly dominant. Note made of a short-segment mild stenosis involving the proximal-mid left V2 segment (series 1122, image 11). Otherwise, vertebral arteries patent without stenosis, evidence for dissection or occlusion. Other: None IMPRESSION: MRI HEAD IMPRESSION: 1. No acute intracranial abnormality. 2. Mild chronic microvascular ischemic disease for age. MRA HEAD IMPRESSION: 1. Negative intracranial MRA for large vessel occlusion. No hemodynamically significant or correctable stenosis. 2. Mild distal small vessel atheromatous irregularity. MRA NECK IMPRESSION: 1. Atherosclerotic change at the origin of the cervical left ICA with associated stenosis of up to 40-50% by NASCET criteria. 2. No significant stenosis within the right carotid artery system. 3. Short-segment mild stenosis involving the proximal-mid left V2 segment. Otherwise wide patency of the vertebral arteries within the neck. Right vertebral artery slightly dominant. Electronically Signed   By: Jeannine Boga M.D.   On: 03/30/2021 21:03    Assessment/Plan 1. Fall at home, initial encounter Status post fall 04/03/2021 sustained right elbow Skin tear did not hit head or any loss of consciousness.will recheck CBC follow  up from ED  Refer to [facility] Physical Therapist for gait stability,ROM,Exercise and muscle strengthening.  - CBC with Differential/Platelet - Ambulatory referral to [place]  2. Unsteady gait Status post fall as above  Home Health PT referral  - CBC with  Differential/Platelet - Ambulatory referral to [place]  3. Skin tear of right elbow without complication, initial encounter Afebrile. Right elbow skin Tear without any skin flap 7 cm X 2 cm wound bed red with small area bleeding.Pressure applied to small bleeding site.wound cleansed with saline wound cleanser,pat dry,Triple antibiotic Ointment applied covered with foam dressing for absorption and extra protection.Tolerated procedure well.   Dressing changes given to wife on AVS: cleanse right elbow skin tear with saline ,pat dry,apply triple antibiotic ointment,cover with foam dressing for absorption and protection.change dressing daily and as needed if soiled.   Advised to Notify provider for any signs of infection ( redness,odor or drainage).    - Follow up with Dr.Gupta or Wert C. NP at [facility] 1-2 weeks for evaluation of skin Tear.   Family/ staff Communication: Reviewed plan of care with patient and Wife verbalized understanding.   Labs/tests ordered: - CBC with Differential/Platelet  Next Appointment: 2 weeks for right elbow skin Tear evaluation.  Sandrea Hughs, NP

## 2021-04-08 DIAGNOSIS — N390 Urinary tract infection, site not specified: Secondary | ICD-10-CM | POA: Diagnosis not present

## 2021-04-09 ENCOUNTER — Other Ambulatory Visit (INDEPENDENT_AMBULATORY_CARE_PROVIDER_SITE_OTHER): Payer: Medicare Other

## 2021-04-09 DIAGNOSIS — Z23 Encounter for immunization: Secondary | ICD-10-CM

## 2021-04-10 DIAGNOSIS — Z23 Encounter for immunization: Secondary | ICD-10-CM | POA: Diagnosis not present

## 2021-04-13 ENCOUNTER — Encounter: Payer: Medicare Other | Admitting: Adult Health

## 2021-04-15 NOTE — Progress Notes (Signed)
Subjective:    Patient ID: [name], male    DOB: [birth year], 87 y.o.   MRN: [id number]  This visit occurred during the SARS-CoV-2 public health emergency.  Safety protocols were in place, including screening questions prior to the visit, additional usage of staff PPE, and extensive cleaning of exam room while observing appropriate contact time as indicated for disinfecting solutions.     HPI He is here to establish with a new pcp.   The patient is here for follow up of their chronic medical problems, including dementia, htn, hld, DM,  OSA, hypothyroid, CKD.  He is here with his wife.    Was experiencing some confusion  and had imaging, urine and blood work - w/u negative - confusion has resolved.  W/u neg.  Back to baseline.    He fell when he was confused and has been walking with a walker - he wonders if he can stop using a walker.     Medications and allergies reviewed with patient and updated if appropriate.  Patient Active Problem List   Diagnosis Date Noted   Abnormal colonoscopy 12/23/2019   Chronic kidney disease (CKD), stage III (moderate) ([place]) 07/24/2019   MCI (mild cognitive impairment) with memory loss 07/08/2018   Driving safety issue 05/24/2018   Change in bowel habits 04/02/2018   Fecal urgency 04/02/2018   Flatus 04/02/2018   Controlled type 2 diabetes mellitus with both eyes affected by mild nonproliferative retinopathy without macular edema, with long-term current use of insulin ([place]) 01/24/2018   Depression, major, single episode, mild ([place]) 01/24/2018   Constipation 09/09/2017   UTI (urinary tract infection) 02/25/2017   Hypothyroidism 12/10/2015   Cervical neck pain with evidence of disc disease 11/12/2015   Tendinopathy of left rotator cuff 04/16/2015   Hamstring muscle strain 04/16/2015   Abdominal pain, unspecified site 03/12/2014   Diabetes mellitus with renal manifestation ([place]) 11/17/2012   Bicipital tendinitis of right shoulder  11/08/2012   Paresthesia 04/04/2012   Heel pain 11/30/2011   Insomnia 01/20/2011   Encounter for long-term (current) use of other medications 10/15/2010   EDEMA 04/08/2010   OSTEOARTHRITIS, HIP 03/05/2010   UNEQUAL LEG LENGTH 03/05/2010   LEG CRAMPS 02/17/2010   SYNCOPE 12/31/2009   CAROTID BRUIT, LEFT 12/31/2009   OBSTRUCTIVE SLEEP APNEA 11/05/2009   GLAUCOMA 03/08/2009   THYROID NODULE, RIGHT 11/13/2008   Bradycardia 11/13/2008   Thrombocytopenia ([place]) 04/17/2008   Dyslipidemia 12/04/2007   FOREIGN BODY, ASPIRATION 12/04/2007   DEPRESSION 02/04/2007   HTN (hypertension) 02/04/2007   DIVERTICULOSIS, COLON 02/04/2007   OSTEOARTHRITIS 02/04/2007   COLONIC POLYPS, HX OF 02/04/2007    Current Outpatient Medications on File Prior to Visit  Medication Sig Dispense Refill   Cholecalciferol (VITAMIN D) 2000 UNITS CAPS Take 2 capsules by mouth daily.     ferrous sulfate 325 (65 FE) MG EC tablet Take 1 tablet (325 mg total) by mouth every other day. 15 tablet 3   levothyroxine (SYNTHROID) 25 MCG tablet Take one tablet by mouth once daily 30 minutes before breakfast on empty stomach. 90 tablet 1   lisinopril (ZESTRIL) 5 MG tablet Take one tablet by mouth once daily. 90 tablet 1   polycarbophil (FIBERCON) 625 MG tablet Take 625 mg by mouth daily.     polyethylene glycol (MIRALAX / GLYCOLAX) 17 g packet Take 17 g by mouth daily. Take 1 to 2 capfuls daily at noon     rosuvastatin (CRESTOR) 40 MG tablet TAKE  ONE TABLET BY MOUTH EVERY DAY 90 tablet 0   traZODone (DESYREL) 100 MG tablet TAKE ONE TABLET AT BEDTIME. 30 tablet 11   No current facility-administered medications on file prior to visit.    Past Medical History:  Diagnosis Date   Anxiety    BRADYCARDIA 11/13/2008   Cataract    Cholelithiasis    COLONIC POLYPS, HX OF 02/04/2007   DEPRESSION 02/04/2007   DIABETES MELLITUS, TYPE II 02/04/2007   DIVERTICULOSIS, COLON 02/04/2007   FOREIGN BODY, ASPIRATION 12/04/2007   GERD  (gastroesophageal reflux disease)    GLAUCOMA 03/08/2009   Hepatitis A 1963   HYPERLIPIDEMIA 12/04/2007   HYPERTENSION 02/04/2007   Memory loss    OBSTRUCTIVE SLEEP APNEA 11/05/2009   -PSG 01/05/10 RDI 11, PLMI 56   OSTEOARTHRITIS 02/04/2007   Peptic stricture of esophagus    PROSTATE CANCER 1997   RENAL INSUFFICIENCY 11/13/2008   SYNCOPE 12/31/2009   THROMBOCYTOPENIA 04/17/2008   THYROID NODULE, RIGHT 11/13/2008   UNSPECIFIED ANEMIA 11/07/2007    Past Surgical History:  Procedure Laterality Date   Heart Cartherization  2000   PROSTATECTOMY  1997   TONSILLECTOMY  1940's    Social History   Socioeconomic History   Marital status: Married    Spouse name: [name]   Number of children: 3   Years of education: college   Highest education level: Bachelor's degree (e.g., BA, AB, BS)  Occupational History   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   Right-handed.   2 cups caffeine per day.      Social Determinants of Health   Financial Resource Strain: Not on file  Food Insecurity: Not on file  Transportation Needs: Not on file  Physical Activity: Not on file  Stress: Not on file  Social Connections: Not on file    Family History  Problem Relation Age of Onset   Kidney disease Father        Bright's Disease - died at age 62   Other Mother        no signifincant health problems - died at age 77   Heart disease Neg Hx        No FH of Coronary Artery Disease   Cancer Neg Hx    Colon cancer Neg Hx    Esophageal cancer Neg  Hx    Inflammatory bowel disease Neg Hx    Liver disease Neg Hx    Pancreatic cancer Neg Hx    Rectal cancer Neg Hx    Stomach cancer Neg Hx     Review of Systems  Constitutional:  Negative for chills and fever.  Eyes:  Negative for visual disturbance.  Respiratory:  Negative for cough, shortness of breath and wheezing.   Cardiovascular:  Positive for leg swelling (chronic). Negative for chest pain and palpitations.  Gastrointestinal:  Positive for constipation (take miralax occ). Negative for abdominal pain and nausea.       No  gerd  Musculoskeletal:  Positive for back pain. Negative for arthralgias.  Neurological:  Positive for dizziness (with quick movements, changes in position). Negative for light-headedness and headaches.      Objective:   Vitals:   04/16/21 1014  BP: 138/68  Pulse: 62  Temp: 98.6 F (37 C)  SpO2: 97%   BP Readings from Last 3 Encounters:  04/16/21 138/68  04/06/21 (!) 150/80  03/30/21 (!) 172/91   Wt Readings from Last 3 Encounters:  04/16/21 190 lb (86.2 kg)  04/06/21 194 lb 9.6 oz (88.3 kg)  03/30/21 190 lb (86.2 kg)   Body mass index is 26.5 kg/m.   Physical Exam    Constitutional: Appears well-developed and well-nourished. No distress.  Head: Normocephalic and atraumatic.  Neck: Neck supple. No tracheal deviation present. No thyromegaly present.  No cervical lymphadenopathy Cardiovascular: Normal rate, regular rhythm and normal heart sounds.  No murmur heard. No carotid bruit .  No edema Pulmonary/Chest: Effort normal and breath sounds normal. No respiratory distress. No has no wheezes. No rales.  Abdomen: soft, NT, ND Skin: Skin is warm and dry. Not diaphoretic.  Psychiatric: Normal mood and affect. Behavior is normal.       Assessment & Plan:    See Problem List for Assessment and Plan of chronic medical problems.

## 2021-04-16 ENCOUNTER — Ambulatory Visit (INDEPENDENT_AMBULATORY_CARE_PROVIDER_SITE_OTHER): Payer: Medicare Other | Admitting: Internal Medicine

## 2021-04-16 ENCOUNTER — Other Ambulatory Visit: Payer: Self-pay

## 2021-04-16 ENCOUNTER — Encounter: Payer: Self-pay | Admitting: Internal Medicine

## 2021-04-16 VITALS — BP 138/68 | HR 62 | Temp 98.6°F | Ht 71.0 in | Wt 190.0 lb

## 2021-04-16 DIAGNOSIS — N1831 Chronic kidney disease, stage 3a: Secondary | ICD-10-CM

## 2021-04-16 DIAGNOSIS — I10 Essential (primary) hypertension: Secondary | ICD-10-CM | POA: Diagnosis not present

## 2021-04-16 DIAGNOSIS — N1832 Chronic kidney disease, stage 3b: Secondary | ICD-10-CM | POA: Diagnosis not present

## 2021-04-16 DIAGNOSIS — G4709 Other insomnia: Secondary | ICD-10-CM | POA: Diagnosis not present

## 2021-04-16 DIAGNOSIS — G3184 Mild cognitive impairment, so stated: Secondary | ICD-10-CM | POA: Diagnosis not present

## 2021-04-16 DIAGNOSIS — E1122 Type 2 diabetes mellitus with diabetic chronic kidney disease: Secondary | ICD-10-CM

## 2021-04-16 DIAGNOSIS — E039 Hypothyroidism, unspecified: Secondary | ICD-10-CM

## 2021-04-16 DIAGNOSIS — E785 Hyperlipidemia, unspecified: Secondary | ICD-10-CM

## 2021-04-16 NOTE — Assessment & Plan Note (Signed)
Chronic  Clinically euthyroid Lab Results  Component Value Date   TSH 5.72 11/18/2020    Currently taking levothyroxine 25 mcg daily

## 2021-04-16 NOTE — Patient Instructions (Addendum)
  Medications changes include :   none  Your prescription(s) have been submitted to your pharmacy. Please take as directed and contact our office if you believe you are having problem(s) with the medication(s).    Please followup in 6 months   

## 2021-04-16 NOTE — Assessment & Plan Note (Signed)
Chronic Mild Does his own ADLs Resume daily walking

## 2021-04-16 NOTE — Assessment & Plan Note (Signed)
Chronic Lab Results  Component Value Date   HGBA1C 6.2 11/18/2020   Well controlled with diet

## 2021-04-16 NOTE — Assessment & Plan Note (Signed)
Chronic Controlled, stable Continue trazodone 100 mg HS  

## 2021-04-16 NOTE — Assessment & Plan Note (Signed)
chronic stable ?

## 2021-04-16 NOTE — Assessment & Plan Note (Signed)
Chronic BP well controlled Continue lisinopril 5 mg daily

## 2021-04-27 ENCOUNTER — Encounter: Payer: Medicare Other | Admitting: Adult Health

## 2021-05-04 DIAGNOSIS — D3131 Benign neoplasm of right choroid: Secondary | ICD-10-CM | POA: Diagnosis not present

## 2021-05-04 DIAGNOSIS — H353131 Nonexudative age-related macular degeneration, bilateral, early dry stage: Secondary | ICD-10-CM | POA: Diagnosis not present

## 2021-05-04 DIAGNOSIS — H52203 Unspecified astigmatism, bilateral: Secondary | ICD-10-CM | POA: Diagnosis not present

## 2021-05-04 DIAGNOSIS — H35372 Puckering of macula, left eye: Secondary | ICD-10-CM | POA: Diagnosis not present

## 2021-05-05 DIAGNOSIS — Z9181 History of falling: Secondary | ICD-10-CM | POA: Diagnosis not present

## 2021-05-05 DIAGNOSIS — R293 Abnormal posture: Secondary | ICD-10-CM | POA: Diagnosis not present

## 2021-05-05 DIAGNOSIS — W1849XS Other slipping, tripping and stumbling without falling, sequela: Secondary | ICD-10-CM | POA: Diagnosis not present

## 2021-05-05 DIAGNOSIS — R2689 Other abnormalities of gait and mobility: Secondary | ICD-10-CM | POA: Diagnosis not present

## 2021-05-05 DIAGNOSIS — R2681 Unsteadiness on feet: Secondary | ICD-10-CM | POA: Diagnosis not present

## 2021-05-05 DIAGNOSIS — R278 Other lack of coordination: Secondary | ICD-10-CM | POA: Diagnosis not present

## 2021-05-11 ENCOUNTER — Encounter: Payer: Self-pay | Admitting: Adult Health

## 2021-05-12 DIAGNOSIS — R2681 Unsteadiness on feet: Secondary | ICD-10-CM | POA: Diagnosis not present

## 2021-05-12 DIAGNOSIS — R2689 Other abnormalities of gait and mobility: Secondary | ICD-10-CM | POA: Diagnosis not present

## 2021-05-12 DIAGNOSIS — Z9181 History of falling: Secondary | ICD-10-CM | POA: Diagnosis not present

## 2021-05-12 DIAGNOSIS — R293 Abnormal posture: Secondary | ICD-10-CM | POA: Diagnosis not present

## 2021-05-12 DIAGNOSIS — W1849XS Other slipping, tripping and stumbling without falling, sequela: Secondary | ICD-10-CM | POA: Diagnosis not present

## 2021-05-12 DIAGNOSIS — R278 Other lack of coordination: Secondary | ICD-10-CM | POA: Diagnosis not present

## 2021-05-13 ENCOUNTER — Encounter: Payer: Self-pay | Admitting: Internal Medicine

## 2021-05-15 DIAGNOSIS — H43813 Vitreous degeneration, bilateral: Secondary | ICD-10-CM | POA: Diagnosis not present

## 2021-05-15 DIAGNOSIS — H35373 Puckering of macula, bilateral: Secondary | ICD-10-CM | POA: Diagnosis not present

## 2021-05-15 DIAGNOSIS — E113293 Type 2 diabetes mellitus with mild nonproliferative diabetic retinopathy without macular edema, bilateral: Secondary | ICD-10-CM | POA: Diagnosis not present

## 2021-05-15 DIAGNOSIS — D3132 Benign neoplasm of left choroid: Secondary | ICD-10-CM | POA: Diagnosis not present

## 2021-05-15 DIAGNOSIS — Z961 Presence of intraocular lens: Secondary | ICD-10-CM | POA: Diagnosis not present

## 2021-05-19 ENCOUNTER — Other Ambulatory Visit: Payer: Self-pay | Admitting: Internal Medicine

## 2021-05-19 DIAGNOSIS — F320 Major depressive disorder, single episode, mild: Secondary | ICD-10-CM

## 2021-05-19 DIAGNOSIS — R278 Other lack of coordination: Secondary | ICD-10-CM | POA: Diagnosis not present

## 2021-05-19 DIAGNOSIS — W1849XS Other slipping, tripping and stumbling without falling, sequela: Secondary | ICD-10-CM | POA: Diagnosis not present

## 2021-05-19 DIAGNOSIS — Z9181 History of falling: Secondary | ICD-10-CM | POA: Diagnosis not present

## 2021-05-19 DIAGNOSIS — R293 Abnormal posture: Secondary | ICD-10-CM | POA: Diagnosis not present

## 2021-05-19 DIAGNOSIS — R2689 Other abnormalities of gait and mobility: Secondary | ICD-10-CM | POA: Diagnosis not present

## 2021-05-19 DIAGNOSIS — R2681 Unsteadiness on feet: Secondary | ICD-10-CM | POA: Diagnosis not present

## 2021-05-23 ENCOUNTER — Other Ambulatory Visit: Payer: Self-pay | Admitting: Internal Medicine

## 2021-05-25 ENCOUNTER — Other Ambulatory Visit: Payer: Self-pay | Admitting: Internal Medicine

## 2021-05-25 ENCOUNTER — Other Ambulatory Visit: Payer: Self-pay | Admitting: Family

## 2021-06-02 DIAGNOSIS — R293 Abnormal posture: Secondary | ICD-10-CM | POA: Diagnosis not present

## 2021-06-02 DIAGNOSIS — R2681 Unsteadiness on feet: Secondary | ICD-10-CM | POA: Diagnosis not present

## 2021-06-02 DIAGNOSIS — Z9181 History of falling: Secondary | ICD-10-CM | POA: Diagnosis not present

## 2021-06-02 DIAGNOSIS — W1849XS Other slipping, tripping and stumbling without falling, sequela: Secondary | ICD-10-CM | POA: Diagnosis not present

## 2021-06-02 DIAGNOSIS — R2689 Other abnormalities of gait and mobility: Secondary | ICD-10-CM | POA: Diagnosis not present

## 2021-06-02 DIAGNOSIS — R278 Other lack of coordination: Secondary | ICD-10-CM | POA: Diagnosis not present

## 2021-06-08 ENCOUNTER — Encounter: Payer: Self-pay | Admitting: Cardiovascular Disease

## 2021-06-08 ENCOUNTER — Ambulatory Visit (INDEPENDENT_AMBULATORY_CARE_PROVIDER_SITE_OTHER): Payer: Medicare Other | Admitting: Cardiovascular Disease

## 2021-06-08 ENCOUNTER — Other Ambulatory Visit: Payer: Self-pay

## 2021-06-08 ENCOUNTER — Ambulatory Visit (INDEPENDENT_AMBULATORY_CARE_PROVIDER_SITE_OTHER): Payer: Medicare Other

## 2021-06-08 VITALS — BP 134/68 | HR 56 | Ht 71.0 in | Wt 191.6 lb

## 2021-06-08 DIAGNOSIS — R55 Syncope and collapse: Secondary | ICD-10-CM

## 2021-06-08 DIAGNOSIS — I10 Essential (primary) hypertension: Secondary | ICD-10-CM | POA: Diagnosis not present

## 2021-06-08 DIAGNOSIS — E782 Mixed hyperlipidemia: Secondary | ICD-10-CM

## 2021-06-08 NOTE — Progress Notes (Unsigned)
Enrolled for [employer] to mail a ZIO XT long term holter monitor to the patients address on file.  

## 2021-06-08 NOTE — Progress Notes (Signed)
Cardiology Office Note:    Date:  06/08/2021   ID:  [name], DOB 1927-07-28, MRN [id number]  PCP:  Binnie Rail, MD   [facility] Providers Cardiologist:  Sherren Mocha, MD     Referring MD: Virgie Dad, MD   Chief Complaint  Patient presents with   Hypertension    History of Present Illness:    [name] is a 87 y.o. male with a hx of hypertension and syncope, presenting for follow-up evaluation.  He is here with his wife today.  The patient has had episodes of presyncope of late.  He has experienced postural lightheadedness and weakness when getting from the bed to stand up at night to use the bathroom.  He has not experienced lightheadedness or syncope without association with postural change.  The patient was evaluated in the emergency department in October of this year.  An MRI of the brain demonstrated no evidence of stroke.  He presents today for follow-up evaluation.  He denies chest pain, chest pressure, heart palpitations, orthopnea, or PND.  He has mild dyspnea with exertion but overall feels that this is quite stable.  He exercises on the treadmill without exertional dyspnea at the pace he walks.  Past Medical History:  Diagnosis Date   Anxiety    BRADYCARDIA 11/13/2008   Cataract    Cholelithiasis    COLONIC POLYPS, HX OF 02/04/2007   DEPRESSION 02/04/2007   DIABETES MELLITUS, TYPE II 02/04/2007   DIVERTICULOSIS, COLON 02/04/2007   FOREIGN BODY, ASPIRATION 12/04/2007   GERD (gastroesophageal reflux disease)    GLAUCOMA 03/08/2009   Hepatitis A 1963   HYPERLIPIDEMIA 12/04/2007   HYPERTENSION 02/04/2007   Memory loss    OBSTRUCTIVE SLEEP APNEA 11/05/2009   -PSG 01/05/10 RDI 11, PLMI 56   OSTEOARTHRITIS 02/04/2007   Peptic stricture of esophagus    PROSTATE CANCER 1997   RENAL INSUFFICIENCY 11/13/2008   SYNCOPE 12/31/2009   THROMBOCYTOPENIA 04/17/2008   THYROID NODULE, RIGHT 11/13/2008   UNSPECIFIED ANEMIA 11/07/2007    Past Surgical History:  Procedure  Laterality Date   Heart Cartherization  2000   PROSTATECTOMY  1997   TONSILLECTOMY  1940's    Current Medications: Current Meds  Medication Sig   Cholecalciferol (VITAMIN D) 2000 UNITS CAPS Take 2 capsules by mouth daily.   ferrous sulfate 325 (65 FE) MG EC tablet Take 1 tablet (325 mg total) by mouth every other day.   levothyroxine (SYNTHROID) 25 MCG tablet Take one tablet by mouth once daily 30 minutes before breakfast on empty stomach.   polycarbophil (FIBERCON) 625 MG tablet Take 625 mg by mouth daily.   polyethylene glycol (MIRALAX / GLYCOLAX) 17 g packet Take 17 g by mouth daily. Take 1 to 2 capfuls daily at noon   rosuvastatin (CRESTOR) 40 MG tablet TAKE ONE TABLET BY MOUTH EVERY DAY   traZODone (DESYREL) 100 MG tablet TAKE ONE TABLET AT BEDTIME.   [DISCONTINUED] lisinopril (ZESTRIL) 5 MG tablet Take one tablet by mouth once daily.     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   Right-handed.   2 cups caffeine per day.      Social Determinants of Health   Financial Resource Strain: Not on file  Food Insecurity: Not on file  Transportation Needs: Not on file  Physical Activity: Not on file   Stress: Not on file  Social Connections: Not on file     Family History: The patient's family history includes Kidney disease in his father; Other in his mother. There is no history of Heart disease, Cancer, Colon cancer, Esophageal cancer, Inflammatory bowel disease, Liver disease, Pancreatic cancer, Rectal cancer, or Stomach cancer.  ROS:   Please see the history of present illness.    All other systems reviewed and are negative.  EKGs/Labs/Other Studies Reviewed:    The following studies were reviewed today: Echo 05/01/2018: Study Conclusions   - Left ventricle: The cavity size was normal. Systolic function was    normal. The estimated ejection fraction was in the range of 55%    to 60%. Wall motion was normal; there were no regional wall    motion abnormalities. There was an increased relative    contribution of atrial contraction to ventricular filling.    Doppler parameters are consistent with abnormal left ventricular    relaxation (grade 1 diastolic dysfunction). Indeterminate mean    left atrial filling pressure.  - Aortic valve: There was mild regurgitation.  - Left atrium: The atrium was mildly dilated.  - Right ventricle: The cavity size was mildly dilated. Wall    thickness was normal.  - Right atrium: The atrium was mildly dilated.   EKG:  EKG is not ordered today.  The EKG from 03/30/2021 shows normal sinus rhythm with first-degree AV block.  Recent Labs: 11/18/2020: ALT 14; TSH 5.72 03/30/2021: BUN 42; Creatinine, Ser 1.98; Potassium 4.7; Sodium 140 04/06/2021: Hemoglobin 13.2; Platelets 199  Recent Lipid Panel    Component Value Date/Time   CHOL 116 07/17/2020 0000   TRIG 104 07/17/2020 0000   TRIG 48 04/05/2006 0750   HDL 34 (A) 07/17/2020 0000   CHOLHDL 4 11/22/2017 1003   VLDL 32.6 11/22/2017 1003   LDLCALC 62 07/17/2020 0000     Risk Assessment/Calculations:           Physical Exam:    VS:  BP 134/68   Pulse (!) 56   Ht 5\' 11"  (1.803 m)    Wt 191 lb 9.6 oz (86.9 kg)   SpO2 95%   BMI 26.72 kg/m     Wt Readings from Last 3 Encounters:  06/08/21 191 lb 9.6 oz (86.9 kg)  04/16/21 190 lb (86.2 kg)  04/06/21 194 lb 9.6 oz (88.3 kg)     GEN:  Well nourished, well developed pleasant elderly male in no acute distress HEENT: Normal NECK: No JVD; No carotid bruits LYMPHATICS: No lymphadenopathy CARDIAC: RRR, no murmurs, rubs, gallops RESPIRATORY:  Clear to auscultation without rales, wheezing or rhonchi  ABDOMEN: Soft, non-tender, non-distended MUSCULOSKELETAL: Trace bilateral ankle edema; No deformity  SKIN: Warm and dry  NEUROLOGIC:  Alert and oriented x 3 PSYCHIATRIC:  Normal affect   ASSESSMENT:    1. Syncope, unspecified syncope type   2. Syncope and collapse   3. Essential hypertension   4. Mixed hyperlipidemia    PLAN:    In order of problems listed above:  The patient is having clear postural symptoms.  He is advised to increase fluids.  Considering his advanced age and first-degree AV block, I have recommended a 14-day ZIO monitor to evaluate for AV block or significant arrhythmia.  I have recommended an echocardiogram to assess for LV dysfunction or valvular problems.  I have recommended that he discontinue lisinopril and monitor blood pressure 2 to 3 days/week. As above Blood pressure 134/68 today.  I recommended that he stop lisinopril in the setting of his postural symptoms.  Considering his advanced age, I would tolerate a systolic blood pressure less than 150 mmHg. Treated with a statin drug.  Last lipids from January 2022 showed an LDL cholesterol of 62 mg/dL.           Medication Adjustments/Labs and Tests Ordered: Current medicines are reviewed at length with the patient today.  Concerns regarding medicines are outlined above.  Orders Placed This Encounter  Procedures   LONG TERM MONITOR (3-14 DAYS)   ECHOCARDIOGRAM COMPLETE    No orders of the defined types were placed in this  encounter.   Patient Instructions  Medication Instructions:  1) DISCONTINUE Lisinopril.  Monitor your blood pressure 2-3 times per week.  Let us know if you are seeing the blood pressure consistently too high (top number above 140, bottom above 90).  *If you need a refill on your cardiac medications before your next appointment, please call your pharmacy*   Lab Work: None If you have labs (blood work) drawn today and your tests are completely normal, you will receive your results only by: [place] (if you have MyChart) OR A paper copy in the mail If you have any lab test that is abnormal or we need to change your treatment, we will call you to review the results.   Testing/Procedures: Your physician has requested that you have an echocardiogram. Echocardiography is a painless test that uses sound waves to create images of your heart. It provides your doctor with information about the size and shape of your heart and how well your heart's chambers and valves are working. This procedure takes approximately one hour. There are no restrictions for this procedure.  Your physician recommends that you wear a monitor for 2 weeks.   Follow-Up: At [facility], you and your health needs are our priority.  As part of our continuing mission to provide you with exceptional heart care, we have created designated Provider Care Teams.  These Care Teams include your primary Cardiologist (physician) and Advanced Practice Providers (APPs -  Physician Assistants and Nurse Practitioners) who all work together to provide you with the care you need, when you need it.  We recommend signing up for the patient portal called "MyChart".  Sign up information is provided on this After Visit Summary.  MyChart is used to connect with patients for Virtual Visits (Telemedicine).  Patients are able to view lab/test results, encounter notes, upcoming appointments, etc.  Non-urgent messages can be sent to your  provider as well.   To learn more about what you can do with MyChart, go to [email or web address].    Your next appointment:   6 month(s)  The format for your next appointment:  In Person  Provider:   Sherren Mocha, MD     Other Instructions  Bryn Gulling- Long Term Monitor Instructions  Your physician has requested you wear a ZIO patch monitor for 14 days.  This is a single patch monitor. [employer] supplies one patch monitor per enrollment. Additional stickers are not available. Please do not apply patch if you will be having a Nuclear Stress Test,  Echocardiogram, Cardiac CT, MRI, or Chest Xray during the period you would be wearing the  monitor. The patch cannot be worn during these tests. You cannot remove and re-apply the  ZIO XT patch monitor.  Your ZIO patch monitor will be mailed 3 day USPS to your address on file. It may take 3-5 days  to receive your monitor after you have been enrolled.  Once you have received your monitor, please review the enclosed instructions. Your monitor  has already been registered assigning a specific monitor serial # to you.  Billing and Patient Assistance Program Information  We have supplied [employer] with any of your insurance information on file for billing purposes. [employer] offers a sliding scale Patient Assistance Program for patients that do not have  insurance, or whose insurance does not completely cover the cost of the ZIO monitor.  You must apply for the Patient Assistance Program to qualify for this discounted rate.  To apply, please call [employer] at [phone], select option 4, select option 2, ask to apply for  Patient Assistance Program. Theodore Demark will ask your household income, and how many people  are in your household. They will quote your out-of-pocket cost based on that information.  [employer] will also be able to set up a 62-month, interest-free payment plan if needed.  Applying the monitor   Shave hair from upper left chest.   Hold abrader disc by orange tab. Rub abrader in 40 strokes over the upper left chest as  indicated in your monitor instructions.  Clean area with 4 enclosed alcohol pads. Let dry.  Apply patch as indicated in monitor instructions. Patch will be placed under collarbone on left  side of chest with arrow pointing upward.  Rub patch adhesive wings for 2 minutes. Remove white label marked "1". Remove the white  label marked "2". Rub patch adhesive wings for 2 additional minutes.  While looking in a mirror, press and release button in center of patch. A small green light will  flash 3-4 times. This will be your only indicator that the monitor has been turned on.  Do not shower for the first 24 hours. You may shower after the first 24 hours.  Press the button if you feel a symptom. You will hear a small click. Record Date, Time and  Symptom in the Patient Logbook.  When you are ready to remove the patch, follow instructions on the last 2 pages of Patient  Logbook. Stick patch monitor onto the last page of Patient Logbook.  Place Patient Logbook in the blue and white box. Use locking tab on box and tape box closed  securely. The blue and white box has prepaid postage on it. Please place it in the mailbox as  soon as possible. Your physician should have your test results approximately 7 days after the  monitor has been mailed back to [facility].  Call [place] at [phone] if you have questions regarding  your ZIO XT patch monitor. Call them immediately if you see an orange light blinking on your  monitor.  If your monitor  falls off in less than 4 days, contact our Monitor department at [phone].  If your monitor becomes loose or falls off after 4 days call [employer] at [phone] for  suggestions on securing your monitor     Signed, Sherren Mocha, MD  06/08/2021 1:36 PM    [facility]

## 2021-06-08 NOTE — Patient Instructions (Signed)
Medication Instructions:  1) DISCONTINUE Lisinopril.  Monitor your blood pressure 2-3 times per week.  Let us know if you are seeing the blood pressure consistently too high (top number above 140, bottom above 90).  *If you need a refill on your cardiac medications before your next appointment, please call your pharmacy*   Lab Work: None If you have labs (blood work) drawn today and your tests are completely normal, you will receive your results only by: [place] (if you have MyChart) OR A paper copy in the mail If you have any lab test that is abnormal or we need to change your treatment, we will call you to review the results.   Testing/Procedures: Your physician has requested that you have an echocardiogram. Echocardiography is a painless test that uses sound waves to create images of your heart. It provides your doctor with information about the size and shape of your heart and how well your heart's chambers and valves are working. This procedure takes approximately one hour. There are no restrictions for this procedure.  Your physician recommends that you wear a monitor for 2 weeks.   Follow-Up: At [facility], you and your health needs are our priority.  As part of our continuing mission to provide you with exceptional heart care, we have created designated Provider Care Teams.  These Care Teams include your primary Cardiologist (physician) and Advanced Practice Providers (APPs -  Physician Assistants and Nurse Practitioners) who all work together to provide you with the care you need, when you need it.  We recommend signing up for the patient portal called "MyChart".  Sign up information is provided on this After Visit Summary.  MyChart is used to connect with patients for Virtual Visits (Telemedicine).  Patients are able to view lab/test results, encounter notes, upcoming appointments, etc.  Non-urgent messages can be sent to your provider as well.   To learn more about what  you can do with MyChart, go to [email or web address].    Your next appointment:   6 month(s)  The format for your next appointment:   In Person  Provider:   Sherren Mocha, MD     Other Instructions  ZIO XT- Long Term Monitor Instructions  Your physician has requested you wear a ZIO patch monitor for 14 days.  This is a single patch monitor. [employer] supplies one patch monitor per enrollment. Additional stickers are not available. Please do not apply patch if you will be having a Nuclear Stress Test,  Echocardiogram, Cardiac CT, MRI, or Chest Xray during the period you would be wearing the  monitor. The patch cannot be worn during these tests. You cannot remove and re-apply the  ZIO XT patch monitor.  Your ZIO patch monitor will be mailed 3 day USPS to your address on file. It may take 3-5 days  to receive your monitor after you have been enrolled.  Once you have received your monitor, please review the enclosed instructions. Your monitor  has already been registered assigning a specific monitor serial # to you.  Billing and Patient Assistance Program Information  We have supplied [employer] with any of your insurance information on file for billing purposes. [employer] offers a sliding scale Patient Assistance Program for patients that do not have  insurance, or whose insurance does not completely cover the cost of the ZIO monitor.  You must apply for the Patient Assistance Program to qualify for this discounted rate.  To apply, please call [employer] at [phone], select option 4,  select option 2, ask to apply for  Patient Assistance Program. Theodore Demark will ask your household income, and how many people  are in your household. They will quote your out-of-pocket cost based on that information.  [employer] will also be able to set up a 72-month, interest-free payment plan if needed.  Applying the monitor   Shave hair from upper left chest.  Hold abrader disc by orange tab. Rub abrader  in 40 strokes over the upper left chest as  indicated in your monitor instructions.  Clean area with 4 enclosed alcohol pads. Let dry.  Apply patch as indicated in monitor instructions. Patch will be placed under collarbone on left  side of chest with arrow pointing upward.  Rub patch adhesive wings for 2 minutes. Remove white label marked "1". Remove the white  label marked "2". Rub patch adhesive wings for 2 additional minutes.  While looking in a mirror, press and release button in center of patch. A small green light will  flash 3-4 times. This will be your only indicator that the monitor has been turned on.  Do not shower for the first 24 hours. You may shower after the first 24 hours.  Press the button if you feel a symptom. You will hear a small click. Record Date, Time and  Symptom in the Patient Logbook.  When you are ready to remove the patch, follow instructions on the last 2 pages of Patient  Logbook. Stick patch monitor onto the last page of Patient Logbook.  Place Patient Logbook in the blue and white box. Use locking tab on box and tape box closed  securely. The blue and white box has prepaid postage on it. Please place it in the mailbox as  soon as possible. Your physician should have your test results approximately 7 days after the  monitor has been mailed back to [facility].  Call [place] at [phone] if you have questions regarding  your ZIO XT patch monitor. Call them immediately if you see an orange light blinking on your  monitor.  If your monitor falls off in less than 4 days, contact our Monitor department at [phone].  If your monitor becomes loose or falls off after 4 days call [employer] at [phone] for  suggestions on securing your monitor

## 2021-06-09 DIAGNOSIS — W1849XS Other slipping, tripping and stumbling without falling, sequela: Secondary | ICD-10-CM | POA: Diagnosis not present

## 2021-06-09 DIAGNOSIS — R2681 Unsteadiness on feet: Secondary | ICD-10-CM | POA: Diagnosis not present

## 2021-06-09 DIAGNOSIS — R2689 Other abnormalities of gait and mobility: Secondary | ICD-10-CM | POA: Diagnosis not present

## 2021-06-09 DIAGNOSIS — R278 Other lack of coordination: Secondary | ICD-10-CM | POA: Diagnosis not present

## 2021-06-09 DIAGNOSIS — R293 Abnormal posture: Secondary | ICD-10-CM | POA: Diagnosis not present

## 2021-06-09 DIAGNOSIS — Z9181 History of falling: Secondary | ICD-10-CM | POA: Diagnosis not present

## 2021-06-11 DIAGNOSIS — R55 Syncope and collapse: Secondary | ICD-10-CM

## 2021-06-20 ENCOUNTER — Other Ambulatory Visit: Payer: Self-pay | Admitting: Internal Medicine

## 2021-07-02 DIAGNOSIS — R55 Syncope and collapse: Secondary | ICD-10-CM | POA: Diagnosis not present

## 2021-07-06 ENCOUNTER — Telehealth: Payer: Self-pay

## 2021-07-06 NOTE — Telephone Encounter (Signed)
Called pt to review normal positive monitor results. Spoke with wife [name] (DPR on file) with patient in background. Patient pleased regarding monitor results, but had questions r/t recent high BP readings. Pt states he checks his BP on M/W/F each week and reports readings: M 06/29/21- 136/70 W 07/01/21- 152/62 F  07/03/21- 170/70 M 07/06/21 (today)- 170/70  Pt not currently on BP medications. States he checks his pressure while sitting. Condones mild swelling in bilateral ankles increased from baseline. Pt doesn't check weight daily or weekly, but denies feeling like he has gained weight. Denies SOB. Pt's wife states she just felt she should make Dr Burt Knack aware of BP readings being much higher than usual. This RN will send to [place] for advisement.  Judson Roch, RN

## 2021-07-07 ENCOUNTER — Other Ambulatory Visit: Payer: Self-pay

## 2021-07-07 ENCOUNTER — Ambulatory Visit (HOSPITAL_COMMUNITY): Payer: PPO | Attending: Cardiology

## 2021-07-07 ENCOUNTER — Encounter: Payer: Self-pay | Admitting: Cardiovascular Disease

## 2021-07-07 DIAGNOSIS — R55 Syncope and collapse: Secondary | ICD-10-CM | POA: Diagnosis not present

## 2021-07-07 LAB — ECHOCARDIOGRAM COMPLETE
Area-P 1/2: 2.13 cm2
P 1/2 time: 538 msec
S' Lateral: 3.4 cm

## 2021-07-07 MED ORDER — AMLODIPINE BESYLATE 2.5 MG PO TABS: 2.5000 mg | ORAL_TABLET | Freq: Every day | ORAL | 3 refills | Status: DC

## 2021-07-07 NOTE — Telephone Encounter (Signed)
Thanks [name] -lets try him on amlodipine 2.5 mg daily.  He has had postural lightheadedness so I want to be cautious with antihypertensive medicines in him.

## 2021-07-07 NOTE — Addendum Note (Signed)
Addended by: Thompson Grayer on: 07/07/2021 03:50 PM   Modules accepted: Orders

## 2021-07-07 NOTE — Telephone Encounter (Signed)
Patient's wife notified. Prescription sent to [facility].  Patient will have BP checked by nurse at [facility]

## 2021-08-05 DIAGNOSIS — L821 Other seborrheic keratosis: Secondary | ICD-10-CM | POA: Diagnosis not present

## 2021-08-05 DIAGNOSIS — Z85828 Personal history of other malignant neoplasm of skin: Secondary | ICD-10-CM | POA: Diagnosis not present

## 2021-08-05 DIAGNOSIS — L723 Sebaceous cyst: Secondary | ICD-10-CM | POA: Diagnosis not present

## 2021-08-05 DIAGNOSIS — L570 Actinic keratosis: Secondary | ICD-10-CM | POA: Diagnosis not present

## 2021-08-27 DIAGNOSIS — M25521 Pain in right elbow: Secondary | ICD-10-CM | POA: Diagnosis not present

## 2021-09-04 DIAGNOSIS — H903 Sensorineural hearing loss, bilateral: Secondary | ICD-10-CM | POA: Diagnosis not present

## 2021-09-10 ENCOUNTER — Encounter: Payer: Medicare Other | Admitting: Family

## 2021-09-21 DIAGNOSIS — M7701 Medial epicondylitis, right elbow: Secondary | ICD-10-CM | POA: Diagnosis not present

## 2021-10-01 ENCOUNTER — Telehealth: Payer: Self-pay

## 2021-10-01 MED ORDER — AMLODIPINE BESYLATE 5 MG PO TABS: 5.0000 mg | ORAL_TABLET | Freq: Every day | ORAL | 3 refills | Status: DC

## 2021-10-01 NOTE — Telephone Encounter (Signed)
Called and spoke to wife [name] regarding letter she wrote about [name]'s blood pressure. [name] and [name] both felt his pressure was running too high on Amlodipine 2.'5mg'$  daily dose. Provided readings from December 12 until March 22. Pressure log shows consistent readings that range from high 009'T systolic to 949 systolic and 62 diastolic to 80 diastolic. Dr Burt Knack reviewed readings and advised that patient increase to '5mg'$  daily and continue to monitor pressure. [name] and [name] both state that they understand and agree to plan. ?

## 2021-10-02 ENCOUNTER — Other Ambulatory Visit: Payer: Self-pay | Admitting: Internal Medicine

## 2021-10-14 ENCOUNTER — Encounter: Payer: Self-pay | Admitting: Internal Medicine

## 2021-10-14 NOTE — Patient Instructions (Addendum)
? ? ? ?  Blood work was ordered.   ? ? ?Medications changes include :   lexapro 5 mg daily ? ? ?Your prescription(s) have been sent to your pharmacy.  ? ? ? ?Return in about 6 months (around 04/16/2022) for follow up. ? ?

## 2021-10-14 NOTE — Progress Notes (Signed)
? ? ? ? ?Subjective:  ? ? Patient ID: XZAYVION VAETH, male    DOB: 10/22/1927, 88 y.o.   MRN: [id number] ? ?This visit occurred during the SARS-CoV-2 public health emergency.  Safety protocols were in place, including screening questions prior to the visit, additional usage of staff PPE, and extensive cleaning of exam room while observing appropriate contact time as indicated for disinfecting solutions.   ? ? ?HPI ?Fahed is here for follow up of his chronic medical problems, including dementia, htn, hld, DM, OSA, hypothyroid, CKD, insomnia.  He is here with is wife.  ? ?Balance is poor -denies falls. ? ?Leg swelling - 3-4 months.  Better by morning.  No shortness of breath.  He sits at the computer a lot during the day.  He does not always elevate his legs when watching TV. ? ? ?Balance ok per him.  His wife is concerned about his balance ? ?He tries to walk daily for an hour - does it about 5 days a week ? ? ?Eating well, sleeping well.   ? ?Medications and allergies reviewed with patient and updated if appropriate. ? ?Current Outpatient Medications on File Prior to Visit  ?Medication Sig Dispense Refill  ? amLODipine (NORVASC) 5 MG tablet Take 1 tablet (5 mg total) by mouth daily. 90 tablet 3  ? Cholecalciferol (VITAMIN D) 2000 UNITS CAPS Take 2 capsules by mouth daily.    ? ferrous sulfate 325 (65 FE) MG EC tablet Take 1 tablet (325 mg total) by mouth every other day. 15 tablet 3  ? levothyroxine (SYNTHROID) 25 MCG tablet Take one tablet by mouth once daily 30 minutes before breakfast on empty stomach. 90 tablet 1  ? polycarbophil (FIBERCON) 625 MG tablet Take 625 mg by mouth daily.    ? polyethylene glycol (MIRALAX / GLYCOLAX) 17 g packet Take 17 g by mouth daily. Take 1 to 2 capfuls daily at noon    ? rosuvastatin (CRESTOR) 40 MG tablet TAKE ONE TABLET BY MOUTH EVERY DAY 90 tablet 0  ? traZODone (DESYREL) 100 MG tablet TAKE ONE TABLET AT BEDTIME. 30 tablet 11  ? ?No current facility-administered medications on  file prior to visit.  ? ? ? ?Review of Systems  ?Constitutional:  Negative for fever.  ?Respiratory:  Negative for cough, shortness of breath and wheezing.   ?Cardiovascular:  Positive for leg swelling. Negative for chest pain and palpitations.  ?Neurological:  Positive for dizziness and light-headedness. Negative for headaches.  ? ?   ?Objective:  ? ?Vitals:  ? 10/15/21 1048  ?BP: 138/70  ?Pulse: 80  ?Temp: 97.9 ?F (36.6 ?C)  ?SpO2: 98%  ? ?BP Readings from Last 3 Encounters:  ?10/15/21 138/70  ?06/08/21 134/68  ?04/16/21 138/68  ? ?Wt Readings from Last 3 Encounters:  ?10/15/21 195 lb (88.5 kg)  ?06/08/21 191 lb 9.6 oz (86.9 kg)  ?04/16/21 190 lb (86.2 kg)  ? ?Body mass index is 27.2 kg/m?. ? ?  ?Physical Exam ?Constitutional:   ?   General: He is not in acute distress. ?   Appearance: Normal appearance. He is not ill-appearing.  ?HENT:  ?   Head: Normocephalic and atraumatic.  ?Eyes:  ?   Conjunctiva/sclera: Conjunctivae normal.  ?Cardiovascular:  ?   Rate and Rhythm: Normal rate and regular rhythm.  ?   Heart sounds: Normal heart sounds. No murmur heard. ?Pulmonary:  ?   Effort: Pulmonary effort is normal. No respiratory distress.  ?   Breath sounds: Normal  breath sounds. No wheezing or rales.  ?Musculoskeletal:  ?   Right lower leg: Edema (1+ pitting) present.  ?   Left lower leg: Edema (1+ pitting) present.  ?Skin: ?   General: Skin is warm and dry.  ?   Findings: No rash.  ?Neurological:  ?   Mental Status: He is alert. Mental status is at baseline.  ?Psychiatric:     ?   Mood and Affect: Mood normal.  ? ?   ? ?Lab Results  ?Component Value Date  ? WBC 13.3 (H) 04/06/2021  ? HGB 13.2 04/06/2021  ? HCT 39.0 04/06/2021  ? PLT 199 04/06/2021  ? GLUCOSE 140 (H) 03/30/2021  ? CHOL 116 07/17/2020  ? TRIG 104 07/17/2020  ? HDL 34 (A) 07/17/2020  ? [place] 62 07/17/2020  ? ALT 14 11/18/2020  ? AST 25 11/18/2020  ? NA 140 03/30/2021  ? K 4.7 03/30/2021  ? CL 109 03/30/2021  ? CREATININE 1.98 (H) 03/30/2021  ? BUN 42  (H) 03/30/2021  ? CO2 24 03/30/2021  ? TSH 5.72 11/18/2020  ? PSA 0.00 (L) 03/31/2016  ? HGBA1C 6.2 11/18/2020  ? MICROALBUR 5.2 (H) 03/28/2013  ? ? ? ?Assessment & Plan:  ? ? ?See Problem List for Assessment and Plan of chronic medical problems.  ? ? ?

## 2021-10-15 ENCOUNTER — Ambulatory Visit (INDEPENDENT_AMBULATORY_CARE_PROVIDER_SITE_OTHER): Payer: PPO | Admitting: Internal Medicine

## 2021-10-15 VITALS — BP 138/70 | HR 80 | Temp 97.9°F | Ht 71.0 in | Wt 195.0 lb

## 2021-10-15 DIAGNOSIS — G3184 Mild cognitive impairment, so stated: Secondary | ICD-10-CM

## 2021-10-15 DIAGNOSIS — E785 Hyperlipidemia, unspecified: Secondary | ICD-10-CM | POA: Diagnosis not present

## 2021-10-15 DIAGNOSIS — R600 Localized edema: Secondary | ICD-10-CM

## 2021-10-15 DIAGNOSIS — F419 Anxiety disorder, unspecified: Secondary | ICD-10-CM

## 2021-10-15 DIAGNOSIS — R2689 Other abnormalities of gait and mobility: Secondary | ICD-10-CM | POA: Diagnosis not present

## 2021-10-15 DIAGNOSIS — E1122 Type 2 diabetes mellitus with diabetic chronic kidney disease: Secondary | ICD-10-CM

## 2021-10-15 DIAGNOSIS — G4709 Other insomnia: Secondary | ICD-10-CM

## 2021-10-15 DIAGNOSIS — N1831 Chronic kidney disease, stage 3a: Secondary | ICD-10-CM

## 2021-10-15 DIAGNOSIS — E039 Hypothyroidism, unspecified: Secondary | ICD-10-CM

## 2021-10-15 DIAGNOSIS — I10 Essential (primary) hypertension: Secondary | ICD-10-CM

## 2021-10-15 LAB — COMPREHENSIVE METABOLIC PANEL
ALT: 16 U/L (ref 0–53)
AST: 21 U/L (ref 0–37)
Albumin: 4 g/dL (ref 3.5–5.2)
Alkaline Phosphatase: 60 U/L (ref 39–117)
BUN: 41 mg/dL — ABNORMAL HIGH (ref 6–23)
CO2: 28 mEq/L (ref 19–32)
Calcium: 9.1 mg/dL (ref 8.4–10.5)
Chloride: 106 mEq/L (ref 96–112)
Creatinine, Ser: 2.32 mg/dL — ABNORMAL HIGH (ref 0.40–1.50)
GFR: 23.87 mL/min — ABNORMAL LOW (ref >60.00)
Glucose, Bld: 133 mg/dL — ABNORMAL HIGH (ref 70–99)
Potassium: 4.5 mEq/L (ref 3.5–5.1)
Sodium: 141 mEq/L (ref 135–145)
Total Bilirubin: 0.5 mg/dL (ref 0.2–1.2)
Total Protein: 6.5 g/dL (ref 6.0–8.3)

## 2021-10-15 LAB — HEMOGLOBIN A1C: Hgb A1c MFr Bld: 6.4 % (ref 4.6–6.5)

## 2021-10-15 LAB — CBC WITH DIFFERENTIAL/PLATELET
Basophils Absolute: 0 10*3/uL (ref 0.0–0.1)
Basophils Relative: 0.2 % (ref 0.0–3.0)
Eosinophils Absolute: 0 10*3/uL (ref 0.0–0.7)
Eosinophils Relative: 0 % (ref 0.0–5.0)
HCT: 38.6 % — ABNORMAL LOW (ref 39.0–52.0)
Hemoglobin: 12.7 g/dL — ABNORMAL LOW (ref 13.0–17.0)
Lymphocytes Relative: 39.6 % (ref 12.0–46.0)
Lymphs Abs: 5.2 10*3/uL — ABNORMAL HIGH (ref 0.7–4.0)
MCHC: 32.9 g/dL (ref 30.0–36.0)
MCV: 96.5 fl (ref 78.0–100.0)
Monocytes Absolute: 1.4 10*3/uL — ABNORMAL HIGH (ref 0.1–1.0)
Monocytes Relative: 10.4 % (ref 3.0–12.0)
Neutro Abs: 6.6 10*3/uL (ref 1.4–7.7)
Neutrophils Relative %: 49.8 % (ref 43.0–77.0)
Platelets: 257 10*3/uL (ref 150.0–400.0)
RBC: 4 Mil/uL — ABNORMAL LOW (ref 4.22–5.81)
RDW: 15.4 % (ref 11.5–15.5)
WBC: 13.2 10*3/uL — ABNORMAL HIGH (ref 4.0–10.5)

## 2021-10-15 LAB — LIPID PANEL
Cholesterol: 126 mg/dL (ref 0–200)
HDL: 40 mg/dL (ref >39.00)
LDL Cholesterol: 70 mg/dL (ref 0–99)
NonHDL: 86.16
Total CHOL/HDL Ratio: 3
Triglycerides: 82 mg/dL (ref 0.0–149.0)
VLDL: 16.4 mg/dL (ref 0.0–40.0)

## 2021-10-15 LAB — VITAMIN B12: Vitamin B-12: 349 pg/mL (ref 211–911)

## 2021-10-15 LAB — TSH: TSH: 5.68 u[IU]/mL — ABNORMAL HIGH (ref 0.35–5.50)

## 2021-10-15 MED ORDER — ESCITALOPRAM OXALATE 5 MG PO TABS: 5.0000 mg | ORAL_TABLET | Freq: Every day | ORAL | 5 refills | Status: DC

## 2021-10-15 NOTE — Assessment & Plan Note (Signed)
Chronic ?Sugars well controlled with diet ?Check A1c, urine microalbumin today ?Continue regular exercise, diabetic diet ?

## 2021-10-15 NOTE — Assessment & Plan Note (Signed)
Chronic ?Blood pressure well controlled ?CMP ?Continue amlodipine 5 mg daily-he does have some leg swelling which preceded increasing the dose from 2.5 to 5 mg daily ?

## 2021-10-15 NOTE — Assessment & Plan Note (Signed)
Chronic ?Controlled, stable ?Continue trazodone 100 mg daily ? ?

## 2021-10-15 NOTE — Assessment & Plan Note (Signed)
Chronic ?Check lipid panel  ?Continue crestor 40 mg daily ?Regular exercise and healthy diet encouraged ? ?

## 2021-10-15 NOTE — Assessment & Plan Note (Signed)
Acute ?1 + b/l leg edema x 3-4 months  ?No SOB or signs of HF ?Likely venous insufficiencey ?Continue regular walking - will increase exercise  ?Elevate legs when sitting ?If sitting at computer will get up and walk around ?Consider compression socks ?No change in amlodipine at this time ?

## 2021-10-15 NOTE — Assessment & Plan Note (Signed)
Acute ?Gets upset easily over little things ?Will start lexapro 5 mg daily  ?

## 2021-10-15 NOTE — Assessment & Plan Note (Signed)
Chronic  ?Clinically euthyroid ?Currently taking levothyroxine 25 mcg daily ?Check tsh  ?Titrate med dose if needed ?

## 2021-10-15 NOTE — Assessment & Plan Note (Signed)
Chronic ?Early stable ?Check B12, TSH ?continue regular exercise ?

## 2021-10-15 NOTE — Assessment & Plan Note (Signed)
Chronic ?Walking regularly ?No falls ?Encouraged him to restart exercise classes at [facility] to help balance ?

## 2021-10-18 MED ORDER — LEVOTHYROXINE SODIUM 50 MCG PO TABS: 50.0000 ug | ORAL_TABLET | Freq: Every day | ORAL | 3 refills | Status: DC

## 2021-10-18 NOTE — Addendum Note (Signed)
Addended by: Binnie Rail on: 10/18/2021 01:38 PM ? ? Modules accepted: Orders ? ?

## 2021-11-02 ENCOUNTER — Telehealth: Payer: Self-pay | Admitting: Internal Medicine

## 2021-11-02 NOTE — Progress Notes (Signed)
?Cardiology Office Note:   ? ?Date:  11/04/2021  ? ?ID:  [name], DOB Dec 21, 1927, MRN [id number] ? ?PCP:  Binnie Rail, MD ?  ?[facility] Providers ?Cardiologist:  Sherren Mocha, MD    ? ?Referring MD: Binnie Rail, MD  ? ?Chief Complaint: bilateral leg edema ? ?History of Present Illness:   ? ?[name] is a very pleasant 88 y.o. male with a hx of hypertension, syncope, and hyperlipidemia ? ?Normal stress myoview with no chest pain, no ecg changes and no evidence of ischemia or infarction, EF 62% 03/2005. Has been followed by cardiology for many years for hypertension and syncope.  ? ?He was last seen by Dr. Burt Knack on 06/08/21 and reported he was experiencing lightheadedness and weakness when going from bed to standing at night to use the bathroom, no symptoms without position change. Was evaluated in the ED Oct 2022 with MRI of brain that did not demonstrate evidence of stroke. Was started on amlodipine 5 mg by Dr. Burt Knack for elevated BP as reported by patient's daughter via [place] on 07/07/21. ? ?Today, he is here for evaluation of leg swelling.  He has a friend at [facility] who is an MD and advised that he contact us. Patient reports bilateral leg swelling started 6-7 weeks ago, no SOB. Saw PCP, Dr. Quay Burow, 3 weeks ago and she advised leg elevation and compression. Admits he loves salty food and has been eating lots of soup. Thinks he has gained about 8 lbs recently (10 lbs on our scale from December 2022). He denies dyspnea, orthopnea, PND, chest pain, presyncope, syncope. Continues to be physically active at [employer].  ? ?Past Medical History:  ?Diagnosis Date  ? Anxiety   ? BRADYCARDIA 11/13/2008  ? Cataract   ? Cholelithiasis   ? COLONIC POLYPS, HX OF 02/04/2007  ? DEPRESSION 02/04/2007  ? DIABETES MELLITUS, TYPE II 02/04/2007  ? DIVERTICULOSIS, COLON 02/04/2007  ? FOREIGN BODY, ASPIRATION 12/04/2007  ? GERD (gastroesophageal reflux disease)   ? GLAUCOMA 03/08/2009  ? Hepatitis A 1963  ?  HYPERLIPIDEMIA 12/04/2007  ? HYPERTENSION 02/04/2007  ? Memory loss   ? OBSTRUCTIVE SLEEP APNEA 11/05/2009  ? -PSG 01/05/10 RDI 11, PLMI 56  ? OSTEOARTHRITIS 02/04/2007  ? Peptic stricture of esophagus   ? PROSTATE CANCER 1997  ? RENAL INSUFFICIENCY 11/13/2008  ? SYNCOPE 12/31/2009  ? THROMBOCYTOPENIA 04/17/2008  ? THYROID NODULE, RIGHT 11/13/2008  ? UNSPECIFIED ANEMIA 11/07/2007  ? ? ?Past Surgical History:  ?Procedure Laterality Date  ? Heart Cartherization  2000  ? PROSTATECTOMY  1997  ? TONSILLECTOMY  1940's  ? ? ?Current Medications: ?Current Meds  ?Medication Sig  ? amLODipine (NORVASC) 5 MG tablet Take 1 tablet (5 mg total) by mouth daily.  ? Cholecalciferol (VITAMIN D) 2000 UNITS CAPS Take 2 capsules by mouth daily.  ? escitalopram (LEXAPRO) 5 MG tablet Take 1 tablet (5 mg total) by mouth daily.  ? ferrous sulfate 325 (65 FE) MG EC tablet Take 1 tablet (325 mg total) by mouth every other day.  ? levothyroxine (SYNTHROID) 50 MCG tablet Take 1 tablet (50 mcg total) by mouth daily.  ? polycarbophil (FIBERCON) 625 MG tablet Take 625 mg by mouth daily.  ? polyethylene glycol (MIRALAX / GLYCOLAX) 17 g packet Take 17 g by mouth daily. Take 1 to 2 capfuls daily at noon  ? rosuvastatin (CRESTOR) 40 MG tablet TAKE ONE TABLET BY MOUTH EVERY DAY  ? traZODone (DESYREL) 100 MG tablet TAKE ONE  TABLET AT BEDTIME.  ?  ? ?Allergies:   Lactose intolerance (gi), Sulfa antibiotics, and Sulfonamide derivatives  ? ?Social History  ? ?Socioeconomic History  ? Marital status: Married  ?  Spouse name: [name]  ? Number of children: 3  ? Years of education: college  ? Highest education level: Bachelor's degree (e.g., BA, AB, BS)  ?Occupational History  ? 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  Comment: Retired  ?Tobacco Use  ? Smoking status: Former  ?  Types: Cigarettes, Pipe  ?  Quit date: 07/08/1993  ?  Years since quitting: 28.3  ? Smokeless tobacco: Never  ?Vaping Use  ? Vaping Use: Never used  ?Substance and Sexual Activity   ? Alcohol use: Yes  ?  Comment: minimal  ? Drug use: No  ? Sexual activity: Not on file  ?Other Topics Concern  ? Not on file  ?Social History Narrative  ? He played tennis 3x a week before moving to [place].  ? Right-handed.  ? 2 cups caffeine per day.  ?   ? ?Social Determinants of Health  ? ?Financial Resource Strain: Not on file  ?Food Insecurity: Not on file  ?Transportation Needs: Not on file  ?Physical Activity: Not on file  ?Stress: Not on file  ?Social Connections: Not on file  ?  ? ?Family History: ?The patient's family history includes Kidney disease in his father; Other in his mother. There is no history of Heart disease, Cancer, Colon cancer, Esophageal cancer, Inflammatory bowel disease, Liver disease, Pancreatic cancer, Rectal cancer, or Stomach cancer. ? ?ROS:   ?Please see the history of present illness.    ?+ bilateral leg edema ?All other systems reviewed and are negative. ? ?Labs/Other Studies Reviewed:   ? ?The following studies were reviewed today: ? ?Echo 07/07/21 ? ?1. Left ventricular ejection fraction, by estimation, is 60 to 65%. The  ?left ventricle has normal function. The left ventricle has no regional  ?wall motion abnormalities. Left ventricular diastolic parameters are  ?consistent with Grade I diastolic  ?dysfunction (impaired relaxation).  ? 2. Right ventricular systolic function is normal. The right ventricular  ?size is normal.  ? 3. Left atrial size was mildly dilated.  ? 4. The mitral valve is normal in structure. Mild mitral valve  ?regurgitation. No evidence of mitral stenosis.  ? 5. The aortic valve is tricuspid. There is mild calcification of the  ?aortic valve. There is mild thickening of the aortic valve. Aortic valve  ?regurgitation is mild. Aortic valve sclerosis/calcification is present,  ?without any evidence of aortic  ?stenosis.  ? 6. Pulmonic valve regurgitation is moderate.  ? 7. The inferior vena cava is normal in size with greater than 50%  ?respiratory  variability, suggesting right atrial pressure of 3 mmHg.  ? ? ?Cardiac monitor 07/05/21 ? ?The basic rhythm is normal sinus with an average HR of 57 bpm ?No atrial fibrillation or flutter ?No high-grade heart block or pathologic pauses ?There are no PVC's and no supraventricular beats with several short supraventricular runs, the longest lasting 16 seconds at a rate of 99 bpm.  ?  ?Myocardial Perfusion Study 03/2005 ? ?Normal stress myoview with no chest pain, no evidence of ischemia or infarction ?LVEF 62% and normal wall motion ? ?Recent Labs: ?10/15/2021: ALT 16; BUN 41; Creatinine, Ser 2.32; Hemoglobin 12.7; Platelets 257.0; Potassium 4.5; Sodium 141; TSH 5.68  ?Recent Lipid Panel ?   ?Component Value Date/Time  ? CHOL 126 10/15/2021 1142  ? TRIG 82.0  10/15/2021 1142  ? TRIG 48 04/05/2006 0750  ? HDL 40.00 10/15/2021 1142  ? CHOLHDL 3 10/15/2021 1142  ? VLDL 16.4 10/15/2021 1142  ? [place] 70 10/15/2021 1142  ? ? ? ?Risk Assessment/Calculations:   ?  ? ? ?Physical Exam:   ? ?VS:  BP (!) 160/66 (BP Location: Left Arm, Patient Position: Sitting, Cuff Size: Normal)   Pulse (!) 58   Ht '5\' 11"'$  (1.803 m)   Wt 201 lb 9.6 oz (91.4 kg)   SpO2 96%   BMI 28.12 kg/m?    ? ?Wt Readings from Last 3 Encounters:  ?11/04/21 201 lb 9.6 oz (91.4 kg)  ?10/15/21 195 lb (88.5 kg)  ?06/08/21 191 lb 9.6 oz (86.9 kg)  ?  ? ?GEN:  Well nourished, well developed in no acute distress ?HEENT: Normal ?NECK: No JVD; No carotid bruits ?CARDIAC: RRR, no murmurs, rubs, gallops ?RESPIRATORY:  Clear to auscultation without rales, wheezing or rhonchi  ?ABDOMEN: Soft, non-tender, non-distended ?MUSCULOSKELETAL:  2+ pitting edema bilateral LE; No deformity. 2+ pedal pulses, equal bilaterally ?SKIN: Warm and dry ?NEUROLOGIC:  Alert and oriented x 3 ?PSYCHIATRIC:  Normal affect  ? ?EKG:  EKG is not ordered today.   ? ?Diagnoses:   ? ?1. Essential hypertension   ?2. Stage 3b chronic kidney disease ([place])   ?3. Syncope and collapse   ?4. Bilateral leg  edema   ? ?Assessment and Plan:   ? ? ?Bilateral leg swelling: Onset 6-7 weeks ago. Sits at computer several hours daily and eating high sodium diet. Encouraged leg elevation and provided visual for appropriate leg heigh

## 2021-11-02 NOTE — Telephone Encounter (Signed)
LVM for pt to rtn my call to schedule AWV with [facility]. Please schedule AWV if pt calls the office  

## 2021-11-04 ENCOUNTER — Ambulatory Visit (INDEPENDENT_AMBULATORY_CARE_PROVIDER_SITE_OTHER): Payer: PPO | Admitting: Nurse Practitioner

## 2021-11-04 ENCOUNTER — Encounter: Payer: Self-pay | Admitting: Nurse Practitioner

## 2021-11-04 VITALS — BP 160/66 | HR 58 | Ht 71.0 in | Wt 201.6 lb

## 2021-11-04 DIAGNOSIS — R55 Syncope and collapse: Secondary | ICD-10-CM | POA: Diagnosis not present

## 2021-11-04 DIAGNOSIS — N1832 Chronic kidney disease, stage 3b: Secondary | ICD-10-CM

## 2021-11-04 DIAGNOSIS — I10 Essential (primary) hypertension: Secondary | ICD-10-CM

## 2021-11-04 DIAGNOSIS — R600 Localized edema: Secondary | ICD-10-CM | POA: Diagnosis not present

## 2021-11-04 NOTE — Patient Instructions (Signed)
Medication Instructions:  ? ?Your physician recommends that you continue on your current medications as directed. Please refer to the Current Medication list given to you today. ? ? ?*If you need a refill on your cardiac medications before your next appointment, please call your pharmacy* ? ? ?Lab Work: ? ?None Ordered. ? ?If you have labs (blood work) drawn today and your tests are completely normal, you will receive your results only by: ?MyChart Message (if you have MyChart) OR ?A paper copy in the mail ?If you have any lab test that is abnormal or we need to change your treatment, we will call you to review the results. ? ? ?Testing/Procedures: ? ?None ordered. ? ? ?Follow-Up: ?At [facility], you and your health needs are our priority.  As part of our continuing mission to provide you with exceptional heart care, we have created designated Provider Care Teams.  These Care Teams include your primary Cardiologist (physician) and Advanced Practice Providers (APPs -  Physician Assistants and Nurse Practitioners) who all work together to provide you with the care you need, when you need it. ? ?We recommend signing up for the patient portal called "MyChart".  Sign up information is provided on this After Visit Summary.  MyChart is used to connect with patients for Virtual Visits (Telemedicine).  Patients are able to view lab/test results, encounter notes, upcoming appointments, etc.  Non-urgent messages can be sent to your provider as well.   ?To learn more about what you can do with MyChart, go to [email or web address].   ? ?Your next appointment:   ?1 month(s) ? ?The format for your next appointment:   ?In Person ? ?Provider:   ?Sherren Mocha, MD   ? ? ?Other Instructions ? ? ?Important Information About Sugar ? ? ? ? ?  ? ?For your  leg edema you  should do  the following ?1. Leg elevation - I recommend the [address]. Leg rest.  See below for details  ?2. Salt restriction  -  Use potassium chloride instead  of regular salt as a salt substitute. ?3. Walk regularly ?4. Compression hose - Medical Supply store  ?5. Weight loss  ? ? ?Available on [email or web address] ?Or  ?Go to [email or web address] ? ? ? ? ?

## 2021-11-09 ENCOUNTER — Ambulatory Visit (INDEPENDENT_AMBULATORY_CARE_PROVIDER_SITE_OTHER): Payer: PPO

## 2021-11-09 DIAGNOSIS — Z0000 Encounter for general adult medical examination without abnormal findings: Secondary | ICD-10-CM | POA: Diagnosis not present

## 2021-11-09 NOTE — Progress Notes (Signed)
?I connected with [name] today by telephone and verified that I am speaking with the correct person using two identifiers. ?Location patient: home ?Location provider: work ?Persons participating in the virtual visit: patient, provider. ?  ?I discussed the limitations, risks, security and privacy concerns of performing an evaluation and management service by telephone and the availability of in person appointments. I also discussed with the patient that there may be a patient responsible charge related to this service. The patient expressed understanding and verbally consented to this telephonic visit.  ?  ?Interactive audio and video telecommunications were attempted between this provider and patient, however failed, due to patient having technical difficulties OR patient did not have access to video capability.  We continued and completed visit with audio only. ? ?Some vital signs may be absent or patient reported.  ? ?Time Spent with patient on telephone encounter: 30 minutes ? ?Subjective:  ? [name] is a 88 y.o. male who presents for Medicare Annual/Subsequent preventive examination. ? ?Review of Systems    ? ?Cardiac Risk Factors include: advanced age (>77mn, >>49women);dyslipidemia;family history of premature cardiovascular disease;hypertension;male gender ? ?   ?Objective:  ?  ?There were no vitals filed for this visit. ?There is no height or weight on file to calculate BMI. ? ? ?  11/09/2021  ?  9:44 AM 04/06/2021  ? 10:11 AM 03/30/2021  ? 10:50 AM 09/09/2020  ?  1:12 PM 07/23/2020  ? 11:32 AM 06/04/2020  ? 10:31 AM 03/19/2020  ? 10:57 AM  ?Advanced Directives  ?Does Patient Have a Medical Advance Directive? Yes Yes Yes Yes Yes Yes Yes  ?Type of [place] Living will  [place] Out of facility DNR (pink MOST or yellow form) Out of facility DNR (pink MOST or yellow form) Out of facility DNR (pink MOST or yellow  form);Living will  ?Does patient want to make changes to medical advance directive?  No - Patient declined  No - Patient declined No - Patient declined No - Patient declined No - Patient declined  ?Copy of [place]? Yes - validated most recent copy scanned in chart (See row information)   Yes - validated most recent copy scanned in chart (See row information)     ?Pre-existing out of facility DNR order (yellow form or pink MOST form)     Pink MOST/Yellow Form most recent copy in chart - Physician notified to receive inpatient order Pink MOST/Yellow Form most recent copy in chart - Physician notified to receive inpatient order Pink MOST/Yellow Form most recent copy in chart - Physician notified to receive inpatient order  ? ? ?Current Medications (verified) ?Outpatient Encounter Medications as of 11/09/2021  ?Medication Sig  ? amLODipine (NORVASC) 5 MG tablet Take 1 tablet (5 mg total) by mouth daily.  ? Cholecalciferol (VITAMIN D) 2000 UNITS CAPS Take 2 capsules by mouth daily.  ? escitalopram (LEXAPRO) 5 MG tablet Take 1 tablet (5 mg total) by mouth daily.  ? ferrous sulfate 325 (65 FE) MG EC tablet Take 1 tablet (325 mg total) by mouth every other day.  ? levothyroxine (SYNTHROID) 50 MCG tablet Take 1 tablet (50 mcg total) by mouth daily.  ? polycarbophil (FIBERCON) 625 MG tablet Take 625 mg by mouth daily.  ? polyethylene glycol (MIRALAX / GLYCOLAX) 17 g packet Take 17 g by mouth daily. Take 1 to 2 capfuls daily at noon  ? rosuvastatin (CRESTOR)  40 MG tablet TAKE ONE TABLET BY MOUTH EVERY DAY  ? traZODone (DESYREL) 100 MG tablet TAKE ONE TABLET AT BEDTIME.  ? ?No facility-administered encounter medications on file as of 11/09/2021.  ? ? ?Allergies (verified) ?Sulfa antibiotics and Sulfonamide derivatives  ? ?History: ?Past Medical History:  ?Diagnosis Date  ? Anxiety   ? BRADYCARDIA 11/13/2008  ? Cataract   ? Cholelithiasis   ? COLONIC POLYPS, HX OF 02/04/2007  ? DEPRESSION 02/04/2007  ?  DIABETES MELLITUS, TYPE II 02/04/2007  ? DIVERTICULOSIS, COLON 02/04/2007  ? FOREIGN BODY, ASPIRATION 12/04/2007  ? GERD (gastroesophageal reflux disease)   ? GLAUCOMA 03/08/2009  ? Hepatitis A 1963  ? HYPERLIPIDEMIA 12/04/2007  ? HYPERTENSION 02/04/2007  ? Memory loss   ? OBSTRUCTIVE SLEEP APNEA 11/05/2009  ? -PSG 01/05/10 RDI 11, PLMI 56  ? OSTEOARTHRITIS 02/04/2007  ? Peptic stricture of esophagus   ? PROSTATE CANCER 1997  ? RENAL INSUFFICIENCY 11/13/2008  ? SYNCOPE 12/31/2009  ? THROMBOCYTOPENIA 04/17/2008  ? THYROID NODULE, RIGHT 11/13/2008  ? UNSPECIFIED ANEMIA 11/07/2007  ? ?Past Surgical History:  ?Procedure Laterality Date  ? Heart Cartherization  2000  ? PROSTATECTOMY  1997  ? TONSILLECTOMY  1940's  ? ?Family History  ?Problem Relation Age of Onset  ? Kidney disease Father   ?     Bright's Disease - died at age 17  ? Other Mother   ?     no signifincant health problems - died at age 67  ? Heart disease Neg Hx   ?     No FH of Coronary Artery Disease  ? Cancer Neg Hx   ? Colon cancer Neg Hx   ? Esophageal cancer Neg Hx   ? Inflammatory bowel disease Neg Hx   ? Liver disease Neg Hx   ? Pancreatic cancer Neg Hx   ? Rectal cancer Neg Hx   ? Stomach cancer Neg Hx   ? ?Social History  ? ?Socioeconomic History  ? Marital status: Married  ?  Spouse name: [name]  ? Number of children: 3  ? Years of education: college  ? Highest education level: Bachelor's degree (e.g., BA, AB, BS)  ?Occupational History  ? 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  Comment: Retired  ?Tobacco Use  ? Smoking status: Former  ?  Types: Cigarettes, Pipe  ?  Quit date: 07/08/1993  ?  Years since quitting: 28.3  ? Smokeless tobacco: Never  ?Vaping Use  ? Vaping Use: Never used  ?Substance and Sexual Activity  ? Alcohol use: Yes  ?  Comment: minimal  ? Drug use: No  ? Sexual activity: Not on file  ?Other Topics Concern  ? Not on file  ?Social History Narrative  ? He played tennis 3x a week before moving to [place].  ? Right-handed.  ? 2 cups  caffeine per day.  ?   ? ?Social Determinants of Health  ? ?Financial Resource Strain: Low Risk   ? Difficulty of Paying Living Expenses: Not hard at all  ?Food Insecurity: No Food Insecurity  ? Worried About Charity fundraiser in the Last Year: Never true  ? Ran Out of Food in the Last Year: Never true  ?Transportation Needs: No Transportation Needs  ? Lack of Transportation (Medical): No  ? Lack of Transportation (Non-Medical): No  ?Physical Activity: Sufficiently Active  ? Days of Exercise per Week: 6 days  ? Minutes of Exercise per Session: 30 min  ?Stress: No Stress Concern Present  ?  Feeling of Stress : Not at all  ?Social Connections: Socially Integrated  ? Frequency of Communication with Friends and Family: More than three times a week  ? Frequency of Social Gatherings with Friends and Family: More than three times a week  ? Attends Religious Services: More than 4 times per year  ? Active Member of Clubs or Organizations: Yes  ? Attends Archivist Meetings: More than 4 times per year  ? Marital Status: Married  ? ? ?Tobacco Counseling ?Counseling given: Not Answered ? ? ?Clinical Intake: ? ?Pre-visit preparation completed: Yes ? ?Pain : No/denies pain ? ?  ? ?Nutritional Risks: None ?Diabetes: No ? ?How often do you need to have someone help you when you read instructions, pamphlets, or other written materials from your doctor or pharmacy?: 1 - Never ?What is the last grade level you completed in school?: Bachelor's Degree ? ?Diabetic? no ? ?Interpreter Needed?: No ? ?Information entered by :: Lisette Abu, LPN ? ? ?Activities of Daily Living ? ?  11/09/2021  ?  9:46 AM  ?In your present state of health, do you have any difficulty performing the following activities:  ?Hearing? 1  ?Vision? 0  ?Difficulty concentrating or making decisions? 0  ?Walking or climbing stairs? 0  ?Dressing or bathing? 0  ?Doing errands, shopping? 0  ?Preparing Food and eating ? N  ?Using the Toilet? N  ?In the past  six months, have you accidently leaked urine? N  ?Do you have problems with loss of bowel control? N  ?Managing your Medications? N  ?Managing your Finances? N  ?Housekeeping or managing your Housekeeping? N  ? ?

## 2021-11-09 NOTE — Patient Instructions (Signed)
[name] , ?Thank you for taking time to come for your Medicare Wellness Visit. I appreciate your ongoing commitment to your health goals. Please review the following plan we discussed and let me know if I can assist you in the future.  ? ?Screening recommendations/referrals: ?Colonoscopy: Discontinued due to age ?Recommended yearly ophthalmology/optometry visit for glaucoma screening and checkup ?Recommended yearly dental visit for hygiene and checkup ? ?Vaccinations: ?Influenza vaccine: 04/09/2021 ?Pneumococcal vaccine: 06/29/1995, 03/14/2015 ?Tdap vaccine: 12/02/2016; due every 10 years ?Shingles vaccine: 12/30/2017, 05/22/2018   ?Covid-19: 08/10/2019, 09/06/2019, 05/13/2020 ? ?Advanced directives: Yes; documents on file. ? ?Conditions/risks identified: Yes ? ?Next appointment: Please schedule your next Medicare Wellness Visit with your Nurse Health Advisor in 1 year by calling [phone]. ? ?Preventive Care 47 Years and Older, Male ?Preventive care refers to lifestyle choices and visits with your health care provider that can promote health and wellness. ?What does preventive care include? ?A yearly physical exam. This is also called an annual well check. ?Dental exams once or twice a year. ?Routine eye exams. Ask your health care provider how often you should have your eyes checked. ?Personal lifestyle choices, including: ?Daily care of your teeth and gums. ?Regular physical activity. ?Eating a healthy diet. ?Avoiding tobacco and drug use. ?Limiting alcohol use. ?Practicing safe sex. ?Taking low doses of aspirin every day. ?Taking vitamin and mineral supplements as recommended by your health care provider. ?What happens during an annual well check? ?The services and screenings done by your health care provider during your annual well check will depend on your age, overall health, lifestyle risk factors, and family history of disease. ?Counseling  ?Your health care provider may ask you questions about  your: ?Alcohol use. ?Tobacco use. ?Drug use. ?Emotional well-being. ?Home and relationship well-being. ?Sexual activity. ?Eating habits. ?History of falls. ?Memory and ability to understand (cognition). ?Work and work Statistician. ?Screening  ?You may have the following tests or measurements: ?Height, weight, and BMI. ?Blood pressure. ?Lipid and cholesterol levels. These may be checked every 5 years, or more frequently if you are over 80 years old. ?Skin check. ?Lung cancer screening. You may have this screening every year starting at age 82 if you have a 30-pack-year history of smoking and currently smoke or have quit within the past 15 years. ?Fecal occult blood test (FOBT) of the stool. You may have this test every year starting at age 12. ?Flexible sigmoidoscopy or colonoscopy. You may have a sigmoidoscopy every 5 years or a colonoscopy every 10 years starting at age 15. ?Prostate cancer screening. Recommendations will vary depending on your family history and other risks. ?Hepatitis C blood test. ?Hepatitis B blood test. ?Sexually transmitted disease (STD) testing. ?Diabetes screening. This is done by checking your blood sugar (glucose) after you have not eaten for a while (fasting). You may have this done every 1-3 years. ?Abdominal aortic aneurysm (AAA) screening. You may need this if you are a current or former smoker. ?Osteoporosis. You may be screened starting at age 62 if you are at high risk. ?Talk with your health care provider about your test results, treatment options, and if necessary, the need for more tests. ?Vaccines  ?Your health care provider may recommend certain vaccines, such as: ?Influenza vaccine. This is recommended every year. ?Tetanus, diphtheria, and acellular pertussis (Tdap, Td) vaccine. You may need a Td booster every 10 years. ?Zoster vaccine. You may need this after age 30. ?Pneumococcal 13-valent conjugate (PCV13) vaccine. One dose is recommended after age 64. ?Pneumococcal  polysaccharide (PPSV23) vaccine. One dose is recommended after age 94. ?Talk to your health care provider about which screenings and vaccines you need and how often you need them. ?This information is not intended to replace advice given to you by your health care provider. Make sure you discuss any questions you have with your health care provider. ?Document Released: 07/11/2015 Document Revised: 03/03/2016 Document Reviewed: 04/15/2015 ?Elsevier Interactive Patient Education ? 2017 [place]. ? ?Fall Prevention in the Home ?Falls can cause injuries. They can happen to people of all ages. There are many things you can do to make your home safe and to help prevent falls. ?What can I do on the outside of my home? ?Regularly fix the edges of walkways and driveways and fix any cracks. ?Remove anything that might make you trip as you walk through a door, such as a raised step or threshold. ?Trim any bushes or trees on the path to your home. ?Use bright outdoor lighting. ?Clear any walking paths of anything that might make someone trip, such as rocks or tools. ?Regularly check to see if handrails are loose or broken. Make sure that both sides of any steps have handrails. ?Any raised decks and porches should have guardrails on the edges. ?Have any leaves, snow, or ice cleared regularly. ?Use sand or salt on walking paths during winter. ?Clean up any spills in your garage right away. This includes oil or grease spills. ?What can I do in the bathroom? ?Use night lights. ?Install grab bars by the toilet and in the tub and shower. Do not use towel bars as grab bars. ?Use non-skid mats or decals in the tub or shower. ?If you need to sit down in the shower, use a plastic, non-slip stool. ?Keep the floor dry. Clean up any water that spills on the floor as soon as it happens. ?Remove soap buildup in the tub or shower regularly. ?Attach bath mats securely with double-sided non-slip rug tape. ?Do not have throw rugs and other  things on the floor that can make you trip. ?What can I do in the bedroom? ?Use night lights. ?Make sure that you have a light by your bed that is easy to reach. ?Do not use any sheets or blankets that are too big for your bed. They should not hang down onto the floor. ?Have a firm chair that has side arms. You can use this for support while you get dressed. ?Do not have throw rugs and other things on the floor that can make you trip. ?What can I do in the kitchen? ?Clean up any spills right away. ?Avoid walking on wet floors. ?Keep items that you use a lot in easy-to-reach places. ?If you need to reach something above you, use a strong step stool that has a grab bar. ?Keep electrical cords out of the way. ?Do not use floor polish or wax that makes floors slippery. If you must use wax, use non-skid floor wax. ?Do not have throw rugs and other things on the floor that can make you trip. ?What can I do with my stairs? ?Do not leave any items on the stairs. ?Make sure that there are handrails on both sides of the stairs and use them. Fix handrails that are broken or loose. Make sure that handrails are as long as the stairways. ?Check any carpeting to make sure that it is firmly attached to the stairs. Fix any carpet that is loose or worn. ?Avoid having throw rugs at the top or  bottom of the stairs. If you do have throw rugs, attach them to the floor with carpet tape. ?Make sure that you have a light switch at the top of the stairs and the bottom of the stairs. If you do not have them, ask someone to add them for you. ?What else can I do to help prevent falls? ?Wear shoes that: ?Do not have high heels. ?Have rubber bottoms. ?Are comfortable and fit you well. ?Are closed at the toe. Do not wear sandals. ?If you use a stepladder: ?Make sure that it is fully opened. Do not climb a closed stepladder. ?Make sure that both sides of the stepladder are locked into place. ?Ask someone to hold it for you, if possible. ?Clearly  mark and make sure that you can see: ?Any grab bars or handrails. ?First and last steps. ?Where the edge of each step is. ?Use tools that help you move around (mobility aids) if they are needed. These include: ?Canes. ?Walk

## 2021-12-15 ENCOUNTER — Encounter: Payer: Self-pay | Admitting: Cardiovascular Disease

## 2021-12-15 ENCOUNTER — Ambulatory Visit: Payer: Medicare Other | Admitting: Cardiovascular Disease

## 2021-12-15 ENCOUNTER — Ambulatory Visit (INDEPENDENT_AMBULATORY_CARE_PROVIDER_SITE_OTHER): Payer: PPO | Admitting: Cardiovascular Disease

## 2021-12-15 VITALS — BP 130/70 | HR 56 | Ht 71.0 in | Wt 197.6 lb

## 2021-12-15 DIAGNOSIS — N1832 Chronic kidney disease, stage 3b: Secondary | ICD-10-CM

## 2021-12-15 DIAGNOSIS — B351 Tinea unguium: Secondary | ICD-10-CM | POA: Diagnosis not present

## 2021-12-15 DIAGNOSIS — L84 Corns and callosities: Secondary | ICD-10-CM | POA: Diagnosis not present

## 2021-12-15 DIAGNOSIS — I10 Essential (primary) hypertension: Secondary | ICD-10-CM | POA: Diagnosis not present

## 2021-12-15 DIAGNOSIS — E1159 Type 2 diabetes mellitus with other circulatory complications: Secondary | ICD-10-CM | POA: Diagnosis not present

## 2021-12-15 DIAGNOSIS — R600 Localized edema: Secondary | ICD-10-CM | POA: Diagnosis not present

## 2021-12-15 DIAGNOSIS — Q6689 Other  specified congenital deformities of feet: Secondary | ICD-10-CM | POA: Diagnosis not present

## 2021-12-15 MED ORDER — FUROSEMIDE 40 MG PO TABS
ORAL_TABLET | ORAL | 3 refills | Status: DC
Start: 2021-12-15 — End: 2022-03-17

## 2021-12-15 NOTE — Patient Instructions (Signed)
Medication Instructions:  Furosemide '40mg'$  once weekly/as needed *If you need a refill on your cardiac medications before your next appointment, please call your pharmacy*   Lab Work: NONE If you have labs (blood work) drawn today and your tests are completely normal, you will receive your results only by: [place] (if you have MyChart) OR A paper copy in the mail If you have any lab test that is abnormal or we need to change your treatment, we will call you to review the results.   Testing/Procedures: NONE   Follow-Up: At [facility], you and your health needs are our priority.  As part of our continuing mission to provide you with exceptional heart care, we have created designated Provider Care Teams.  These Care Teams include your primary Cardiologist (physician) and Advanced Practice Providers (APPs -  Physician Assistants and Nurse Practitioners) who all work together to provide you with the care you need, when you need it.  We recommend signing up for the patient portal called "MyChart".  Sign up information is provided on this After Visit Summary.  MyChart is used to connect with patients for Virtual Visits (Telemedicine).  Patients are able to view lab/test results, encounter notes, upcoming appointments, etc.  Non-urgent messages can be sent to your provider as well.   To learn more about what you can do with MyChart, go to [email or web address].    Your next appointment:   3 month(s)  The format for your next appointment:   In Person  Provider:   Christen Bame, then 6 months with Dr Burt Knack {     Important Information About Sugar

## 2021-12-15 NOTE — Progress Notes (Signed)
Cardiology Office Note:    Date:  12/17/2021   ID:  [name], DOB Sep 07, 1927, MRN [id number]  PCP:  Pincus Sanes, MD   [facility] Providers Cardiologist:  Tonny Bollman, MD     Referring MD: Pincus Sanes, MD   Chief Complaint  Patient presents with   Hypertension    History of Present Illness:    [name] is a 88 y.o. male with a hx of hypertension, remote syncope, and mixed hyperlipidemia.  He has recently had difficulty with lower extremity edema.  Notably, he was started on amlodipine in January for hypertension.  He was seen in May and noted to have been eating a lot of salty food.  He followed instructions for reducing sodium intake, elevating his legs, and wearing compression socks.  His edema has improved.  He denies chest pain or pressure, palpitations, or shortness of breath.  The patient is here with his wife today.  He remains pretty active for his advanced age.  He denies orthopnea or PND.  Past Medical History:  Diagnosis Date   Anxiety    BRADYCARDIA 11/13/2008   Cataract    Cholelithiasis    COLONIC POLYPS, HX OF 02/04/2007   DEPRESSION 02/04/2007   DIABETES MELLITUS, TYPE II 02/04/2007   DIVERTICULOSIS, COLON 02/04/2007   FOREIGN BODY, ASPIRATION 12/04/2007   GERD (gastroesophageal reflux disease)    GLAUCOMA 03/08/2009   Hepatitis A 1963   HYPERLIPIDEMIA 12/04/2007   HYPERTENSION 02/04/2007   Memory loss    OBSTRUCTIVE SLEEP APNEA 11/05/2009   -PSG 01/05/10 RDI 11, PLMI 56   OSTEOARTHRITIS 02/04/2007   Peptic stricture of esophagus    PROSTATE CANCER 1997   RENAL INSUFFICIENCY 11/13/2008   SYNCOPE 12/31/2009   THROMBOCYTOPENIA 04/17/2008   THYROID NODULE, RIGHT 11/13/2008   UNSPECIFIED ANEMIA 11/07/2007    Past Surgical History:  Procedure Laterality Date   Heart Cartherization  2000   PROSTATECTOMY  1997   TONSILLECTOMY  1940's    Current Medications: Current Meds  Medication Sig   amLODipine (NORVASC) 5 MG tablet Take 1 tablet (5 mg total)  by mouth daily.   Cholecalciferol (VITAMIN D) 2000 UNITS CAPS Take 2 capsules by mouth daily.   escitalopram (LEXAPRO) 5 MG tablet Take 1 tablet (5 mg total) by mouth daily.   ferrous sulfate 325 (65 FE) MG EC tablet Take 1 tablet (325 mg total) by mouth every other day.   furosemide (LASIX) 40 MG tablet Take 1 tablet by mouth as needed for fluid retention (once weekly)   levothyroxine (SYNTHROID) 50 MCG tablet Take 1 tablet (50 mcg total) by mouth daily.   polycarbophil (FIBERCON) 625 MG tablet Take 625 mg by mouth daily.   polyethylene glycol (MIRALAX / GLYCOLAX) 17 g packet Take 17 g by mouth daily. Take 1 to 2 capfuls daily at noon   rosuvastatin (CRESTOR) 40 MG tablet TAKE ONE TABLET BY MOUTH EVERY DAY   traZODone (DESYREL) 100 MG tablet TAKE ONE TABLET AT BEDTIME.     Allergies:   Sulfa antibiotics and Sulfonamide derivatives   Social History   Socioeconomic History   Marital status: Married    Spouse name: Randa Evens   Number of children: 3   Years of education: college   Highest education level: Bachelor's degree (e.g., BA, AB, BS)  Occupational History   Occupation: Database administrator company--president    Comment: Retired  Tobacco Use   Smoking status: Former    Types: Cigarettes, Pipe  Quit date: 07/08/1993    Years since quitting: 28.4   Smokeless tobacco: Never  Vaping Use   Vaping Use: Never used  Substance and Sexual Activity   Alcohol use: Yes    Comment: minimal   Drug use: No   Sexual activity: Not on file  Other Topics Concern   Not on file  Social History Narrative   He played tennis 3x a week before moving to [place].   Right-handed.   2 cups caffeine per day.      Social Determinants of Health   Financial Resource Strain: Low Risk  (11/09/2021)   Overall Financial Resource Strain (CARDIA)    Difficulty of Paying Living Expenses: Not hard at all  Food Insecurity: No Food Insecurity (11/09/2021)   Hunger Vital Sign    Worried About Running  Out of Food in the Last Year: Never true    Ran Out of Food in the Last Year: Never true  Transportation Needs: No Transportation Needs (11/09/2021)   PRAPARE - Administrator, Civil Service (Medical): No    Lack of Transportation (Non-Medical): No  Physical Activity: Sufficiently Active (11/09/2021)   Exercise Vital Sign    Days of Exercise per Week: 6 days    Minutes of Exercise per Session: 30 min  Stress: No Stress Concern Present (11/09/2021)   [employer] of Occupational Health - Occupational Stress Questionnaire    Feeling of Stress : Not at all  Social Connections: Socially Integrated (11/09/2021)   Social Connection and Isolation Panel [NHANES]    Frequency of Communication with Friends and Family: More than three times a week    Frequency of Social Gatherings with Friends and Family: More than three times a week    Attends Religious Services: More than 4 times per year    Active Member of [employer] or Organizations: Yes    Attends Engineer, structural: More than 4 times per year    Marital Status: Married     Family History: The patient's family history includes Kidney disease in his father; Other in his mother. There is no history of Heart disease, Cancer, Colon cancer, Esophageal cancer, Inflammatory bowel disease, Liver disease, Pancreatic cancer, Rectal cancer, or Stomach cancer.  ROS:   Please see the history of present illness.    All other systems reviewed and are negative.  EKGs/Labs/Other Studies Reviewed:    The following studies were reviewed today: Echo 07/07/21:  1. Left ventricular ejection fraction, by estimation, is 60 to 65%. The  left ventricle has normal function. The left ventricle has no regional  wall motion abnormalities. Left ventricular diastolic parameters are  consistent with Grade I diastolic  dysfunction (impaired relaxation).   2. Right ventricular systolic function is normal. The right ventricular  size is normal.   3.  Left atrial size was mildly dilated.   4. The mitral valve is normal in structure. Mild mitral valve  regurgitation. No evidence of mitral stenosis.   5. The aortic valve is tricuspid. There is mild calcification of the  aortic valve. There is mild thickening of the aortic valve. Aortic valve  regurgitation is mild. Aortic valve sclerosis/calcification is present,  without any evidence of aortic  stenosis.   6. Pulmonic valve regurgitation is moderate.   7. The inferior vena cava is normal in size with greater than 50%  respiratory variability, suggesting right atrial pressure of 3 mmHg.  EKG:  EKG is ordered today.  The ekg ordered today demonstrates  sinus bradycardia 56 bpm, possible anterior infarct age undetermined.  Recent Labs: 10/15/2021: ALT 16; BUN 41; Creatinine, Ser 2.32; Hemoglobin 12.7; Platelets 257.0; Potassium 4.5; Sodium 141; TSH 5.68  Recent Lipid Panel    Component Value Date/Time   CHOL 126 10/15/2021 1142   TRIG 82.0 10/15/2021 1142   TRIG 48 04/05/2006 0750   HDL 40.00 10/15/2021 1142   CHOLHDL 3 10/15/2021 1142   VLDL 16.4 10/15/2021 1142   LDLCALC 70 10/15/2021 1142     Risk Assessment/Calculations:           Physical Exam:    VS:  BP 130/70   Pulse (!) 56   Ht 5\' 11"  (1.803 m)   Wt 197 lb 9.6 oz (89.6 kg)   SpO2 97%   BMI 27.56 kg/m     Wt Readings from Last 3 Encounters:  12/15/21 197 lb 9.6 oz (89.6 kg)  11/04/21 201 lb 9.6 oz (91.4 kg)  10/15/21 195 lb (88.5 kg)     GEN:  Well nourished, well developed pleasant elderly male in no acute distress HEENT: Normal NECK: No JVD; No carotid bruits LYMPHATICS: No lymphadenopathy CARDIAC: RRR, no murmurs, rubs, gallops RESPIRATORY:  Clear to auscultation without rales, wheezing or rhonchi  ABDOMEN: Soft, non-tender, non-distended MUSCULOSKELETAL: 1+ bilateral pretibial edema; No deformity  SKIN: Warm and dry NEUROLOGIC:  Alert and oriented x 3 PSYCHIATRIC:  Normal affect   ASSESSMENT:     1. Essential hypertension   2. Stage 3b chronic kidney disease (HCC)   3. Bilateral leg edema    PLAN:    In order of problems listed above:  Review home blood pressures.  Systolic readings are in the 130-150s.  Diastolic pressures are in good range.  Limited options in this patient with stage IIIb chronic kidney disease.  We will continue amlodipine 5 mg daily.  Today's office reading is in good range especially considering his age.  While his antihypertensive treatment options are limited in the setting of his chronic kidney disease and bradycardia (no ACE/ARB/beta-blocker), we will need to keep a low threshold to either reduce or discontinue amlodipine because I suspect it is contributing to his leg edema. Discussed the importance of adequate fluid intake, avoidance of sodium, and avoidance of nephrotoxic drugs such as nonsteroidal anti-inflammatories.  Trend of his creatinine over time and he is slowly trending upward with creatinine a few years ago in the range of 1.8-2.0.  Most recent creatinine is 2.33. Suspect primarily due to venous insufficiency.  His echo from January is reviewed and shows normal LV systolic function, no significant valvular disease, and only grade 1 diastolic dysfunction.  He has lost some weight with reducing his sodium.  Leg edema is improved with elevation and compression stockings.  I am going to write him a prescription for furosemide 40 mg to be used on an as-needed basis.  I really do not want him taking this more than once a week because of his renal dysfunction and he understands this.  Again consider dose reduction of amlodipine or discontinuation in the future if leg edema does not improve.  Limited treatment options for his hypertension as outlined above.     Medication Adjustments/Labs and Tests Ordered: Current medicines are reviewed at length with the patient today.  Concerns regarding medicines are outlined above.  Orders Placed This Encounter   Procedures   EKG 12-Lead   Meds ordered this encounter  Medications   furosemide (LASIX) 40 MG tablet    Sig:  Take 1 tablet by mouth as needed for fluid retention (once weekly)    Dispense:  10 tablet    Refill:  3    Patient Instructions  Medication Instructions:  Furosemide 40mg  once weekly/as needed *If you need a refill on your cardiac medications before your next appointment, please call your pharmacy*   Lab Work: NONE If you have labs (blood work) drawn today and your tests are completely normal, you will receive your results only by: MyChart Message (if you have MyChart) OR A paper copy in the mail If you have any lab test that is abnormal or we need to change your treatment, we will call you to review the results.   Testing/Procedures: NONE   Follow-Up: At [facility], you and your health needs are our priority.  As part of our continuing mission to provide you with exceptional heart care, we have created designated Provider Care Teams.  These Care Teams include your primary Cardiologist (physician) and Advanced Practice Providers (APPs -  Physician Assistants and Nurse Practitioners) who all work together to provide you with the care you need, when you need it.  We recommend signing up for the patient portal called "MyChart".  Sign up information is provided on this After Visit Summary.  MyChart is used to connect with patients for Virtual Visits (Telemedicine).  Patients are able to view lab/test results, encounter notes, upcoming appointments, etc.  Non-urgent messages can be sent to your provider as well.   To learn more about what you can do with MyChart, go to [email or web address].    Your next appointment:   3 month(s)  The format for your next appointment:   In Person  Provider:   Eligha Bridegroom, then 6 months with Dr Excell Seltzer {     Important Information About Sugar         Signed, Tonny Bollman, MD  12/17/2021 7:35 AM    [facility]

## 2021-12-30 ENCOUNTER — Other Ambulatory Visit: Payer: Self-pay | Admitting: Internal Medicine

## 2022-01-04 DIAGNOSIS — H02005 Unspecified entropion of left lower eyelid: Secondary | ICD-10-CM | POA: Diagnosis not present

## 2022-02-02 DIAGNOSIS — L723 Sebaceous cyst: Secondary | ICD-10-CM | POA: Diagnosis not present

## 2022-02-02 DIAGNOSIS — Z85828 Personal history of other malignant neoplasm of skin: Secondary | ICD-10-CM | POA: Diagnosis not present

## 2022-02-02 DIAGNOSIS — D225 Melanocytic nevi of trunk: Secondary | ICD-10-CM | POA: Diagnosis not present

## 2022-02-02 DIAGNOSIS — I8312 Varicose veins of left lower extremity with inflammation: Secondary | ICD-10-CM | POA: Diagnosis not present

## 2022-02-02 DIAGNOSIS — I872 Venous insufficiency (chronic) (peripheral): Secondary | ICD-10-CM | POA: Diagnosis not present

## 2022-02-02 DIAGNOSIS — D2262 Melanocytic nevi of left upper limb, including shoulder: Secondary | ICD-10-CM | POA: Diagnosis not present

## 2022-02-02 DIAGNOSIS — L570 Actinic keratosis: Secondary | ICD-10-CM | POA: Diagnosis not present

## 2022-02-02 DIAGNOSIS — L814 Other melanin hyperpigmentation: Secondary | ICD-10-CM | POA: Diagnosis not present

## 2022-02-02 DIAGNOSIS — D692 Other nonthrombocytopenic purpura: Secondary | ICD-10-CM | POA: Diagnosis not present

## 2022-02-02 DIAGNOSIS — L821 Other seborrheic keratosis: Secondary | ICD-10-CM | POA: Diagnosis not present

## 2022-02-02 DIAGNOSIS — I8311 Varicose veins of right lower extremity with inflammation: Secondary | ICD-10-CM | POA: Diagnosis not present

## 2022-02-02 DIAGNOSIS — D224 Melanocytic nevi of scalp and neck: Secondary | ICD-10-CM | POA: Diagnosis not present

## 2022-02-12 DIAGNOSIS — D3131 Benign neoplasm of right choroid: Secondary | ICD-10-CM | POA: Diagnosis not present

## 2022-02-12 DIAGNOSIS — H52203 Unspecified astigmatism, bilateral: Secondary | ICD-10-CM | POA: Diagnosis not present

## 2022-02-12 DIAGNOSIS — H353132 Nonexudative age-related macular degeneration, bilateral, intermediate dry stage: Secondary | ICD-10-CM | POA: Diagnosis not present

## 2022-02-12 DIAGNOSIS — H35373 Puckering of macula, bilateral: Secondary | ICD-10-CM | POA: Diagnosis not present

## 2022-02-23 DIAGNOSIS — B351 Tinea unguium: Secondary | ICD-10-CM | POA: Diagnosis not present

## 2022-02-23 DIAGNOSIS — E1159 Type 2 diabetes mellitus with other circulatory complications: Secondary | ICD-10-CM | POA: Diagnosis not present

## 2022-02-23 DIAGNOSIS — L84 Corns and callosities: Secondary | ICD-10-CM | POA: Diagnosis not present

## 2022-03-14 NOTE — Progress Notes (Unsigned)
Cardiology Office Note:    Date:  03/17/2022   ID:  [name], DOB 04/24/1928, MRN [id number]  PCP:  Binnie Rail, MD   [facility] Providers Cardiologist:  Sherren Mocha, MD     Referring MD: Binnie Rail, MD   Chief Complaint: bilateral leg edema  History of Present Illness:    [name] is a very pleasant 88 y.o. male with a hx of hypertension, syncope, hyperlipidemia, chronic leg swelling.   Normal stress myoview with no chest pain, no ecg changes and no evidence of ischemia or infarction, EF 62% 03/2005. Has been followed by cardiology for many years for hypertension and syncope.   He was last seen by Dr. Burt Knack on 06/08/21 and reported he was experiencing lightheadedness and weakness when going from bed to standing at night to use the bathroom, no symptoms without position change. Was evaluated in the ED Oct 2022 with MRI of brain that did not demonstrate evidence of stroke. Was started on amlodipine 5 mg by Dr. Burt Knack for elevated BP as reported by patient's daughter via [place] on 07/07/21.  I saw him 11/04/21 for evaluation of leg swelling. His friend at [facility] who is an MD and advised that he contact us. Was having bilateral leg swelling that started 6-7 weeks ago, no SOB. Saw PCP, Dr. Quay Burow, 3 weeks ago and she advised leg elevation and compression. Admits he loves salty food and has been eating lots of soup. Thinks he has gained about 8 lbs recently (10 lbs on our scale from December 2022). He denied dyspnea, orthopnea, PND, chest pain, presyncope, syncope. Continuing to be physically active at [employer].   Seen by Dr. Burt Knack 12/15/21 at which time he reported leg edema improved with reduction in sodium, elevation, and compression socks.  He was given furosemide 40 mg to use as needed for leg edema but advised to use not more than once a week due to renal dysfunction with this recent serum creatinine at 2.33. Recommendation by Dr. Burt Knack to allow slightly  higher BP in the setting of CKD and leg edema, may need to reduce or discontinue amlodipine. Advised to return in 3 months for follow-up.   Today, he is here with his wife for follow-up. Reports he is feeling well with the exception of bothered by getting up twice during the night to urinate. This started when he began elevating his legs during the night. Not sleeping well.  Wakes up with "skinny" ankles. Wearing compression stockings today. Weight is down on our scale. Now eating low sodium soup and not using the salt shaker. Unsure as to whether he is taking furosemide.  States he takes all of his medications daily. We contacted his pharmacy who advised that furosemide 40 mg #10 tabs was sent to patient in June. Rx was for 1 pill each week. He denies chest pain, shortness of breath, fatigue, palpitations, melena, hematuria, hemoptysis, diaphoresis, weakness, presyncope, syncope, orthopnea, and PND. Continues to walk 30 minutes daily for exercise. His wife reports that he frequently naps with his legs on the floor. They have lounge doctor leg rest, but he is only using it at night.   Past Medical History:  Diagnosis Date   Anxiety    BRADYCARDIA 11/13/2008   Cataract    Cholelithiasis    COLONIC POLYPS, HX OF 02/04/2007   DEPRESSION 02/04/2007   DIABETES MELLITUS, TYPE II 02/04/2007   DIVERTICULOSIS, COLON 02/04/2007   FOREIGN BODY, ASPIRATION 12/04/2007   GERD (gastroesophageal  reflux disease)    GLAUCOMA 03/08/2009   Hepatitis A 1963   HYPERLIPIDEMIA 12/04/2007   HYPERTENSION 02/04/2007   Memory loss    OBSTRUCTIVE SLEEP APNEA 11/05/2009   -PSG 01/05/10 RDI 11, PLMI 56   OSTEOARTHRITIS 02/04/2007   Peptic stricture of esophagus    PROSTATE CANCER 1997   RENAL INSUFFICIENCY 11/13/2008   SYNCOPE 12/31/2009   THROMBOCYTOPENIA 04/17/2008   THYROID NODULE, RIGHT 11/13/2008   UNSPECIFIED ANEMIA 11/07/2007    Past Surgical History:  Procedure Laterality Date   Heart Cartherization  2000   PROSTATECTOMY  1997    TONSILLECTOMY  1940's    Current Medications: Current Meds  Medication Sig   amLODipine (NORVASC) 5 MG tablet Take 1 tablet (5 mg total) by mouth daily.   Cholecalciferol (VITAMIN D) 2000 UNITS CAPS Take 2 capsules by mouth daily.   escitalopram (LEXAPRO) 5 MG tablet Take 1 tablet (5 mg total) by mouth daily.   ferrous sulfate 325 (65 FE) MG EC tablet Take 1 tablet (325 mg total) by mouth every other day.   levothyroxine (SYNTHROID) 50 MCG tablet Take 1 tablet (50 mcg total) by mouth daily.   polycarbophil (FIBERCON) 625 MG tablet Take 625 mg by mouth daily.   polyethylene glycol (MIRALAX / GLYCOLAX) 17 g packet Take 17 g by mouth daily. Take 1 to 2 capfuls daily at noon   rosuvastatin (CRESTOR) 40 MG tablet TAKE ONE TABLET BY MOUTH EVERY DAY   traZODone (DESYREL) 100 MG tablet TAKE ONE TABLET AT BEDTIME.   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   Right-handed.   2 cups caffeine per day.      Social Determinants of Health   Financial Resource Strain: Low Risk  (11/09/2021)   Overall Financial Resource Strain  (CARDIA)    Difficulty of Paying Living Expenses: Not hard at all  Food Insecurity: No Food Insecurity (11/09/2021)   Hunger Vital Sign    Worried About Running Out of Food in the Last Year: Never true    Ran Out of Food in the Last Year: Never true  Transportation Needs: No Transportation Needs (11/09/2021)   PRAPARE - Hydrologist (Medical): No    Lack of Transportation (Non-Medical): No  Physical Activity: Sufficiently Active (11/09/2021)   Exercise Vital Sign    Days of Exercise per Week: 6 days    Minutes of Exercise per Session: 30 min  Stress: No Stress Concern Present (11/09/2021)   [place]  [facility]    Feeling of Stress : Not at all  Social Connections: Socially Integrated (11/09/2021)   Social Connection and Isolation Panel [NHANES]    Frequency of Communication with Friends and Family: More than three times a week    Frequency of Social Gatherings with Friends and Family: More than three times a week    Attends Religious Services: More than 4 times per year    Active Member of [employer] or Organizations: Yes    Attends Music therapist: More than 4 times per year    Marital Status: Married     Family History: The patient's family history includes Kidney disease in his father; Other in his mother. There is no history of Heart disease, Cancer, Colon cancer, Esophageal cancer, Inflammatory bowel disease, Liver disease, Pancreatic cancer, Rectal cancer, or Stomach cancer.  ROS:   Please see the history of present illness.    + bilateral leg edema All other systems reviewed and are negative.  Labs/Other Studies Reviewed:    The following studies were reviewed today:  Echo 07/07/21  1. Left ventricular ejection fraction, by estimation, is 60 to 65%. The  left ventricle has normal function. The left ventricle has no regional  wall motion abnormalities. Left ventricular diastolic  parameters are  consistent with Grade I diastolic  dysfunction (impaired relaxation).   2. Right ventricular systolic function is normal. The right ventricular  size is normal.   3. Left atrial size was mildly dilated.   4. The mitral valve is normal in structure. Mild mitral valve  regurgitation. No evidence of mitral stenosis.   5. The aortic valve is tricuspid. There is mild calcification of the  aortic valve. There is mild thickening of the aortic valve. Aortic valve  regurgitation is mild. Aortic valve sclerosis/calcification is present,  without any evidence of aortic  stenosis.   6. Pulmonic valve regurgitation is moderate.   7. The inferior vena cava is normal in size with greater than 50%  respiratory variability, suggesting right atrial pressure of 3 mmHg.    Cardiac monitor 07/05/21  The basic rhythm is normal sinus with an average HR of 57 bpm No atrial fibrillation or flutter No high-grade heart block or pathologic pauses There are no PVC's and no supraventricular beats with several short supraventricular runs, the longest lasting 16 seconds at a rate of 99 bpm.    Myocardial Perfusion Study 03/2005  Normal stress myoview with no chest pain, no evidence of ischemia or infarction LVEF 62% and normal wall motion  Recent Labs: 10/15/2021: ALT 16; BUN 41; Creatinine, Ser 2.32; Hemoglobin 12.7; Platelets 257.0; Potassium 4.5; Sodium 141; TSH 5.68  Recent Lipid Panel    Component Value Date/Time   CHOL 126 10/15/2021 1142   TRIG 82.0 10/15/2021 1142   TRIG 48 04/05/2006 0750   HDL 40.00 10/15/2021 1142   CHOLHDL 3 10/15/2021 1142   VLDL 16.4 10/15/2021 1142   LDLCALC 70 10/15/2021 1142     Risk Assessment/Calculations:       Physical Exam:    VS:  BP (!) 150/70   Pulse (!) 56   Ht '5\' 11"'$  (1.803 m)   Wt 193 lb (87.5 kg)   SpO2 98%   BMI 26.92 kg/m     Wt Readings from Last 3 Encounters:  03/17/22 193 lb (87.5 kg)  12/15/21 197 lb 9.6 oz (89.6 kg)   11/04/21 201 lb 9.6 oz (91.4 kg)  GEN:  Well nourished, well developed in no acute distress HEENT: Normal NECK: No JVD; No carotid bruits CARDIAC: RRR, no murmurs, rubs, gallops RESPIRATORY:  Clear to auscultation without rales, wheezing or rhonchi  ABDOMEN: Soft, non-tender, non-distended MUSCULOSKELETAL:  1+ pitting edema bilateral LE; No deformity. 2+ pedal pulses, equal bilaterally SKIN: Warm and dry NEUROLOGIC:  Alert and oriented x 3 PSYCHIATRIC:  Normal affect   EKG:  EKG is not ordered today.    HYPERTENSION CONTROL Vitals:   03/17/22 1034 03/17/22 1206  BP: (!) 150/68 (!) 150/70    The patient's blood pressure is elevated above target today.  In order to address the patient's elevated BP: Blood pressure will be monitored at home to determine if medication changes need to be made.       Diagnoses:    1. Syncope and collapse   2. Essential hypertension   3. Stage 3b chronic kidney disease ([place])   4. Bilateral leg edema     Assessment and Plan:     Bilateral leg swelling: Continues to have moderate bilateral leg edema. Compliant with compression socks. Has reduced sodium in diet. Encouraged leg elevation for 30 minutes twice daily on the lounge doctor and not to sleep with legs elevated. Will refill furosemide 40 mg which he will take once weekly on Sundays (wife takes a medication once weekly on this day and will ensure compliance). Will check bmet today.     Hypertension: BP is elevated today and remains elevated upon my recheck. BP readings from [facility] are elevated as well with the exception of 2 occasions when BP was 138-140/80. He has reduced dietary intake of sodiium. GDMT limited 2/2 CKD and history of syncope. Will check BMET today. Per Dr. Burt Knack, primary cardiologist tolerating SBP 150 in setting of postural hypotension.   Syncope and collapse: Remote history. Cardiac monitor 07/05/21 with no high grade AV block or significant arrhythmia  contributing. No recent symptoms of lightheadedness, presyncope, syncope.   CKD: Creatinine 2.32, gfr 23.87 on 10/15/21. Rechecking today.   Disposition:  3 months with Dr. Burt Knack   Medication Adjustments/Labs and Tests Ordered: Current medicines are reviewed at length with the patient today.  Concerns regarding medicines are outlined above.  Orders Placed This Encounter  Procedures   Basic Metabolic Panel (BMET)   Meds ordered this encounter  Medications   furosemide (LASIX) 40 MG tablet    Sig: Take 1 tablet by mouth as needed for fluid retention (once weekly on Sundays).    Dispense:  10 tablet    Refill:  3    Patient Instructions  Medication Instructions:   TAKE Lasix one (1) tablet by mouth ( 40 mg) every Sunday.  *If you need a refill on your cardiac medications before your next appointment, please call your pharmacy*   Lab Work:  TODAY!!!! BMET  If you have labs (blood work) drawn today and your tests are completely normal, you will receive your results only by: [place] (if you have MyChart) OR A paper copy in the mail If you have any lab test that is abnormal or we need to change your treatment, we will call you to review the results.   Testing/Procedures:  None ordered.   Follow-Up: At [facility], you and your health needs are our priority.  As part of our continuing mission to provide you with exceptional heart care, we have created designated Provider Care Teams.  These Care Teams include your primary Cardiologist (physician) and Advanced  Practice Providers (APPs -  Physician Assistants and Nurse Practitioners) who all work together to provide you with the care you need, when you need it.  We recommend signing up for the patient portal called "MyChart".  Sign up information is provided on this After Visit Summary.  MyChart is used to connect with patients for Virtual Visits (Telemedicine).  Patients are able to view lab/test results,  encounter notes, upcoming appointments, etc.  Non-urgent messages can be sent to your provider as well.   To learn more about what you can do with MyChart, go to [email or web address].    Your next appointment:   3 month(s)  The format for your next appointment:   In Person  Provider:   Sherren Mocha, MD     Other Instructions  PLEASE ELEVATE YOUR LEGS FOR 30 MINUTES TWICE DAILY.   Important Information About Sugar         Signed, Emmaline Life, NP  03/17/2022 12:12 PM    [place]

## 2022-03-15 ENCOUNTER — Ambulatory Visit: Payer: PPO | Admitting: Nurse Practitioner

## 2022-03-17 ENCOUNTER — Ambulatory Visit: Payer: PPO | Attending: Nurse Practitioner | Admitting: Nurse Practitioner

## 2022-03-17 ENCOUNTER — Encounter: Payer: Self-pay | Admitting: Nurse Practitioner

## 2022-03-17 VITALS — BP 150/70 | HR 56 | Ht 71.0 in | Wt 193.0 lb

## 2022-03-17 DIAGNOSIS — R600 Localized edema: Secondary | ICD-10-CM | POA: Diagnosis not present

## 2022-03-17 DIAGNOSIS — R55 Syncope and collapse: Secondary | ICD-10-CM

## 2022-03-17 DIAGNOSIS — I10 Essential (primary) hypertension: Secondary | ICD-10-CM

## 2022-03-17 DIAGNOSIS — N1832 Chronic kidney disease, stage 3b: Secondary | ICD-10-CM

## 2022-03-17 LAB — BASIC METABOLIC PANEL
BUN/Creatinine Ratio: 18 (ref 10–24)
BUN: 47 mg/dL — ABNORMAL HIGH (ref 10–36)
CO2: 24 mmol/L (ref 20–29)
Calcium: 9.2 mg/dL (ref 8.6–10.2)
Chloride: 105 mmol/L (ref 96–106)
Creatinine, Ser: 2.65 mg/dL — ABNORMAL HIGH (ref 0.76–1.27)
Glucose: 152 mg/dL — ABNORMAL HIGH (ref 70–99)
Potassium: 5.1 mmol/L (ref 3.5–5.2)
Sodium: 145 mmol/L — ABNORMAL HIGH (ref 134–144)
eGFR: 22 mL/min/{1.73_m2} — ABNORMAL LOW (ref >59)

## 2022-03-17 MED ORDER — FUROSEMIDE 40 MG PO TABS: ORAL_TABLET | ORAL | 3 refills | Status: DC

## 2022-03-17 NOTE — Patient Instructions (Signed)
Medication Instructions:   TAKE Lasix one (1) tablet by mouth ( 40 mg) every Sunday.  *If you need a refill on your cardiac medications before your next appointment, please call your pharmacy*   Lab Work:  TODAY!!!! BMET  If you have labs (blood work) drawn today and your tests are completely normal, you will receive your results only by: [place] (if you have MyChart) OR A paper copy in the mail If you have any lab test that is abnormal or we need to change your treatment, we will call you to review the results.   Testing/Procedures:  None ordered.   Follow-Up: At [facility], you and your health needs are our priority.  As part of our continuing mission to provide you with exceptional heart care, we have created designated Provider Care Teams.  These Care Teams include your primary Cardiologist (physician) and Advanced Practice Providers (APPs -  Physician Assistants and Nurse Practitioners) who all work together to provide you with the care you need, when you need it.  We recommend signing up for the patient portal called "MyChart".  Sign up information is provided on this After Visit Summary.  MyChart is used to connect with patients for Virtual Visits (Telemedicine).  Patients are able to view lab/test results, encounter notes, upcoming appointments, etc.  Non-urgent messages can be sent to your provider as well.   To learn more about what you can do with MyChart, go to [email or web address].    Your next appointment:   3 month(s)  The format for your next appointment:   In Person  Provider:   Sherren Mocha, MD     Other Instructions  PLEASE ELEVATE YOUR LEGS FOR 30 MINUTES TWICE DAILY.   Important Information About Sugar

## 2022-03-19 ENCOUNTER — Other Ambulatory Visit: Payer: Self-pay | Admitting: *Deleted

## 2022-03-19 DIAGNOSIS — Z79899 Other long term (current) drug therapy: Secondary | ICD-10-CM

## 2022-03-19 DIAGNOSIS — N1832 Chronic kidney disease, stage 3b: Secondary | ICD-10-CM

## 2022-03-21 ENCOUNTER — Telehealth: Payer: Self-pay | Admitting: Nurse Practitioner

## 2022-03-21 NOTE — Telephone Encounter (Signed)
Patient called the answering service on Sunday, 03/21/2022 with complaints of constipation x4 days with associated gas pain.  [name] is a 88 y.o. male with a pmh significant for Diverticulosis, Prior Colon Polyps, incomplete colonoscopy, HTN, HLD, Cholelithiasis, MDD/Anxiety, DM, GERD, Prostate Cancer, OSA, Glaucoma, CRI, Self-Reported Lactose Intolerance chronic constipation.   No BM for the past 3 to 4 days.  He is passing gas per the rectum.  He describes having gas pain which has somewhat worsened over the past few days.  But is not severe.  Nausea or vomiting.  He has ampuls for Linzess 72 mcg tablet which Dr. Rush Landmark gave him more than 1 year ago.  He questions if he should take Linzess at this time.  He takes MiraLAX most days but sometimes skips a day.  However, he has taken MiraLAX f insistently or the past 3 days.  I instructed [name] him to take MiraLAX twice daily, to use a glycerin suppository now to facilitate stool output.  He was instructed to go to the emergency room if he develops worsening lower abdominal pain as he is at risk for diverticulitis.  I will forward this message to Dr. Rush Landmark and his nurse so they can check on him tomorrow  Patty, pls contact patient on Monday 9/25 and obtain a symptom update. THX

## 2022-03-21 NOTE — Telephone Encounter (Signed)
Agree with this plan of action. Patty or covering RN, When you call the patient tomorrow if he still having issues I am okay for him to take Linzess or potentially even double doses Linzess, but if things worsen or progress, please send him to the emergency department. Thanks. GM

## 2022-03-22 ENCOUNTER — Telehealth: Payer: Self-pay | Admitting: Gastroenterology

## 2022-03-22 NOTE — Telephone Encounter (Signed)
Inbound call from patient having issued with gas. Patient states he spoke to a provider yesterday and they suggested A suppository. Patient states that did not help and is inquiring on any other suggestions. Please give a call to further advise.  Thank you

## 2022-03-22 NOTE — Telephone Encounter (Signed)
The pt has not tried linzess as of today. He has 72 mcg on hand. He will try two linzess at this time and if he does not have a response he will go to the ED as recommended.

## 2022-03-22 NOTE — Telephone Encounter (Signed)
Left message on machine to call back  

## 2022-03-22 NOTE — Telephone Encounter (Signed)
See alternate phone note  

## 2022-03-23 ENCOUNTER — Inpatient Hospital Stay (HOSPITAL_BASED_OUTPATIENT_CLINIC_OR_DEPARTMENT_OTHER)
Admission: EM | Admit: 2022-03-23 | Discharge: 2022-03-27 | DRG: 389 | Disposition: A | Discharge status: Home / Self Care | Payer: PPO | Attending: Internal Medicine | Admitting: Internal Medicine

## 2022-03-23 ENCOUNTER — Emergency Department (HOSPITAL_BASED_OUTPATIENT_CLINIC_OR_DEPARTMENT_OTHER): Payer: PPO

## 2022-03-23 ENCOUNTER — Other Ambulatory Visit: Payer: Self-pay

## 2022-03-23 ENCOUNTER — Encounter (HOSPITAL_COMMUNITY): Payer: Self-pay

## 2022-03-23 DIAGNOSIS — F32A Depression, unspecified: Secondary | ICD-10-CM | POA: Diagnosis not present

## 2022-03-23 DIAGNOSIS — Z87891 Personal history of nicotine dependence: Secondary | ICD-10-CM

## 2022-03-23 DIAGNOSIS — K5732 Diverticulitis of large intestine without perforation or abscess without bleeding: Secondary | ICD-10-CM

## 2022-03-23 DIAGNOSIS — E039 Hypothyroidism, unspecified: Secondary | ICD-10-CM | POA: Diagnosis not present

## 2022-03-23 DIAGNOSIS — N1832 Chronic kidney disease, stage 3b: Secondary | ICD-10-CM | POA: Diagnosis present

## 2022-03-23 DIAGNOSIS — I10 Essential (primary) hypertension: Secondary | ICD-10-CM | POA: Diagnosis not present

## 2022-03-23 DIAGNOSIS — D649 Anemia, unspecified: Secondary | ICD-10-CM | POA: Diagnosis present

## 2022-03-23 DIAGNOSIS — K56609 Unspecified intestinal obstruction, unspecified as to partial versus complete obstruction: Secondary | ICD-10-CM | POA: Diagnosis not present

## 2022-03-23 DIAGNOSIS — K5730 Diverticulosis of large intestine without perforation or abscess without bleeding: Secondary | ICD-10-CM | POA: Diagnosis not present

## 2022-03-23 DIAGNOSIS — Z79890 Hormone replacement therapy (postmenopausal): Secondary | ICD-10-CM | POA: Diagnosis not present

## 2022-03-23 DIAGNOSIS — N183 Chronic kidney disease, stage 3 unspecified: Secondary | ICD-10-CM | POA: Diagnosis present

## 2022-03-23 DIAGNOSIS — K449 Diaphragmatic hernia without obstruction or gangrene: Secondary | ICD-10-CM | POA: Diagnosis not present

## 2022-03-23 DIAGNOSIS — F419 Anxiety disorder, unspecified: Secondary | ICD-10-CM | POA: Diagnosis not present

## 2022-03-23 DIAGNOSIS — E860 Dehydration: Secondary | ICD-10-CM | POA: Diagnosis not present

## 2022-03-23 DIAGNOSIS — K56699 Other intestinal obstruction unspecified as to partial versus complete obstruction: Secondary | ICD-10-CM | POA: Diagnosis not present

## 2022-03-23 DIAGNOSIS — E785 Hyperlipidemia, unspecified: Secondary | ICD-10-CM | POA: Diagnosis present

## 2022-03-23 DIAGNOSIS — R140 Abdominal distension (gaseous): Secondary | ICD-10-CM | POA: Diagnosis not present

## 2022-03-23 DIAGNOSIS — K5792 Diverticulitis of intestine, part unspecified, without perforation or abscess without bleeding: Secondary | ICD-10-CM

## 2022-03-23 DIAGNOSIS — N184 Chronic kidney disease, stage 4 (severe): Secondary | ICD-10-CM | POA: Diagnosis present

## 2022-03-23 DIAGNOSIS — I129 Hypertensive chronic kidney disease with stage 1 through stage 4 chronic kidney disease, or unspecified chronic kidney disease: Secondary | ICD-10-CM | POA: Diagnosis not present

## 2022-03-23 DIAGNOSIS — Z9079 Acquired absence of other genital organ(s): Secondary | ICD-10-CM | POA: Diagnosis not present

## 2022-03-23 DIAGNOSIS — K529 Noninfective gastroenteritis and colitis, unspecified: Secondary | ICD-10-CM

## 2022-03-23 DIAGNOSIS — G4733 Obstructive sleep apnea (adult) (pediatric): Secondary | ICD-10-CM | POA: Diagnosis present

## 2022-03-23 DIAGNOSIS — Z79899 Other long term (current) drug therapy: Secondary | ICD-10-CM | POA: Diagnosis not present

## 2022-03-23 DIAGNOSIS — R933 Abnormal findings on diagnostic imaging of other parts of digestive tract: Secondary | ICD-10-CM

## 2022-03-23 DIAGNOSIS — K5909 Other constipation: Secondary | ICD-10-CM

## 2022-03-23 DIAGNOSIS — R911 Solitary pulmonary nodule: Secondary | ICD-10-CM | POA: Diagnosis present

## 2022-03-23 DIAGNOSIS — E1122 Type 2 diabetes mellitus with diabetic chronic kidney disease: Secondary | ICD-10-CM | POA: Diagnosis not present

## 2022-03-23 DIAGNOSIS — K5900 Constipation, unspecified: Secondary | ICD-10-CM | POA: Diagnosis present

## 2022-03-23 DIAGNOSIS — Z8546 Personal history of malignant neoplasm of prostate: Secondary | ICD-10-CM | POA: Diagnosis not present

## 2022-03-23 DIAGNOSIS — K219 Gastro-esophageal reflux disease without esophagitis: Secondary | ICD-10-CM | POA: Diagnosis present

## 2022-03-23 DIAGNOSIS — K6389 Other specified diseases of intestine: Secondary | ICD-10-CM | POA: Diagnosis present

## 2022-03-23 DIAGNOSIS — R1030 Lower abdominal pain, unspecified: Secondary | ICD-10-CM | POA: Diagnosis not present

## 2022-03-23 DIAGNOSIS — R109 Unspecified abdominal pain: Secondary | ICD-10-CM | POA: Diagnosis not present

## 2022-03-23 LAB — COMPREHENSIVE METABOLIC PANEL
ALT: 10 U/L (ref 0–44)
AST: 18 U/L (ref 15–41)
Albumin: 4.3 g/dL (ref 3.5–5.0)
Alkaline Phosphatase: 77 U/L (ref 38–126)
Anion gap: 11 (ref 5–15)
BUN: 52 mg/dL — ABNORMAL HIGH (ref 8–23)
CO2: 24 mmol/L (ref 22–32)
Calcium: 9.3 mg/dL (ref 8.9–10.3)
Chloride: 105 mmol/L (ref 98–111)
Creatinine, Ser: 2.71 mg/dL — ABNORMAL HIGH (ref 0.61–1.24)
GFR, Estimated: 21 mL/min — ABNORMAL LOW (ref >60)
Glucose, Bld: 119 mg/dL — ABNORMAL HIGH (ref 70–99)
Potassium: 4.4 mmol/L (ref 3.5–5.1)
Sodium: 140 mmol/L (ref 135–145)
Total Bilirubin: 0.6 mg/dL (ref 0.3–1.2)
Total Protein: 7 g/dL (ref 6.5–8.1)

## 2022-03-23 LAB — URINALYSIS, ROUTINE W REFLEX MICROSCOPIC
Bilirubin Urine: NEGATIVE
Glucose, UA: NEGATIVE mg/dL
Ketones, ur: NEGATIVE mg/dL
Leukocytes,Ua: NEGATIVE
Nitrite: NEGATIVE
Protein, ur: >300 mg/dL — AB
Specific Gravity, Urine: 1.009 (ref 1.005–1.030)
pH: 5.5 (ref 5.0–8.0)

## 2022-03-23 LAB — CBC WITH DIFFERENTIAL/PLATELET
Abs Immature Granulocytes: 0.1 10*3/uL — ABNORMAL HIGH (ref 0.00–0.07)
Basophils Absolute: 0 10*3/uL (ref 0.0–0.1)
Basophils Relative: 0 %
Eosinophils Absolute: 0 10*3/uL (ref 0.0–0.5)
Eosinophils Relative: 0 %
HCT: 40.6 % (ref 39.0–52.0)
Hemoglobin: 13.2 g/dL (ref 13.0–17.0)
Immature Granulocytes: 1 %
Lymphocytes Relative: 34 %
Lymphs Abs: 5.2 10*3/uL — ABNORMAL HIGH (ref 0.7–4.0)
MCH: 31.1 pg (ref 26.0–34.0)
MCHC: 32.5 g/dL (ref 30.0–36.0)
MCV: 95.8 fL (ref 80.0–100.0)
Monocytes Absolute: 1.5 10*3/uL — ABNORMAL HIGH (ref 0.1–1.0)
Monocytes Relative: 10 %
Neutro Abs: 8.2 10*3/uL — ABNORMAL HIGH (ref 1.7–7.7)
Neutrophils Relative %: 55 %
Platelets: 356 10*3/uL (ref 150–400)
RBC: 4.24 MIL/uL (ref 4.22–5.81)
RDW: 14.5 % (ref 11.5–15.5)
WBC: 15 10*3/uL — ABNORMAL HIGH (ref 4.0–10.5)
nRBC: 0 % (ref 0.0–0.2)

## 2022-03-23 LAB — TSH: TSH: 4.779 u[IU]/mL — ABNORMAL HIGH (ref 0.350–4.500)

## 2022-03-23 MED ORDER — HEPARIN SODIUM (PORCINE) 5000 UNIT/ML IJ SOLN
5000.0000 [IU] | Freq: Three times a day (TID) | INTRAMUSCULAR | Status: DC
  Administered 2022-03-23 – 2022-03-27 (×11): 5000 [IU] via SUBCUTANEOUS
  Filled 2022-03-23 (×10): qty 1

## 2022-03-23 MED ORDER — ONDANSETRON HCL 4 MG/2ML IJ SOLN: 4.0000 mg | Freq: Four times a day (QID) | INTRAMUSCULAR | Status: DC | PRN

## 2022-03-23 MED ORDER — GLYCERIN (LAXATIVE) 2 G RE SUPP
1.0000 | Freq: Once | RECTAL | Status: DC
  Filled 2022-03-23 (×2): qty 1

## 2022-03-23 MED ORDER — AMLODIPINE BESYLATE 5 MG PO TABS
5.0000 mg | ORAL_TABLET | Freq: Every day | ORAL | Status: DC
  Administered 2022-03-24: 5 mg via ORAL
  Filled 2022-03-23: qty 1

## 2022-03-23 MED ORDER — SODIUM CHLORIDE 0.9 % IV SOLN
3.0000 g | Freq: Two times a day (BID) | INTRAVENOUS | Status: DC
  Administered 2022-03-24 – 2022-03-27 (×8): 3 g via INTRAVENOUS
  Filled 2022-03-23 (×10): qty 8

## 2022-03-23 MED ORDER — BISACODYL 10 MG RE SUPP
10.0000 mg | Freq: Once | RECTAL | Status: AC
  Administered 2022-03-23: 10 mg via RECTAL
  Filled 2022-03-23: qty 1

## 2022-03-23 MED ORDER — ONDANSETRON HCL 4 MG PO TABS
4.0000 mg | ORAL_TABLET | Freq: Four times a day (QID) | ORAL | Status: DC | PRN
  Administered 2022-03-26: 4 mg via ORAL
  Filled 2022-03-23: qty 1

## 2022-03-23 MED ORDER — LACTATED RINGERS IV BOLUS
250.0000 mL | Freq: Once | INTRAVENOUS | Status: AC
  Administered 2022-03-23: 250 mL via INTRAVENOUS

## 2022-03-23 MED ORDER — SODIUM CHLORIDE 0.9 % IV SOLN: INTRAVENOUS | Status: DC

## 2022-03-23 MED ORDER — TRAZODONE HCL 50 MG PO TABS
100.0000 mg | ORAL_TABLET | Freq: Every day | ORAL | Status: DC
  Administered 2022-03-23 – 2022-03-26 (×4): 100 mg via ORAL
  Filled 2022-03-23 (×4): qty 2

## 2022-03-23 MED ORDER — SODIUM CHLORIDE 0.9 % IV SOLN: 3.0000 g | Freq: Two times a day (BID) | INTRAVENOUS | Status: DC

## 2022-03-23 MED ORDER — POLYETHYLENE GLYCOL 3350 17 G PO PACK
17.0000 g | PACK | Freq: Two times a day (BID) | ORAL | Status: DC
  Administered 2022-03-23 – 2022-03-27 (×7): 17 g via ORAL
  Filled 2022-03-23 (×8): qty 1

## 2022-03-23 MED ORDER — ACETAMINOPHEN 325 MG PO TABS: 650.0000 mg | ORAL_TABLET | Freq: Four times a day (QID) | ORAL | Status: DC | PRN

## 2022-03-23 MED ORDER — ROSUVASTATIN CALCIUM 20 MG PO TABS: 40.0000 mg | ORAL_TABLET | Freq: Every day | ORAL | Status: DC

## 2022-03-23 MED ORDER — ESCITALOPRAM OXALATE 10 MG PO TABS
5.0000 mg | ORAL_TABLET | Freq: Every day | ORAL | Status: DC
  Administered 2022-03-24 – 2022-03-27 (×4): 5 mg via ORAL
  Filled 2022-03-23 (×4): qty 1

## 2022-03-23 MED ORDER — LEVOTHYROXINE SODIUM 50 MCG PO TABS
50.0000 ug | ORAL_TABLET | Freq: Every day | ORAL | Status: DC
  Administered 2022-03-24 – 2022-03-27 (×4): 50 ug via ORAL
  Filled 2022-03-23 (×4): qty 1

## 2022-03-23 MED ORDER — ACETAMINOPHEN 650 MG RE SUPP: 650.0000 mg | Freq: Four times a day (QID) | RECTAL | Status: DC | PRN

## 2022-03-23 MED ORDER — SODIUM CHLORIDE 0.9 % IV SOLN
3.0000 g | Freq: Once | INTRAVENOUS | Status: AC
  Administered 2022-03-23: 3 g via INTRAVENOUS

## 2022-03-23 NOTE — ED Provider Notes (Signed)
[facility] EMERGENCY DEPT Provider Note   CSN: [id number] Arrival date & time: 03/23/22  0920     History  Chief Complaint  Patient presents with   Constipation    CORWIN KUIKEN is a 88 y.o. male.  HPI 88 year old male history of hypertension presents today complaining of constipation for several days.  He has had some tenderness in his left lower quadrant.  He had decreased p.o. intake yesterday due to advice of a friend.  He has not had a bowel movement for 4 days.  He has no prior history of diverticulitis.  He is not having significant pain but has some tenderness when he pushes on the area.  He has no change in his urinary status.  He has not had fever, chills, nausea, vomiting, or diarrhea.  His only prior abdominal surgery was prostate surgery 20+ years ago.     Home Medications Prior to Admission medications   Medication Sig Start Date End Date Taking? Authorizing Provider  amLODipine (NORVASC) 5 MG tablet Take 1 tablet (5 mg total) by mouth daily. 10/01/21   Sherren Mocha, MD  Cholecalciferol (VITAMIN D) 2000 UNITS CAPS Take 2 capsules by mouth daily.    [provider]  escitalopram (LEXAPRO) 5 MG tablet Take 1 tablet (5 mg total) by mouth daily. 10/15/21   Binnie Rail, MD  ferrous sulfate 325 (65 FE) MG EC tablet Take 1 tablet (325 mg total) by mouth every other day. 11/14/19   Reed, Tiffany L, DO  furosemide (LASIX) 40 MG tablet Take 1 tablet by mouth as needed for fluid retention (once weekly on Sundays). 03/17/22   Swinyer, Lanice Schwab, NP  levothyroxine (SYNTHROID) 50 MCG tablet Take 1 tablet (50 mcg total) by mouth daily. 10/18/21   Binnie Rail, MD  polycarbophil (FIBERCON) 625 MG tablet Take 625 mg by mouth daily.    [provider]  polyethylene glycol (MIRALAX / GLYCOLAX) 17 g packet Take 17 g by mouth daily. Take 1 to 2 capfuls daily at noon    [provider]  rosuvastatin (CRESTOR) 40 MG tablet TAKE ONE TABLET BY  MOUTH EVERY DAY 12/30/21   Binnie Rail, MD  traZODone (DESYREL) 100 MG tablet TAKE ONE TABLET AT BEDTIME. 05/20/21   Binnie Rail, MD      Allergies    Sulfa antibiotics and Sulfonamide derivatives    Review of Systems   Review of Systems  Physical Exam Updated Vital Signs BP (!) 162/67   Pulse 72   Temp 97.7 F (36.5 C) (Oral)   Resp 16   Ht 1.803 m ('5\' 11"'$ )   Wt 84.4 kg   SpO2 93%   BMI 25.94 kg/m  Physical Exam Vitals and nursing note reviewed.  HENT:     Head: Normocephalic.     Right Ear: External ear normal.     Left Ear: External ear normal.     Nose: Nose normal.     Mouth/Throat:     Mouth: Mucous membranes are moist.     Pharynx: Oropharynx is clear.  Cardiovascular:     Rate and Rhythm: Normal rate and regular rhythm.     Pulses: Normal pulses.  Pulmonary:     Effort: Pulmonary effort is normal.     Breath sounds: Normal breath sounds.  Abdominal:     General: Abdomen is flat. Bowel sounds are normal.     Comments: Ventral hernia noted that is reduces with laying flat Left lower  quadrant tenderness with palpation no rebound is noted  Genitourinary:    Rectum: Normal.  Musculoskeletal:        General: Normal range of motion.     Cervical back: Normal range of motion.  Skin:    General: Skin is warm.     Capillary Refill: Capillary refill takes less than 2 seconds.  Neurological:     General: No focal deficit present.     Mental Status: He is alert.  Psychiatric:        Mood and Affect: Mood normal.        Behavior: Behavior normal.     ED Results / Procedures / Treatments   Labs (all labs ordered are listed, but only abnormal results are displayed) Labs Reviewed  CBC WITH DIFFERENTIAL/PLATELET - Abnormal; Notable for the following components:      Result Value   WBC 15.0 (*)    Neutro Abs 8.2 (*)    Lymphs Abs 5.2 (*)    Monocytes Absolute 1.5 (*)    Abs Immature Granulocytes 0.10 (*)    All other components within normal limits   COMPREHENSIVE METABOLIC PANEL - Abnormal; Notable for the following components:   Glucose, Bld 119 (*)    BUN 52 (*)    Creatinine, Ser 2.71 (*)    GFR, Estimated 21 (*)    All other components within normal limits  URINALYSIS, ROUTINE W REFLEX MICROSCOPIC - Abnormal; Notable for the following components:   Hgb urine dipstick TRACE (*)    Protein, ur >300 (*)    All other components within normal limits    EKG None  Radiology CT ABDOMEN PELVIS WO CONTRAST  Result Date: 03/23/2022 CLINICAL DATA:  Acute left lower quadrant abdominal pain. EXAM: CT ABDOMEN AND PELVIS WITHOUT CONTRAST TECHNIQUE: Multidetector CT imaging of the abdomen and pelvis was performed following the standard protocol without IV contrast. RADIATION DOSE REDUCTION: This exam was performed according to the departmental dose-optimization program which includes automated exposure control, adjustment of the mA and/or kV according to patient size and/or use of iterative reconstruction technique. COMPARISON:  None Available. FINDINGS: Lower chest: Moderate size sliding-type hiatal hernia is noted. 4 mm nodule is noted posteriorly in the right lower lobe. Hepatobiliary: No focal liver abnormality is seen. No gallstones, gallbladder wall thickening, or biliary dilatation. Pancreas: Unremarkable. No pancreatic ductal dilatation or surrounding inflammatory changes. Spleen: Normal in size without focal abnormality. Adrenals/Urinary Tract: Adrenal glands are unremarkable. Kidneys are normal, without renal calculi, focal lesion, or hydronephrosis. Bladder is unremarkable. Stomach/Bowel: Moderate size sliding-type hiatal hernia is noted above. No small bowel dilatation or inflammation is noted. Large amount of stool seen throughout the colon which extends to the sigmoid colon where there is severe wall thickening and severe obstruction. Stool is seen in the more distal sigmoid colon and rectum. Diverticulosis is noted in this area. This is  highly concerning for malignancy, although some component of colitis or diverticulitis may be present as well. Vascular/Lymphatic: Aortic atherosclerosis. No enlarged abdominal or pelvic lymph nodes. Reproductive: Status post prostatectomy. Other: No abdominal wall hernia or abnormality. No abdominopelvic ascites. Musculoskeletal: No acute or significant osseous findings. IMPRESSION: There appears to be at least severe obstruction or near occlusion in the sigmoid colon with associated severe wall thickening, concerning for colitis or possibly colonic malignancy. Large amount of stool seen in the more proximal colon. There may be some component of diverticulitis as well. Sigmoidoscopy is recommended for further evaluation. Moderate size sliding-type hiatal hernia.  4 mm nodule noted in right lower lobe. Although likely benign, if the patient is high-risk, given the morphology and/or location of this nodule a non-contrast chest CT can be considered in 12 months.This recommendation follows the consensus statement: Guidelines for Management of Incidental Pulmonary Nodules Detected on CT Images: From the Fleischner Society 2017; Radiology 2017; 284:228-243. Aortic Atherosclerosis (ICD10-I70.0). Electronically Signed   By: Marijo Conception M.D.   On: 03/23/2022 12:01    Procedures Procedures    Medications Ordered in ED Medications  Ampicillin-Sulbactam (UNASYN) 3 g in sodium chloride 0.9 % 100 mL IVPB (0 g Intravenous Stopped 03/23/22 1409)  lactated ringers bolus 250 mL (0 mLs Intravenous Stopped 03/23/22 1147)  lactated ringers bolus 250 mL (0 mLs Intravenous Stopped 03/23/22 1311)    ED Course/ Medical Decision Making/ A&P Clinical Course as of 03/23/22 1408  Tue Mar 23, 2022  6754 Complete metabolic panel reviewed and interpreted significant for BUN elevated at 52 and creatinine of 2.71, patient appears to have chronic renal insufficiency but this does appear worse at baseline [DR]  1221 CBC reviewed  interpreted and significant for elevated white blood cell count at 15,000 [DR]    Clinical Course User Index [DR] Pattricia Boss, MD                           Medical Decision Making 88 year old male presents today with the patient and left lower quadrant pain for several days.  He has had some decreased p.o. intake but no vomiting, fever, chills Differential diagnosis includes but is not limited to bowel obstruction, diverticulitis, appendicitis, urinary tract infection, other infections, mass obstruction including malignancy. CT was obtained here in the ED and is significant for severe obstruction to near occlusion of the sigmoid colon with associated severe wall thickening concerning for colitis and possible colonic malignancy.  There is a large amount of stool seen in the proximal colon.  Plan treatment with IV fluids, n.p.o., Unasyn, will consult to hospitalist and GI for admission Discussed with Dr. Marcello Moores who accepts patient for admission  Amount and/or Complexity of Data Reviewed Labs: ordered. Decision-making details documented in ED Course. Radiology: ordered and independent interpretation performed. Decision-making details documented in ED Course.           Final Clinical Impression(s) / ED Diagnoses Final diagnoses:  Colitis    Rx / DC Orders ED Discharge Orders     None         Pattricia Boss, MD 03/23/22 1408

## 2022-03-23 NOTE — Consult Note (Signed)
Consultation Note   Referring Provider: [facility] PCP: Binnie Rail, MD Primary Gastroenterologist: Justice Britain, MD Reason for consultation: abnormal colon on CT scan   Hospital Day: 1  Assessment / Plan   # 87 yo male with chronic constipation and now obstipation with LLQ pain and CT scan showing severe obstruction or near occlusion in the sigmoid colon with associated severe wall thickening.  Large amount of stool seen in the more proximal colon and possibly a component of diverticulitis. Seems unlikely to be malignancy given the long segment of narrowing. Additionally he had this severe left colon stenosis on colonoscopy in 2019. Continue antibiotics for possible diverticulitis. If colon opens up then we can hopefully avoid flexible sigmoidoscopy. In interim with do gentle bowel purge with miralax and suppositories.  Getting Unasyn 3 grams Q 12 hours. If not improving over the next couple of days will consider flexible sigmoidoscopy.   Clear liquids are okay  # 4 mm RLL lung nodule on CT scan  See PMH for additional medical problems   HPI   [name] is a 88 y.o. male with a past medical history significant for  chronic constipation, diverticulosis, hiatal hernia, hypothyroidism, OSA, HTN, HLD, glaucoma. See PMH for any additional medical problems.  Patient has a history of chronic constipation. He had a partial colonoscopy in 2019. There was stenosis of segment of left colon but area was able to be traversed and passed to transverse colon with adult endoscope. Patient's constipation improved, he and Dr. Rush Landmark decided not to reattempt the colonoscopy.   Seen in July 2022 for constipation. Put on bowel regimen of fiber, miralax and increased fluid intake. Plan was to pursue CT scan or maybe a reattempt at colonoscopy if not improving.   Patient called out office 03/03/22 with constipation x 5 days. Advised to  try suppositories which he did without relief. He then took a Linzess but still had no BM.   He presented to [facility] ED today with intermittent LLQ pain and ongoing constipation.  ED Evaluation:  VSS WBC 15, hgb 13.2 Cr 2.71 LFTs normal.  Non-con CTAP >> at least severe obstruction or near occlusion in the sigmoid colon with associated severe wall thickening, concerning for colitis or possibly colonic malignancy. Large amount of stool seen in the more proximal colon. There may be some component of diverticulitis as well.  Previous GI Evaluation    Dec 2019 colonoscopy - Non-thrombosed internal hemorrhoids found on digital rectal exam. - Diverticulosis in the recto-sigmoid colon, in the sigmoid colon, in the descending colon and in the transverse colon. This led to restriction of the colon and difficulty with passage of the colonoscope and the pediatric colonoscope. Only traversed this stenosis/region with the adult endoscope. - Incidental mucosal tear in the descending colon in region of a diverticula, this is near the difficult to pass region.. - Normal mucosa in the entire examined colon otherwise. Biopsied to rule out Microscopic colitis. Separate jar of rectal biopsies to rule out proctitis. 1. Surgical [P], random sites - BENIGN COLONIC MUCOSA WITH SURFACE EROSIONS - NO ACTIVE INFLAMMATION OR EVIDENCE OF MICROSCOPIC COLITIS - NO HIGH GRADE DYSPLASIA OR MALIGNANCY IDENTIFIED 2. Surgical [P], rectum - BENIGN COLONIC MUCOSA WITH  SURFACE EROSIONS - NO ACTIVE INFLAMMATION OR EVIDENCE OF MICROSCOPIC COLITIS - NO HIGH GRADE DYSPLASIA OR MALIGNANCY IDENTIFIED  -  Recent Labs and Imaging CT ABDOMEN PELVIS WO CONTRAST  Result Date: 03/23/2022 CLINICAL DATA:  Acute left lower quadrant abdominal pain. EXAM: CT ABDOMEN AND PELVIS WITHOUT CONTRAST TECHNIQUE: Multidetector CT imaging of the abdomen and pelvis was performed following the standard protocol without IV contrast. RADIATION DOSE  REDUCTION: This exam was performed according to the departmental dose-optimization program which includes automated exposure control, adjustment of the mA and/or kV according to patient size and/or use of iterative reconstruction technique. COMPARISON:  None Available. FINDINGS: Lower chest: Moderate size sliding-type hiatal hernia is noted. 4 mm nodule is noted posteriorly in the right lower lobe. Hepatobiliary: No focal liver abnormality is seen. No gallstones, gallbladder wall thickening, or biliary dilatation. Pancreas: Unremarkable. No pancreatic ductal dilatation or surrounding inflammatory changes. Spleen: Normal in size without focal abnormality. Adrenals/Urinary Tract: Adrenal glands are unremarkable. Kidneys are normal, without renal calculi, focal lesion, or hydronephrosis. Bladder is unremarkable. Stomach/Bowel: Moderate size sliding-type hiatal hernia is noted above. No small bowel dilatation or inflammation is noted. Large amount of stool seen throughout the colon which extends to the sigmoid colon where there is severe wall thickening and severe obstruction. Stool is seen in the more distal sigmoid colon and rectum. Diverticulosis is noted in this area. This is highly concerning for malignancy, although some component of colitis or diverticulitis may be present as well. Vascular/Lymphatic: Aortic atherosclerosis. No enlarged abdominal or pelvic lymph nodes. Reproductive: Status post prostatectomy. Other: No abdominal wall hernia or abnormality. No abdominopelvic ascites. Musculoskeletal: No acute or significant osseous findings. IMPRESSION: There appears to be at least severe obstruction or near occlusion in the sigmoid colon with associated severe wall thickening, concerning for colitis or possibly colonic malignancy. Large amount of stool seen in the more proximal colon. There may be some component of diverticulitis as well. Sigmoidoscopy is recommended for further evaluation. Moderate size  sliding-type hiatal hernia. 4 mm nodule noted in right lower lobe. Although likely benign, if the patient is high-risk, given the morphology and/or location of this nodule a non-contrast chest CT can be considered in 12 months.This recommendation follows the consensus statement: Guidelines for Management of Incidental Pulmonary Nodules Detected on CT Images: From the Fleischner Society 2017; Radiology 2017; 284:228-243. Aortic Atherosclerosis (ICD10-I70.0). Electronically Signed   By: Marijo Conception M.D.   On: 03/23/2022 12:01    Labs:  Recent Labs    03/23/22 1059  WBC 15.0*  HGB 13.2  HCT 40.6  PLT 356   Recent Labs    03/23/22 1059  NA 140  K 4.4  CL 105  CO2 24  GLUCOSE 119*  BUN 52*  CREATININE 2.71*  CALCIUM 9.3   Recent Labs    03/23/22 1059  PROT 7.0  ALBUMIN 4.3  AST 18  ALT 10  ALKPHOS 77  BILITOT 0.6   No results for input(s): "HEPBSAG", "HCVAB", "HEPAIGM", "HEPBIGM" in the last 72 hours. No results for input(s): "LABPROT", "INR" in the last 72 hours.  Past Medical History:  Diagnosis Date   Anxiety    BRADYCARDIA 11/13/2008   Cataract    Cholelithiasis    COLONIC POLYPS, HX OF 02/04/2007   DEPRESSION 02/04/2007   DIABETES MELLITUS, TYPE II 02/04/2007   DIVERTICULOSIS, COLON 02/04/2007   FOREIGN BODY, ASPIRATION 12/04/2007   GERD (gastroesophageal reflux disease)    GLAUCOMA 03/08/2009   Hepatitis A 1963  HYPERLIPIDEMIA 12/04/2007   HYPERTENSION 02/04/2007   Memory loss    OBSTRUCTIVE SLEEP APNEA 11/05/2009   -PSG 01/05/10 RDI 11, PLMI 56   OSTEOARTHRITIS 02/04/2007   Peptic stricture of esophagus    PROSTATE CANCER 1997   RENAL INSUFFICIENCY 11/13/2008   SYNCOPE 12/31/2009   THROMBOCYTOPENIA 04/17/2008   THYROID NODULE, RIGHT 11/13/2008   UNSPECIFIED ANEMIA 11/07/2007    Past Surgical History:  Procedure Laterality Date   Heart Cartherization  2000   PROSTATECTOMY  1997   TONSILLECTOMY  64's    Family History  Problem Relation Age of Onset   Kidney  disease Father        Bright's Disease - died at age 48   Other Mother        no signifincant health problems - died at age 50   Heart disease Neg Hx        No FH of Coronary Artery Disease   Cancer Neg Hx    Colon cancer Neg Hx    Esophageal cancer Neg Hx    Inflammatory bowel disease Neg Hx    Liver disease Neg Hx    Pancreatic cancer Neg Hx    Rectal cancer Neg Hx    Stomach cancer Neg Hx     Prior to Admission medications   Medication Sig Start Date End Date Taking? Authorizing Provider  amLODipine (NORVASC) 5 MG tablet Take 1 tablet (5 mg total) by mouth daily. 10/01/21  Yes Sherren Mocha, MD  Cholecalciferol (VITAMIN D) 2000 UNITS CAPS Take 2 capsules by mouth daily.   Yes [provider]  escitalopram (LEXAPRO) 5 MG tablet Take 1 tablet (5 mg total) by mouth daily. 10/15/21  Yes Burns, Claudina Lick, MD  ferrous sulfate 325 (65 FE) MG EC tablet Take 1 tablet (325 mg total) by mouth every other day. 11/14/19  Yes Reed, Tiffany L, DO  furosemide (LASIX) 40 MG tablet Take 1 tablet by mouth as needed for fluid retention (once weekly on Sundays). 03/17/22  Yes Swinyer, Lanice Schwab, NP  levothyroxine (SYNTHROID) 50 MCG tablet Take 1 tablet (50 mcg total) by mouth daily. 10/18/21  Yes Burns, Claudina Lick, MD  polycarbophil (FIBERCON) 625 MG tablet Take 625 mg by mouth daily.   Yes [provider]  polyethylene glycol (MIRALAX / GLYCOLAX) 17 g packet Take 17 g by mouth daily. Take 1 to 2 capfuls daily at noon   Yes [provider]  rosuvastatin (CRESTOR) 40 MG tablet TAKE ONE TABLET BY MOUTH EVERY DAY 12/30/21  Yes Binnie Rail, MD  traZODone (DESYREL) 100 MG tablet TAKE ONE TABLET AT BEDTIME. 05/20/21  Yes Burns, Claudina Lick, MD    Current Facility-Administered Medications  Medication Dose Route Frequency Provider Last Rate Last Admin   0.9 %  sodium chloride infusion   Intravenous Continuous Clance Boll, MD 75 mL/hr at 03/23/22 1532 New Bag at 03/23/22 1532   [START  ON 03/24/2022] Ampicillin-Sulbactam (UNASYN) 3 g in sodium chloride 0.9 % 100 mL IVPB  3 g Intravenous Q12H Clance Boll, MD        Allergies as of 03/23/2022 - Review Complete 03/23/2022  Allergen Reaction Noted   Sulfa antibiotics  06/09/2000   Sulfonamide derivatives      Social History   Socioeconomic History   Marital status: Married    Spouse name: [name]   Number of children: 3   Years of education: college   Highest education level: Bachelor's degree (e.g., BA, AB,  BS)  Occupational History   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   Right-handed.   2 cups caffeine per day.      Social Determinants of Health   Financial Resource Strain: Low Risk  (11/09/2021)   Overall Financial Resource Strain (CARDIA)    Difficulty of Paying Living Expenses: Not hard at all  Food Insecurity: No Food Insecurity (11/09/2021)   Hunger Vital Sign    Worried About Running Out of Food in the Last Year: Never true    Ran Out of Food in the Last Year: Never true  Transportation Needs: No Transportation Needs (11/09/2021)   PRAPARE - Hydrologist (Medical): No    Lack of Transportation (Non-Medical): No  Physical Activity: Sufficiently Active (11/09/2021)   Exercise Vital Sign    Days of Exercise per Week: 6 days    Minutes of Exercise per Session: 30 min  Stress: No Stress Concern Present (11/09/2021)   Sawmill    Feeling of Stress  : Not at all  Social Connections: [place] (11/09/2021)   Social Connection and Isolation Panel [NHANES]    Frequency of Communication with Friends and Family: More than three times a week    Frequency of Social Gatherings with Friends and Family: More than three times a week    Attends Religious Services: More than 4 times per year    Active Member of [employer] or Organizations: Yes    Attends Music therapist: More than 4 times per year    Marital Status: Married  Human resources officer Violence: Not At Risk (11/09/2021)   Humiliation, Afraid, Rape, and Kick questionnaire    Fear of Current or Ex-Partner: No    Emotionally Abused: No    Physically Abused: No    Sexually Abused: No    Review of Systems: All systems reviewed and negative except where noted in HPI.  Physical Exam: Vital signs in last 24 hours: Temp:  [97.7 F (36.5 C)-98.2 F (36.8 C)] 98.2 F (36.8 C) (09/26 1534) Pulse Rate:  [57-72] 58 (09/26 1530) Resp:  [14-24] 24 (09/26 1530) BP: (135-174)/(63-76) 165/74 (09/26 1530) SpO2:  [93 %-100 %] 98 % (09/26 1530) Weight:  [84.4 kg] 84.4 kg (09/26 0959)    General:  Alert male in NAD Psych:  Pleasant, cooperative. Normal mood and affect Eyes: Pupils equal, no icterus. Conjunctive pink Ears:  Normal auditory acuity Nose: No deformity, discharge or lesions Neck:  Supple, no masses felt Lungs:  Clear to auscultation.  Heart:  Regular rate, regular rhythm. No lower extremity edema Abdomen:  Soft, distended, tympanitic, nontender, active bowel sounds.  Rectal :  Deferred Msk: Symmetrical without gross deformities.  Neurologic:  Alert, oriented, grossly normal neurologically Skin:  Intact without significant lesions.    Intake/Output from previous day: No intake/output data recorded. Intake/Output this shift:  Total I/O In: 250 [IV  Piggyback:250] Out: -     Principal Problem:   Colonic mass    Tye Savoy, NP-C @  03/23/2022, 4:38  PM

## 2022-03-23 NOTE — ED Notes (Signed)
Pt is here for constipation.  He has been in touch with his MD and was advised to come here after he failed to be able to have a BM after taking Linzess po.  Pt reports that he took the following: Sunday-Miralax and a glycerine suppository Monday- milalax at breakfast and after lunch                Tea manasul- 2 tea bags                Linzess 2-58mg capsules Rectal exam by HNira Connto evaluate for impaction (which was negative) Dr. MRush Landmarksaid that if he did not have a BM after Linzess he should come to the ED  Pt is having LLQ abdominal pain and is tender to palpation, pt has ventral hernia and hx of prostate surgery for prostate CA (decades ago)

## 2022-03-23 NOTE — H&P (Addendum)
History and Physical    [name] WVP:[id number] DOB: [birth year] DOA: 03/23/2022  PCP: Binnie Rail, MD  Patient coming from: [facility]   I have personally briefly reviewed patient's old medical records in [place]  Chief Complaint: obstipation with abdominal pain   HPI: [name] is a 88 y.o. male with medical history significant of  chronic constipation, diverticulosis, hiatal hernia, hypothyroidism, OSA, HTN, HLD, glaucoma,anxiety OSA, hx of PUD, prostate CA s/p prostatectomy, CRI, Anemia nos , hx of difficult colonoscopy due to segment of stenosis of left colon.  Patient follow with gi for chronic constipation and around 3 weeks ago started to experience increase symptoms. He followed up with gi and employed suppositories, increasing Linzess however symptoms did not improve due persistent symptoms her presented to ED for further evaluation of abdominal pain and obstipation. Patient notes no fever/ chills/ n/v/d/sob/chest pain. He notes abdominal pain intermittently , however currently he states he has no abdominal pain.   ED Course:  afeb, bp 135/71, hr 63, sat 99%  Labs Wbc 15, hgb 13.2 at baseline, plt 356, Na 140, K 4.4, cr 2.7 ( 2.3-2.6) UA neg ,+ protein  Ctabd/pelvis IMPRESSION: There appears to be at least severe obstruction or near occlusion in the sigmoid colon with associated severe wall thickening, concerning for colitis or possibly colonic malignancy. Large amount of stool seen in the more proximal colon. There may be some component of diverticulitis as well. Sigmoidoscopy is recommended for further evaluation.   Moderate size sliding-type hiatal hernia.   4 mm nodule noted in right lower lobe. Although likely benign, if the patient is high-risk, given the morphology and/or location of this nodule a non-contrast chest CT can be considered in 12 months Review of Systems: As per HPI otherwise 10 point review of systems negative.   Past Medical  History:  Diagnosis Date   Anxiety    BRADYCARDIA 11/13/2008   Cataract    Cholelithiasis    COLONIC POLYPS, HX OF 02/04/2007   DEPRESSION 02/04/2007   DIABETES MELLITUS, TYPE II 02/04/2007   DIVERTICULOSIS, COLON 02/04/2007   FOREIGN BODY, ASPIRATION 12/04/2007   GERD (gastroesophageal reflux disease)    GLAUCOMA 03/08/2009   Hepatitis A 1963   HYPERLIPIDEMIA 12/04/2007   HYPERTENSION 02/04/2007   Memory loss    OBSTRUCTIVE SLEEP APNEA 11/05/2009   -PSG 01/05/10 RDI 11, PLMI 56   OSTEOARTHRITIS 02/04/2007   Peptic stricture of esophagus    PROSTATE CANCER 1997   RENAL INSUFFICIENCY 11/13/2008   SYNCOPE 12/31/2009   THROMBOCYTOPENIA 04/17/2008   THYROID NODULE, RIGHT 11/13/2008   UNSPECIFIED ANEMIA 11/07/2007    Past Surgical History:  Procedure Laterality Date   Heart Cartherization  2000   PROSTATECTOMY  1997   TONSILLECTOMY  1940's     reports that he quit smoking about 28 years ago. His smoking use included cigarettes and pipe. He has never used smokeless tobacco. He reports current alcohol use. He reports that he does not use drugs.  Allergies  Allergen Reactions   Sulfa Antibiotics     Other reaction(s): RASH,PAPULAR   Sulfonamide Derivatives     REACTION: Itch/Red Spots    Family History  Problem Relation Age of Onset   Kidney disease Father        Bright's Disease - died at age 37   Other Mother        no signifincant health problems - died at age 47   Heart disease Neg Hx  No FH of Coronary Artery Disease   Cancer Neg Hx    Colon cancer Neg Hx    Esophageal cancer Neg Hx    Inflammatory bowel disease Neg Hx    Liver disease Neg Hx    Pancreatic cancer Neg Hx    Rectal cancer Neg Hx    Stomach cancer Neg Hx     Prior to Admission medications   Medication Sig Start Date End Date Taking? Authorizing Provider  amLODipine (NORVASC) 5 MG tablet Take 1 tablet (5 mg total) by mouth daily. 10/01/21  Yes Sherren Mocha, MD  Cholecalciferol (VITAMIN D) 2000 UNITS CAPS  Take 2 capsules by mouth daily.   Yes [provider]  escitalopram (LEXAPRO) 5 MG tablet Take 1 tablet (5 mg total) by mouth daily. 10/15/21  Yes Burns, Claudina Lick, MD  ferrous sulfate 325 (65 FE) MG EC tablet Take 1 tablet (325 mg total) by mouth every other day. 11/14/19  Yes Reed, Tiffany L, DO  furosemide (LASIX) 40 MG tablet Take 1 tablet by mouth as needed for fluid retention (once weekly on Sundays). 03/17/22  Yes Swinyer, Lanice Schwab, NP  levothyroxine (SYNTHROID) 50 MCG tablet Take 1 tablet (50 mcg total) by mouth daily. 10/18/21  Yes Burns, Claudina Lick, MD  polycarbophil (FIBERCON) 625 MG tablet Take 625 mg by mouth daily.   Yes [provider]  polyethylene glycol (MIRALAX / GLYCOLAX) 17 g packet Take 17 g by mouth daily. Take 1 to 2 capfuls daily at noon   Yes [provider]  rosuvastatin (CRESTOR) 40 MG tablet TAKE ONE TABLET BY MOUTH EVERY DAY 12/30/21  Yes Binnie Rail, MD  traZODone (DESYREL) 100 MG tablet TAKE ONE TABLET AT BEDTIME. 05/20/21  Yes Binnie Rail, MD    Physical Exam: Vitals:   03/23/22 1400 03/23/22 1530 03/23/22 1534 03/23/22 1656  BP: (!) 171/63 (!) 165/74  (!) 178/68  Pulse: 60 (!) 58  60  Resp: 16 (!) 24  16  Temp:   98.2 F (36.8 C) (!) 97.5 F (36.4 C)  TempSrc:   Oral Oral  SpO2: 97% 98%  100%  Weight:      Height:         Vitals:   03/23/22 1400 03/23/22 1530 03/23/22 1534 03/23/22 1656  BP: (!) 171/63 (!) 165/74  (!) 178/68  Pulse: 60 (!) 58  60  Resp: 16 (!) 24  16  Temp:   98.2 F (36.8 C) (!) 97.5 F (36.4 C)  TempSrc:   Oral Oral  SpO2: 97% 98%  100%  Weight:      Height:      Constitutional: NAD, calm, comfortable Eyes: PERRL, lids and conjunctivae normal ENMT: Mucous membranes are moist. Posterior pharynx clear of any exudate or lesions.Normal dentition.  Neck: normal, supple, no masses, no thyromegaly Respiratory: clear to auscultation bilaterally, no wheezing, no crackles. Normal respiratory effort. No  accessory muscle use.  Cardiovascular: Regular rate and rhythm, no murmurs / rubs / gallops. No extremity edema. 2+ pedal pulses. No carotid bruits.  Abdomen:  lower quad tenderness, no masses palpated. No hepatosplenomegaly. Bowel sounds positive.  Musculoskeletal: no clubbing / cyanosis. No joint deformity upper and lower extremities. Good ROM, no contractures. Normal muscle tone.  Skin: no rashes, lesions, ulcers. No induration Neurologic: CN 2-12 grossly intact. Sensation intact, . Strength 5/5 in all 4.  Psychiatric: Normal judgment and insight. Alert and oriented x 3. Normal mood.    Labs on Admission:  I have personally reviewed following labs and imaging studies  CBC: Recent Labs  Lab 03/23/22 1059  WBC 15.0*  NEUTROABS 8.2*  HGB 13.2  HCT 40.6  MCV 95.8  PLT 017   Basic Metabolic Panel: Recent Labs  Lab 03/17/22 1110 03/23/22 1059  NA 145* 140  K 5.1 4.4  CL 105 105  CO2 24 24  GLUCOSE 152* 119*  BUN 47* 52*  CREATININE 2.65* 2.71*  CALCIUM 9.2 9.3   GFR: Estimated Creatinine Clearance: 18.5 mL/min (A) (by C-G formula based on SCr of 2.71 mg/dL (H)). Liver Function Tests: Recent Labs  Lab 03/23/22 1059  AST 18  ALT 10  ALKPHOS 77  BILITOT 0.6  PROT 7.0  ALBUMIN 4.3   No results for input(s): "LIPASE", "AMYLASE" in the last 168 hours. No results for input(s): "AMMONIA" in the last 168 hours. Coagulation Profile: No results for input(s): "INR", "PROTIME" in the last 168 hours. Cardiac Enzymes: No results for input(s): "CKTOTAL", "CKMB", "CKMBINDEX", "TROPONINI" in the last 168 hours. BNP (last 3 results) No results for input(s): "PROBNP" in the last 8760 hours. HbA1C: No results for input(s): "HGBA1C" in the last 72 hours. CBG: No results for input(s): "GLUCAP" in the last 168 hours. Lipid Profile: No results for input(s): "CHOL", "HDL", "LDLCALC", "TRIG", "CHOLHDL", "LDLDIRECT" in the last 72 hours. Thyroid Function Tests: No results for  input(s): "TSH", "T4TOTAL", "FREET4", "T3FREE", "THYROIDAB" in the last 72 hours. Anemia Panel: No results for input(s): "VITAMINB12", "FOLATE", "FERRITIN", "TIBC", "IRON", "RETICCTPCT" in the last 72 hours. Urine analysis:    Component Value Date/Time   COLORURINE YELLOW 03/23/2022 1059   APPEARANCEUR CLEAR 03/23/2022 1059   LABSPEC 1.009 03/23/2022 1059   PHURINE 5.5 03/23/2022 1059   GLUCOSEU NEGATIVE 03/23/2022 1059   GLUCOSEU NEGATIVE 02/25/2017 0941   HGBUR TRACE (A) 03/23/2022 1059   BILIRUBINUR NEGATIVE 03/23/2022 1059   KETONESUR NEGATIVE 03/23/2022 1059   PROTEINUR >300 (A) 03/23/2022 1059   UROBILINOGEN 0.2 02/25/2017 0941   NITRITE NEGATIVE 03/23/2022 1059   LEUKOCYTESUR NEGATIVE 03/23/2022 1059    Radiological Exams on Admission: CT ABDOMEN PELVIS WO CONTRAST  Result Date: 03/23/2022 CLINICAL DATA:  Acute left lower quadrant abdominal pain. EXAM: CT ABDOMEN AND PELVIS WITHOUT CONTRAST TECHNIQUE: Multidetector CT imaging of the abdomen and pelvis was performed following the standard protocol without IV contrast. RADIATION DOSE REDUCTION: This exam was performed according to the departmental dose-optimization program which includes automated exposure control, adjustment of the mA and/or kV according to patient size and/or use of iterative reconstruction technique. COMPARISON:  None Available. FINDINGS: Lower chest: Moderate size sliding-type hiatal hernia is noted. 4 mm nodule is noted posteriorly in the right lower lobe. Hepatobiliary: No focal liver abnormality is seen. No gallstones, gallbladder wall thickening, or biliary dilatation. Pancreas: Unremarkable. No pancreatic ductal dilatation or surrounding inflammatory changes. Spleen: Normal in size without focal abnormality. Adrenals/Urinary Tract: Adrenal glands are unremarkable. Kidneys are normal, without renal calculi, focal lesion, or hydronephrosis. Bladder is unremarkable. Stomach/Bowel: Moderate size sliding-type hiatal  hernia is noted above. No small bowel dilatation or inflammation is noted. Large amount of stool seen throughout the colon which extends to the sigmoid colon where there is severe wall thickening and severe obstruction. Stool is seen in the more distal sigmoid colon and rectum. Diverticulosis is noted in this area. This is highly concerning for malignancy, although some component of colitis or diverticulitis may be present as well. Vascular/Lymphatic: Aortic atherosclerosis. No enlarged abdominal or pelvic lymph nodes. Reproductive:  Status post prostatectomy. Other: No abdominal wall hernia or abnormality. No abdominopelvic ascites. Musculoskeletal: No acute or significant osseous findings. IMPRESSION: There appears to be at least severe obstruction or near occlusion in the sigmoid colon with associated severe wall thickening, concerning for colitis or possibly colonic malignancy. Large amount of stool seen in the more proximal colon. There may be some component of diverticulitis as well. Sigmoidoscopy is recommended for further evaluation. Moderate size sliding-type hiatal hernia. 4 mm nodule noted in right lower lobe. Although likely benign, if the patient is high-risk, given the morphology and/or location of this nodule a non-contrast chest CT can be considered in 12 months.This recommendation follows the consensus statement: Guidelines for Management of Incidental Pulmonary Nodules Detected on CT Images: From the Fleischner Society 2017; Radiology 2017; [id number]. Aortic Atherosclerosis (ICD10-I70.0). Electronically Signed   By: Marijo Conception M.D.   On: 03/23/2022 12:01    EKG: Independently reviewed.   Assessment/Plan  Colon obstruction r/o inflammation/nfection vs colonic malignancy  -Severe obstruction or near occlusion in the sigmoid colon with associated severe wall thickening, concerning for colitis or possibly colonic malignancy -per gi most likely obstruction caused by inflammation related  to diverticulitis  -will continue unasyn  -plan for c scope if patient does note improve  -f/u final gi recs  -npo, supportive care  ivfs   Obstipation related to above obstruction  -per gi gently bowel regimen with miralax and suppositories  RLL nodule  -will need f/u as outpatient with repeat scan   CRI -mild dehydration  -ivfs -monitor labs     Anemia nos  Hypothyroidism -resume synthroid  OSA -monitor qhs pulse ox    HTN - continue amlodipine   HLD -hold statin resume once tolerating full meal      Anxiety  -not currently on medication   DVT prophylaxis: heparin Code Status:full Family Communication: wife at bedside Disposition Plan: patient  expected to be admitted greater than 2 midnights  Consults called: GI Dr Delane Ginger  Admission status: inpatient med tele   Clance Boll MD [facility]   If 7PM-7AM, please contact night-coverage [email or web address] Password [facility]  03/23/2022, 5:24 PM

## 2022-03-23 NOTE — ED Notes (Signed)
Patient transported to CT 

## 2022-03-23 NOTE — ED Triage Notes (Signed)
Pt arrived POV, ambulatory. Pt c/o constipation x5 days. Pt reports PCP recommended him to try suppositories and laxatives but has still been unable to have a BM.  Last BM 9/21. No pain at present.

## 2022-03-24 DIAGNOSIS — I10 Essential (primary) hypertension: Secondary | ICD-10-CM

## 2022-03-24 DIAGNOSIS — E039 Hypothyroidism, unspecified: Secondary | ICD-10-CM

## 2022-03-24 DIAGNOSIS — N1832 Chronic kidney disease, stage 3b: Secondary | ICD-10-CM | POA: Diagnosis not present

## 2022-03-24 DIAGNOSIS — K5732 Diverticulitis of large intestine without perforation or abscess without bleeding: Secondary | ICD-10-CM

## 2022-03-24 DIAGNOSIS — E785 Hyperlipidemia, unspecified: Secondary | ICD-10-CM

## 2022-03-24 DIAGNOSIS — R933 Abnormal findings on diagnostic imaging of other parts of digestive tract: Secondary | ICD-10-CM

## 2022-03-24 DIAGNOSIS — K5909 Other constipation: Secondary | ICD-10-CM | POA: Diagnosis not present

## 2022-03-24 LAB — COMPREHENSIVE METABOLIC PANEL
ALT: 12 U/L (ref 0–44)
AST: 20 U/L (ref 15–41)
Albumin: 3.2 g/dL — ABNORMAL LOW (ref 3.5–5.0)
Alkaline Phosphatase: 69 U/L (ref 38–126)
Anion gap: 6 (ref 5–15)
BUN: 40 mg/dL — ABNORMAL HIGH (ref 8–23)
CO2: 24 mmol/L (ref 22–32)
Calcium: 8.5 mg/dL — ABNORMAL LOW (ref 8.9–10.3)
Chloride: 111 mmol/L (ref 98–111)
Creatinine, Ser: 2.25 mg/dL — ABNORMAL HIGH (ref 0.61–1.24)
GFR, Estimated: 27 mL/min — ABNORMAL LOW (ref >60)
Glucose, Bld: 102 mg/dL — ABNORMAL HIGH (ref 70–99)
Potassium: 3.9 mmol/L (ref 3.5–5.1)
Sodium: 141 mmol/L (ref 135–145)
Total Bilirubin: 0.6 mg/dL (ref 0.3–1.2)
Total Protein: 6 g/dL — ABNORMAL LOW (ref 6.5–8.1)

## 2022-03-24 LAB — CBC
HCT: 38.7 % — ABNORMAL LOW (ref 39.0–52.0)
Hemoglobin: 12.5 g/dL — ABNORMAL LOW (ref 13.0–17.0)
MCH: 31.6 pg (ref 26.0–34.0)
MCHC: 32.3 g/dL (ref 30.0–36.0)
MCV: 97.7 fL (ref 80.0–100.0)
Platelets: 307 10*3/uL (ref 150–400)
RBC: 3.96 MIL/uL — ABNORMAL LOW (ref 4.22–5.81)
RDW: 14.3 % (ref 11.5–15.5)
WBC: 14.2 10*3/uL — ABNORMAL HIGH (ref 4.0–10.5)
nRBC: 0 % (ref 0.0–0.2)

## 2022-03-24 MED ORDER — AMLODIPINE BESYLATE 10 MG PO TABS
10.0000 mg | ORAL_TABLET | Freq: Every day | ORAL | Status: DC
  Administered 2022-03-25 – 2022-03-27 (×3): 10 mg via ORAL
  Filled 2022-03-24 (×3): qty 1

## 2022-03-24 MED ORDER — AMLODIPINE BESYLATE 5 MG PO TABS
5.0000 mg | ORAL_TABLET | Freq: Once | ORAL | Status: AC
  Administered 2022-03-24: 5 mg via ORAL
  Filled 2022-03-24: qty 1

## 2022-03-24 MED ORDER — SODIUM CHLORIDE 0.9 % IV SOLN: INTRAVENOUS | Status: DC

## 2022-03-24 MED ORDER — HYDRALAZINE HCL 20 MG/ML IJ SOLN: 5.0000 mg | Freq: Four times a day (QID) | INTRAMUSCULAR | Status: DC | PRN

## 2022-03-24 NOTE — Progress Notes (Signed)
PROGRESS NOTE    [name]  YQM:[id number] DOB: 1927-11-04 DOA: 03/23/2022 PCP: Binnie Rail, MD (Confirm with patient/family/NH records and if not entered, this HAS to be entered at [facility] point of entry. "No PCP" if truly none.)   Chief Complaint  Patient presents with   Constipation    Brief Narrative:  Patient is a pleasant 88 year old gentleman history of chronic constipation, diverticulosis, history of hernia, hypothyroidism, OSA, hypertension, hyperlipidemia, glaucoma, anxiety, OSA, history of PUD, prostate cancer status post prostatectomy, chronic renal insufficiency, anemia, history of difficult colonoscopy due to segment of stenosis of the left colon who follows GI chronically for chronic constipation and 3 weeks prior to admission started to experience worsening symptoms.  Patient was seen by GI, suppositories recommended with increased Linzess however symptoms did not improve and due to persistent symptoms presented to the ED.  CT abdomen and pelvis done concerning for severe obstruction or near occlusion of the sigmoid colon with associated severe wall thickening concerning for colitis or possible colonic malignancy, large amount of stool seen in the more proximal colon, component of diverticulitis noted as well sigmoidoscopy recommended.  GI consulted who assessed the patient felt patient had an acute diverticulitis, recommended IV antibiotics, bowel regimen.   Assessment & Plan:   Principal Problem:   Abnormal CT scan, sigmoid colon Active Problems:   Chronic constipation   Diverticulitis of colon   Dyslipidemia   HTN (hypertension)   Hypothyroidism   Chronic kidney disease (CKD), stage III (moderate) (HCC)   Colitis   Bloating   Abdominal distention  #1 chronic constipation/CT findings of severe obstruction near occlusion in the sigmoid colon with associated severe wall thickening/large amount of stool more proximal colon possible component of  diverticulitis -Patient seen in consultation by GI who felt unlikely malignancy given the long segment of narrowing and noted to have had severe left colon stenosis on colonoscopy in 2019. -Per GI few patient may have a component of acute diverticulitis in addition to constipation. -Patient with bowel movements yesterday, clinical improvement on IV antibiotics. -Patient currently afebrile, leukocytosis. -Patient tolerating clear liquids. -Continue empiric IV Unasyn, MiraLAX twice daily per GI recommendations. -Diet has been advanced to full liquid diet. -Per GI patient with continued improvement could likely hold off on lower endoscopy at this time with outpatient follow-up. -Supportive care.  2.  4 mm right lower lobe lung nodule on CT scan -Outpatient follow-up.  3.  Hypothyroidism -Continue home regimen Synthroid.  4.  Hypertension -Increase home regimen Norvasc to 10 mg daily. -IV hydralazine as needed.  5.  CKD stage IIIb -Continue gentle hydration. -Follow.  6.  Anemia -Follow H&H.  7.  Hyperlipidemia -Continue to hold statin, resume on discharge.  8.  Depression -Continue home regimen Lexapro.   DVT prophylaxis: Heparin Code Status: Full Family Communication: Updated patient, wife, daughter at bedside. Disposition: Likely home when clinically improved and cleared by gastroenterology.  Status is: Inpatient Remains inpatient appropriate because: Severity of illness   Consultants:  Gastroenterology: Dr. Rush Landmark 03/23/2022  Procedures:  CT abdomen and pelvis 03/23/2022  Antimicrobials:  IV Unasyn 03/23/2022>>>>>   Subjective: Sitting up in bed.  Wife and daughter at bedside.  Denies any chest pain.  No shortness of breath.  Stated had bowel movement last night.  States abdominal pain improving currently now intermittent currently in no acute abdominal pain.  Wife at bedside and concerned patient is in pain.  Tolerating clear liquids.  Objective: Vitals:    03/24/22 0200  03/24/22 0417 03/24/22 0421 03/24/22 0518  BP:    (!) 162/75  Pulse:    (!) 59  Resp:    17  Temp:    97.8 F (36.6 C)  TempSrc:    Oral  SpO2: 95% (!) 85% 95% 93%  Weight:      Height:        Intake/Output Summary (Last 24 hours) at 03/24/2022 1829 Last data filed at 03/24/2022 1317 Gross per 24 hour  Intake 1015 ml  Output 800 ml  Net 215 ml   Filed Weights   03/23/22 0959  Weight: 84.4 kg    Examination:  General exam: Appears calm and comfortable  Respiratory system: Clear to auscultation. Respiratory effort normal. Cardiovascular system: S1 & S2 heard, RRR. No JVD, murmurs, rubs, gallops or clicks. No pedal edema. Gastrointestinal system: Abdomen is mildly distended, soft, nontender to palpation, hyperactive bowel sounds.  Nondistended, soft and nontender. No organomegaly or masses felt. Central nervous system: Alert and oriented. No focal neurological deficits. Extremities: Symmetric 5 x 5 power. Skin: No rashes, lesions or ulcers Psychiatry: Judgement and insight appear normal. Mood & affect appropriate.     Data Reviewed: I have personally reviewed following labs and imaging studies  CBC: Recent Labs  Lab 03/23/22 1059 03/24/22 0554  WBC 15.0* 14.2*  NEUTROABS 8.2*  --   HGB 13.2 12.5*  HCT 40.6 38.7*  MCV 95.8 97.7  PLT 356 993    Basic Metabolic Panel: Recent Labs  Lab 03/23/22 1059 03/24/22 0554  NA 140 141  K 4.4 3.9  CL 105 111  CO2 24 24  GLUCOSE 119* 102*  BUN 52* 40*  CREATININE 2.71* 2.25*  CALCIUM 9.3 8.5*    GFR: Estimated Creatinine Clearance: 22.3 mL/min (A) (by C-G formula based on SCr of 2.25 mg/dL (H)).  Liver Function Tests: Recent Labs  Lab 03/23/22 1059 03/24/22 0554  AST 18 20  ALT 10 12  ALKPHOS 77 69  BILITOT 0.6 0.6  PROT 7.0 6.0*  ALBUMIN 4.3 3.2*    CBG: No results for input(s): "GLUCAP" in the last 168 hours.   No results found for this or any previous visit (from the past 240  hour(s)).       Radiology Studies: CT ABDOMEN PELVIS WO CONTRAST  Result Date: 03/23/2022 CLINICAL DATA:  Acute left lower quadrant abdominal pain. EXAM: CT ABDOMEN AND PELVIS WITHOUT CONTRAST TECHNIQUE: Multidetector CT imaging of the abdomen and pelvis was performed following the standard protocol without IV contrast. RADIATION DOSE REDUCTION: This exam was performed according to the departmental dose-optimization program which includes automated exposure control, adjustment of the mA and/or kV according to patient size and/or use of iterative reconstruction technique. COMPARISON:  None Available. FINDINGS: Lower chest: Moderate size sliding-type hiatal hernia is noted. 4 mm nodule is noted posteriorly in the right lower lobe. Hepatobiliary: No focal liver abnormality is seen. No gallstones, gallbladder wall thickening, or biliary dilatation. Pancreas: Unremarkable. No pancreatic ductal dilatation or surrounding inflammatory changes. Spleen: Normal in size without focal abnormality. Adrenals/Urinary Tract: Adrenal glands are unremarkable. Kidneys are normal, without renal calculi, focal lesion, or hydronephrosis. Bladder is unremarkable. Stomach/Bowel: Moderate size sliding-type hiatal hernia is noted above. No small bowel dilatation or inflammation is noted. Large amount of stool seen throughout the colon which extends to the sigmoid colon where there is severe wall thickening and severe obstruction. Stool is seen in the more distal sigmoid colon and rectum. Diverticulosis is noted in this area. This  is highly concerning for malignancy, although some component of colitis or diverticulitis may be present as well. Vascular/Lymphatic: Aortic atherosclerosis. No enlarged abdominal or pelvic lymph nodes. Reproductive: Status post prostatectomy. Other: No abdominal wall hernia or abnormality. No abdominopelvic ascites. Musculoskeletal: No acute or significant osseous findings. IMPRESSION: There appears to be  at least severe obstruction or near occlusion in the sigmoid colon with associated severe wall thickening, concerning for colitis or possibly colonic malignancy. Large amount of stool seen in the more proximal colon. There may be some component of diverticulitis as well. Sigmoidoscopy is recommended for further evaluation. Moderate size sliding-type hiatal hernia. 4 mm nodule noted in right lower lobe. Although likely benign, if the patient is high-risk, given the morphology and/or location of this nodule a non-contrast chest CT can be considered in 12 months.This recommendation follows the consensus statement: Guidelines for Management of Incidental Pulmonary Nodules Detected on CT Images: From the Fleischner Society 2017; Radiology 2017; 284:228-243. Aortic Atherosclerosis (ICD10-I70.0). Electronically Signed   By: Marijo Conception M.D.   On: 03/23/2022 12:01        Scheduled Meds:  [START ON 03/25/2022] amLODipine  10 mg Oral Daily   escitalopram  5 mg Oral Daily   Glycerin (Adult)  1 suppository Rectal Once   heparin  5,000 Units Subcutaneous Q8H   levothyroxine  50 mcg Oral QAC breakfast   polyethylene glycol  17 g Oral BID   traZODone  100 mg Oral QHS   Continuous Infusions:  sodium chloride     ampicillin-sulbactam (UNASYN) IV 3 g (03/24/22 1317)     LOS: 1 day    Time spent: 40 minutes    Irine Seal, MD [facility]   To contact the attending provider between 7A-7P or the covering provider during after hours 7P-7A, please log into the web site [email or web address] and access using universal [place] password for that web site. If you do not have the password, please call the hospital operator.  03/24/2022, 6:29 PM

## 2022-03-24 NOTE — Consult Note (Deleted)
Daily Progress Note  Hospital Day: 2  Chief Complaint: abnormal colon on CT scan   Brief History 88 yo male with a pmh of constipation, diverticulosis, hiatal hernia, hypothyroidism, OSA, HTN, HLD, CKD 3, glaucoma.  Admitted 9/26 for abdominal pain and colonic obstruction / ? Diverticulitis.    We saw him in consultation 9/26  Assessment / Plan   THIS note was labeled as a consult but it is a progress note. I spoke with epic and for whatever reason the note type can not be changed.   # 88 yo male with chronic constipation / CT scan showing severe obstruction or near occlusion in the sigmoid colon with associated severe wall thickening.  Large amount of stool in the more  proximal colon and possibly a component of diverticulitis. Seems unlikely to be malignancy given the long segment of narrowing. Additionally he had this severe left colon stenosis on colonoscopy in 2019.  Afebrile. Labs reviewed. WBC about the same at 14.2. Hgb stable at 12.5 Feels better and having BMs now. Perhaps he is opening up with antibiotics. Continue BID miralax.  Continue Unasyn Advance to full liquids today.  If continues to improve then we can probably hold off on lower endoscopy.    # 4 mm RLL lung nodule on CT scan  Subjective   Feels much better today. Had two bowel movement last night with suppositories and miralax   Objective   Imaging:  CT ABDOMEN PELVIS WO CONTRAST  Result Date: 03/23/2022 CLINICAL DATA:  Acute left lower quadrant abdominal pain. EXAM: CT ABDOMEN AND PELVIS WITHOUT CONTRAST TECHNIQUE: Multidetector CT imaging of the abdomen and pelvis was performed following the standard protocol without IV contrast. RADIATION DOSE REDUCTION: This exam was performed according to the departmental dose-optimization program which includes automated exposure control, adjustment of the mA and/or kV according to patient size and/or use of iterative reconstruction technique. COMPARISON:  None  Available. FINDINGS: Lower chest: Moderate size sliding-type hiatal hernia is noted. 4 mm nodule is noted posteriorly in the right lower lobe. Hepatobiliary: No focal liver abnormality is seen. No gallstones, gallbladder wall thickening, or biliary dilatation. Pancreas: Unremarkable. No pancreatic ductal dilatation or surrounding inflammatory changes. Spleen: Normal in size without focal abnormality. Adrenals/Urinary Tract: Adrenal glands are unremarkable. Kidneys are normal, without renal calculi, focal lesion, or hydronephrosis. Bladder is unremarkable. Stomach/Bowel: Moderate size sliding-type hiatal hernia is noted above. No small bowel dilatation or inflammation is noted. Large amount of stool seen throughout the colon which extends to the sigmoid colon where there is severe wall thickening and severe obstruction. Stool is seen in the more distal sigmoid colon and rectum. Diverticulosis is noted in this area. This is highly concerning for malignancy, although some component of colitis or diverticulitis may be present as well. Vascular/Lymphatic: Aortic atherosclerosis. No enlarged abdominal or pelvic lymph nodes. Reproductive: Status post prostatectomy. Other: No abdominal wall hernia or abnormality. No abdominopelvic ascites. Musculoskeletal: No acute or significant osseous findings. IMPRESSION: There appears to be at least severe obstruction or near occlusion in the sigmoid colon with associated severe wall thickening, concerning for colitis or possibly colonic malignancy. Large amount of stool seen in the more proximal colon. There may be some component of diverticulitis as well. Sigmoidoscopy is recommended for further evaluation. Moderate size sliding-type hiatal hernia. 4 mm nodule noted in right lower lobe. Although likely benign, if the patient is high-risk, given the morphology and/or location of this nodule a non-contrast chest CT can be considered  in 12 months.This recommendation follows the  consensus statement: Guidelines for Management of Incidental Pulmonary Nodules Detected on CT Images: From the Fleischner Society 2017; Radiology 2017; 284:228-243. Aortic Atherosclerosis (ICD10-I70.0). Electronically Signed   By: Marijo Conception M.D.   On: 03/23/2022 12:01    Lab Results: Recent Labs    03/23/22 1059 03/24/22 0554  WBC 15.0* 14.2*  HGB 13.2 12.5*  HCT 40.6 38.7*  PLT 356 307   BMET Recent Labs    03/23/22 1059 03/24/22 0554  NA 140 141  K 4.4 3.9  CL 105 111  CO2 24 24  GLUCOSE 119* 102*  BUN 52* 40*  CREATININE 2.71* 2.25*  CALCIUM 9.3 8.5*   LFT Recent Labs    03/24/22 0554  PROT 6.0*  ALBUMIN 3.2*  AST 20  ALT 12  ALKPHOS 69  BILITOT 0.6   PT/INR No results for input(s): "LABPROT", "INR" in the last 72 hours.   Scheduled inpatient medications:   [START ON 03/25/2022] amLODipine  10 mg Oral Daily   escitalopram  5 mg Oral Daily   Glycerin (Adult)  1 suppository Rectal Once   heparin  5,000 Units Subcutaneous Q8H   levothyroxine  50 mcg Oral QAC breakfast   polyethylene glycol  17 g Oral BID   traZODone  100 mg Oral QHS   Continuous inpatient infusions:   sodium chloride     ampicillin-sulbactam (UNASYN) IV 3 g (03/24/22 0009)   PRN inpatient medications: acetaminophen **OR** acetaminophen, hydrALAZINE, ondansetron **OR** ondansetron (ZOFRAN) IV  Vital signs in last 24 hours: Temp:  [97.5 F (36.4 C)-98.2 F (36.8 C)] 97.8 F (36.6 C) (09/27 0518) Pulse Rate:  [57-72] 59 (09/27 0518) Resp:  [14-24] 17 (09/27 0518) BP: (159-178)/(63-76) 162/75 (09/27 0518) SpO2:  [85 %-100 %] 93 % (09/27 0518) Last BM Date : 03/23/22  Intake/Output Summary (Last 24 hours) at 03/24/2022 1048 Last data filed at 03/24/2022 0715 Gross per 24 hour  Intake 1475 ml  Output 550 ml  Net 925 ml    Intake/Output from previous day: 09/26 0701 - 09/27 0700 In: 2426 [P.O.:240; I.V.:785; IV Piggyback:450] Out: 250 [Urine:250] Intake/Output this  shift: Total I/O In: -  Out: 300 [Urine:300]   Physical Exam:  General: Alert male in NAD. Daughter visiting Heart:  Regular rate and rhythm. No lower extremity edema Pulmonary: Normal respiratory effort Abdomen: Soft, moderately distended, nontender. Normal bowel sounds.  Neurologic: Alert and oriented Psych: Pleasant. Cooperative.    Principal Problem:   Colonic mass Active Problems:   Colitis   Abnormal CT scan, sigmoid colon   Chronic constipation   Bloating   Abdominal distention     LOS: 1 day   Tye Savoy ,NP 03/24/2022, 10:48 AM

## 2022-03-24 NOTE — Progress Notes (Signed)
Daily Progress Note  Hospital Day: 2  Chief Complaint: abnormal colon on CT scan   Brief History 88 yo male with a pmh of constipation, diverticulosis, hiatal hernia, hypothyroidism, OSA, HTN, HLD, CKD 3, glaucoma.  Admitted 9/26 for abdominal pain and colonic obstruction / ? Diverticulitis.    We saw him in consultation 9/26  Assessment / Plan   THIS note was labeled as a consult but it is a progress note. I spoke with epic and for whatever reason the note type can not be changed.   # 88 yo male with chronic constipation / CT scan showing severe obstruction or near occlusion in the sigmoid colon with associated severe wall thickening.  Large amount of stool in the more  proximal colon and possibly a component of diverticulitis. Seems unlikely to be malignancy given the long segment of narrowing. Additionally he had this severe left colon stenosis on colonoscopy in 2019.  Afebrile. Labs reviewed. WBC about the same at 14.2. Hgb stable at 12.5 Feels better and having BMs now. Perhaps he is opening up with antibiotics. Continue BID miralax.  Continue Unasyn Advance to full liquids today.  If continues to improve then we can probably hold off on lower endoscopy.    # 4 mm RLL lung nodule on CT scan  Subjective   Feels much better today. Had two bowel movement last night with suppositories and miralax   Objective   Imaging:  CT ABDOMEN PELVIS WO CONTRAST  Result Date: 03/23/2022 CLINICAL DATA:  Acute left lower quadrant abdominal pain. EXAM: CT ABDOMEN AND PELVIS WITHOUT CONTRAST TECHNIQUE: Multidetector CT imaging of the abdomen and pelvis was performed following the standard protocol without IV contrast. RADIATION DOSE REDUCTION: This exam was performed according to the departmental dose-optimization program which includes automated exposure control, adjustment of the mA and/or kV according to patient size and/or use of iterative reconstruction technique. COMPARISON:  None  Available. FINDINGS: Lower chest: Moderate size sliding-type hiatal hernia is noted. 4 mm nodule is noted posteriorly in the right lower lobe. Hepatobiliary: No focal liver abnormality is seen. No gallstones, gallbladder wall thickening, or biliary dilatation. Pancreas: Unremarkable. No pancreatic ductal dilatation or surrounding inflammatory changes. Spleen: Normal in size without focal abnormality. Adrenals/Urinary Tract: Adrenal glands are unremarkable. Kidneys are normal, without renal calculi, focal lesion, or hydronephrosis. Bladder is unremarkable. Stomach/Bowel: Moderate size sliding-type hiatal hernia is noted above. No small bowel dilatation or inflammation is noted. Large amount of stool seen throughout the colon which extends to the sigmoid colon where there is severe wall thickening and severe obstruction. Stool is seen in the more distal sigmoid colon and rectum. Diverticulosis is noted in this area. This is highly concerning for malignancy, although some component of colitis or diverticulitis may be present as well. Vascular/Lymphatic: Aortic atherosclerosis. No enlarged abdominal or pelvic lymph nodes. Reproductive: Status post prostatectomy. Other: No abdominal wall hernia or abnormality. No abdominopelvic ascites. Musculoskeletal: No acute or significant osseous findings. IMPRESSION: There appears to be at least severe obstruction or near occlusion in the sigmoid colon with associated severe wall thickening, concerning for colitis or possibly colonic malignancy. Large amount of stool seen in the more proximal colon. There may be some component of diverticulitis as well. Sigmoidoscopy is recommended for further evaluation. Moderate size sliding-type hiatal hernia. 4 mm nodule noted in right lower lobe. Although likely benign, if the patient is high-risk, given the morphology and/or location of this nodule a non-contrast chest CT can be considered  in 12 months.This recommendation follows the  consensus statement: Guidelines for Management of Incidental Pulmonary Nodules Detected on CT Images: From the Fleischner Society 2017; Radiology 2017; 284:228-243. Aortic Atherosclerosis (ICD10-I70.0). Electronically Signed   By: Marijo Conception M.D.   On: 03/23/2022 12:01    Lab Results: Recent Labs    03/23/22 1059 03/24/22 0554  WBC 15.0* 14.2*  HGB 13.2 12.5*  HCT 40.6 38.7*  PLT 356 307   BMET Recent Labs    03/23/22 1059 03/24/22 0554  NA 140 141  K 4.4 3.9  CL 105 111  CO2 24 24  GLUCOSE 119* 102*  BUN 52* 40*  CREATININE 2.71* 2.25*  CALCIUM 9.3 8.5*   LFT Recent Labs    03/24/22 0554  PROT 6.0*  ALBUMIN 3.2*  AST 20  ALT 12  ALKPHOS 69  BILITOT 0.6   PT/INR No results for input(s): "LABPROT", "INR" in the last 72 hours.   Scheduled inpatient medications:   [START ON 03/25/2022] amLODipine  10 mg Oral Daily   escitalopram  5 mg Oral Daily   Glycerin (Adult)  1 suppository Rectal Once   heparin  5,000 Units Subcutaneous Q8H   levothyroxine  50 mcg Oral QAC breakfast   polyethylene glycol  17 g Oral BID   traZODone  100 mg Oral QHS   Continuous inpatient infusions:   sodium chloride     ampicillin-sulbactam (UNASYN) IV 3 g (03/24/22 0009)   PRN inpatient medications: acetaminophen **OR** acetaminophen, hydrALAZINE, ondansetron **OR** ondansetron (ZOFRAN) IV  Vital signs in last 24 hours: Temp:  [97.5 F (36.4 C)-98.2 F (36.8 C)] 97.8 F (36.6 C) (09/27 0518) Pulse Rate:  [57-72] 59 (09/27 0518) Resp:  [14-24] 17 (09/27 0518) BP: (159-178)/(63-76) 162/75 (09/27 0518) SpO2:  [85 %-100 %] 93 % (09/27 0518) Last BM Date : 03/23/22  Intake/Output Summary (Last 24 hours) at 03/24/2022 1048 Last data filed at 03/24/2022 0715 Gross per 24 hour  Intake 1475 ml  Output 550 ml  Net 925 ml    Intake/Output from previous day: 09/26 0701 - 09/27 0700 In: 4158 [P.O.:240; I.V.:785; IV Piggyback:450] Out: 250 [Urine:250] Intake/Output this  shift: Total I/O In: -  Out: 300 [Urine:300]   Physical Exam:  General: Alert male in NAD. Daughter visiting Heart:  Regular rate and rhythm. No lower extremity edema Pulmonary: Normal respiratory effort Abdomen: Soft, moderately distended, nontender. Normal bowel sounds.  Neurologic: Alert and oriented Psych: Pleasant. Cooperative.    Principal Problem:   Colonic mass Active Problems:   Colitis   Abnormal CT scan, sigmoid colon   Chronic constipation   Bloating   Abdominal distention     LOS: 1 day   Tye Savoy ,NP 03/24/2022, 10:48 AM

## 2022-03-24 NOTE — TOC Initial Note (Signed)
Transition of Care [facility]) - Initial/Assessment Note    Patient Details  Name: [name] MRN: [id number] Date of Birth: 01/09/1928  Transition of Care [facility]) CM/SW Contact:    Leeroy Cha, RN Phone Number: 03/24/2022, 8:39 AM  Clinical Narrative:                   Transition of Care [facility]) Screening Note   Patient Details  Name: [name] Date of Birth: March 19, 1928   Transition of [facility]) CM/SW Contact:    Leeroy Cha, RN Phone Number: 03/24/2022, 8:40 AM    Transition of Care Department [facility]) has reviewed patient and no TOC needs have been identified at this time. We will continue to monitor patient advancement through interdisciplinary progression rounds. If new patient transition needs arise, please place a TOC consult.   Expected Discharge Plan: Home/Self Care Barriers to Discharge: Continued Medical Work up   Patient Goals and CMS Choice Patient states their goals for this hospitalization and ongoing recovery are:: to go home CMS Medicare.gov Compare Post Acute Care list provided to:: Patient    Expected Discharge Plan and Services Expected Discharge Plan: Home/Self Care   Discharge Planning Services: CM Consult   Living arrangements for the past 2 months: [place]ment [place]                                      Prior Living Arrangements/Services Living arrangements for the past 2 months: Apartment ([facility]) Lives with:: Spouse Patient language and need for interpreter reviewed:: Yes Do you feel safe going back to the place where you live?: Yes            Criminal Activity/Legal Involvement Pertinent to Current Situation/Hospitalization: No - Comment as needed  Activities of Daily Living Home Assistive Devices/Equipment: Hearing aid, Eyeglasses ADL Screening (condition at time of admission) Patient's cognitive ability adequate to safely complete daily activities?: Yes Is the patient deaf  or have difficulty hearing?: Yes Does the patient have difficulty seeing, even when wearing glasses/contacts?: No Does the patient have difficulty concentrating, remembering, or making decisions?: No Patient able to express need for assistance with ADLs?: Yes Does the patient have difficulty dressing or bathing?: No Independently performs ADLs?: Yes (appropriate for developmental age) Does the patient have difficulty walking or climbing stairs?: Yes Weakness of Legs: None Weakness of Arms/Hands: None  Permission Sought/Granted                  Emotional Assessment Appearance:: Appears stated age Attitude/Demeanor/Rapport: Engaged Affect (typically observed): Calm Orientation: : Oriented to Self, Oriented to Place, Oriented to  Time, Oriented to Situation Alcohol / Substance Use: Never Used Psych Involvement: No (comment)  Admission diagnosis:  Colitis [K52.9] Colonic mass [K63.89] Patient Active Problem List   Diagnosis Date Noted   Colonic mass 03/23/2022   Colitis    Abnormal CT scan, sigmoid colon    Chronic constipation    Bloating    Abdominal distention    Anxiety 10/15/2021   Poor balance 10/15/2021   Abnormal colonoscopy 12/23/2019   Chronic kidney disease (CKD), stage III (moderate) ([place]) 07/24/2019   MCI (mild cognitive impairment) with memory loss 07/08/2018   Driving safety issue 05/24/2018   Change in bowel habits 04/02/2018   Fecal urgency 04/02/2018   Flatus 04/02/2018   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, with long-term current use of insulin ([place]) 01/24/2018   Constipation 09/09/2017   UTI (urinary tract infection) 02/25/2017   Hypothyroidism 12/10/2015   Cervical neck pain with evidence of disc disease 11/12/2015   Tendinopathy of left rotator cuff 04/16/2015   Hamstring muscle strain 04/16/2015   Abdominal pain, unspecified site 03/12/2014   Diabetes mellitus  with renal manifestation ([place]) 11/17/2012   Bicipital tendinitis of right shoulder 11/08/2012   Paresthesia 04/04/2012   Heel pain 11/30/2011   Insomnia 01/20/2011   Bilateral leg edema 04/08/2010   OSTEOARTHRITIS, HIP 03/05/2010   UNEQUAL LEG LENGTH 03/05/2010   LEG CRAMPS 02/17/2010   SYNCOPE 12/31/2009   CAROTID BRUIT, LEFT 12/31/2009   OBSTRUCTIVE SLEEP APNEA 11/05/2009   GLAUCOMA 03/08/2009   THYROID NODULE, RIGHT 11/13/2008   Bradycardia 11/13/2008   Thrombocytopenia ([place]) 04/17/2008   Dyslipidemia 12/04/2007   FOREIGN BODY, ASPIRATION 12/04/2007   DEPRESSION 02/04/2007   HTN (hypertension) 02/04/2007   DIVERTICULOSIS, COLON 02/04/2007   OSTEOARTHRITIS 02/04/2007   COLONIC POLYPS, HX OF 02/04/2007   PCP:  Binnie Rail, MD Pharmacy:   [facility] [address] [place] [address] Phone: [phone] Fax: [phone]     Social Determinants of Health (SDOH) Interventions    Readmission Risk Interventions   No data to display

## 2022-03-25 DIAGNOSIS — K5909 Other constipation: Secondary | ICD-10-CM | POA: Diagnosis not present

## 2022-03-25 DIAGNOSIS — R140 Abdominal distension (gaseous): Secondary | ICD-10-CM

## 2022-03-25 DIAGNOSIS — N1832 Chronic kidney disease, stage 3b: Secondary | ICD-10-CM | POA: Diagnosis not present

## 2022-03-25 DIAGNOSIS — R933 Abnormal findings on diagnostic imaging of other parts of digestive tract: Secondary | ICD-10-CM | POA: Diagnosis not present

## 2022-03-25 DIAGNOSIS — K5732 Diverticulitis of large intestine without perforation or abscess without bleeding: Secondary | ICD-10-CM | POA: Diagnosis not present

## 2022-03-25 LAB — CBC WITH DIFFERENTIAL/PLATELET
Abs Immature Granulocytes: 0.08 10*3/uL — ABNORMAL HIGH (ref 0.00–0.07)
Basophils Absolute: 0.1 10*3/uL (ref 0.0–0.1)
Basophils Relative: 0 %
Eosinophils Absolute: 0 10*3/uL (ref 0.0–0.5)
Eosinophils Relative: 0 %
HCT: 37.1 % — ABNORMAL LOW (ref 39.0–52.0)
Hemoglobin: 12.1 g/dL — ABNORMAL LOW (ref 13.0–17.0)
Immature Granulocytes: 1 %
Lymphocytes Relative: 34 %
Lymphs Abs: 4.5 10*3/uL — ABNORMAL HIGH (ref 0.7–4.0)
MCH: 31.9 pg (ref 26.0–34.0)
MCHC: 32.6 g/dL (ref 30.0–36.0)
MCV: 97.9 fL (ref 80.0–100.0)
Monocytes Absolute: 1.6 10*3/uL — ABNORMAL HIGH (ref 0.1–1.0)
Monocytes Relative: 12 %
Neutro Abs: 7.1 10*3/uL (ref 1.7–7.7)
Neutrophils Relative %: 53 %
Platelets: 309 10*3/uL (ref 150–400)
RBC: 3.79 MIL/uL — ABNORMAL LOW (ref 4.22–5.81)
RDW: 14.5 % (ref 11.5–15.5)
WBC: 13.2 10*3/uL — ABNORMAL HIGH (ref 4.0–10.5)
nRBC: 0 % (ref 0.0–0.2)

## 2022-03-25 LAB — COMPREHENSIVE METABOLIC PANEL
ALT: 11 U/L (ref 0–44)
AST: 20 U/L (ref 15–41)
Albumin: 3 g/dL — ABNORMAL LOW (ref 3.5–5.0)
Alkaline Phosphatase: 64 U/L (ref 38–126)
Anion gap: 6 (ref 5–15)
BUN: 35 mg/dL — ABNORMAL HIGH (ref 8–23)
CO2: 22 mmol/L (ref 22–32)
Calcium: 8.3 mg/dL — ABNORMAL LOW (ref 8.9–10.3)
Chloride: 113 mmol/L — ABNORMAL HIGH (ref 98–111)
Creatinine, Ser: 2.21 mg/dL — ABNORMAL HIGH (ref 0.61–1.24)
GFR, Estimated: 27 mL/min — ABNORMAL LOW (ref >60)
Glucose, Bld: 97 mg/dL (ref 70–99)
Potassium: 3.9 mmol/L (ref 3.5–5.1)
Sodium: 141 mmol/L (ref 135–145)
Total Bilirubin: 0.7 mg/dL (ref 0.3–1.2)
Total Protein: 5.7 g/dL — ABNORMAL LOW (ref 6.5–8.1)

## 2022-03-25 LAB — MAGNESIUM: Magnesium: 1.8 mg/dL (ref 1.7–2.4)

## 2022-03-25 MED ORDER — MAGNESIUM SULFATE 2 GM/50ML IV SOLN
2.0000 g | Freq: Once | INTRAVENOUS | Status: AC
  Administered 2022-03-25: 2 g via INTRAVENOUS
  Filled 2022-03-25: qty 50

## 2022-03-25 MED ORDER — OXYCODONE HCL 5 MG PO TABS: 2.5000 mg | ORAL_TABLET | Freq: Four times a day (QID) | ORAL | Status: DC | PRN

## 2022-03-25 NOTE — Progress Notes (Signed)
On call notified of irregular beats/runs reported by telemetry, pt appears asymptomatic and beats not sustained. Provider request to notify if beats are sustained.  Lab was notified to draw Am labs early.

## 2022-03-25 NOTE — Progress Notes (Signed)
PROGRESS NOTE    [name]  HUT:[id number] DOB: [birth year] DOA: 03/23/2022 PCP: Binnie Rail, MD    Chief Complaint  Patient presents with   Constipation    Brief Narrative:  Patient is a pleasant 88 year old gentleman history of chronic constipation, diverticulosis, history of hernia, hypothyroidism, OSA, hypertension, hyperlipidemia, glaucoma, anxiety, OSA, history of PUD, prostate cancer status post prostatectomy, chronic renal insufficiency, anemia, history of difficult colonoscopy due to segment of stenosis of the left colon who follows GI chronically for chronic constipation and 3 weeks prior to admission started to experience worsening symptoms.  Patient was seen by GI, suppositories recommended with increased Linzess however symptoms did not improve and due to persistent symptoms presented to the ED.  CT abdomen and pelvis done concerning for severe obstruction or near occlusion of the sigmoid colon with associated severe wall thickening concerning for colitis or possible colonic malignancy, large amount of stool seen in the more proximal colon, component of diverticulitis noted as well sigmoidoscopy recommended.  GI consulted who assessed the patient felt patient had an acute diverticulitis, recommended IV antibiotics, bowel regimen.   Assessment & Plan:   Principal Problem:   Abnormal CT scan, sigmoid colon Active Problems:   Chronic constipation   Diverticulitis of colon   Dyslipidemia   HTN (hypertension)   Hypothyroidism   Chronic kidney disease (CKD), stage III (moderate) (HCC)   Colitis   Bloating   Abdominal distention  #1 chronic constipation/CT findings of severe obstruction near occlusion in the sigmoid colon with associated severe wall thickening/large amount of stool more proximal colon possible component of diverticulitis -Patient seen in consultation by GI who felt unlikely malignancy given the long segment of narrowing and noted to have had severe left  colon stenosis on colonoscopy in 2019. -Per GI few patient may have a component of acute diverticulitis in addition to constipation. -Patient with bowel movements over the past 2 days with some clinical improvement on IV antibiotics.  -Patient currently afebrile, leukocytosis slowly trending down. -Patient tolerating full liquids. -Noted to have left lower quadrant abdominal pain yesterday which improved after bowel movement. -Continue empiric IV Unasyn, MiraLAX twice daily per GI recommendations. -Continue Tylenol as needed for pain.  We will place on low-dose oxycodone 2.5 mg every 6 hours as needed for severe pain.  Try to minimize narcotics due to presentation with constipation. -Per GI patient with continued improvement could likely hold off on lower endoscopy at this time with outpatient follow-up. -Supportive care.  2.  4 mm right lower lobe lung nodule on CT scan -Outpatient follow-up.  3.  Hypothyroidism -Continue home regimen Synthroid.  4.  Hypertension -Continue current regimen of Norvasc 10 mg daily.   -IV hydralazine as needed.   5.  CKD stage IIIb -Saline lock IV fluids.   -Follow.  6.  Anemia -Follow H&H.  7.  Hyperlipidemia -Continue to hold statin, resume on discharge.  8.  Depression -Lexapro.    DVT prophylaxis: Heparin Code Status: Full Family Communication: Updated patient, no family at bedside.  GI noted to have updated wife on telephone today.   Disposition: Likely home when clinically improved and cleared by gastroenterology.  Status is: Inpatient Remains inpatient appropriate because: Severity of illness   Consultants:  Gastroenterology: Dr. Rush Landmark 03/23/2022  Procedures:  CT abdomen and pelvis 03/23/2022  Antimicrobials:  IV Unasyn 03/23/2022>>>>>   Subjective: Laying in bed.  States had some left lower quadrant abdominal pain last night which was relieved after having bowel  movement, this morning.  No chest pain.  No shortness of  breath.  Overall feeling better.  Tolerating full liquids.  Objective: Vitals:   03/24/22 0421 03/24/22 0518 03/24/22 2015 03/25/22 0622  BP:  (!) 162/75 (!) 169/71 (!) 167/70  Pulse:  (!) 59 61 63  Resp:  '17 18 18  '$ Temp:  97.8 F (36.6 C) 98.2 F (36.8 C) 97.9 F (36.6 C)  TempSrc:  Oral Oral Oral  SpO2: 95% 93% 98% 91%  Weight:      Height:        Intake/Output Summary (Last 24 hours) at 03/25/2022 1259 Last data filed at 03/25/2022 0646 Gross per 24 hour  Intake 200.06 ml  Output 1052 ml  Net -851.94 ml    Filed Weights   03/23/22 0959  Weight: 84.4 kg    Examination:  General exam: NAD. Respiratory system: CTA B.  No wheezes, no crackles, no rhonchi.  Fair air movement.  Speaking in full sentences.  Cardiovascular system: RRR no murmurs rubs or gallops.  No JVD.  No lower extremity edema.  Gastrointestinal system: Abdomen is mildly distended, soft, some tender tenderness to palpation in the left lower quadrant on deep palpation.  Hyperactive bowel sounds.  No rebound.  No guarding.   Central nervous system: Alert and oriented. No focal neurological deficits. Extremities: Symmetric 5 x 5 power. Skin: No rashes, lesions or ulcers Psychiatry: Judgement and insight appear normal. Mood & affect appropriate.     Data Reviewed: I have personally reviewed following labs and imaging studies  CBC: Recent Labs  Lab 03/23/22 1059 03/24/22 0554 03/25/22 0253  WBC 15.0* 14.2* 13.2*  NEUTROABS 8.2*  --  7.1  HGB 13.2 12.5* 12.1*  HCT 40.6 38.7* 37.1*  MCV 95.8 97.7 97.9  PLT 356 307 309     Basic Metabolic Panel: Recent Labs  Lab 03/23/22 1059 03/24/22 0554 03/25/22 0253  NA 140 141 141  K 4.4 3.9 3.9  CL 105 111 113*  CO2 '24 24 22  '$ GLUCOSE 119* 102* 97  BUN 52* 40* 35*  CREATININE 2.71* 2.25* 2.21*  CALCIUM 9.3 8.5* 8.3*  MG  --   --  1.8     GFR: Estimated Creatinine Clearance: 22.7 mL/min (A) (by C-G formula based on SCr of 2.21 mg/dL  (H)).  Liver Function Tests: Recent Labs  Lab 03/23/22 1059 03/24/22 0554 03/25/22 0253  AST '18 20 20  '$ ALT '10 12 11  '$ ALKPHOS 77 69 64  BILITOT 0.6 0.6 0.7  PROT 7.0 6.0* 5.7*  ALBUMIN 4.3 3.2* 3.0*     CBG: No results for input(s): "GLUCAP" in the last 168 hours.   No results found for this or any previous visit (from the past 240 hour(s)).       Radiology Studies: No results found.      Scheduled Meds:  amLODipine  10 mg Oral Daily   escitalopram  5 mg Oral Daily   Glycerin (Adult)  1 suppository Rectal Once   heparin  5,000 Units Subcutaneous Q8H   levothyroxine  50 mcg Oral QAC breakfast   polyethylene glycol  17 g Oral BID   traZODone  100 mg Oral QHS   Continuous Infusions:  ampicillin-sulbactam (UNASYN) IV Stopped (03/25/22 0148)     LOS: 2 days    Time spent: 35 minutes    Irine Seal, MD [facility]   To contact the attending provider between 7A-7P or the covering provider during after hours 7P-7A, please  log into the web site [email or web address] and access using universal [place] password for that web site. If you do not have the password, please call the hospital operator.  03/25/2022, 12:59 PM

## 2022-03-25 NOTE — Progress Notes (Addendum)
Daily Progress Note  Hospital Day: 3  Chief Complaint:  LLQ pain, near colonic obstruction on CT scan   Brief History 88 yo male with a pmh of constipation, diverticulosis, hiatal hernia, hypothyroidism, OSA, HTN, HLD, CKD 3, glaucoma. Admitted 9/26 for abdominal pain and colonic obstruction / ? Diverticulitis.    We saw him in consultation 9/26  Assessment / Plan  88 yo male with chronic constipation / CT scan showing severe obstruction or near occlusion in the sigmoid colon with associated severe wall thickening.  Possibly a component of diverticulitis. Seems unlikely to be malignancy given the long segment of narrowing. Additionally he had this severe left colon stenosis on colonoscopy in 2019.  Afebrile, WBC continues to slowly improve on Unasyn, 13.2K He had significant LLQ pain last night, it was difficult to sleep due to the pain. However, after a BM this am the pain has improved.  Will give another day of antibiotics before deciding on need for lower endoscopy Continue BID miralax  I tried to call wife [name] and give update per patient request. Let VM that I would try again later  ADDENDUM: I was able to reach wife [name]. Lengthy discussion giving her an update on husband.    # 4 mm RLL lung nodule on CT scan  Subjective   He had significant LLQ pain last night, it was difficult to sleep due to the pain. However, after a BM this am the pain has improved.   Objective    Imaging:  CT ABDOMEN PELVIS WO CONTRAST  Result Date: 03/23/2022 CLINICAL DATA:  Acute left lower quadrant abdominal pain. EXAM: CT ABDOMEN AND PELVIS WITHOUT CONTRAST TECHNIQUE: Multidetector CT imaging of the abdomen and pelvis was performed following the standard protocol without IV contrast. RADIATION DOSE REDUCTION: This exam was performed according to the departmental dose-optimization program which includes automated exposure control, adjustment of the mA and/or kV according to patient size  and/or use of iterative reconstruction technique. COMPARISON:  None Available. FINDINGS: Lower chest: Moderate size sliding-type hiatal hernia is noted. 4 mm nodule is noted posteriorly in the right lower lobe. Hepatobiliary: No focal liver abnormality is seen. No gallstones, gallbladder wall thickening, or biliary dilatation. Pancreas: Unremarkable. No pancreatic ductal dilatation or surrounding inflammatory changes. Spleen: Normal in size without focal abnormality. Adrenals/Urinary Tract: Adrenal glands are unremarkable. Kidneys are normal, without renal calculi, focal lesion, or hydronephrosis. Bladder is unremarkable. Stomach/Bowel: Moderate size sliding-type hiatal hernia is noted above. No small bowel dilatation or inflammation is noted. Large amount of stool seen throughout the colon which extends to the sigmoid colon where there is severe wall thickening and severe obstruction. Stool is seen in the more distal sigmoid colon and rectum. Diverticulosis is noted in this area. This is highly concerning for malignancy, although some component of colitis or diverticulitis may be present as well. Vascular/Lymphatic: Aortic atherosclerosis. No enlarged abdominal or pelvic lymph nodes. Reproductive: Status post prostatectomy. Other: No abdominal wall hernia or abnormality. No abdominopelvic ascites. Musculoskeletal: No acute or significant osseous findings. IMPRESSION: There appears to be at least severe obstruction or near occlusion in the sigmoid colon with associated severe wall thickening, concerning for colitis or possibly colonic malignancy. Large amount of stool seen in the more proximal colon. There may be some component of diverticulitis as well. Sigmoidoscopy is recommended for further evaluation. Moderate size sliding-type hiatal hernia. 4 mm nodule noted in right lower lobe. Although likely benign, if the patient is high-risk, given the morphology  and/or location of this nodule a non-contrast chest CT can  be considered in 12 months.This recommendation follows the consensus statement: Guidelines for Management of Incidental Pulmonary Nodules Detected on CT Images: From the Fleischner Society 2017; Radiology 2017; 284:228-243. Aortic Atherosclerosis (ICD10-I70.0). Electronically Signed   By: Marijo Conception M.D.   On: 03/23/2022 12:01    Lab Results: Recent Labs    03/23/22 1059 03/24/22 0554 03/25/22 0253  WBC 15.0* 14.2* 13.2*  HGB 13.2 12.5* 12.1*  HCT 40.6 38.7* 37.1*  PLT 356 307 309   BMET Recent Labs    03/23/22 1059 03/24/22 0554 03/25/22 0253  NA 140 141 141  K 4.4 3.9 3.9  CL 105 111 113*  CO2 '24 24 22  '$ GLUCOSE 119* 102* 97  BUN 52* 40* 35*  CREATININE 2.71* 2.25* 2.21*  CALCIUM 9.3 8.5* 8.3*   LFT Recent Labs    03/25/22 0253  PROT 5.7*  ALBUMIN 3.0*  AST 20  ALT 11  ALKPHOS 64  BILITOT 0.7   PT/INR No results for input(s): "LABPROT", "INR" in the last 72 hours.   Scheduled inpatient medications:   amLODipine  10 mg Oral Daily   escitalopram  5 mg Oral Daily   Glycerin (Adult)  1 suppository Rectal Once   heparin  5,000 Units Subcutaneous Q8H   levothyroxine  50 mcg Oral QAC breakfast   polyethylene glycol  17 g Oral BID   traZODone  100 mg Oral QHS   Continuous inpatient infusions:   ampicillin-sulbactam (UNASYN) IV Stopped (03/25/22 0148)   magnesium sulfate bolus IVPB 2 g (03/25/22 0941)   PRN inpatient medications: acetaminophen **OR** acetaminophen, hydrALAZINE, ondansetron **OR** ondansetron (ZOFRAN) IV  Vital signs in last 24 hours: Temp:  [97.9 F (36.6 C)-98.2 F (36.8 C)] 97.9 F (36.6 C) (09/28 0622) Pulse Rate:  [61-63] 63 (09/28 0622) Resp:  [18] 18 (09/28 0622) BP: (167-169)/(70-71) 167/70 (09/28 0622) SpO2:  [91 %-98 %] 91 % (09/28 0622) Last BM Date : 03/24/22  Intake/Output Summary (Last 24 hours) at 03/25/2022 1009 Last data filed at 03/25/2022 0646 Gross per 24 hour  Intake 200.06 ml  Output 1052 ml  Net -851.94 ml     Intake/Output from previous day: 09/27 0701 - 09/28 0700 In: 200.1 [IV Piggyback:200.1] Out: 1448 [Urine:1351; Stool:1] Intake/Output this shift: No intake/output data recorded.   Physical Exam:  General: Alert male in NAD Heart:  Regular rate and rhythm. No lower extremity edema Pulmonary: Normal respiratory effort Abdomen: Soft, distended but less so than yesterday. A few "metallic" bowel sounds. Mild-mod LLQ tenderness. No peritoneal signs.   Neurologic: Alert and oriented Psych: Pleasant. Cooperative.    Principal Problem:   Abnormal CT scan, sigmoid colon Active Problems:   Dyslipidemia   HTN (hypertension)   Hypothyroidism   Chronic kidney disease (CKD), stage III (moderate) (HCC)   Colitis   Chronic constipation   Bloating   Abdominal distention   Diverticulitis of colon     LOS: 2 days   Tye Savoy ,NP 03/25/2022, 10:09 AM

## 2022-03-26 ENCOUNTER — Inpatient Hospital Stay (HOSPITAL_COMMUNITY): Payer: PPO

## 2022-03-26 DIAGNOSIS — R1030 Lower abdominal pain, unspecified: Secondary | ICD-10-CM

## 2022-03-26 DIAGNOSIS — R140 Abdominal distension (gaseous): Secondary | ICD-10-CM | POA: Diagnosis not present

## 2022-03-26 DIAGNOSIS — K5909 Other constipation: Secondary | ICD-10-CM | POA: Diagnosis not present

## 2022-03-26 DIAGNOSIS — R933 Abnormal findings on diagnostic imaging of other parts of digestive tract: Secondary | ICD-10-CM | POA: Diagnosis not present

## 2022-03-26 DIAGNOSIS — N1832 Chronic kidney disease, stage 3b: Secondary | ICD-10-CM | POA: Diagnosis not present

## 2022-03-26 LAB — CBC WITH DIFFERENTIAL/PLATELET
Abs Immature Granulocytes: 0 10*3/uL (ref 0.00–0.07)
Basophils Absolute: 0 10*3/uL (ref 0.0–0.1)
Basophils Relative: 0 %
Eosinophils Absolute: 0 10*3/uL (ref 0.0–0.5)
Eosinophils Relative: 0 %
HCT: 41.9 % (ref 39.0–52.0)
Hemoglobin: 13.5 g/dL (ref 13.0–17.0)
Lymphocytes Relative: 7 %
Lymphs Abs: 1.4 10*3/uL (ref 0.7–4.0)
MCH: 31.3 pg (ref 26.0–34.0)
MCHC: 32.2 g/dL (ref 30.0–36.0)
MCV: 97.2 fL (ref 80.0–100.0)
Monocytes Absolute: 0.6 10*3/uL (ref 0.1–1.0)
Monocytes Relative: 3 %
Neutro Abs: 17.6 10*3/uL — ABNORMAL HIGH (ref 1.7–7.7)
Neutrophils Relative %: 90 %
Platelets: 402 10*3/uL — ABNORMAL HIGH (ref 150–400)
RBC: 4.31 MIL/uL (ref 4.22–5.81)
RDW: 14.2 % (ref 11.5–15.5)
WBC: 19.5 10*3/uL — ABNORMAL HIGH (ref 4.0–10.5)
nRBC: 0 % (ref 0.0–0.2)

## 2022-03-26 LAB — BASIC METABOLIC PANEL
Anion gap: 11 (ref 5–15)
BUN: 30 mg/dL — ABNORMAL HIGH (ref 8–23)
CO2: 20 mmol/L — ABNORMAL LOW (ref 22–32)
Calcium: 8.8 mg/dL — ABNORMAL LOW (ref 8.9–10.3)
Chloride: 106 mmol/L (ref 98–111)
Creatinine, Ser: 2.32 mg/dL — ABNORMAL HIGH (ref 0.61–1.24)
GFR, Estimated: 26 mL/min — ABNORMAL LOW (ref >60)
Glucose, Bld: 151 mg/dL — ABNORMAL HIGH (ref 70–99)
Potassium: 3.9 mmol/L (ref 3.5–5.1)
Sodium: 137 mmol/L (ref 135–145)

## 2022-03-26 LAB — MAGNESIUM: Magnesium: 2.2 mg/dL (ref 1.7–2.4)

## 2022-03-26 NOTE — Progress Notes (Signed)
PROGRESS NOTE    [name]  BSW:[id number] DOB: [birth year] DOA: 03/23/2022 PCP: Binnie Rail, MD    Chief Complaint  Patient presents with   Constipation    Brief Narrative:  Patient is a pleasant 88 year old gentleman history of chronic constipation, diverticulosis, history of hernia, hypothyroidism, OSA, hypertension, hyperlipidemia, glaucoma, anxiety, OSA, history of PUD, prostate cancer status post prostatectomy, chronic renal insufficiency, anemia, history of difficult colonoscopy due to segment of stenosis of the left colon who follows GI chronically for chronic constipation and 3 weeks prior to admission started to experience worsening symptoms.  Patient was seen by GI, suppositories recommended with increased Linzess however symptoms did not improve and due to persistent symptoms presented to the ED.  CT abdomen and pelvis done concerning for severe obstruction or near occlusion of the sigmoid colon with associated severe wall thickening concerning for colitis or possible colonic malignancy, large amount of stool seen in the more proximal colon, component of diverticulitis noted as well sigmoidoscopy recommended.  GI consulted who assessed the patient felt patient had an acute diverticulitis, recommended IV antibiotics, bowel regimen.   Assessment & Plan:   Principal Problem:   Abnormal CT scan, sigmoid colon Active Problems:   Chronic constipation   Diverticulitis of colon   Dyslipidemia   HTN (hypertension)   Hypothyroidism   Chronic kidney disease (CKD), stage III (moderate) (HCC)   Colitis   Bloating   Abdominal distention  #1 chronic constipation/CT findings of severe obstruction near occlusion in the sigmoid colon with associated severe wall thickening/large amount of stool more proximal colon possible component of diverticulitis -Patient seen in consultation by GI who felt unlikely malignancy given the long segment of narrowing and noted to have had severe left  colon stenosis on colonoscopy in 2019. -Per GI few patient may have a component of acute diverticulitis in addition to constipation. -Patient with bowel movements over the past 3 days with some clinical improvement on IV antibiotics.  -Patient currently afebrile, leukocytosis has trended back up however patient does not look toxic on examination.  -KUB done this morning with some persistent gaseous distention of primary ascending and transverse colon with maximum diameter of approximately 9 cm at the level of the cecum.  Patient known to have chronic sigmoid stricture likely causing the proximal colonic dilatation. -Patient tolerating full liquids. -Diet being advanced to a low residue diet per GI. -Noted to have left lower quadrant abdominal pain on 03/24/2022, which improved after bowel movement. -Continue empiric IV Unasyn, MiraLAX twice daily per GI recommendations. -Continue Tylenol as needed for pain.   -Continue on low-dose oxycodone 2.5 mg every 6 hours as needed for severe pain.  Try to minimize narcotics due to presentation with constipation. -Per GI patient with continued improvement could likely hold off on lower endoscopy at this time with outpatient follow-up. -Supportive care.  2.  4 mm right lower lobe lung nodule on CT scan -Outpatient follow-up.  3.  Hypothyroidism -Synthroid.    4.  Hypertension -Continue current regimen of Norvasc 10 mg daily.   -IV hydralazine as needed.   5.  CKD stage IIIb -Saline lock IV fluids.   -Follow.  6.  Anemia -Hemoglobin stable at 13.5.   7.  Hyperlipidemia -Continue to hold statin, resume on discharge.  8.  Depression -Continue Lexapro.    DVT prophylaxis: Heparin Code Status: Full Family Communication: Updated patient, updated wife at bedside.   Disposition: Likely home when clinically improved and cleared by gastroenterology.  Status is: Inpatient Remains inpatient appropriate because: Severity of illness   Consultants:   Gastroenterology: Dr. Rush Landmark 03/23/2022  Procedures:  CT abdomen and pelvis 03/23/2022  Antimicrobials:  IV Unasyn 03/23/2022>>>>>   Subjective: Noted to be ambulating in the hallway with mobility specialist.  Patient after ambulation seen sitting in the chair.  Denies any chest pain.  No shortness of breath.  States abdominal pain improving.  Tolerated full liquid diet.  Overall states he is feeling better.  Had bowel movement earlier this morning per patient.    Objective: Vitals:   03/25/22 0622 03/25/22 2028 03/26/22 0540 03/26/22 1317  BP: (!) 167/70 (!) 153/68 (!) 150/77 (!) 150/66  Pulse: 63 68 88 63  Resp: '18 16 20 18  '$ Temp: 97.9 F (36.6 C) 98.3 F (36.8 C) (!) 97.5 F (36.4 C) 97.9 F (36.6 C)  TempSrc: Oral Oral Oral Oral  SpO2: 91% 99% 95% 100%  Weight:      Height:        Intake/Output Summary (Last 24 hours) at 03/26/2022 1806 Last data filed at 03/26/2022 0327 Gross per 24 hour  Intake 100 ml  Output --  Net 100 ml    Filed Weights   03/23/22 0959  Weight: 84.4 kg    Examination:  General exam: No acute distress. Respiratory system: Lungs clear to auscultation bilaterally.  No wheezes, no crackles, no rhonchi.  Fair air movement.  Speaking in full sentences.  Cardiovascular system: Regular rate rhythm no murmurs rubs or gallops.  No JVD.  No lower extremity edema.  Gastrointestinal system: Abdomen is mildly distended, soft, some tenderness to deep palpation left lower quadrant.  Positive bowel sounds.  No rebound.  No guarding.    Central nervous system: Alert and oriented. No focal neurological deficits. Extremities: Symmetric 5 x 5 power. Skin: No rashes, lesions or ulcers Psychiatry: Judgement and insight appear normal. Mood & affect appropriate.     Data Reviewed: I have personally reviewed following labs and imaging studies  CBC: Recent Labs  Lab 03/23/22 1059 03/24/22 0554 03/25/22 0253 03/26/22 0622  WBC 15.0* 14.2* 13.2* 19.5*   NEUTROABS 8.2*  --  7.1 17.6*  HGB 13.2 12.5* 12.1* 13.5  HCT 40.6 38.7* 37.1* 41.9  MCV 95.8 97.7 97.9 97.2  PLT 356 307 309 402*     Basic Metabolic Panel: Recent Labs  Lab 03/23/22 1059 03/24/22 0554 03/25/22 0253 03/26/22 0622  NA 140 141 141 137  K 4.4 3.9 3.9 3.9  CL 105 111 113* 106  CO2 '24 24 22 '$ 20*  GLUCOSE 119* 102* 97 151*  BUN 52* 40* 35* 30*  CREATININE 2.71* 2.25* 2.21* 2.32*  CALCIUM 9.3 8.5* 8.3* 8.8*  MG  --   --  1.8 2.2     GFR: Estimated Creatinine Clearance: 21.6 mL/min (A) (by C-G formula based on SCr of 2.32 mg/dL (H)).  Liver Function Tests: Recent Labs  Lab 03/23/22 1059 03/24/22 0554 03/25/22 0253  AST '18 20 20  '$ ALT '10 12 11  '$ ALKPHOS 77 69 64  BILITOT 0.6 0.6 0.7  PROT 7.0 6.0* 5.7*  ALBUMIN 4.3 3.2* 3.0*     CBG: No results for input(s): "GLUCAP" in the last 168 hours.   No results found for this or any previous visit (from the past 240 hour(s)).       Radiology Studies: DG Abd 2 Views  Result Date: 03/26/2022 CLINICAL DATA:  Abdominal pain.  Sigmoid colonic narrowing by CT. EXAM: ABDOMEN -  2 VIEW COMPARISON:  CT of the abdomen and pelvis on 03/23/2022 FINDINGS: There remains some gaseous distension of primarily the ascending and transverse colon with maximum diameter of approximately 9 cm at the level of the cecum. No evidence of free intraperitoneal air or significant small bowel dilatation. No abnormal calcifications. IMPRESSION: Persistent gaseous distension of primarily the ascending and transverse colon with maximum diameter of approximately 9 cm at the level of the cecum. Electronically Signed   By: Aletta Edouard M.D.   On: 03/26/2022 10:48        Scheduled Meds:  amLODipine  10 mg Oral Daily   escitalopram  5 mg Oral Daily   Glycerin (Adult)  1 suppository Rectal Once   heparin  5,000 Units Subcutaneous Q8H   levothyroxine  50 mcg Oral QAC breakfast   polyethylene glycol  17 g Oral BID   traZODone  100 mg  Oral QHS   Continuous Infusions:  ampicillin-sulbactam (UNASYN) IV 3 g (03/26/22 1753)     LOS: 3 days    Time spent: 35 minutes    Irine Seal, MD [facility]   To contact the attending provider between 7A-7P or the covering provider during after hours 7P-7A, please log into the web site [email or web address] and access using universal [place] password for that web site. If you do not have the password, please call the hospital operator.  03/26/2022, 6:06 PM

## 2022-03-26 NOTE — Progress Notes (Addendum)
Daily Progress Note  Hospital Day: 4  Chief Complaint: LLQ pain, near colonic obstruction on CT scan   Brief History 88 yo male with a pmh of constipation, diverticulosis, hiatal hernia, hypothyroidism, OSA, HTN, HLD, CKD 3, glaucoma. Admitted 9/26 for abdominal pain and colonic obstruction / ? Diverticulitis.      Assessment / Plan  # 88 yo male with known left sided colonic stricture.  Admitted with LLQ pain, acute on chronic constipation and CT scan showing severe obstruction or near occlusion in the sigmoid colon with associated severe wall thickening.  Possibly a component of diverticulitis. Seems unlikely to be malignancy given the long segment of narrowing. Additionally he had this severe left colon stenosis on colonoscopy in 2019.  WBC had been improving but for unclear reasons it rose overnight from 13 to 19.5k . He is afebrile and clinically he has improved. No significant abdominal pain. Bowels are moving, abdomen is soft. Tolerating full liquids. Had one episode of vomiting last night but that was after eating something that didn't agree with him.  Two view abdomen this am shows persistent gaseous distention of ascending and transverse colon ( 9 cm at level of cecum). No evidence for perforation.   Will repeat CBC in am. If WBC stable and continues to improve clinically then possibly home on antibiotics tomorrow.  Continue BID miralax.  Will try low residue diet  Subjective  No significant abdominal pain. Bowels are moving. Tolerating full liquids. Had one episode of vomiting last night but that was after eating something that didn't agree with him.   Objective   Imaging:  DG Abd 2 Views  Result Date: 03/26/2022 CLINICAL DATA:  Abdominal pain.  Sigmoid colonic narrowing by CT. EXAM: ABDOMEN - 2 VIEW COMPARISON:  CT of the abdomen and pelvis on 03/23/2022 FINDINGS: There remains some gaseous distension of primarily the ascending and transverse colon with maximum diameter  of approximately 9 cm at the level of the cecum. No evidence of free intraperitoneal air or significant small bowel dilatation. No abnormal calcifications. IMPRESSION: Persistent gaseous distension of primarily the ascending and transverse colon with maximum diameter of approximately 9 cm at the level of the cecum. Electronically Signed   By: Aletta Edouard M.D.   On: 03/26/2022 10:48   CT ABDOMEN PELVIS WO CONTRAST  Result Date: 03/23/2022 CLINICAL DATA:  Acute left lower quadrant abdominal pain. EXAM: CT ABDOMEN AND PELVIS WITHOUT CONTRAST TECHNIQUE: Multidetector CT imaging of the abdomen and pelvis was performed following the standard protocol without IV contrast. RADIATION DOSE REDUCTION: This exam was performed according to the departmental dose-optimization program which includes automated exposure control, adjustment of the mA and/or kV according to patient size and/or use of iterative reconstruction technique. COMPARISON:  None Available. FINDINGS: Lower chest: Moderate size sliding-type hiatal hernia is noted. 4 mm nodule is noted posteriorly in the right lower lobe. Hepatobiliary: No focal liver abnormality is seen. No gallstones, gallbladder wall thickening, or biliary dilatation. Pancreas: Unremarkable. No pancreatic ductal dilatation or surrounding inflammatory changes. Spleen: Normal in size without focal abnormality. Adrenals/Urinary Tract: Adrenal glands are unremarkable. Kidneys are normal, without renal calculi, focal lesion, or hydronephrosis. Bladder is unremarkable. Stomach/Bowel: Moderate size sliding-type hiatal hernia is noted above. No small bowel dilatation or inflammation is noted. Large amount of stool seen throughout the colon which extends to the sigmoid colon where there is severe wall thickening and severe obstruction. Stool is seen in the more distal sigmoid colon and rectum. Diverticulosis  is noted in this area. This is highly concerning for malignancy, although some component  of colitis or diverticulitis may be present as well. Vascular/Lymphatic: Aortic atherosclerosis. No enlarged abdominal or pelvic lymph nodes. Reproductive: Status post prostatectomy. Other: No abdominal wall hernia or abnormality. No abdominopelvic ascites. Musculoskeletal: No acute or significant osseous findings. IMPRESSION: There appears to be at least severe obstruction or near occlusion in the sigmoid colon with associated severe wall thickening, concerning for colitis or possibly colonic malignancy. Large amount of stool seen in the more proximal colon. There may be some component of diverticulitis as well. Sigmoidoscopy is recommended for further evaluation. Moderate size sliding-type hiatal hernia. 4 mm nodule noted in right lower lobe. Although likely benign, if the patient is high-risk, given the morphology and/or location of this nodule a non-contrast chest CT can be considered in 12 months.This recommendation follows the consensus statement: Guidelines for Management of Incidental Pulmonary Nodules Detected on CT Images: From the Fleischner Society 2017; Radiology 2017; 284:228-243. Aortic Atherosclerosis (ICD10-I70.0). Electronically Signed   By: Marijo Conception M.D.   On: 03/23/2022 12:01    Lab Results: Recent Labs    03/24/22 0554 03/25/22 0253 03/26/22 0622  WBC 14.2* 13.2* 19.5*  HGB 12.5* 12.1* 13.5  HCT 38.7* 37.1* 41.9  PLT 307 309 402*   BMET Recent Labs    03/24/22 0554 03/25/22 0253 03/26/22 0622  NA 141 141 137  K 3.9 3.9 3.9  CL 111 113* 106  CO2 24 22 20*  GLUCOSE 102* 97 151*  BUN 40* 35* 30*  CREATININE 2.25* 2.21* 2.32*  CALCIUM 8.5* 8.3* 8.8*   LFT Recent Labs    03/25/22 0253  PROT 5.7*  ALBUMIN 3.0*  AST 20  ALT 11  ALKPHOS 64  BILITOT 0.7   PT/INR No results for input(s): "LABPROT", "INR" in the last 72 hours.   Scheduled inpatient medications:   amLODipine  10 mg Oral Daily   escitalopram  5 mg Oral Daily   Glycerin (Adult)  1  suppository Rectal Once   heparin  5,000 Units Subcutaneous Q8H   levothyroxine  50 mcg Oral QAC breakfast   polyethylene glycol  17 g Oral BID   traZODone  100 mg Oral QHS   Continuous inpatient infusions:   ampicillin-sulbactam (UNASYN) IV Stopped (03/26/22 0229)   PRN inpatient medications: acetaminophen **OR** acetaminophen, hydrALAZINE, ondansetron **OR** ondansetron (ZOFRAN) IV, oxyCODONE  Vital signs in last 24 hours: Temp:  [97.5 F (36.4 C)-98.3 F (36.8 C)] 97.5 F (36.4 C) (09/29 0540) Pulse Rate:  [68-88] 88 (09/29 0540) Resp:  [16-20] 20 (09/29 0540) BP: (150-153)/(68-77) 150/77 (09/29 0540) SpO2:  [95 %-99 %] 95 % (09/29 0540) Last BM Date : 03/24/22  Intake/Output Summary (Last 24 hours) at 03/26/2022 1108 Last data filed at 03/26/2022 0327 Gross per 24 hour  Intake 680 ml  Output --  Net 680 ml    Intake/Output from previous day: 09/28 0701 - 09/29 0700 In: 680 [P.O.:480; IV Piggyback:200] Out: -  Intake/Output this shift: No intake/output data recorded.   Physical Exam:  General: Alert male in NAD Heart:  Regular rate and rhythm, 2+ BLE edema.  Pulmonary: Normal respiratory effort Abdomen: Soft, distended, minimal LLQ tenderness. Active bowel sounds. Mild abdominal wall edema Neurologic: Alert and oriented Psych: Pleasant. Cooperative.    Principal Problem:   Abnormal CT scan, sigmoid colon Active Problems:   Dyslipidemia   HTN (hypertension)   Hypothyroidism   Chronic kidney disease (CKD), stage  Richardson (moderate) (HCC)   Colitis   Chronic constipation   Bloating   Abdominal distention   Diverticulitis of colon     LOS: 3 days   [name] ,NP 03/26/2022, 11:08 AM   I have taken an interval history, thoroughly reviewed the chart and examined the patient. I agree with the Advanced Practitioner's note, impression and recommendations, and have recorded additional findings, impressions and recommendations below. I performed a substantive  portion of this encounter (>50% time spent), including a complete performance of the medical decision making.  My additional thoughts are as follows:  Signout received from Dr. Rush Landmark, chart review performed, case also discussed with our APP before and after my evaluation of him.  His pain has resolved and he is moving his bowels.  He has no residual stool seen in the colon on KUB today.  There is proximal colonic dilatation as expected, because we believe he has a chronic sigmoid stricture.  It is not entirely clear if he has diverticulitis at this time, but he is being treated as such.  His white count did increase to 9 point 0.5 today, but he has no fever, he looks well, has no pain or tenderness on exam, and I think does not have any worsening of what ever infection may be present.  We put him on a low residue diet and I believe he will likely go home tomorrow finishing a course of antibiotics for outpatient follow-up with Dr. Rush Landmark.  He should also go home on a capful of MiraLAX daily.   [name] Office:[phone]

## 2022-03-26 NOTE — Progress Notes (Signed)
Mobility Specialist - Progress Note   03/26/22 1300  Mobility  Activity Ambulated with assistance in hallway  Level of Assistance Independent after set-up  Assistive Device None  Distance Ambulated (ft) 1000 ft  Activity Response Tolerated well  $Mobility charge 1 Mobility   Pt received in chair and agreed for mobility, no c/o pain nor discomfort during ambulation. Pt to chair with all needs met and family in room.   Roderick Pee Mobility Specialist

## 2022-03-27 DIAGNOSIS — K5730 Diverticulosis of large intestine without perforation or abscess without bleeding: Secondary | ICD-10-CM

## 2022-03-27 DIAGNOSIS — K5909 Other constipation: Secondary | ICD-10-CM | POA: Diagnosis not present

## 2022-03-27 DIAGNOSIS — N1832 Chronic kidney disease, stage 3b: Secondary | ICD-10-CM | POA: Diagnosis not present

## 2022-03-27 DIAGNOSIS — R933 Abnormal findings on diagnostic imaging of other parts of digestive tract: Secondary | ICD-10-CM | POA: Diagnosis not present

## 2022-03-27 DIAGNOSIS — R140 Abdominal distension (gaseous): Secondary | ICD-10-CM | POA: Diagnosis not present

## 2022-03-27 LAB — MAGNESIUM: Magnesium: 2.1 mg/dL (ref 1.7–2.4)

## 2022-03-27 LAB — CBC WITH DIFFERENTIAL/PLATELET
Abs Immature Granulocytes: 0.13 10*3/uL — ABNORMAL HIGH (ref 0.00–0.07)
Basophils Absolute: 0.1 10*3/uL (ref 0.0–0.1)
Basophils Relative: 0 %
Eosinophils Absolute: 0 10*3/uL (ref 0.0–0.5)
Eosinophils Relative: 0 %
HCT: 36.4 % — ABNORMAL LOW (ref 39.0–52.0)
Hemoglobin: 11.8 g/dL — ABNORMAL LOW (ref 13.0–17.0)
Immature Granulocytes: 1 %
Lymphocytes Relative: 28 %
Lymphs Abs: 5 10*3/uL — ABNORMAL HIGH (ref 0.7–4.0)
MCH: 31.4 pg (ref 26.0–34.0)
MCHC: 32.4 g/dL (ref 30.0–36.0)
MCV: 96.8 fL (ref 80.0–100.0)
Monocytes Absolute: 1.9 10*3/uL — ABNORMAL HIGH (ref 0.1–1.0)
Monocytes Relative: 11 %
Neutro Abs: 10.5 10*3/uL — ABNORMAL HIGH (ref 1.7–7.7)
Neutrophils Relative %: 60 %
Platelets: 321 10*3/uL (ref 150–400)
RBC: 3.76 MIL/uL — ABNORMAL LOW (ref 4.22–5.81)
RDW: 14.2 % (ref 11.5–15.5)
WBC: 17.6 10*3/uL — ABNORMAL HIGH (ref 4.0–10.5)
nRBC: 0 % (ref 0.0–0.2)

## 2022-03-27 LAB — BASIC METABOLIC PANEL
Anion gap: 8 (ref 5–15)
BUN: 32 mg/dL — ABNORMAL HIGH (ref 8–23)
CO2: 22 mmol/L (ref 22–32)
Calcium: 8.3 mg/dL — ABNORMAL LOW (ref 8.9–10.3)
Chloride: 107 mmol/L (ref 98–111)
Creatinine, Ser: 2.51 mg/dL — ABNORMAL HIGH (ref 0.61–1.24)
GFR, Estimated: 23 mL/min — ABNORMAL LOW (ref >60)
Glucose, Bld: 102 mg/dL — ABNORMAL HIGH (ref 70–99)
Potassium: 3.7 mmol/L (ref 3.5–5.1)
Sodium: 137 mmol/L (ref 135–145)

## 2022-03-27 MED ORDER — AMOXICILLIN-POT CLAVULANATE 875-125 MG PO TABS: 1.0000 | ORAL_TABLET | Freq: Two times a day (BID) | ORAL | 0 refills | Status: AC

## 2022-03-27 MED ORDER — ALIGN 4 MG PO CAPS: 1.0000 | ORAL_CAPSULE | Freq: Every day | ORAL | Status: AC

## 2022-03-27 MED ORDER — POLYETHYLENE GLYCOL 3350 17 G PO PACK: 17.0000 g | PACK | Freq: Two times a day (BID) | ORAL | 0 refills | Status: AC

## 2022-03-27 NOTE — Progress Notes (Signed)
Mobility Specialist - Progress Note   03/27/22 0955  Mobility  HOB Elevated/Bed Position Self regulated  Activity Ambulated independently in hallway  Range of Motion/Exercises Active  Level of Assistance Independent  Assistive Device None  Distance Ambulated (ft) 500 ft  Activity Response Tolerated well  Transport method Ambulatory  $Mobility charge 1 Mobility   Pt received in chair w/ wife in room and agreeable to mobility. No complaints during ambulation. Pt to chair after session with all needs met & call bell in reach.   [facility]

## 2022-03-27 NOTE — Progress Notes (Signed)
Mobility Specialist - Progress Note   03/27/22 1456  Mobility  HOB Elevated/Bed Position Self regulated  Activity Ambulated independently in hallway  Range of Motion/Exercises Active  Level of Assistance Independent  Assistive Device None  Distance Ambulated (ft) 500 ft  Activity Response Tolerated well  Transport method Ambulatory  $Mobility charge 1 Mobility   Pt received in bed and agreeable to mobility. No complaints during ambulation. Pt to bed after session with all needs met.    [name]  Mobility Specialist  

## 2022-03-27 NOTE — Progress Notes (Signed)
Patient discharged to home with family, discharge instructions reviewed with patient who verbalized understanding. 

## 2022-03-27 NOTE — Progress Notes (Signed)
[facility] ROUNDING NOTE   Subjective: Had a few bowel movements yesterday/overnight.  Was started on ABX yesterday for WBC 19.5.  Otherwise reports feeling better today.  Tolerating low residue diet.  KUB yesterday without any retained stool.   Objective: Vital signs in last 24 hours: Temp:  [98 F (36.7 C)-98.1 F (36.7 C)] 98.1 F (36.7 C) (09/30 0419) Pulse Rate:  [58-67] 58 (09/30 0419) Resp:  [15-18] 15 (09/30 0419) BP: (127-150)/(55-65) 127/55 (09/30 0419) SpO2:  [95 %-96 %] 95 % (09/30 0419) Last BM Date : 03/26/22 General: NAD Abdomen: Soft, NT, ND, +BS Ext:  No c/c/e    Intake/Output from previous day: 09/29 0701 - 09/30 0700 In: 240 [P.O.:240] Out: -  Intake/Output this shift: No intake/output data recorded.   Lab Results: Recent Labs    03/25/22 0253 03/26/22 0622 03/27/22 0659  WBC 13.2* 19.5* 17.6*  HGB 12.1* 13.5 11.8*  PLT 309 402* 321  MCV 97.9 97.2 96.8   BMET Recent Labs    03/25/22 0253 03/26/22 0622 03/27/22 0659  NA 141 137 137  K 3.9 3.9 3.7  CL 113* 106 107  CO2 22 20* 22  GLUCOSE 97 151* 102*  BUN 35* 30* 32*  CREATININE 2.21* 2.32* 2.51*  CALCIUM 8.3* 8.8* 8.3*   LFT Recent Labs    03/25/22 0253  PROT 5.7*  ALBUMIN 3.0*  AST 20  ALT 11  ALKPHOS 64  BILITOT 0.7   PT/INR No results for input(s): "INR" in the last 72 hours.    Imaging/Other results: DG Abd 2 Views  Result Date: 03/26/2022 CLINICAL DATA:  Abdominal pain.  Sigmoid colonic narrowing by CT. EXAM: ABDOMEN - 2 VIEW COMPARISON:  CT of the abdomen and pelvis on 03/23/2022 FINDINGS: There remains some gaseous distension of primarily the ascending and transverse colon with maximum diameter of approximately 9 cm at the level of the cecum. No evidence of free intraperitoneal air or significant small bowel dilatation. No abnormal calcifications. IMPRESSION: Persistent gaseous distension of primarily the ascending and transverse colon with maximum  diameter of approximately 9 cm at the level of the cecum. Electronically Signed   By: Aletta Edouard M.D.   On: 03/26/2022 10:48      Assessment and Plan:  1) Left-sided colonic stricture 2) Diverticulosis 3) Acute on Chronic constipation  Continues to improve clinically.  Passing stools.  No abdominal pain.  Unclear if WBC 19.5 yesterday was a mild diverticulitis, but downtrending today after starting antibiotics.  I had a long conversation with the patient, his wife at bedside, and daughter by phone today. - Continue MiraLAX bid for the time being.  If having watery stools after discharge, to can titrate to goal of soft stools, but certainly want keep stools on the loose side due to known colonic stricture - Low residue diet.  Provided detailed instruction regarding low residue diet today - Start probiotic as an outpatient and continue for 4 weeks beyond completion of antibiotic course - Reasonable to continue Augmentin x7 days total - Start Benefiber as outpatient - Encouraged to drink 64 ounces of water/day - Provided specific recommendations regarding parameters to call the office or return to ER - Will arrange for outpatient follow-up with his primary Gastroenterologist, Dr. Rush Landmark  4) Leukocytosis - Improving - As above, continuing Augmentin x7 days total - Start probiotic course as above  Inpatient GI service will sign off at this time in anticipation of discharge to [facility] later today.   [employer]  V Lamoine Fredricksen, DO  03/27/2022, 1:29 PM [facility] Pager [phone]

## 2022-03-27 NOTE — Discharge Summary (Signed)
Physician Discharge Summary  HERALD VALLIN LZJ:[id number] DOB: 1928-02-24 DOA: 03/23/2022  PCP: Binnie Rail, MD  Admit date: 03/23/2022 Discharge date: 03/27/2022  Time spent: 60 minutes  Recommendations for Outpatient Follow-up:  Follow-up with Dr. Rush Landmark 04/01/2022. Follow-up with Binnie Rail, MD in 2 weeks.  On follow-up patient's blood pressure need to be reassessed.  Patient will need a basic metabolic profile done to follow-up on electrolytes and renal function.  Patient will need a CBC done to follow-up on leukocytosis.  Patient will need repeat CT scan set up in 12 months to follow-up on lung nodule noted.   Discharge Diagnoses:  Principal Problem:   Abnormal CT scan, sigmoid colon Active Problems:   Chronic constipation   Diverticulitis of colon   Dyslipidemia   HTN (hypertension)   Hypothyroidism   Chronic kidney disease (CKD), stage III (moderate) (HCC)   Colitis   Bloating   Abdominal distention   Diverticulosis of colon without hemorrhage   Discharge Condition: Stable and improved  Diet recommendation: Low residue diet  Filed Weights   03/23/22 0959  Weight: 84.4 kg    History of present illness:  HPI per Dr. Bernette Mayers is a 88 y.o. male with medical history significant of  chronic constipation, diverticulosis, hiatal hernia, hypothyroidism, OSA, HTN, HLD, glaucoma,anxiety OSA, hx of PUD, prostate CA s/p prostatectomy, CRI, Anemia nos , hx of difficult colonoscopy due to segment of stenosis of left colon.  Patient follow with gi for chronic constipation and around 3 weeks ago started to experience increase symptoms. He followed up with gi and employed suppositories, increasing Linzess however symptoms did not improve due persistent symptoms her presented to ED for further evaluation of abdominal pain and obstipation. Patient notes no fever/ chills/ n/v/d/sob/chest pain. He notes abdominal pain intermittently , however currently he states he has  no abdominal pain.     ED Course:  afeb, bp 135/71, hr 63, sat 99%  Labs Wbc 15, hgb 13.2 at baseline, plt 356, Na 140, K 4.4, cr 2.7 ( 2.3-2.6) UA neg ,+ protein  Ctabd/pelvis IMPRESSION: There appears to be at least severe obstruction or near occlusion in the sigmoid colon with associated severe wall thickening, concerning for colitis or possibly colonic malignancy. Large amount of stool seen in the more proximal colon. There may be some component of diverticulitis as well. Sigmoidoscopy is recommended for further evaluation.   Moderate size sliding-type hiatal hernia.   4 mm nodule noted in right lower lobe. Although likely benign, if the patient is high-risk, given the morphology and/or location of this nodule a non-contrast chest CT can be considered in 12 Months.  Hospital Course:  #1 chronic constipation/CT findings of severe obstruction near occlusion in the sigmoid colon with associated severe wall thickening/large amount of stool more proximal colon possible component of diverticulitis -Patient seen in consultation by GI who felt unlikely malignancy given the long segment of narrowing and noted to have had severe left colon stenosis on colonoscopy in 2019. -Per GI few patient may have a component of acute diverticulitis in addition to constipation. -Patient with bowel movements over the past 4 days with clinical improvement on IV antibiotics. -Patient remained afebrile, initial leukocytosis trended down, then trended back up however was trending back down on day of discharge.   -Patient nontoxic looking.  -KUB done on 03/26/2022 after leukocytosis trended back up, with some persistent gaseous distention of primary ascending and transverse colon with maximum diameter of approximately 9 cm  at the level of the cecum.  Patient known to have chronic sigmoid stricture likely causing the proximal colonic dilatation. -Patient initially started on clear liquids, diet advanced to a  low respiratory diet which he tolerated.  -Noted to have left lower quadrant abdominal pain on 03/24/2022, which improved after bowel movement. -Patient maintained on IV Unasyn, MiraLAX twice daily during the hospitalization with continued clinical improvement.   -Patient will be discharged home on MiraLAX twice daily, Augmentin x4 more days to complete a 7-day course of treatment, probiotic, Benefiber.   -Patient will follow-up in the outpatient setting with primary gastroenterologist, Dr. Rush Landmark.   2.  4 mm right lower lobe lung nodule on CT scan -Outpatient follow-up. -Will likely need repeat CT scan in 12 months.   3.  Hypothyroidism -Patient maintained on home regimen Synthroid.     4.  Hypertension -During the hospitalization patient's home regimen Norvasc increased to 10 mg daily.   -Suspect elevated blood pressure during the hospitalization was in part secondary to pain.   -Patient maintained on IV hydralazine as needed.   -Patient be discharged back on home regimen of Norvasc 5 mg daily, outpatient follow-up with PCP.     5.  CKD stage IIIb -Remained stable during the hospitalization creatinine was 2.51 by day of discharge.   -Outpatient follow-up with PCP.     6.  Anemia -Hemoglobin remained stable during the hospitalization and was 11.8 by day of discharge. -Outpatient follow-up.   7.  Hyperlipidemia -Statin was held during the hospitalization and will be resumed on discharge.    8.  Depression -Patient maintained on home regimen Lexapro.    Procedures: CT abdomen and pelvis 03/23/2022.  Consultations: Gastroenterology: Dr. Rush Landmark 03/23/2022  Discharge Exam: Vitals:   03/27/22 0419 03/27/22 1441  BP: (!) 127/55 (!) 146/67  Pulse: (!) 58 68  Resp: 15 18  Temp: 98.1 F (36.7 C) 98.2 F (36.8 C)  SpO2: 95% 100%    General: NAD Cardiovascular: RRR no murmurs rubs or gallops.  No JVD.  No lower extremity edema. Respiratory: Clear to auscultation  bilaterally.  No wheezes, no crackles, no rhonchi.  Fair air movement.  Speaking in full sentences.  Discharge Instructions   Discharge Instructions     Diet general   Complete by: As directed    Low residue diet   Increase activity slowly   Complete by: As directed       Allergies as of 03/27/2022       Reactions   Sulfa Antibiotics    Other reaction(s): RASH,PAPULAR   Sulfonamide Derivatives    REACTION: Itch/Red Spots        Medication List     TAKE these medications    Align 4 MG Caps Take 1 capsule (4 mg total) by mouth daily.   amLODipine 5 MG tablet Commonly known as: NORVASC Take 1 tablet (5 mg total) by mouth daily.   amoxicillin-clavulanate 875-125 MG tablet Commonly known as: AUGMENTIN Take 1 tablet by mouth 2 (two) times daily for 4 days.   escitalopram 5 MG tablet Commonly known as: Lexapro Take 1 tablet (5 mg total) by mouth daily.   ferrous sulfate 325 (65 FE) MG EC tablet Take 1 tablet (325 mg total) by mouth every other day.   FiberCon 625 MG tablet Generic drug: polycarbophil Take 625 mg by mouth daily.   furosemide 40 MG tablet Commonly known as: LASIX Take 1 tablet by mouth as needed for fluid retention (once weekly  on Sundays).   levothyroxine 50 MCG tablet Commonly known as: SYNTHROID Take 1 tablet (50 mcg total) by mouth daily.   polyethylene glycol 17 g packet Commonly known as: MIRALAX / GLYCOLAX Take 17 g by mouth 2 (two) times daily. Take 1 to 2 capfuls daily at noon What changed: when to take this   rosuvastatin 40 MG tablet Commonly known as: CRESTOR TAKE ONE TABLET BY MOUTH EVERY DAY   traZODone 100 MG tablet Commonly known as: DESYREL TAKE ONE TABLET AT BEDTIME.   Vitamin D 50 MCG (2000 UT) Caps Take 2 capsules by mouth daily.       Allergies  Allergen Reactions   Sulfa Antibiotics     Other reaction(s): RASH,PAPULAR   Sulfonamide Derivatives     REACTION: Itch/Red Spots    Follow-up Information      Mansouraty, Telford Nab., MD Follow up on 04/01/2022.   Specialties: Gastroenterology, Internal Medicine Why: Go to basement for labs( hepatic function panel) on 04/01/22. Contact information: [place] [address] [phone]         Binnie Rail, MD. Schedule an appointment as soon as possible for a visit in 2 week(s).   Specialty: Internal Medicine Contact information: [place] [address] [phone]                  The results of significant diagnostics from this hospitalization (including imaging, microbiology, ancillary and laboratory) are listed below for reference.    Significant Diagnostic Studies: DG Abd 2 Views  Result Date: 03/26/2022 CLINICAL DATA:  Abdominal pain.  Sigmoid colonic narrowing by CT. EXAM: ABDOMEN - 2 VIEW COMPARISON:  CT of the abdomen and pelvis on 03/23/2022 FINDINGS: There remains some gaseous distension of primarily the ascending and transverse colon with maximum diameter of approximately 9 cm at the level of the cecum. No evidence of free intraperitoneal air or significant small bowel dilatation. No abnormal calcifications. IMPRESSION: Persistent gaseous distension of primarily the ascending and transverse colon with maximum diameter of approximately 9 cm at the level of the cecum. Electronically Signed   By: Aletta Edouard M.D.   On: 03/26/2022 10:48   CT ABDOMEN PELVIS WO CONTRAST  Result Date: 03/23/2022 CLINICAL DATA:  Acute left lower quadrant abdominal pain. EXAM: CT ABDOMEN AND PELVIS WITHOUT CONTRAST TECHNIQUE: Multidetector CT imaging of the abdomen and pelvis was performed following the standard protocol without IV contrast. RADIATION DOSE REDUCTION: This exam was performed according to the departmental dose-optimization program which includes automated exposure control, adjustment of the mA and/or kV according to patient size and/or use of iterative reconstruction technique. COMPARISON:  None  Available. FINDINGS: Lower chest: Moderate size sliding-type hiatal hernia is noted. 4 mm nodule is noted posteriorly in the right lower lobe. Hepatobiliary: No focal liver abnormality is seen. No gallstones, gallbladder wall thickening, or biliary dilatation. Pancreas: Unremarkable. No pancreatic ductal dilatation or surrounding inflammatory changes. Spleen: Normal in size without focal abnormality. Adrenals/Urinary Tract: Adrenal glands are unremarkable. Kidneys are normal, without renal calculi, focal lesion, or hydronephrosis. Bladder is unremarkable. Stomach/Bowel: Moderate size sliding-type hiatal hernia is noted above. No small bowel dilatation or inflammation is noted. Large amount of stool seen throughout the colon which extends to the sigmoid colon where there is severe wall thickening and severe obstruction. Stool is seen in the more distal sigmoid colon and rectum. Diverticulosis is noted in this area. This is highly concerning for malignancy, although some component of colitis or  diverticulitis may be present as well. Vascular/Lymphatic: Aortic atherosclerosis. No enlarged abdominal or pelvic lymph nodes. Reproductive: Status post prostatectomy. Other: No abdominal wall hernia or abnormality. No abdominopelvic ascites. Musculoskeletal: No acute or significant osseous findings. IMPRESSION: There appears to be at least severe obstruction or near occlusion in the sigmoid colon with associated severe wall thickening, concerning for colitis or possibly colonic malignancy. Large amount of stool seen in the more proximal colon. There may be some component of diverticulitis as well. Sigmoidoscopy is recommended for further evaluation. Moderate size sliding-type hiatal hernia. 4 mm nodule noted in right lower lobe. Although likely benign, if the patient is high-risk, given the morphology and/or location of this nodule a non-contrast chest CT can be considered in 12 months.This recommendation follows the  consensus statement: Guidelines for Management of Incidental Pulmonary Nodules Detected on CT Images: From the Fleischner Society 2017; Radiology 2017; 284:228-243. Aortic Atherosclerosis (ICD10-I70.0). Electronically Signed   By: Marijo Conception M.D.   On: 03/23/2022 12:01    Microbiology: No results found for this or any previous visit (from the past 240 hour(s)).   Labs: Basic Metabolic Panel: Recent Labs  Lab 03/23/22 1059 03/24/22 0554 03/25/22 0253 03/26/22 0622 03/27/22 0659  NA 140 141 141 137 137  K 4.4 3.9 3.9 3.9 3.7  CL 105 111 113* 106 107  CO2 '24 24 22 '$ 20* 22  GLUCOSE 119* 102* 97 151* 102*  BUN 52* 40* 35* 30* 32*  CREATININE 2.71* 2.25* 2.21* 2.32* 2.51*  CALCIUM 9.3 8.5* 8.3* 8.8* 8.3*  MG  --   --  1.8 2.2 2.1   Liver Function Tests: Recent Labs  Lab 03/23/22 1059 03/24/22 0554 03/25/22 0253  AST '18 20 20  '$ ALT '10 12 11  '$ ALKPHOS 77 69 64  BILITOT 0.6 0.6 0.7  PROT 7.0 6.0* 5.7*  ALBUMIN 4.3 3.2* 3.0*   No results for input(s): "LIPASE", "AMYLASE" in the last 168 hours. No results for input(s): "AMMONIA" in the last 168 hours. CBC: Recent Labs  Lab 03/23/22 1059 03/24/22 0554 03/25/22 0253 03/26/22 0622 03/27/22 0659  WBC 15.0* 14.2* 13.2* 19.5* 17.6*  NEUTROABS 8.2*  --  7.1 17.6* 10.5*  HGB 13.2 12.5* 12.1* 13.5 11.8*  HCT 40.6 38.7* 37.1* 41.9 36.4*  MCV 95.8 97.7 97.9 97.2 96.8  PLT 356 307 309 402* 321   Cardiac Enzymes: No results for input(s): "CKTOTAL", "CKMB", "CKMBINDEX", "TROPONINI" in the last 168 hours. BNP: BNP (last 3 results) No results for input(s): "BNP" in the last 8760 hours.  ProBNP (last 3 results) No results for input(s): "PROBNP" in the last 8760 hours.  CBG: No results for input(s): "GLUCAP" in the last 168 hours.     Signed:  Irine Seal MD.  [facility] 03/27/2022, 2:51 PM

## 2022-03-29 ENCOUNTER — Telehealth: Payer: Self-pay

## 2022-03-29 NOTE — Telephone Encounter (Signed)
-----   Message from Lavena Bullion, DO sent at 03/27/2022  1:35 PM EDT ----- Patient of Dr. Rush Landmark.  I anticipate patient will be discharged home later today or tomorrow morning.  Please arrange for outpatient follow-up with Dr. Rush Landmark or APP in the next 3-5 weeks or so.  Thank you.

## 2022-03-29 NOTE — Telephone Encounter (Signed)
Appt made with GM on 05/12/22 at 930 am  Letter mailed to the pt home

## 2022-03-30 ENCOUNTER — Other Ambulatory Visit (HOSPITAL_BASED_OUTPATIENT_CLINIC_OR_DEPARTMENT_OTHER): Payer: Self-pay

## 2022-03-30 MED ORDER — FLUAD QUADRIVALENT 0.5 ML IM PRSY
PREFILLED_SYRINGE | INTRAMUSCULAR | 0 refills | Status: DC
  Filled 2022-03-30: qty 0.5, 1d supply, fill #0

## 2022-03-31 ENCOUNTER — Telehealth: Payer: Self-pay | Admitting: *Deleted

## 2022-03-31 ENCOUNTER — Other Ambulatory Visit: Payer: PPO

## 2022-03-31 ENCOUNTER — Encounter: Payer: Self-pay | Admitting: *Deleted

## 2022-03-31 ENCOUNTER — Encounter: Payer: Self-pay | Admitting: Gastroenterology

## 2022-03-31 ENCOUNTER — Ambulatory Visit (INDEPENDENT_AMBULATORY_CARE_PROVIDER_SITE_OTHER): Payer: PPO | Admitting: Gastroenterology

## 2022-03-31 VITALS — BP 154/70 | HR 63 | Ht 71.0 in | Wt 192.0 lb

## 2022-03-31 DIAGNOSIS — K5909 Other constipation: Secondary | ICD-10-CM

## 2022-03-31 DIAGNOSIS — D72829 Elevated white blood cell count, unspecified: Secondary | ICD-10-CM | POA: Diagnosis not present

## 2022-03-31 DIAGNOSIS — R933 Abnormal findings on diagnostic imaging of other parts of digestive tract: Secondary | ICD-10-CM

## 2022-03-31 DIAGNOSIS — K5732 Diverticulitis of large intestine without perforation or abscess without bleeding: Secondary | ICD-10-CM

## 2022-03-31 NOTE — Patient Outreach (Signed)
  Care Coordination [facility] Note Transition Care Management Follow-up Telephone Call Date of discharge and from where: Saturday, 9/30/23[facility]; colitis How have you been since you were released from the hospital? Per spouse/ caregiver [name], on [facility] DPR: "He is doing fine; I am able to help him with anything he needs help with, and we saw the GI doctor this morning; he is able to do most everything himself, but I am here to help if he were to need something.  We have his medications and he is taking them all the way he was told to" Any questions or concerns? No  Items Reviewed: Did the pt receive and understand the discharge instructions provided? Yes  Medications obtained and verified? Yes  Other? No  Any new allergies since your discharge? No  Dietary orders reviewed? Yes Do you have support at home? Yes  wife/ caregiver assists as needed/ indicated  Home Care and Equipment/Supplies: Were home health services ordered? no If so, what is the name of the agency? N/A  Has the agency set up a time to come to the patient's home? not applicable Were any new equipment or medical supplies ordered?  No What is the name of the medical supply agency? N/A Were you able to get the supplies/equipment? not applicable Do you have any questions related to the use of the equipment or supplies? No N/A  Functional Questionnaire: (I = Independent and D = Dependent) ADLs: I  wife/ caregiver assists as needed/ indicated  Bathing/Dressing- I  Meal Prep- I  wife/ caregiver assists as needed/ indicated  Eating- I  Maintaining continence- I  Transferring/Ambulation- I  Managing Meds- I  wife/ caregiver assists as needed/ indicated  Follow up appointments reviewed:  PCP Hospital f/u appt confirmed? Yes  Scheduled to see PCP, Dr. Quay Richardson on Friday, 04/16/22 @ 10:40 am Specialist Hospital f/u appt confirmed? Yes  Scheduled to see GI specialist on today 03/31/22-- confirmed patient attended  visit as scheduled  Are transportation arrangements needed? No  If their condition worsens, is the pt aware to call PCP or go to the Emergency Dept.? Yes Was the patient provided with contact information for the PCP's office or ED? Yes Was to pt encouraged to call back with questions or concerns? Yes  SDOH assessments and interventions completed:   Yes  Care Coordination Interventions Activated:  Yes   Care Coordination Interventions:  Reviewed post-office visit instructions from GI specialist visit this morning with caregiver    Encounter Outcome:  Pt. Visit Completed    Oneta Rack, RN, BSN, CCRN Alumnus RN CM Care Coordination/ Transition of [place] Management [phone]: direct office

## 2022-03-31 NOTE — Patient Instructions (Signed)
Your provider has requested that you go to the basement level for lab work before leaving today. Press "B" on the elevator. The lab is located at the first door on the left as you exit the elevator.  You have been scheduled for a CT scan of the abdomen and pelvis at [facility]. You are scheduled on 04/29/22 at 5:00pm. You should arrive 15 minutes prior to your appointment time for registration.  We are giving you 2 bottles of contrast today that you will need to drink before arriving for the exam. The solution may taste better if refrigerated so put them in the refrigerator when you get home, but do NOT add ice or any other liquid to this solution as that would dilute it. Shake well before drinking.   Please follow the written instructions below on the day of your exam:   1) Do not eat anything after 1:00pm (4 hours prior to your test)   2) Drink 1 bottle of contrast @ 3:00 pm (2 hours prior to your exam)  Remember to shake well before drinking and do NOT pour over ice.     Drink 1 bottle of contrast @ 4:00 pm (1 hour prior to your exam)   You may take any medications as prescribed with a small amount of water, if necessary. If you take any of the following medications: METFORMIN, GLUCOPHAGE, GLUCOVANCE, AVANDAMET, RIOMET, FORTAMET, [place] MET, JANUMET, GLUMETZA or METAGLIP, you MAY be asked to HOLD this medication 48 hours AFTER the exam.   The purpose of you drinking the oral contrast is to aid in the visualization of your intestinal tract. The contrast solution may cause some diarrhea. Depending on your individual set of symptoms, you may also receive an intravenous injection of x-ray contrast/dye. Plan on being at [facility] for 45 minutes or longer, depending on the type of exam you are having performed.   If you have any questions regarding your exam or if you need to reschedule, you may call [facility] at [phone] between the hours of 8:00 am and 5:00 pm,  Monday-Friday.   Thank you for choosing me and [facility].  Dr. Rush Landmark

## 2022-04-01 ENCOUNTER — Encounter: Payer: Self-pay | Admitting: Gastroenterology

## 2022-04-01 ENCOUNTER — Encounter: Payer: Self-pay | Admitting: Internal Medicine

## 2022-04-01 ENCOUNTER — Other Ambulatory Visit (INDEPENDENT_AMBULATORY_CARE_PROVIDER_SITE_OTHER): Payer: PPO

## 2022-04-01 DIAGNOSIS — D72829 Elevated white blood cell count, unspecified: Secondary | ICD-10-CM

## 2022-04-01 DIAGNOSIS — K5909 Other constipation: Secondary | ICD-10-CM

## 2022-04-01 DIAGNOSIS — K5732 Diverticulitis of large intestine without perforation or abscess without bleeding: Secondary | ICD-10-CM | POA: Diagnosis not present

## 2022-04-01 LAB — BASIC METABOLIC PANEL WITH GFR
BUN: 36 mg/dL — ABNORMAL HIGH (ref 6–23)
CO2: 25 meq/L (ref 19–32)
Calcium: 8.9 mg/dL (ref 8.4–10.5)
Chloride: 103 meq/L (ref 96–112)
Creatinine, Ser: 2.54 mg/dL — ABNORMAL HIGH (ref 0.40–1.50)
GFR: 21.34 mL/min — ABNORMAL LOW
Glucose, Bld: 87 mg/dL (ref 70–99)
Potassium: 4.4 meq/L (ref 3.5–5.1)
Sodium: 138 meq/L (ref 135–145)

## 2022-04-01 LAB — CBC WITH DIFFERENTIAL/PLATELET
Basophils Absolute: 0 10*3/uL (ref 0.0–0.1)
Basophils Relative: 0.3 % (ref 0.0–3.0)
Eosinophils Absolute: 0 10*3/uL (ref 0.0–0.7)
Eosinophils Relative: 0 % (ref 0.0–5.0)
HCT: 37.4 % — ABNORMAL LOW (ref 39.0–52.0)
Hemoglobin: 12.2 g/dL — ABNORMAL LOW (ref 13.0–17.0)
Lymphocytes Relative: 42.1 % (ref 12.0–46.0)
Lymphs Abs: 6.7 10*3/uL — ABNORMAL HIGH (ref 0.7–4.0)
MCHC: 32.5 g/dL (ref 30.0–36.0)
MCV: 95.9 fl (ref 78.0–100.0)
Monocytes Absolute: 2 10*3/uL — ABNORMAL HIGH (ref 0.1–1.0)
Monocytes Relative: 12.3 % — ABNORMAL HIGH (ref 3.0–12.0)
Neutro Abs: 7.2 10*3/uL (ref 1.4–7.7)
Neutrophils Relative %: 45.3 % (ref 43.0–77.0)
Platelets: 343 10*3/uL (ref 150.0–400.0)
RBC: 3.9 Mil/uL — ABNORMAL LOW (ref 4.22–5.81)
RDW: 15.4 % (ref 11.5–15.5)
WBC: 15.9 10*3/uL — ABNORMAL HIGH (ref 4.0–10.5)

## 2022-04-01 NOTE — Progress Notes (Signed)
[facility] VISIT   Primary Care Provider Binnie Rail, MD [place] [address] [phone]  Patient Profile: [name] is a 88 y.o. male with a pmh significant for Diverticulosis, Prior Colon Polyps, incomplete colonoscopy, HTN, HLD, Cholelithiasis, MDD/Anxiety, DM, GERD, Prostate Cancer, OSA, Glaucoma, CRI, Lactose Intolerance, diverticulosis (recent diverticulitis), chronic constipation.  The patient presents to the [facility] for an evaluation and management of problem(s) noted below:  Problem List 1. Diverticulitis of colon   2. Chronic constipation   3. Abnormal CT scan, sigmoid colon   4. Abnormal colonoscopy   5. Leukocytosis, unspecified type      History of Present Illness: Please see prior notes for full details of HPI..  Interval History: The patient is seen in follow-up.  He was recently admitted to the hospital while I was on service.  He presented with constipation and imaging findings concerning for a partial large bowel obstruction with evidence of colitis/diverticulitis versus underlying masslike abnormality.  The patient had not been taking his laxative therapy as he should have in the days prior.  He became significantly obstipated.  He was treated with antibiotic therapy and has completed a 10-day course of antibiotics.  He is continued on his laxative therapy and is doing quite well at this point.  He is accompanied today by his wife.  He continues to take MiraLAX twice daily and 1 dose of fiber daily.  Otherwise the patient has not noted any blood in his stools.  His significant abdominal discomfort that he had while in the hospital has not recurred.  His bowels are loose but moving.  He is not having any fevers or chills.  His blood pressure has been slightly higher per notation from his wife who has been monitoring things.  It looks like he had been on an increased dose of amlodipine while  in-house (in the hospital) but then was discharged back on his normal amlodipine dosing.  GI Review of Systems Positive as above Negative for dysphagia, odynophagia, nausea, vomiting, melena, hematochezia  Review of Systems General: Denies fevers/chills/unintentional weight loss Cardiovascular: Denies chest pain Pulmonary: Denies shortness of breath Gastroenterological: See HPI Dermatological: Denies jaundice Psychological: Mood is stable   Medications Current Outpatient Medications  Medication Sig Dispense Refill   amLODipine (NORVASC) 5 MG tablet Take 1 tablet (5 mg total) by mouth daily. 90 tablet 3   Cholecalciferol (VITAMIN D) 2000 UNITS CAPS Take 2 capsules by mouth daily.     escitalopram (LEXAPRO) 5 MG tablet Take 1 tablet (5 mg total) by mouth daily. 30 tablet 5   ferrous sulfate 325 (65 FE) MG EC tablet Take 1 tablet (325 mg total) by mouth every other day. 15 tablet 3   influenza vaccine adjuvanted (FLUAD QUADRIVALENT) 0.5 ML injection Inject into the muscle. 0.5 mL 0   levothyroxine (SYNTHROID) 50 MCG tablet Take 1 tablet (50 mcg total) by mouth daily. 90 tablet 3   polycarbophil (FIBERCON) 625 MG tablet Take 625 mg by mouth daily.     polyethylene glycol (MIRALAX / GLYCOLAX) 17 g packet Take 17 g by mouth 2 (two) times daily. Take 1 to 2 capfuls daily at noon 14 each 0   Probiotic Product (ALIGN) 4 MG CAPS Take 1 capsule (4 mg total) by mouth daily.     rosuvastatin (CRESTOR) 40 MG tablet TAKE ONE TABLET BY MOUTH EVERY DAY 90 tablet 0   traZODone (DESYREL) 100 MG tablet TAKE ONE TABLET  AT BEDTIME. 30 tablet 11   furosemide (LASIX) 40 MG tablet Take 1 tablet by mouth as needed for fluid retention (once weekly on Sundays). (Patient not taking: Reported on 03/31/2022) 10 tablet 3   No current facility-administered medications for this visit.    Allergies Allergies  Allergen Reactions   Sulfa Antibiotics     Other reaction(s): RASH,PAPULAR   Sulfonamide Derivatives      REACTION: Itch/Red Spots    Histories Past Medical History:  Diagnosis Date   Anxiety    BRADYCARDIA 11/13/2008   Cataract    Cholelithiasis    COLONIC POLYPS, HX OF 02/04/2007   DEPRESSION 02/04/2007   DIABETES MELLITUS, TYPE II 02/04/2007   DIVERTICULOSIS, COLON 02/04/2007   FOREIGN BODY, ASPIRATION 12/04/2007   GERD (gastroesophageal reflux disease)    GLAUCOMA 03/08/2009   Hepatitis A 1963   HYPERLIPIDEMIA 12/04/2007   HYPERTENSION 02/04/2007   Memory loss    OBSTRUCTIVE SLEEP APNEA 11/05/2009   -PSG 01/05/10 RDI 11, PLMI 56   OSTEOARTHRITIS 02/04/2007   Peptic stricture of esophagus    PROSTATE CANCER 1997   RENAL INSUFFICIENCY 11/13/2008   SYNCOPE 12/31/2009   THROMBOCYTOPENIA 04/17/2008   THYROID NODULE, RIGHT 11/13/2008   UNSPECIFIED ANEMIA 11/07/2007   Past Surgical History:  Procedure Laterality Date   Heart Cartherization  2000   PROSTATECTOMY  1997   TONSILLECTOMY  1940's   Social History   Socioeconomic History   Marital status: Married    Spouse name: [name]   Number of children: 3   Years of education: college   Highest education level: Bachelor's degree (e.g., BA, AB, BS)  Occupational History   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   Right-handed.   2 cups caffeine per day.      Social Determinants of Health   Financial Resource Strain: Low Risk  (11/09/2021)   Overall Financial Resource Strain (CARDIA)    Difficulty of Paying Living Expenses: Not hard at all  Food Insecurity: No Food Insecurity  (03/31/2022)   Hunger Vital Sign    Worried About Running Out of Food in the Last Year: Never true    Ran Out of Food in the Last Year: Never true  Transportation Needs: No Transportation Needs (03/31/2022)   PRAPARE - Hydrologist (Medical): No    Lack of Transportation (Non-Medical): No  Physical Activity: Sufficiently Active (11/09/2021)   Exercise Vital Sign    Days of Exercise per Week: 6 days    Minutes of Exercise per Session: 30 min  Stress: No Stress Concern Present (11/09/2021)   [place]    Feeling of Stress : Not at all  Social Connections: [place] (11/09/2021)   Social Connection and Isolation Panel [NHANES]    Frequency of Communication with Friends and Family: More than three times a week    Frequency of Social Gatherings with  Friends and Family: More than three times a week    Attends Religious Services: More than 4 times per year    Active Member of Clubs or Organizations: Yes    Attends Music therapist: More than 4 times per year    Marital Status: Married  Human resources officer Violence: Not At Risk (11/09/2021)   Humiliation, Afraid, Rape, and Kick questionnaire    Fear of Current or Ex-Partner: No    Emotionally Abused: No    Physically Abused: No    Sexually Abused: No   Family History  Problem Relation Age of Onset   Kidney disease Father        Bright's Disease - died at age 33   Other Mother        no signifincant health problems - died at age 51   Heart disease Neg Hx        No FH of Coronary Artery Disease   Cancer Neg Hx    Colon cancer Neg Hx    Esophageal cancer Neg Hx    Inflammatory bowel disease Neg Hx    Liver disease Neg Hx    Pancreatic cancer Neg Hx    Rectal cancer Neg Hx    Stomach cancer Neg Hx    I have reviewed his medical, social, and family history in detail and updated the electronic medical record as necessary.     PHYSICAL EXAMINATION  BP (!) 154/70   Pulse 63   Ht '5\' 11"'$  (1.803 m)   Wt 192 lb (87.1 kg)   BMI 26.78 kg/m  Wt Readings from Last 3 Encounters:  03/31/22 192 lb (87.1 kg)  03/23/22 186 lb (84.4 kg)  03/17/22 193 lb (87.5 kg)  GEN: NAD, appears stated age, doesn't appear chronically ill, accompanied by wife PSYCH: Cooperative, without pressured speech EYE: Conjunctivae pink, sclerae anicteric ENT: MMM CV: Nontachycardic RESP: No audible wheezing GI: NABS, soft, protuberant abdomen, not as tympanic as when he was in the hospital, distended, nontender, without rebound or guarding MSK/EXT: No lower extremity edema SKIN: No jaundice NEURO:  Alert & Oriented x 3, no focal deficits   REVIEW OF DATA  I reviewed the following data at the time of this encounter:  GI Procedures and Studies  Previously reviewed  Laboratory Studies  Reviewed in epic  Imaging Studies  September 2023 CT abdomen pelvis without contrast IMPRESSION: There appears to be at least severe obstruction or near occlusion in the sigmoid colon with associated severe wall thickening, concerning for colitis or possibly colonic malignancy. Large amount of stool seen in the more proximal colon. There may be some component of diverticulitis as well. Sigmoidoscopy is recommended for further evaluation. Moderate size sliding-type hiatal hernia. 4 mm nodule noted in right lower lobe. Although likely benign, if the patient is high-risk, given the morphology and/or location of this nodule a non-contrast chest CT can be considered in 12 months.This recommendation follows the consensus statement: Guidelines for Management of Incidental Pulmonary Nodules Detected on CT Images: From the Fleischner Society 2017; Radiology 2017; [id number]. Aortic Atherosclerosis (ICD10-I70.0).   ASSESSMENT  Mr. Ledin is a 88 y.o. male with a pmh significant for Diverticulosis, Prior Colon Polyps, incomplete colonoscopy, HTN,  HLD, Cholelithiasis, MDD/Anxiety, DM, GERD, Prostate Cancer, OSA, Glaucoma, CRI, Lactose Intolerance, diverticulosis (recent diverticulitis), chronic constipation.  The patient is seen today for evaluation and management of:  1. Diverticulitis of colon   2. Chronic constipation   3. Abnormal CT scan, sigmoid colon  4. Abnormal colonoscopy   5. Leukocytosis, unspecified type    The patient is hemodynamically and clinically stable at this time.  He has completed a 10-day course of antibiotic therapy for presumed diverticulitis and is taking his laxative therapy and doing well.  I still suspect that this patient has been experiencing issues of chronic constipation and has anatomical issues as a result of the significant narrowing/near stenosis of the left colon in the region of his sigmoid that likely was exacerbated with him not using laxative therapy regularly.  There is always certainly the chance that an underlying mass or lesion could be present but he has not had any blood in his stools and he has no evidence of anemia or iron deficiency.  As well, I was able to traverse this area with a sigmoidoscopy a few years ago and when he had previously been offered a repeat attempt with ultraslim colonoscope in the hospital this was deferred due to his age and otherwise medical comorbidities.  I think that we will hopefully be able to keep from having to do a colonoscopy if imaging findings improve and he continues to be passing his bowels.  We will plan a 4-week follow-up CT scan to ensure things have improved in regards to the inflammation and the overall dilation of the colon.  If there still remains concern at that follow-up CT scan then a high risk colonoscopy attempt does make sense in the hospital-based setting.  Certainly if other things develop or change he may need an earlier procedure but I would like to wait on any colonoscopy for at least 6 weeks from the initiation of antibiotics, if possible.  In  regards to his blood pressure issues, I told the patient and wife to continue to monitor blood pressure and if the systolic begins to creep into the 160s that he will need to discuss this further with Dr. Quay Burow in case the current antihypertensive regimen may need to be adjusted.  They will continue the diary for now.  All patient questions were answered to the best of my ability, and the patient agrees to the aforementioned plan of action with follow-up as indicated.   PLAN  Continue fiber once daily MiraLAX scheduled daily in the morning with a second dose at lunchtime if he has not had a mild bowel movements and may be able to use even a third dose if necessary Continue to promote fluid intake of 64-80 ounces of fluid per day at minimum If above therapies continue to cause patient to have persistent chronic constipation without using the restroom for 2 days then he will need to use 10 mg of oral Dulcolax and then consider repeat utilization of Linzess 72 mcg (had some effect previously when he had samples) Repeat CT abdomen pelvis with IV and oral contrast in 4 weeks Any repeat attempted colonoscopy will need to be done in the hospital-based setting with CO2 and with ultraslim colonoscope and pediatric endoscope availability  Follow-up CBC and BMP from his acute on chronic renal insufficiency and his leukocytosis   Orders Placed This Encounter  Procedures   CT Abdomen Pelvis W Contrast   CBC   Basic Metabolic Panel (BMET)     New Prescriptions   No medications on file   Modified Medications   No medications on file    Planned Follow Up: No follow-ups on file.   Total Time in Face-to-Face and in Coordination of Care for patient including independent/personal interpretation/review of prior testing, medical  history, examination, medication adjustment, communicating results with the patient directly, and documentation with the EHR is 25 minutes.   Justice Britain, MD [facility] Office # [phone]

## 2022-04-02 DIAGNOSIS — D72829 Elevated white blood cell count, unspecified: Secondary | ICD-10-CM | POA: Insufficient documentation

## 2022-04-05 ENCOUNTER — Ambulatory Visit: Payer: PPO | Attending: Internal Medicine

## 2022-04-05 DIAGNOSIS — N1832 Chronic kidney disease, stage 3b: Secondary | ICD-10-CM

## 2022-04-05 DIAGNOSIS — Z79899 Other long term (current) drug therapy: Secondary | ICD-10-CM

## 2022-04-05 LAB — BASIC METABOLIC PANEL
BUN/Creatinine Ratio: 16 (ref 10–24)
BUN: 42 mg/dL — ABNORMAL HIGH (ref 10–36)
CO2: 21 mmol/L (ref 20–29)
Calcium: 9.2 mg/dL (ref 8.6–10.2)
Chloride: 104 mmol/L (ref 96–106)
Creatinine, Ser: 2.62 mg/dL — ABNORMAL HIGH (ref 0.76–1.27)
Glucose: 90 mg/dL (ref 70–99)
Potassium: 4.9 mmol/L (ref 3.5–5.2)
Sodium: 138 mmol/L (ref 134–144)
eGFR: 22 mL/min/{1.73_m2} — ABNORMAL LOW (ref >59)

## 2022-04-05 NOTE — Progress Notes (Unsigned)
Subjective:    Patient ID: [name], male    DOB: 1927/07/05, 88 y.o.   MRN: [id number]     HPI [name] is here for follow up from the hospital.  He is here with his wife who helps provide some of the history given his dementia.  Admitted 9/26 - 9/30  Presented to the ED from [facility] ED  He was experiencing worsening chronic constipation x 3 weeks.  He had seen GI and was started on suppositories, linzess was increased.  He presented with abdominal pain and obstipation.  His abdominal pain was intermittent.  WBC slightly elevated, Cr 2.7, Ct Abd/pelvis - severe obstruction or near occlusion in sigmoid colon with assoc severe wall thickening, concerning for colitis or possible colonic malignancy.  Large amount of stool in proximal colon.  Possible component of diverticulitis.  Moderate sliding type hiatal hernia.  4 mm nodule in RLL, likely benign - if high risk a non-contrast chest CT in 12 months.   Recommendations for Outpatient Follow-up:  Follow-up with Dr. Rush Landmark 04/01/2022. Follow-up with Binnie Rail, MD in 2 weeks.  On follow-up patient's blood pressure need to be reassessed.  Patient will need a basic metabolic profile done to follow-up on electrolytes and renal function.  Patient will need a CBC done to follow-up on leukocytosis.  Patient will need repeat CT scan set up in 12 months to follow-up on lung nodule noted.   Chronic constipation -  Ct scan with severe obstruction, near occlusion in sigmoid colon with severe wall thickening.  Large amount of stool in proximal colon, possible diverticulitis GI saw patient - did not fee obstruction was related to malignancy - he was noted to have severe left colon stenosis on colonoscopy in 2019 Possible diverticulitis in addition to constipation IV abx given Had bowel movements with improvement in symptoms No fevers,  WBC improved, then increased again and then improved Started on clear liquids, diet advanced LLQ pain on  9/27 improved after BM Was on IV unasyn, miralax bid Discharged home on miralax bid, augmentin x 4 more days ( 7 day course total), probiotic, benefiber F/u with Dr Rush Landmark as an outpatient   4 mm RLL nodule on CT: Repeat in 12 months - non contrast chest Ct   Hypothyroidism: Maintained on home synthroid   Hypertension: Home norvasc increased to 10 mg  - likely increased pain cause BP to be elevated Discharged on home meds - norvasc 5 mg daily  CKD, 3b: Stable during hospitalization - Cr 2.51 at discharge   Anemia: H/H stable, 11.8 on discharge  Hyperlipidemia: Statin held during hospitalization, resumed at discharge  Discharged with no LE edema per d/c summary    He saw Dr Rush Landmark on 10/4.  Completed 10 days abx for presumed diverticulitis.  Taking laxatives.  Plan 4 week f/u Ct scan and if stenosis persists may consider colonoscopy.  BP was elevated up to SBP 160's.     He did have blood work done yesterday.  He states he is feeling well.  He does feel better.  His appetite is good and is eating well.  He denies any abdominal pain since leaving the hospital.  He is having watery bowel movements a couple of times a day.   Walking some  - 10-15 min.  Balance ok - unsteady per his wife   He does state some leg swelling.  Wearing compression socks during the day.  He does not always elevate his  legs during the day when he sitting.  He is compliant with a low sodium diet.  He does not take any NSAIDs.   Medications and allergies reviewed with patient and updated if appropriate.  Current Outpatient Medications on File Prior to Visit  Medication Sig Dispense Refill   amLODipine (NORVASC) 5 MG tablet Take 1 tablet (5 mg total) by mouth daily. 90 tablet 3   Cholecalciferol (VITAMIN D) 2000 UNITS CAPS Take 2 capsules by mouth daily.     escitalopram (LEXAPRO) 5 MG tablet Take 1 tablet (5 mg total) by mouth daily. 30 tablet 5   ferrous sulfate 325 (65 FE) MG EC tablet  Take 1 tablet (325 mg total) by mouth every other day. 15 tablet 3   levothyroxine (SYNTHROID) 50 MCG tablet Take 1 tablet (50 mcg total) by mouth daily. 90 tablet 3   polycarbophil (FIBERCON) 625 MG tablet Take 625 mg by mouth daily.     polyethylene glycol (MIRALAX / GLYCOLAX) 17 g packet Take 17 g by mouth 2 (two) times daily. Take 1 to 2 capfuls daily at noon 14 each 0   Probiotic Product (ALIGN) 4 MG CAPS Take 1 capsule (4 mg total) by mouth daily.     rosuvastatin (CRESTOR) 40 MG tablet TAKE ONE TABLET BY MOUTH EVERY DAY 90 tablet 0   traZODone (DESYREL) 100 MG tablet TAKE ONE TABLET AT BEDTIME. 30 tablet 11   erythromycin ophthalmic ointment Place into the left eye 3 (three) times daily.     furosemide (LASIX) 40 MG tablet Take 1 tablet by mouth as needed for fluid retention (once weekly on Sundays). (Patient not taking: Reported on 03/31/2022) 10 tablet 3   No current facility-administered medications on file prior to visit.     Review of Systems  Constitutional:  Negative for appetite change, fatigue and fever.  Respiratory:  Negative for cough, shortness of breath and wheezing.   Cardiovascular:  Negative for chest pain and palpitations.  Gastrointestinal:  Negative for abdominal pain and nausea.       No gerd  Neurological:  Negative for dizziness, light-headedness and headaches.       Objective:   Vitals:   04/06/22 0911  BP: 130/60  Pulse: 60  Temp: 98.1 F (36.7 C)  SpO2: 99%   BP Readings from Last 3 Encounters:  04/06/22 130/60  03/31/22 (!) 154/70  03/27/22 (!) 146/67   Wt Readings from Last 3 Encounters:  04/06/22 185 lb (83.9 kg)  03/31/22 192 lb (87.1 kg)  03/23/22 186 lb (84.4 kg)   Body mass index is 25.8 kg/m.    Physical Exam Constitutional:      General: He is not in acute distress.    Appearance: Normal appearance. He is not ill-appearing.  HENT:     Head: Normocephalic and atraumatic.  Eyes:     Conjunctiva/sclera: Conjunctivae normal.   Cardiovascular:     Rate and Rhythm: Normal rate and regular rhythm.     Heart sounds: Normal heart sounds. No murmur heard. Pulmonary:     Effort: Pulmonary effort is normal. No respiratory distress.     Breath sounds: Normal breath sounds. No wheezing or rales.  Abdominal:     General: There is no distension.     Palpations: Abdomen is soft.     Tenderness: There is no abdominal tenderness.  Musculoskeletal:     Right lower leg: Edema (Mildly pitting edema three-quarter way up the lower leg) present.     Left  lower leg: Edema (Mildly pitting edema three-quarter way up the lower leg) present.  Skin:    General: Skin is warm and dry.     Findings: No rash.  Neurological:     Mental Status: He is alert. Mental status is at baseline.  Psychiatric:        Mood and Affect: Mood normal.        Lab Results  Component Value Date   WBC 15.9 (H) 04/01/2022   HGB 12.2 (L) 04/01/2022   HCT 37.4 (L) 04/01/2022   PLT 343.0 04/01/2022   GLUCOSE 90 04/05/2022   CHOL 126 10/15/2021   TRIG 82.0 10/15/2021   HDL 40.00 10/15/2021   LDLCALC 70 10/15/2021   ALT 11 03/25/2022   AST 20 03/25/2022   NA 138 04/05/2022   K 4.9 04/05/2022   CL 104 04/05/2022   CREATININE 2.62 (H) 04/05/2022   BUN 42 (H) 04/05/2022   CO2 21 04/05/2022   TSH 4.779 (H) 03/23/2022   PSA 0.00 (L) 03/31/2016   HGBA1C 6.4 10/15/2021   MICROALBUR 5.2 (H) 03/28/2013   DG Abd 2 Views CLINICAL DATA:  Abdominal pain.  Sigmoid colonic narrowing by CT.  EXAM: ABDOMEN - 2 VIEW  COMPARISON:  CT of the abdomen and pelvis on 03/23/2022  FINDINGS: There remains some gaseous distension of primarily the ascending and transverse colon with maximum diameter of approximately 9 cm at the level of the cecum. No evidence of free intraperitoneal air or significant small bowel dilatation. No abnormal calcifications.  IMPRESSION: Persistent gaseous distension of primarily the ascending and transverse colon with maximum  diameter of approximately 9 cm at the level of the cecum.  Electronically Signed   By: Aletta Edouard M.D.   On: 03/26/2022 10:48    Assessment & Plan:    See Problem List for Assessment and Plan of chronic medical problems.

## 2022-04-05 NOTE — Patient Instructions (Signed)
     Blood work was ordered.     Medications changes include :      Your prescription(s) have been sent to your pharmacy.    A referral was ordered for XX.     Someone from that office will call you to schedule an appointment.    No follow-ups on file.

## 2022-04-06 ENCOUNTER — Encounter: Payer: Self-pay | Admitting: Internal Medicine

## 2022-04-06 ENCOUNTER — Other Ambulatory Visit: Payer: Self-pay | Admitting: Internal Medicine

## 2022-04-06 ENCOUNTER — Ambulatory Visit (INDEPENDENT_AMBULATORY_CARE_PROVIDER_SITE_OTHER): Payer: PPO | Admitting: Internal Medicine

## 2022-04-06 VITALS — BP 130/60 | HR 60 | Temp 98.1°F | Ht 71.0 in | Wt 185.0 lb

## 2022-04-06 DIAGNOSIS — I10 Essential (primary) hypertension: Secondary | ICD-10-CM

## 2022-04-06 DIAGNOSIS — E1122 Type 2 diabetes mellitus with diabetic chronic kidney disease: Secondary | ICD-10-CM

## 2022-04-06 DIAGNOSIS — N184 Chronic kidney disease, stage 4 (severe): Secondary | ICD-10-CM | POA: Diagnosis not present

## 2022-04-06 DIAGNOSIS — K5909 Other constipation: Secondary | ICD-10-CM | POA: Diagnosis not present

## 2022-04-06 DIAGNOSIS — E785 Hyperlipidemia, unspecified: Secondary | ICD-10-CM

## 2022-04-06 DIAGNOSIS — E039 Hypothyroidism, unspecified: Secondary | ICD-10-CM | POA: Diagnosis not present

## 2022-04-06 DIAGNOSIS — G4709 Other insomnia: Secondary | ICD-10-CM

## 2022-04-06 DIAGNOSIS — R600 Localized edema: Secondary | ICD-10-CM

## 2022-04-06 NOTE — Assessment & Plan Note (Signed)
Chronic  Lab Results  Component Value Date   HGBA1C 6.4 10/15/2021   Sugars well controlled Continue lifestyle control Stressed regular exercise, diabetic diet

## 2022-04-06 NOTE — Assessment & Plan Note (Addendum)
Chronic Has had chronic kidney disease for years His wife told me today his father had Bright disease and wonders if that is what he has-no other known family members with this Does have longstanding hypertension that is likely contributing.  Also has diabetes that is well controlled Compliant with no NSAIDs and low-sodium diet Encouraged increased activity, increased water intake Blood pressure good here today, but typically seems to be higher-advise monitoring BP and letting me know if its not controlled Referral to nephrology Edema in legs fairly controlled-he is not currently taking Lasix-we will wait to see what nephrology thinks about him needing it

## 2022-04-06 NOTE — Assessment & Plan Note (Signed)
Chronic Edema stable Not currently taking Lasix-continue to hold Continue compression socks daily and low-sodium diet Increase exercise, stressed elevating legs when he is sitting

## 2022-04-06 NOTE — Assessment & Plan Note (Addendum)
Chronic Euthyroid Continue levothyroxine 50 mcg daily

## 2022-04-06 NOTE — Assessment & Plan Note (Signed)
Chronic Continue Crestor 40 mg daily 

## 2022-04-06 NOTE — Assessment & Plan Note (Signed)
Chronic Management per Dr. Rush Landmark Having regular bowel movements-watery twice a day Encouraged good water intake and increased exercise

## 2022-04-06 NOTE — Assessment & Plan Note (Signed)
Chronic Controlled, Stable Continue trazodone 100 mg nightly 

## 2022-04-06 NOTE — Assessment & Plan Note (Signed)
Chronic Blood pressure well controlled here today, which does not seem to be the usual-advise monitoring BP closely and if it is not controlled we will need to adjust his medication CMP from yesterday reviewed-GFR slightly lower Continue amlodipine 5 mg daily

## 2022-04-07 ENCOUNTER — Encounter: Payer: Self-pay | Admitting: Internal Medicine

## 2022-04-16 ENCOUNTER — Inpatient Hospital Stay: Payer: PPO | Admitting: Internal Medicine

## 2022-04-27 ENCOUNTER — Other Ambulatory Visit: Payer: Self-pay | Admitting: Internal Medicine

## 2022-04-29 ENCOUNTER — Ambulatory Visit (HOSPITAL_BASED_OUTPATIENT_CLINIC_OR_DEPARTMENT_OTHER)
Admission: RE | Admit: 2022-04-29 | Discharge: 2022-04-29 | Disposition: A | Discharge status: Home / Self Care | Payer: PPO | Source: Ambulatory Visit | Attending: Gastroenterology | Admitting: Gastroenterology

## 2022-04-29 DIAGNOSIS — D72829 Elevated white blood cell count, unspecified: Secondary | ICD-10-CM | POA: Insufficient documentation

## 2022-04-29 DIAGNOSIS — K5732 Diverticulitis of large intestine without perforation or abscess without bleeding: Secondary | ICD-10-CM | POA: Insufficient documentation

## 2022-04-29 DIAGNOSIS — K5909 Other constipation: Secondary | ICD-10-CM | POA: Insufficient documentation

## 2022-04-29 NOTE — Progress Notes (Signed)
Attempt was made to contact provider prior to IV start and iSTAT testing of patient because of 04/05/22 eGFR of 23. Provider was not reached in order to discuss change of study to a non contrast exam.  IV was started and iSTAT testing done as 04/05/22 eGFR results did not fall within the protocols for IV contrast.   The CT Abdomen Pelvis with Contrast was unable to be preformed today due to iSTAT creatinine reading of 2.9; eGFR of 20. Per [facility] protocol limit is eGFR of 30.   IV removed; patient and wife were told of the iSTAT reading and told to contact their provider to have a non-contrasted study ordered.    

## 2022-04-30 ENCOUNTER — Other Ambulatory Visit: Payer: Self-pay

## 2022-04-30 ENCOUNTER — Encounter: Payer: Self-pay | Admitting: Internal Medicine

## 2022-04-30 ENCOUNTER — Encounter: Payer: Self-pay | Admitting: Gastroenterology

## 2022-04-30 DIAGNOSIS — K5732 Diverticulitis of large intestine without perforation or abscess without bleeding: Secondary | ICD-10-CM

## 2022-04-30 DIAGNOSIS — K5909 Other constipation: Secondary | ICD-10-CM

## 2022-05-04 ENCOUNTER — Encounter: Payer: Self-pay | Admitting: Gastroenterology

## 2022-05-04 ENCOUNTER — Ambulatory Visit: Payer: PPO | Admitting: Gastroenterology

## 2022-05-04 VITALS — BP 120/64 | HR 50 | Ht 71.0 in | Wt 189.0 lb

## 2022-05-04 DIAGNOSIS — K5909 Other constipation: Secondary | ICD-10-CM

## 2022-05-04 DIAGNOSIS — Z8719 Personal history of other diseases of the digestive system: Secondary | ICD-10-CM

## 2022-05-04 DIAGNOSIS — R140 Abdominal distension (gaseous): Secondary | ICD-10-CM | POA: Diagnosis not present

## 2022-05-04 DIAGNOSIS — R194 Change in bowel habit: Secondary | ICD-10-CM | POA: Diagnosis not present

## 2022-05-04 DIAGNOSIS — N189 Chronic kidney disease, unspecified: Secondary | ICD-10-CM

## 2022-05-04 DIAGNOSIS — K5900 Constipation, unspecified: Secondary | ICD-10-CM

## 2022-05-04 NOTE — Patient Instructions (Addendum)
You have been scheduled for a CT scan of the abdomen and pelvis at [facility]. You are scheduled on 05/22/22 at 11:00 am . You should arrive 15 minutes prior to your appointment time for registration.  We are giving you 2 bottles of contrast today that you will need to drink before arriving for the exam. The solution may taste better if refrigerated so put them in the refrigerator when you get home, but do NOT add ice or any other liquid to this solution as that would dilute it. Shake well before drinking.   Please follow the written instructions below on the day of your exam:   1) Do not eat anything after 7:00 am  (4 hours prior to your test)   2) Drink 1 bottle of contrast @ 9:00 am  (2 hours prior to your exam)  Remember to shake well before drinking and do NOT pour over ice.     Drink 1 bottle of contrast @ 10:00 am  (1 hour prior to your exam)   You may take any medications as prescribed with a small amount of water, if necessary. If you take any of the following medications: METFORMIN, GLUCOPHAGE, GLUCOVANCE, AVANDAMET, RIOMET, FORTAMET, [place] MET, JANUMET, GLUMETZA or METAGLIP, you MAY be asked to HOLD this medication 48 hours AFTER the exam.   The purpose of you drinking the oral contrast is to aid in the visualization of your intestinal tract. The contrast solution may cause some diarrhea. Depending on your individual set of symptoms, you may also receive an intravenous injection of x-ray contrast/dye. Plan on being at [facility] for 45 minutes or longer, depending on the type of exam you are having performed.   If you have any questions regarding your exam or if you need to reschedule, you may call [facility] at [phone] between the hours of 8:00 am and 5:00 pm, Monday-Friday.     Continue Miralax 1-2 times daily.   Continue Benefiber.   Continue Align.   Follow-up in 4 months.   _______________________________________________________  If you are  age 4 or older, your body mass index should be between 23-30. Your Body mass index is 26.36 kg/m. If this is out of the aforementioned range listed, please consider follow up with your Primary Care Provider.  If you are age 73 or younger, your body mass index should be between 19-25. Your Body mass index is 26.36 kg/m. If this is out of the aformentioned range listed, please consider follow up with your Primary Care Provider.   ________________________________________________________  The [facility] providers would like to encourage you to use [facility] to communicate with providers for non-urgent requests or questions.  Due to long hold times on the telephone, sending your provider a message by [facility] may be a faster and more efficient way to get a response.  Please allow 48 business hours for a response.  Please remember that this is for non-urgent requests.  _______________________________________________________  Thank you for choosing me and [facility].  Dr. Rush Landmark

## 2022-05-06 LAB — POCT I-STAT CREATININE: Creatinine, Ser: 2.9 mg/dL — ABNORMAL HIGH (ref 0.61–1.24)

## 2022-05-07 ENCOUNTER — Encounter: Payer: Self-pay | Admitting: Gastroenterology

## 2022-05-07 DIAGNOSIS — N189 Chronic kidney disease, unspecified: Secondary | ICD-10-CM | POA: Insufficient documentation

## 2022-05-07 DIAGNOSIS — Z8719 Personal history of other diseases of the digestive system: Secondary | ICD-10-CM | POA: Insufficient documentation

## 2022-05-07 DIAGNOSIS — R140 Abdominal distension (gaseous): Secondary | ICD-10-CM | POA: Insufficient documentation

## 2022-05-07 NOTE — Progress Notes (Signed)
[facility] VISIT   Primary Care Provider Binnie Rail, MD [place] [id number] [phone]  Patient Profile: [name] is a 88 y.o. male with a pmh significant for Diverticulosis, Prior Colon Polyps, incomplete colonoscopy, HTN, HLD, Cholelithiasis, MDD/Anxiety, DM, GERD, Prostate Cancer, OSA, Glaucoma, CRI, Lactose Intolerance, diverticulosis (recent diverticulitis), chronic constipation.  The patient presents to the [facility] for an evaluation and management of problem(s) noted below:  Problem List 1. Change in bowel habits   2. Other constipation   3. History of colitis   4. Gassiness   5. Chronic renal impairment, unspecified CKD stage     History of Present Illness: Please see prior notes for full details of HPI..  Interval History: The patient is seen in follow-up with his wife and with his daughter on the phone.  Since her last clinic visit, we had scheduled the patient to undergo a CT scan.  Unfortunately, due to his creatinine at the time of his imaging study being taken being to elevated with a lower GFR, we could not pursue his imaging study with contrast.  A new order was placed into the system to work on performing an oral contrast/non-IV contrast CT abdomen/pelvis to follow-up the previous colitis and ensure that there are no other findings.  He has been taking his MiraLAX at least once if not twice daily to have at least 2-4 bowel movements per day.  They are softer or looser but he is not having any abdominal pain or abdominal distention or issues like he was having/experiencing previously.  He continues to take FiberCon once daily.  GI Review of Systems Positive as above Negative for odynophagia, dysphagia, nausea, vomiting, pain, melena, hematochezia   Review of Systems General: Denies fevers/chills/unintentional weight loss Cardiovascular: Denies chest pain Pulmonary: Denies shortness of  breath Gastroenterological: See HPI Dermatological: Denies jaundice Psychological: Mood is stable   Medications Current Outpatient Medications  Medication Sig Dispense Refill   amLODipine (NORVASC) 5 MG tablet Take 1 tablet (5 mg total) by mouth daily. 90 tablet 3   Cholecalciferol (VITAMIN D) 2000 UNITS CAPS Take 2 capsules by mouth daily.     erythromycin ophthalmic ointment Place into the left eye 3 (three) times daily.     escitalopram (LEXAPRO) 5 MG tablet Take 1 tablet (5 mg total) by mouth daily. 30 tablet 5   ferrous sulfate 325 (65 FE) MG EC tablet Take 1 tablet (325 mg total) by mouth every other day. 15 tablet 3   furosemide (LASIX) 40 MG tablet Take 1 tablet by mouth as needed for fluid retention (once weekly on Sundays). 10 tablet 3   levothyroxine (SYNTHROID) 50 MCG tablet Take 1 tablet (50 mcg total) by mouth daily. 90 tablet 3   polycarbophil (FIBERCON) 625 MG tablet Take 625 mg by mouth daily.     polyethylene glycol (MIRALAX / GLYCOLAX) 17 g packet Take 17 g by mouth 2 (two) times daily. Take 1 to 2 capfuls daily at noon 14 each 0   Probiotic Product (ALIGN) 4 MG CAPS Take 1 capsule (4 mg total) by mouth daily.     rosuvastatin (CRESTOR) 40 MG tablet TAKE ONE TABLET BY MOUTH EVERY DAY 90 tablet 0   traZODone (DESYREL) 100 MG tablet TAKE ONE TABLET AT BEDTIME. 30 tablet 11   No current facility-administered medications for this visit.    Allergies Allergies  Allergen Reactions   Sulfa Antibiotics     Other reaction(s):  RASH,PAPULAR   Sulfonamide Derivatives     REACTION: Itch/Red Spots    Histories Past Medical History:  Diagnosis Date   Anxiety    BRADYCARDIA 11/13/2008   Cataract    Cholelithiasis    COLONIC POLYPS, HX OF 02/04/2007   DEPRESSION 02/04/2007   DIABETES MELLITUS, TYPE II 02/04/2007   DIVERTICULOSIS, COLON 02/04/2007   FOREIGN BODY, ASPIRATION 12/04/2007   GERD (gastroesophageal reflux disease)    GLAUCOMA 03/08/2009   Hepatitis A 1963    HYPERLIPIDEMIA 12/04/2007   HYPERTENSION 02/04/2007   Memory loss    OBSTRUCTIVE SLEEP APNEA 11/05/2009   -PSG 01/05/10 RDI 11, PLMI 56   OSTEOARTHRITIS 02/04/2007   Peptic stricture of esophagus    PROSTATE CANCER 1997   RENAL INSUFFICIENCY 11/13/2008   SYNCOPE 12/31/2009   THROMBOCYTOPENIA 04/17/2008   THYROID NODULE, RIGHT 11/13/2008   UNSPECIFIED ANEMIA 11/07/2007   Past Surgical History:  Procedure Laterality Date   Heart Cartherization  2000   PROSTATECTOMY  1997   TONSILLECTOMY  1940's   Social History   Socioeconomic History   Marital status: Married    Spouse name: [name]   Number of children: 3   Years of education: college   Highest education level: Bachelor's degree (e.g., BA, AB, BS)  Occupational History   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   Right-handed.   2 cups caffeine per day.      Social Determinants of Health   Financial Resource Strain: Low Risk  (11/09/2021)   Overall Financial Resource Strain (CARDIA)    Difficulty of Paying Living Expenses: Not hard at all  Food Insecurity: No Food Insecurity (03/31/2022)   Hunger Vital Sign    Worried About Running Out of Food in the Last Year: Never true    Ran Out of Food in the Last Year: Never true  Transportation Needs: No Transportation Needs (03/31/2022)   PRAPARE - Hydrologist (Medical): No    Lack of Transportation (Non-Medical): No  Physical Activity: Sufficiently Active (11/09/2021)   Exercise Vital Sign    Days of  Exercise per Week: 6 days    Minutes of Exercise per Session: 30 min  Stress: No Stress Concern Present (11/09/2021)   Reserve    Feeling of Stress : Not at all  Social Connections: [place] (11/09/2021)   Social Connection and Isolation Panel [NHANES]    Frequency of Communication with Friends and Family: More than three times a week    Frequency of Social Gatherings with Friends and Family: More than three times a week    Attends Religious Services: More than 4 times per year    Active Member of [employer] or Organizations: Yes    Attends Archivist Meetings: More than 4 times per year    Marital Status: Married  Human resources officer Violence: Not At Risk (11/09/2021)   Humiliation, Afraid,  Rape, and Kick questionnaire    Fear of Current or Ex-Partner: No    Emotionally Abused: No    Physically Abused: No    Sexually Abused: No   Family History  Problem Relation Age of Onset   Kidney disease Father        Bright's Disease - died at age 79   Other Mother        no signifincant health problems - died at age 13   Heart disease Neg Hx        No FH of Coronary Artery Disease   Cancer Neg Hx    Colon cancer Neg Hx    Esophageal cancer Neg Hx    Inflammatory bowel disease Neg Hx    Liver disease Neg Hx    Pancreatic cancer Neg Hx    Rectal cancer Neg Hx    Stomach cancer Neg Hx    I have reviewed his medical, social, and family history in detail and updated the electronic medical record as necessary.    PHYSICAL EXAMINATION  BP 120/64   Pulse (!) 50   Ht '5\' 11"'$  (1.803 m)   Wt 189 lb (85.7 kg)   SpO2 98%   BMI 26.36 kg/m  Wt Readings from Last 3 Encounters:  05/04/22 189 lb (85.7 kg)  04/06/22 185 lb (83.9 kg)  03/31/22 192 lb (87.1 kg)  GEN: NAD, appears stated age, doesn't appear chronically ill, accompanied by wife PSYCH: Cooperative, without pressured speech EYE: Conjunctivae pink,  sclerae anicteric ENT: MMM CV: Nontachycardic RESP: No audible wheezing GI: NABS, soft, protuberant abdomen (similar to prior), distended, nontender, without rebound or guarding MSK/EXT: No lower extremity edema SKIN: No jaundice NEURO:  Alert & Oriented x 3, no focal deficits   REVIEW OF DATA  I reviewed the following data at the time of this encounter:  GI Procedures and Studies  Previously reviewed  Laboratory Studies  Reviewed in epic  Imaging Studies  No new imaging studies to review   ASSESSMENT  Mr. Steely is a 88 y.o. male with a pmh significant for Diverticulosis, Prior Colon Polyps, incomplete colonoscopy, HTN, HLD, Cholelithiasis, MDD/Anxiety, DM, GERD, Prostate Cancer, OSA, Glaucoma, CRI, Lactose Intolerance, diverticulosis (recent diverticulitis), chronic constipation.  The patient is seen today for evaluation and management of:  1. Change in bowel habits   2. Other constipation   3. History of colitis   4. Gassiness   5. Chronic renal impairment, unspecified CKD stage    The patient seems to be clinically and hemodynamically stable at this time.  He will maintain his current laxative therapy.  As I have been concerned, I suspect his chronic constipation as well as his anatomy in regards to significant stricturing in his sigmoid colon are likely the etiology of his recurrent symptoms.  His gassiness that occurs is likely a result of the final passage of stool and gas through the narrowed area of the sigmoid colon and a quick release at times.  With this being said, it is important to try to maintain his stools as soft or loose as possible to decrease the risk of having issues.  We are certainly waiting his repeat imaging study.  As long as there is no evidence of a masslike area that is concerning and he is doing well and not losing weight or developing new iron deficiency or anemia, we would hold on repeat colonoscopy attempt.  If there is something concerning on the  imaging, we will certainly have a  conversation with the patient's and patient's family to decide next steps.  Any repeat endoscopic evaluation would be attempted in the hospital-based outpatient setting with CO2 and ultraslim colonoscope availability, if that were to be needed.  We will see what the CT scan shows in the next few weeks.  He will follow-up with his primary care provider in regards to his CRI.  All patient questions were answered to the best of my ability, and the patient agrees to the aforementioned plan of action with follow-up as indicated.   PLAN  Continue fiber once daily MiraLAX scheduled daily in the morning with a second dose at lunchtime if he has not had at least 2 bowel movements Continue to promote fluid intake of 64-80 ounces of fluid per day at minimum If above therapies continue to cause patient to have persistent chronic constipation without using the restroom for 2 days then he will need to use 10 mg of oral Dulcolax and then consider repeat utilization of Linzess 72 mcg  Repeat CT abdomen pelvis with oral contrast and no IV contrast in the coming weeks (to be scheduled) Any repeat attempt at colonoscopy will need to be done in the hospital-based setting with CO2 and with ultraslim colonoscope and pediatric endoscope availability    No orders of the defined types were placed in this encounter.    New Prescriptions   No medications on file   Modified Medications   No medications on file    Planned Follow Up: No follow-ups on file.   Total Time in Face-to-Face and in Coordination of Care for patient including independent/personal interpretation/review of prior testing, medical history, examination, medication adjustment, communicating results with the patient directly, and documentation with the EHR is 20 minutes.   Justice Britain, MD [facility]oscopy Office # [phone]

## 2022-05-08 ENCOUNTER — Other Ambulatory Visit (HOSPITAL_BASED_OUTPATIENT_CLINIC_OR_DEPARTMENT_OTHER): Payer: PPO

## 2022-05-10 DIAGNOSIS — L570 Actinic keratosis: Secondary | ICD-10-CM | POA: Diagnosis not present

## 2022-05-10 DIAGNOSIS — I872 Venous insufficiency (chronic) (peripheral): Secondary | ICD-10-CM | POA: Diagnosis not present

## 2022-05-10 DIAGNOSIS — Z85828 Personal history of other malignant neoplasm of skin: Secondary | ICD-10-CM | POA: Diagnosis not present

## 2022-05-10 DIAGNOSIS — I8311 Varicose veins of right lower extremity with inflammation: Secondary | ICD-10-CM | POA: Diagnosis not present

## 2022-05-10 DIAGNOSIS — I8312 Varicose veins of left lower extremity with inflammation: Secondary | ICD-10-CM | POA: Diagnosis not present

## 2022-05-12 ENCOUNTER — Ambulatory Visit: Payer: PPO | Admitting: Gastroenterology

## 2022-05-21 ENCOUNTER — Other Ambulatory Visit: Payer: Self-pay | Admitting: Internal Medicine

## 2022-05-21 DIAGNOSIS — F320 Major depressive disorder, single episode, mild: Secondary | ICD-10-CM

## 2022-05-22 ENCOUNTER — Ambulatory Visit (HOSPITAL_BASED_OUTPATIENT_CLINIC_OR_DEPARTMENT_OTHER)
Admission: RE | Admit: 2022-05-22 | Discharge: 2022-05-22 | Disposition: A | Discharge status: Home / Self Care | Payer: PPO | Source: Ambulatory Visit | Attending: Gastroenterology | Admitting: Gastroenterology

## 2022-05-22 DIAGNOSIS — K5732 Diverticulitis of large intestine without perforation or abscess without bleeding: Secondary | ICD-10-CM | POA: Diagnosis not present

## 2022-05-22 DIAGNOSIS — K5909 Other constipation: Secondary | ICD-10-CM | POA: Insufficient documentation

## 2022-05-22 DIAGNOSIS — K5900 Constipation, unspecified: Secondary | ICD-10-CM | POA: Diagnosis not present

## 2022-05-22 DIAGNOSIS — K5730 Diverticulosis of large intestine without perforation or abscess without bleeding: Secondary | ICD-10-CM | POA: Diagnosis not present

## 2022-05-24 ENCOUNTER — Encounter: Payer: Self-pay | Admitting: Gastroenterology

## 2022-05-24 ENCOUNTER — Telehealth: Payer: Self-pay | Admitting: Gastroenterology

## 2022-05-24 NOTE — Telephone Encounter (Signed)
Mansouraty, Telford Nab., MD  Timothy Lasso, RN Cc: Binnie Rail, MD Thaddeaus Monica, Please let the patient and patient's wife know that I have reviewed the CT scan.  Overall things look much improved compared to prior.  There still remains a 4-1/2 cm segment of wall thickening and narrowing which I suspect is still fibrostenotic more than truly a mass or lesion.  He does have enlarged lymph nodes which may go along with recent history of diverticulitis.  They make mention about history of prostate cancer, so may be needs consideration of updated PSA if it has not been done in a few years but that would be something I would allow his primary to consider as well as his urology provider (if he still has 1).  I think we need to see him in clinic, myself, so that we can finalize a plan of continued bowel regimen as he has been doing versus consideration of increased risk colonoscopy repeat attempt in the hospital.  Please schedule as able, okay to overbook or use a held slot.  This can occur in December or January.  For now continue his MiraLAX regimen as he has been doing and as per prior clinic notes.  Thanks. GM  FYI SJB

## 2022-05-24 NOTE — Telephone Encounter (Signed)
Pt's wife is calling wanting someone to explain the CT scan results to her in a way she can understand. Please advise

## 2022-05-25 NOTE — Telephone Encounter (Signed)
The pt has been scheduled for a follow up.  The pt sent a My Chart message so the information was sent to him via My Chart.

## 2022-06-01 DIAGNOSIS — E113293 Type 2 diabetes mellitus with mild nonproliferative diabetic retinopathy without macular edema, bilateral: Secondary | ICD-10-CM | POA: Diagnosis not present

## 2022-06-01 DIAGNOSIS — H35373 Puckering of macula, bilateral: Secondary | ICD-10-CM | POA: Diagnosis not present

## 2022-06-01 DIAGNOSIS — D3132 Benign neoplasm of left choroid: Secondary | ICD-10-CM | POA: Diagnosis not present

## 2022-06-01 DIAGNOSIS — Z961 Presence of intraocular lens: Secondary | ICD-10-CM | POA: Diagnosis not present

## 2022-06-01 DIAGNOSIS — H43813 Vitreous degeneration, bilateral: Secondary | ICD-10-CM | POA: Diagnosis not present

## 2022-06-07 DIAGNOSIS — I129 Hypertensive chronic kidney disease with stage 1 through stage 4 chronic kidney disease, or unspecified chronic kidney disease: Secondary | ICD-10-CM | POA: Diagnosis not present

## 2022-06-07 DIAGNOSIS — E1122 Type 2 diabetes mellitus with diabetic chronic kidney disease: Secondary | ICD-10-CM | POA: Diagnosis not present

## 2022-06-07 DIAGNOSIS — N184 Chronic kidney disease, stage 4 (severe): Secondary | ICD-10-CM | POA: Diagnosis not present

## 2022-06-08 DIAGNOSIS — B351 Tinea unguium: Secondary | ICD-10-CM | POA: Diagnosis not present

## 2022-06-08 DIAGNOSIS — E1159 Type 2 diabetes mellitus with other circulatory complications: Secondary | ICD-10-CM | POA: Diagnosis not present

## 2022-06-08 DIAGNOSIS — L84 Corns and callosities: Secondary | ICD-10-CM | POA: Diagnosis not present

## 2022-06-09 ENCOUNTER — Ambulatory Visit: Payer: PPO | Attending: Cardiovascular Disease | Admitting: Cardiovascular Disease

## 2022-06-09 ENCOUNTER — Encounter: Payer: Self-pay | Admitting: Cardiovascular Disease

## 2022-06-09 VITALS — BP 130/62 | HR 52 | Ht 70.0 in | Wt 192.2 lb

## 2022-06-09 DIAGNOSIS — N1832 Chronic kidney disease, stage 3b: Secondary | ICD-10-CM | POA: Diagnosis not present

## 2022-06-09 DIAGNOSIS — I10 Essential (primary) hypertension: Secondary | ICD-10-CM | POA: Diagnosis not present

## 2022-06-09 DIAGNOSIS — R600 Localized edema: Secondary | ICD-10-CM

## 2022-06-09 NOTE — Progress Notes (Signed)
Cardiology Office Note:    Date:  06/09/2022   ID:  [name], DOB 1928-02-11, MRN [id number]  PCP:  Binnie Rail, MD   [facility] Cardiologist:  Sherren Mocha, MD     Referring MD: Binnie Rail, MD   Chief Complaint  Patient presents with   Leg Swelling    History of Present Illness:    [name] is a 88 y.o. male with a hx of hypertension, remote syncope, and mixed hyperlipidemia, presenting for follow-up evaluation.  Patient is here with his wife today.  He has developed chronic kidney disease stage IIIb.  He was recently seen by Dr. Moshe Cipro at [facility] and was started on furosemide for treatment of leg swelling.  Amlodipine was reduced from 5 mg down to 2.5 mg daily.  The patient is wearing compression stockings.  He elevates his legs at times but not consistently.  Notes that his blood pressure has been running at about 150/75 mmHg.  He denies chest pain, shortness of breath, heart palpitations, orthopnea, or PND.  Overall he feels like he is doing pretty well considering his advanced age.  His main complaint is leg swelling.  Past Medical History:  Diagnosis Date   Anxiety    BRADYCARDIA 11/13/2008   Cataract    Cholelithiasis    COLONIC POLYPS, HX OF 02/04/2007   DEPRESSION 02/04/2007   DIABETES MELLITUS, TYPE II 02/04/2007   DIVERTICULOSIS, COLON 02/04/2007   FOREIGN BODY, ASPIRATION 12/04/2007   GERD (gastroesophageal reflux disease)    GLAUCOMA 03/08/2009   Hepatitis A 1963   HYPERLIPIDEMIA 12/04/2007   HYPERTENSION 02/04/2007   Memory loss    OBSTRUCTIVE SLEEP APNEA 11/05/2009   -PSG 01/05/10 RDI 11, PLMI 56   OSTEOARTHRITIS 02/04/2007   Peptic stricture of esophagus    PROSTATE CANCER 1997   RENAL INSUFFICIENCY 11/13/2008   SYNCOPE 12/31/2009   THROMBOCYTOPENIA 04/17/2008   THYROID NODULE, RIGHT 11/13/2008   UNSPECIFIED ANEMIA 11/07/2007    Past Surgical History:  Procedure Laterality Date   Heart Cartherization  2000    PROSTATECTOMY  1997   TONSILLECTOMY  1940's    Current Medications: Current Meds  Medication Sig   amLODipine (NORVASC) 5 MG tablet Take 1 tablet (5 mg total) by mouth daily. (Patient taking differently: Take 5 mg by mouth daily. Per patient taking 2.5 mg daily in the morning.)   Cholecalciferol (VITAMIN D) 2000 UNITS CAPS Take 2 capsules by mouth daily.   erythromycin ophthalmic ointment Place into the left eye 3 (three) times daily.   escitalopram (LEXAPRO) 5 MG tablet Take 1 tablet (5 mg total) by mouth daily.   ferrous sulfate 325 (65 FE) MG EC tablet Take 1 tablet (325 mg total) by mouth every other day.   furosemide (LASIX) 40 MG tablet Take 1 tablet by mouth as needed for fluid retention (once weekly on Sundays). (Patient taking differently: 40 mg daily. Take 1 tablet by mouth as needed for fluid retention (once weekly on Sundays).)   levothyroxine (SYNTHROID) 50 MCG tablet Take 1 tablet (50 mcg total) by mouth daily.   polycarbophil (FIBERCON) 625 MG tablet Take 625 mg by mouth daily.   polyethylene glycol (MIRALAX / GLYCOLAX) 17 g packet Take 17 g by mouth 2 (two) times daily. Take 1 to 2 capfuls daily at noon   Probiotic Product (ALIGN) 4 MG CAPS Take 1 capsule (4 mg total) by mouth daily.   rosuvastatin (CRESTOR) 40 MG tablet TAKE ONE  TABLET BY MOUTH EVERY DAY   traZODone (DESYREL) 100 MG tablet TAKE ONE TABLET AT BEDTIME.     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   Right-handed.   2 cups caffeine per day.      Social Determinants of Health   Financial Resource Strain: Low Risk  (11/09/2021)   Overall Financial Resource Strain (CARDIA)    Difficulty of Paying Living Expenses: Not hard at all  Food Insecurity: No Food Insecurity (03/31/2022)   Hunger Vital Sign    Worried About Running Out of Food in the Last Year: Never true    Ran Out of Food in the Last Year: Never true  Transportation Needs: No Transportation Needs (03/31/2022)   PRAPARE - Hydrologist (Medical): No    Lack of Transportation (Non-Medical): No  Physical Activity: Sufficiently Active (11/09/2021)   Exercise Vital Sign    Days of Exercise per Week: 6 days    Minutes of Exercise per Session: 30 min  Stress: No Stress Concern Present (11/09/2021)   [place]    Feeling of Stress : Not at all  Social Connections: [place] (11/09/2021)   Social Connection and Isolation Panel [NHANES]    Frequency of Communication with Friends and Family: More than three times a week    Frequency of Social Gatherings with Friends and Family: More than three times a week    Attends Religious Services: More than 4 times per year    Active Member of [employer] or Organizations: Yes    Attends Music therapist: More than 4 times per year    Marital Status: Married     Family History: The patient's family history includes Kidney disease in his father; Other in his mother. There is no history of Heart disease, Cancer, Colon cancer, Esophageal cancer, Inflammatory bowel disease, Liver disease, Pancreatic cancer, Rectal cancer, or Stomach cancer.  ROS:    Please see the history of present illness.    All other systems reviewed and are negative.  EKGs/Labs/Other Studies Reviewed:    The following studies were reviewed today: Echo 07/07/2021: 1. Left ventricular ejection fraction, by estimation, is 60 to 65%. The  left ventricle has normal function. The left ventricle has no regional  wall motion abnormalities. Left ventricular diastolic parameters are  consistent with Grade I diastolic  dysfunction (impaired relaxation).   2. Right ventricular systolic function is normal. The right ventricular  size is normal.   3. Left atrial size was mildly dilated.   4. The mitral valve is normal in structure.  Mild mitral valve  regurgitation. No evidence of mitral stenosis.   5. The aortic valve is tricuspid. There is mild calcification of the  aortic valve. There is mild thickening of the aortic valve. Aortic valve  regurgitation is mild. Aortic valve sclerosis/calcification is present,  without any evidence of aortic  stenosis.   6. Pulmonic valve regurgitation is moderate.   7. The inferior vena cava is normal in size with greater than 50%  respiratory variability, suggesting right atrial pressure of 3 mmHg.    EKG:  EKG is not ordered today.    Recent Labs: 03/23/2022: TSH 4.779 03/25/2022: ALT 11 03/27/2022: Magnesium 2.1 04/01/2022: Hemoglobin 12.2; Platelets 343.0 04/05/2022: BUN 42; Potassium 4.9; Sodium 138 04/29/2022: Creatinine, Ser 2.90  Recent Lipid Panel    Component Value Date/Time   CHOL 126 10/15/2021 1142   TRIG 82.0 10/15/2021 1142   TRIG 48 04/05/2006 0750   HDL 40.00 10/15/2021 1142   CHOLHDL 3 10/15/2021 1142   VLDL 16.4 10/15/2021 1142   LDLCALC 70 10/15/2021 1142     Risk Assessment/Calculations:                Physical Exam:    VS:  BP 130/62   Pulse (!) 52   Ht '5\' 10"'$  (1.778 m)   Wt 192 lb 3.2 oz (87.2 kg)   SpO2 98%   BMI 27.58 kg/m     Wt Readings from Last 3 Encounters:  06/09/22 192 lb 3.2  oz (87.2 kg)  05/04/22 189 lb (85.7 kg)  04/06/22 185 lb (83.9 kg)     GEN:  Well nourished, well developed in no acute distress HEENT: Normal NECK: No JVD; No carotid bruits LYMPHATICS: No lymphadenopathy CARDIAC: RRR, no murmurs, rubs, gallops RESPIRATORY:  Clear to auscultation without rales, wheezing or rhonchi  ABDOMEN: Soft, non-tender, non-distended MUSCULOSKELETAL: 2+ edema left leg, 1+ edema right leg; No deformity  SKIN: Warm and dry NEUROLOGIC:  Alert and oriented x 3 PSYCHIATRIC:  Normal affect   ASSESSMENT:    1. Essential hypertension   2. Stage 3b chronic kidney disease ([place])   3. Bilateral leg edema    PLAN:    In order of problems listed above:  Blood pressure on my recheck is 150/60 mmHg.  This is about where he is running when he has his blood pressure checked at [facility].  Treatment options are limited as he has had edema on amlodipine.  He has significant enough kidney disease that he is a poor candidate for ACE/ARB.  He is too bradycardic for a beta-blocker.  I think we should continue to monitor him at this time.  I did not recommend any medication changes.  His amlodipine was just reduced by nephrology to see if it helps his leg edema and I think this is appropriate. Followed by Dr. Moshe Cipro at [facility].  Most recent creatinine is 2.9. I suspect this is largely due to venous insufficiency.  I reviewed the importance of compression stockings and leg elevation.  I demonstrated the lounge Dr. Device to him today.  He has this at home and uses it at times but not as regularly as he should be.  Continue current management.  Amlodipine reduced as above.  No signs of heart failure with an echo last year demonstrating normal LV systolic function and only grade 1 diastolic dysfunction with no valvular disease.           Medication Adjustments/Labs and Tests Ordered: Current medicines are reviewed at  length with the patient today.  Concerns regarding  medicines are outlined above.  No orders of the defined types were placed in this encounter.  No orders of the defined types were placed in this encounter.   Patient Instructions  Medication Instructions:  Your physician recommends that you continue on your current medications as directed. Please refer to the Current Medication list given to you today.  *If you need a refill on your cardiac medications before your next appointment, please call your pharmacy*   Lab Work: NONE If you have labs (blood work) drawn today and your tests are completely normal, you will receive your results only by: [facility] (if you have MyChart) OR A paper copy in the mail If you have any lab test that is abnormal or we need to change your treatment, we will call you to review the results.   Testing/Procedures: NONE   Follow-Up: At [facility], you and your health needs are our priority.  As part of our continuing mission to provide you with exceptional heart care, we have created designated Provider Care Teams.  These Care Teams include your primary Cardiologist (physician) and Advanced Practice Providers (APPs -  Physician Assistants and Nurse Practitioners) who all work together to provide you with the care you need, when you need it.  We recommend signing up for the patient portal called "MyChart".  Sign up information is provided on this After Visit Summary.  MyChart is used to connect with patients for Virtual Visits (Telemedicine).  Patients are able to view lab/test results, encounter notes, upcoming appointments, etc.  Non-urgent messages can be sent to your provider as well.   To learn more about what you can do with MyChart, go to [email or web address].    Your next appointment:   6 month(s)  The format for your next appointment:   In Person  Provider:   Nicholes Rough, PA-C, Melina Copa, PA-C, Ambrose Pancoast, NP, Ermalinda Barrios, PA-C, Christen Bame, NP, or Richardson Dopp, PA-C      Then, Sherren Mocha, MD will plan to see you again in 1 year(s).      Important Information About Sugar         Signed, Sherren Mocha, MD  06/09/2022 1:44 PM    [place]

## 2022-06-09 NOTE — Patient Instructions (Signed)
Medication Instructions:  Your physician recommends that you continue on your current medications as directed. Please refer to the Current Medication list given to you today.  *If you need a refill on your cardiac medications before your next appointment, please call your pharmacy*   Lab Work: NONE If you have labs (blood work) drawn today and your tests are completely normal, you will receive your results only by: [place] (if you have MyChart) OR A paper copy in the mail If you have any lab test that is abnormal or we need to change your treatment, we will call you to review the results.   Testing/Procedures: NONE   Follow-Up: At [facility], you and your health needs are our priority.  As part of our continuing mission to provide you with exceptional heart care, we have created designated Provider Care Teams.  These Care Teams include your primary Cardiologist (physician) and Advanced Practice Providers (APPs -  Physician Assistants and Nurse Practitioners) who all work together to provide you with the care you need, when you need it.  We recommend signing up for the patient portal called "MyChart".  Sign up information is provided on this After Visit Summary.  MyChart is used to connect with patients for Virtual Visits (Telemedicine).  Patients are able to view lab/test results, encounter notes, upcoming appointments, etc.  Non-urgent messages can be sent to your provider as well.   To learn more about what you can do with MyChart, go to [email or web address].    Your next appointment:   6 month(s)  The format for your next appointment:   In Person  Provider:   Nicholes Rough, PA-C, Melina Copa, PA-C, Ambrose Pancoast, NP, Ermalinda Barrios, PA-C, Christen Bame, NP, or Richardson Dopp, PA-C     Then, Sherren Mocha, MD will plan to see you again in 1 year(s).      Important Information About Sugar

## 2022-06-16 ENCOUNTER — Encounter: Payer: Self-pay | Admitting: Gastroenterology

## 2022-06-16 ENCOUNTER — Ambulatory Visit: Payer: PPO | Admitting: Gastroenterology

## 2022-06-16 VITALS — BP 130/68 | HR 52 | Ht 70.0 in | Wt 190.0 lb

## 2022-06-16 DIAGNOSIS — R933 Abnormal findings on diagnostic imaging of other parts of digestive tract: Secondary | ICD-10-CM

## 2022-06-16 DIAGNOSIS — Z8719 Personal history of other diseases of the digestive system: Secondary | ICD-10-CM | POA: Insufficient documentation

## 2022-06-16 DIAGNOSIS — R140 Abdominal distension (gaseous): Secondary | ICD-10-CM | POA: Diagnosis not present

## 2022-06-16 DIAGNOSIS — K5909 Other constipation: Secondary | ICD-10-CM | POA: Diagnosis not present

## 2022-06-16 NOTE — Progress Notes (Signed)
[place] VISIT   Primary Care Provider Binnie Rail, MD [place] [address] [phone]  Patient Profile: [name] is a 88 y.o. male with a pmh significant for Diverticulosis, Prior Colon Polyps, incomplete colonoscopy, HTN, HLD, Cholelithiasis, MDD/Anxiety, DM, GERD, Prostate Cancer, OSA, Glaucoma, CRI, Lactose Intolerance, diverticulosis (recent diverticulitis), chronic constipation.  The patient presents to the [facility] for an evaluation and management of problem(s) noted below:  Problem List 1. Chronic constipation   2. Abnormal CT scan, sigmoid colon   3. History of colonic diverticulitis   4. Gassiness      History of Present Illness: Please see prior notes for full details of HPI..  Interval History: The patient is seen in follow up alone today.  His wife is dealing with the Flu.  He had recent CT with results as below.  He is moving his bowels every day at least 2-times daily.  He is taking his Miralax at least 1 if not 2 times.  No other issues have developed or changed.  He and his family have discussed that they would like to wait on repeating an attempt at colonoscopy unless something else changes.  There has been no blood in his stools.  GI Review of Systems Positive as above Negative for odynophagia, dysphagia, nausea, vomiting, pain, melena, hematochezia   Review of Systems General: Denies fevers/chills/unintentional weight loss Cardiovascular: Denies chest pain Pulmonary: Denies shortness of breath Gastroenterological: See HPI Dermatological: Denies jaundice Psychological: Mood is stable   Medications Current Outpatient Medications  Medication Sig Dispense Refill   amLODipine (NORVASC) 2.5 MG tablet Take 2.5 mg by mouth daily.     Cholecalciferol (VITAMIN D) 2000 UNITS CAPS Take 2 capsules by mouth daily.     escitalopram (LEXAPRO) 5 MG tablet Take 1 tablet (5 mg total) by  mouth daily. 30 tablet 5   ferrous sulfate 325 (65 FE) MG EC tablet Take 1 tablet (325 mg total) by mouth every other day. 15 tablet 3   furosemide (LASIX) 40 MG tablet Take 1 tablet by mouth as needed for fluid retention (once weekly on Sundays). (Patient taking differently: 40 mg daily. Take 1 tablet by mouth as needed for fluid retention (once weekly on Sundays).) 10 tablet 3   levothyroxine (SYNTHROID) 50 MCG tablet Take 1 tablet (50 mcg total) by mouth daily. 90 tablet 3   polycarbophil (FIBERCON) 625 MG tablet Take 625 mg by mouth daily.     polyethylene glycol (MIRALAX / GLYCOLAX) 17 g packet Take 17 g by mouth 2 (two) times daily. Take 1 to 2 capfuls daily at noon 14 each 0   Probiotic Product (ALIGN) 4 MG CAPS Take 1 capsule (4 mg total) by mouth daily.     rosuvastatin (CRESTOR) 40 MG tablet TAKE ONE TABLET BY MOUTH EVERY DAY 90 tablet 0   traZODone (DESYREL) 100 MG tablet TAKE ONE TABLET AT BEDTIME. 90 tablet 3   No current facility-administered medications for this visit.    Allergies Allergies  Allergen Reactions   Sulfa Antibiotics     Other reaction(s): RASH,PAPULAR   Sulfonamide Derivatives     REACTION: Itch/Red Spots    Histories Past Medical History:  Diagnosis Date   Anxiety    BRADYCARDIA 11/13/2008   Cataract    Cholelithiasis    COLONIC POLYPS, HX OF 02/04/2007   DEPRESSION 02/04/2007   DIABETES MELLITUS, TYPE II 02/04/2007   DIVERTICULOSIS, COLON 02/04/2007   FOREIGN BODY,  ASPIRATION 12/04/2007   GERD (gastroesophageal reflux disease)    GLAUCOMA 03/08/2009   Hepatitis A 1963   HYPERLIPIDEMIA 12/04/2007   HYPERTENSION 02/04/2007   Memory loss    OBSTRUCTIVE SLEEP APNEA 11/05/2009   -PSG 01/05/10 RDI 11, PLMI 56   OSTEOARTHRITIS 02/04/2007   Peptic stricture of esophagus    PROSTATE CANCER 1997   RENAL INSUFFICIENCY 11/13/2008   SYNCOPE 12/31/2009   THROMBOCYTOPENIA 04/17/2008   THYROID NODULE, RIGHT 11/13/2008   UNSPECIFIED ANEMIA 11/07/2007   Past Surgical History:   Procedure Laterality Date   Heart Cartherization  2000   PROSTATECTOMY  1997   TONSILLECTOMY  1940's   Social History   Socioeconomic History   Marital status: Married    Spouse name: [name]   Number of children: 3   Years of education: college   Highest education level: Bachelor's degree (e.g., BA, AB, BS)  Occupational History   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   Right-handed.   2 cups caffeine per day.      Social Determinants of Health   Financial Resource Strain: Low Risk  (11/09/2021)   Overall Financial Resource Strain (CARDIA)    Difficulty of Paying Living Expenses: Not hard at all  Food Insecurity: No Food Insecurity (03/31/2022)   Hunger Vital Sign    Worried About Running Out of Food in the Last Year: Never true    Ran Out of Food in the Last Year: Never true  Transportation Needs: No Transportation Needs (03/31/2022)   PRAPARE - Hydrologist (Medical): No    Lack of Transportation (Non-Medical): No  Physical Activity: Sufficiently Active (11/09/2021)   Exercise Vital Sign    Days of Exercise per Week: 6 days    Minutes of Exercise per Session: 30 min  Stress: No Stress Concern Present (11/09/2021)   [place]    Feeling of Stress : Not at all  Social Connections: [place] (11/09/2021)   Social Connection and Isolation Panel [NHANES]    Frequency of Communication with Friends and Family: More  than three times a week    Frequency of Social Gatherings with Friends and Family: More than three times a week    Attends Religious Services: More than 4 times per year    Active Member of [employer] or Organizations: Yes    Attends Music therapist: More than 4 times per year    Marital Status: Married  Human resources officer Violence: Not At Risk (11/09/2021)   Humiliation, Afraid, Rape, and Kick questionnaire    Fear of Current or Ex-Partner: No    Emotionally Abused: No    Physically Abused: No    Sexually Abused: No   Family History  Problem Relation Age of Onset   Kidney disease Father        Bright's Disease - died at age 74   Other Mother  no signifincant health problems - died at age 77   Heart disease Neg Hx        No FH of Coronary Artery Disease   Cancer Neg Hx    Colon cancer Neg Hx    Esophageal cancer Neg Hx    Inflammatory bowel disease Neg Hx    Liver disease Neg Hx    Pancreatic cancer Neg Hx    Rectal cancer Neg Hx    Stomach cancer Neg Hx    I have reviewed his medical, social, and family history in detail and updated the electronic medical record as necessary.    PHYSICAL EXAMINATION  BP 130/68 (BP Location: Left Arm, Patient Position: Sitting, Cuff Size: Normal)   Pulse (!) 52   Ht '5\' 10"'$  (1.778 m)   Wt 190 lb (86.2 kg)   SpO2 99%   BMI 27.26 kg/m  Wt Readings from Last 3 Encounters:  06/16/22 190 lb (86.2 kg)  06/09/22 192 lb 3.2 oz (87.2 kg)  05/04/22 189 lb (85.7 kg)  GEN: NAD, appears stated age, doesn't appear chronically ill PSYCH: Cooperative, without pressured speech EYE: Conjunctivae pink, sclerae anicteric ENT: MMM CV: Nontachycardic RESP: No audible wheezing GI: NABS, soft, protuberant abdomen (similar to prior), distended, nontender, without rebound or guarding MSK/EXT: No lower extremity edema SKIN: No jaundice NEURO:  Alert & Oriented x 3, no focal deficits   REVIEW OF DATA  I reviewed the following data at the time  of this encounter:  GI Procedures and Studies  Previously reviewed  Laboratory Studies  Reviewed in epic  Imaging Studies  November 2023 CT abdomen pelvis without contrast IMPRESSION: 1. There is a 4.5 cm segment of sigmoid colon with wall thickening and possible narrowing. The fecal loading proximal to this segment has improved in the interval. Malignancy in the sigmoid colon is not excluded on this study. Sigmoidoscopy was recommended on the March 23, 2022 study. If not already performed, recommend sigmoidoscopy or colonoscopy for better evaluation. The findings could also be due to multiple previous bouts of diverticulitis resulting in scarring and possible stenosis. 2. Multiple prominent mildly enlarged lymph nodes in the pelvis and inguinal regions bilaterally. Given recent suspected diverticulitis, these nodes could be reactive. However, if the patient is found to have colon cancer, metastatic nodes could not be excluded on this study. Also, the previous prostatectomy suggests previous prostate cancer which could also result in enlarged nodes in the setting of metastatic disease. The nodes are indeterminate on this study. 3. The previously identified inflammation around the distal descending colon and sigmoid colon has resolved or nearly resolved consistent with significant improvement/resolution of diverticulitis in the interval. 4. Calcified atherosclerotic change in the nonaneurysmal aorta.   ASSESSMENT  Mr. Mooneyhan is a 88 y.o. male with a pmh significant for Diverticulosis, Prior Colon Polyps, incomplete colonoscopy, HTN, HLD, Cholelithiasis, MDD/Anxiety, DM, GERD, Prostate Cancer, OSA, Glaucoma, CRI, Lactose Intolerance, diverticulosis (recent diverticulitis), chronic constipation.  The patient is seen today for evaluation and management of:  1. Chronic constipation   2. Abnormal CT scan, sigmoid colon   3. History of colonic diverticulitis   4. Gassiness     The patient is stable clinically and hemodynamically.  Will continue his current regimen to keep his bowels moving.  We discussed the results of the CT scan (offered to call his daughter or his wife but he declined today).  Although there is concern of other issues at play, due to his age and other medical issues  we are planning conservative management still.  He feels well as long as his bowels are moving.  I think ,this is segmental narrowing from prior diverticular disease.  In either case, We will plan a 98-monthfollow up CT scan as long as he is doing well.  If progressive symptoms, then will plan for attempt at increased anti-constipation medications and earlier repeat imaging.    If we get to point of considering repeat Colonoscopy will need to be done in hospital-based outpatient setting.  All patient questions were answered to the best of my ability, and the patient agrees to the aforementioned plan of action with follow-up as indicated.   PLAN  Continue fiber once daily MiraLAX scheduled daily in the morning with a second dose at lunchtime if he has not had at least 2 bowel movements Continue to promote fluid intake of 64-80 ounces of fluid per day at minimum If above therapies continue to cause patient to have persistent chronic constipation without using the restroom for 2 days then he will need to use 10 mg of oral Dulcolax and then consider repeat utilization of Linzess 72 mcg  Repeat CT abdomen pelvis with oral contrast and no IV contrast in 666-monthor sooner if other issues arise Any repeat attempt at colonoscopy will need to be done in the hospital-based setting with CO2 and with ultraslim colonoscope and pediatric endoscope availability    No orders of the defined types were placed in this encounter.    New Prescriptions   No medications on file   Modified Medications   No medications on file    Planned Follow Up: No follow-ups on file.   Total Time in Face-to-Face  and in Coordination of Care for patient including independent/personal interpretation/review of prior testing, medical history, examination, medication adjustment, communicating results with the patient directly, and documentation with the EHR is 25 minutes.   GaJustice BritainMD [facility] Office # [phone]

## 2022-06-16 NOTE — Patient Instructions (Signed)
Continue Miralax 1-2 daily , need to have at least 3 bowel movements daily.   Continue Align probiotics.   Continue Benefiber.   Follow up CT scan in Feb 2024. Will call to schedule.   Follow-up after CT scan in Feb 2024. Will call to schedule.     Thank you for choosing me and [facility].  Dr. Rush Landmark

## 2022-07-05 ENCOUNTER — Other Ambulatory Visit: Payer: Self-pay | Admitting: Internal Medicine

## 2022-07-06 NOTE — Progress Notes (Unsigned)
      Subjective:    Patient ID: [name], male    DOB: 03-09-1928, 89 y.o.   MRN: [id number]     HPI [name] is here for follow up of his chronic medical problems, including htn, hld, DM, OSA, CKD, hypothyroid, insomnia, MCI.  He is here with his wife.   Review ct scan - ? Psa Needs A1c, tsh,   Medications and allergies reviewed with patient and updated if appropriate.  Current Outpatient Medications on File Prior to Visit  Medication Sig Dispense Refill   amLODipine (NORVASC) 2.5 MG tablet Take 2.5 mg by mouth daily.     Cholecalciferol (VITAMIN D) 2000 UNITS CAPS Take 2 capsules by mouth daily.     escitalopram (LEXAPRO) 5 MG tablet Take 1 tablet (5 mg total) by mouth daily. 30 tablet 5   ferrous sulfate 325 (65 FE) MG EC tablet Take 1 tablet (325 mg total) by mouth every other day. 15 tablet 3   furosemide (LASIX) 40 MG tablet Take 1 tablet by mouth as needed for fluid retention (once weekly on Sundays). (Patient taking differently: 40 mg daily. Take 1 tablet by mouth as needed for fluid retention (once weekly on Sundays).) 10 tablet 3   levothyroxine (SYNTHROID) 50 MCG tablet Take 1 tablet (50 mcg total) by mouth daily. 90 tablet 3   polycarbophil (FIBERCON) 625 MG tablet Take 625 mg by mouth daily.     polyethylene glycol (MIRALAX / GLYCOLAX) 17 g packet Take 17 g by mouth 2 (two) times daily. Take 1 to 2 capfuls daily at noon 14 each 0   Probiotic Product (ALIGN) 4 MG CAPS Take 1 capsule (4 mg total) by mouth daily.     rosuvastatin (CRESTOR) 40 MG tablet TAKE ONE TABLET BY MOUTH EVERY DAY 90 tablet 0   traZODone (DESYREL) 100 MG tablet TAKE ONE TABLET AT BEDTIME. 90 tablet 3   No current facility-administered medications on file prior to visit.     Review of Systems     Objective:  There were no vitals filed for this visit. BP Readings from Last 3 Encounters:  06/16/22 130/68  06/09/22 130/62  05/04/22 120/64   Wt Readings from Last 3 Encounters:  06/16/22  190 lb (86.2 kg)  06/09/22 192 lb 3.2 oz (87.2 kg)  05/04/22 189 lb (85.7 kg)   There is no height or weight on file to calculate BMI.    Physical Exam     Lab Results  Component Value Date   WBC 15.9 (H) 04/01/2022   HGB 12.2 (L) 04/01/2022   HCT 37.4 (L) 04/01/2022   PLT 343.0 04/01/2022   GLUCOSE 90 04/05/2022   CHOL 126 10/15/2021   TRIG 82.0 10/15/2021   HDL 40.00 10/15/2021   LDLCALC 70 10/15/2021   ALT 11 03/25/2022   AST 20 03/25/2022   NA 138 04/05/2022   K 4.9 04/05/2022   CL 104 04/05/2022   CREATININE 2.90 (H) 04/29/2022   BUN 42 (H) 04/05/2022   CO2 21 04/05/2022   TSH 4.779 (H) 03/23/2022   PSA 0.00 (L) 03/31/2016   HGBA1C 6.4 10/15/2021   MICROALBUR 5.2 (H) 03/28/2013     Assessment & Plan:    See Problem List for Assessment and Plan of chronic medical problems.

## 2022-07-06 NOTE — Patient Instructions (Addendum)
      Blood work was ordered.   The lab is on the first floor.    Medications changes include :   None     Return in about 6 months (around 01/05/2023) for follow up.

## 2022-07-07 ENCOUNTER — Ambulatory Visit (INDEPENDENT_AMBULATORY_CARE_PROVIDER_SITE_OTHER): Payer: PPO | Admitting: Internal Medicine

## 2022-07-07 ENCOUNTER — Encounter: Payer: Self-pay | Admitting: Internal Medicine

## 2022-07-07 VITALS — BP 130/68 | HR 58 | Temp 98.0°F | Ht 70.0 in | Wt 187.0 lb

## 2022-07-07 DIAGNOSIS — E1122 Type 2 diabetes mellitus with diabetic chronic kidney disease: Secondary | ICD-10-CM | POA: Diagnosis not present

## 2022-07-07 DIAGNOSIS — I10 Essential (primary) hypertension: Secondary | ICD-10-CM

## 2022-07-07 DIAGNOSIS — E785 Hyperlipidemia, unspecified: Secondary | ICD-10-CM | POA: Diagnosis not present

## 2022-07-07 DIAGNOSIS — N184 Chronic kidney disease, stage 4 (severe): Secondary | ICD-10-CM | POA: Diagnosis not present

## 2022-07-07 DIAGNOSIS — G3184 Mild cognitive impairment, so stated: Secondary | ICD-10-CM

## 2022-07-07 DIAGNOSIS — K5909 Other constipation: Secondary | ICD-10-CM | POA: Diagnosis not present

## 2022-07-07 DIAGNOSIS — E039 Hypothyroidism, unspecified: Secondary | ICD-10-CM

## 2022-07-07 DIAGNOSIS — N1832 Chronic kidney disease, stage 3b: Secondary | ICD-10-CM | POA: Diagnosis not present

## 2022-07-07 DIAGNOSIS — R600 Localized edema: Secondary | ICD-10-CM

## 2022-07-07 DIAGNOSIS — G4709 Other insomnia: Secondary | ICD-10-CM | POA: Diagnosis not present

## 2022-07-07 DIAGNOSIS — Z125 Encounter for screening for malignant neoplasm of prostate: Secondary | ICD-10-CM

## 2022-07-07 LAB — CBC WITH DIFFERENTIAL/PLATELET
Basophils Absolute: 0 10*3/uL (ref 0.0–0.1)
Basophils Relative: 0.3 % (ref 0.0–3.0)
Eosinophils Absolute: 0 10*3/uL (ref 0.0–0.7)
Eosinophils Relative: 0 % (ref 0.0–5.0)
HCT: 42.9 % (ref 39.0–52.0)
Hemoglobin: 13.7 g/dL (ref 13.0–17.0)
Lymphocytes Relative: 40.6 % (ref 12.0–46.0)
Lymphs Abs: 5.2 10*3/uL — ABNORMAL HIGH (ref 0.7–4.0)
MCHC: 32 g/dL (ref 30.0–36.0)
MCV: 96.1 fl (ref 78.0–100.0)
Monocytes Absolute: 1.1 10*3/uL — ABNORMAL HIGH (ref 0.1–1.0)
Monocytes Relative: 8.5 % (ref 3.0–12.0)
Neutro Abs: 6.5 10*3/uL (ref 1.4–7.7)
Neutrophils Relative %: 50.6 % (ref 43.0–77.0)
Platelets: 349 10*3/uL (ref 150.0–400.0)
RBC: 4.47 Mil/uL (ref 4.22–5.81)
RDW: 15.5 % (ref 11.5–15.5)
WBC: 12.8 10*3/uL — ABNORMAL HIGH (ref 4.0–10.5)

## 2022-07-07 LAB — COMPREHENSIVE METABOLIC PANEL
ALT: 15 U/L (ref 0–53)
AST: 21 U/L (ref 0–37)
Albumin: 3.9 g/dL (ref 3.5–5.2)
Alkaline Phosphatase: 65 U/L (ref 39–117)
BUN: 53 mg/dL — ABNORMAL HIGH (ref 6–23)
CO2: 28 mEq/L (ref 19–32)
Calcium: 8.9 mg/dL (ref 8.4–10.5)
Chloride: 107 mEq/L (ref 96–112)
Creatinine, Ser: 2.88 mg/dL — ABNORMAL HIGH (ref 0.40–1.50)
GFR: 18.32 mL/min — ABNORMAL LOW (ref >60.00)
Glucose, Bld: 97 mg/dL (ref 70–99)
Potassium: 4.8 mEq/L (ref 3.5–5.1)
Sodium: 145 mEq/L (ref 135–145)
Total Bilirubin: 0.6 mg/dL (ref 0.2–1.2)
Total Protein: 6.3 g/dL (ref 6.0–8.3)

## 2022-07-07 LAB — LIPID PANEL
Cholesterol: 145 mg/dL (ref 0–200)
HDL: 42.8 mg/dL (ref >39.00)
LDL Cholesterol: 80 mg/dL (ref 0–99)
NonHDL: 102.13
Total CHOL/HDL Ratio: 3
Triglycerides: 109 mg/dL (ref 0.0–149.0)
VLDL: 21.8 mg/dL (ref 0.0–40.0)

## 2022-07-07 LAB — IBC PANEL
Iron: 97 ug/dL (ref 42–165)
Saturation Ratios: 36.5 % (ref 20.0–50.0)
TIBC: 266 ug/dL (ref 250.0–450.0)
Transferrin: 190 mg/dL — ABNORMAL LOW (ref 212.0–360.0)

## 2022-07-07 LAB — TSH: TSH: 4.56 u[IU]/mL (ref 0.35–5.50)

## 2022-07-07 LAB — FERRITIN: Ferritin: 336 ng/mL — ABNORMAL HIGH (ref 22.0–322.0)

## 2022-07-07 LAB — PSA, MEDICARE: PSA: 0 ng/ml — ABNORMAL LOW (ref 0.10–4.00)

## 2022-07-07 LAB — HEMOGLOBIN A1C: Hgb A1c MFr Bld: 6 % (ref 4.6–6.5)

## 2022-07-07 MED ORDER — FUROSEMIDE 40 MG PO TABS: 40.0000 mg | ORAL_TABLET | Freq: Every day | ORAL | 5 refills | Status: AC

## 2022-07-07 NOTE — Assessment & Plan Note (Signed)
Chronic  Clinically euthyroid Check tsh and will titrate med dose if needed Currently taking levothyroxine 50 mcg daily 

## 2022-07-07 NOTE — Assessment & Plan Note (Signed)
Chronic Regular exercise and healthy diet encouraged Check lipid panel  Continue Crestor 40 mg daily 

## 2022-07-07 NOTE — Assessment & Plan Note (Addendum)
Chronic Controlled, Stable Continue trazodone 100 mg at bedtime

## 2022-07-07 NOTE — Assessment & Plan Note (Signed)
Chronic Currently controlled with MiraLAX every morning, Benefiber in the afternoon If he has not had a bowel movement the day before he will take a second dose of MiraLAX in the evening Encouraged increased water intake

## 2022-07-07 NOTE — Assessment & Plan Note (Signed)
Chronic Blood pressure well controlled CMP Continue amlodipine 2.5 mg daily

## 2022-07-07 NOTE — Assessment & Plan Note (Addendum)
Chronic. Mild. Stable. ?

## 2022-07-07 NOTE — Assessment & Plan Note (Signed)
Chronic   Lab Results  Component Value Date   HGBA1C 6.4 10/15/2021   Sugars well controlled Check A1c Continue lifestyle control Stressed regular exercise, diabetic diet

## 2022-07-07 NOTE — Assessment & Plan Note (Signed)
Chronic Overall controlled-legs look good today He does get some swelling that accumulates during the day He is not consistent with elevating his legs during the day when sitting-encouraged him to do this more consistently Has Lasix 40 mg that he can take once a week as needed, but has not taken this

## 2022-07-07 NOTE — Assessment & Plan Note (Signed)
Chronic Has established with [facility]. [facility] CMP Encouraged increased water intake

## 2022-07-20 ENCOUNTER — Encounter: Payer: Self-pay | Admitting: Internal Medicine

## 2022-07-20 NOTE — Progress Notes (Unsigned)
    Subjective:    Patient ID: [name], male    DOB: [birth year], 89 y.o.   MRN: [id number]      HPI [name] is here for No chief complaint on file.    Lump on arm -     Medications and allergies reviewed with patient and updated if appropriate.  Current Outpatient Medications on File Prior to Visit  Medication Sig Dispense Refill   amLODipine (NORVASC) 2.5 MG tablet Take 2.5 mg by mouth daily.     Cholecalciferol (VITAMIN D) 2000 UNITS CAPS Take 2 capsules by mouth daily.     escitalopram (LEXAPRO) 5 MG tablet Take 1 tablet (5 mg total) by mouth daily. 30 tablet 5   ferrous sulfate 325 (65 FE) MG EC tablet Take 1 tablet (325 mg total) by mouth every other day. 15 tablet 3   furosemide (LASIX) 40 MG tablet Take 1 tablet (40 mg total) by mouth daily. Take 1 tablet by mouth as needed for fluid retention (once weekly on Sundays). 10 tablet 5   levothyroxine (SYNTHROID) 50 MCG tablet Take 1 tablet (50 mcg total) by mouth daily. 90 tablet 3   polycarbophil (FIBERCON) 625 MG tablet Take 625 mg by mouth daily.     polyethylene glycol (MIRALAX / GLYCOLAX) 17 g packet Take 17 g by mouth 2 (two) times daily. Take 1 to 2 capfuls daily at noon 14 each 0   Probiotic Product (ALIGN) 4 MG CAPS Take 1 capsule (4 mg total) by mouth daily.     rosuvastatin (CRESTOR) 40 MG tablet TAKE ONE TABLET BY MOUTH EVERY DAY 90 tablet 0   traZODone (DESYREL) 100 MG tablet TAKE ONE TABLET AT BEDTIME. 90 tablet 3   No current facility-administered medications on file prior to visit.    Review of Systems     Objective:  There were no vitals filed for this visit. BP Readings from Last 3 Encounters:  07/07/22 130/68  06/16/22 130/68  06/09/22 130/62   Wt Readings from Last 3 Encounters:  07/07/22 187 lb (84.8 kg)  06/16/22 190 lb (86.2 kg)  06/09/22 192 lb 3.2 oz (87.2 kg)   There is no height or weight on file to calculate BMI.    Physical Exam         Assessment & Plan:    See  Problem List for Assessment and Plan of chronic medical problems.

## 2022-07-21 ENCOUNTER — Ambulatory Visit (INDEPENDENT_AMBULATORY_CARE_PROVIDER_SITE_OTHER): Payer: PPO | Admitting: Internal Medicine

## 2022-07-21 ENCOUNTER — Encounter: Payer: Self-pay | Admitting: Internal Medicine

## 2022-07-21 VITALS — BP 136/80 | HR 55 | Temp 98.5°F | Ht 70.0 in | Wt 190.0 lb

## 2022-07-21 DIAGNOSIS — M7022 Olecranon bursitis, left elbow: Secondary | ICD-10-CM

## 2022-07-21 DIAGNOSIS — K635 Polyp of colon: Secondary | ICD-10-CM | POA: Insufficient documentation

## 2022-07-21 DIAGNOSIS — I10 Essential (primary) hypertension: Secondary | ICD-10-CM

## 2022-07-21 NOTE — Assessment & Plan Note (Signed)
Chronic Blood pressure well-controlled Continue amlodipine 2.5 mg daily

## 2022-07-21 NOTE — Assessment & Plan Note (Signed)
Acute Symptoms started 2 days ago He uses his elbow to get himself in and out of bed and therefore is applying a lot of pressure on it on a regular basis-no other trauma No evidence of infection Not able to take NSAIDs secondary to CKD Can apply ice Stressed that not applying any pressure or weight on the elbow Hopefully this will resolve on its own If it does not can consider aspiration, but would ideally like to avoid because of the risk of infection Discussed signs and symptoms of an infection and if he develops any advised that he must call immediately so I can start him on antibiotic

## 2022-07-21 NOTE — Patient Instructions (Addendum)
You have bursitis. You can lightly apply ice.  No medication is needed.   Do not lean or apply any pressure on the elbow.  This will hopefully go away on its own.  If it becomes red, painful, warm it may be infected and you should call immediately so we can start antibiotics.   If it does not get better we can have it drained, but we try to avoid that.       Elbow Bursitis  Elbow bursitis is the swelling of the fluid-filled sac (bursa) at the tip of the elbow. A bursa is like a cushion that protects the joint. If the bursa becomes irritated, it can fill with extra fluid and become swollen. What are the causes? Injury to the elbow. Leaning the elbow on a hard surface for a long time. Infection. Bone spurs. Certain conditions that cause swelling. Sometimes, the cause is not known. What are the signs or symptoms? The first sign of this condition is often swelling at the tip of the elbow. The swelling can grow to the size of a golf ball. Other symptoms include: Pain when bending or leaning on the elbow. Stiffness of the elbow. If the cause is infection, you may have: Redness, warmth, and tenderness. Pus coming from a cut near the elbow. How is this treated? Treatment depends on the cause. It may include: Medicines. Draining fluid from the bursa. Placing a bandage or pressure (compression) sleeve around the elbow. Wearing elbow pads. Surgery, if other treatments do not help. Follow these instructions at home: Medicines Take over-the-counter and prescription medicines only as told by your doctor. If you were prescribed an antibiotic medicine, take it as told by your doctor. Do not stop taking it even if you start to feel better. Managing pain, stiffness, and swelling     If told, put ice on the elbow. To do this: Put ice in a plastic bag. Place a towel between your skin and the bag. Leave the ice on for 20 minutes, 2-3 times a day. Take off the ice if your skin turns bright  red. This is very important. If you cannot feel pain, heat, or cold, you have a greater risk of damage to the area. If told, put heat on the affected area. Do this as often as told by your doctor. Use the heat source that your doctor recommends, such as a moist heat pack or a heating pad. Place a towel between your skin and the heat source. Leave the heat on for 20-30 minutes. Take off the heat if your skin turns bright red. This is very important. If you cannot feel pain, heat, or cold, you have a greater risk of getting burned. If your bursitis is caused by an injury, follow instructions from your doctor about: Resting your elbow. Wearing a bandage or sleeve. Wear elbow pads or elbow wraps as needed. These help cushion your elbow. General instructions Avoid any activities that cause elbow pain. Ask your doctor what activities are safe for you. Keep all follow-up visits. Contact a doctor if: You have a fever. You have problems that do not get better with treatment. You have pain or swelling that: Gets worse. Goes away and then comes back. You have pus draining from your elbow. You have redness around the elbow area. Your elbow feels warm to the touch. Get help right away if: You have trouble moving your arm, hand, or fingers. Summary Elbow bursitis is the swelling of the fluid-filled sac (bursa)  at the tip of the elbow. You may need to take medicine or put ice on your elbow. Contact your doctor if your problems do not get better with treatment. Also, contact your doctor if your problems go away and then come back. This information is not intended to replace advice given to you by your health care provider. Make sure you discuss any questions you have with your health care provider. Document Revised: 06/09/2021 Document Reviewed: 06/09/2021 Elsevier Patient Education  [place].

## 2022-07-30 ENCOUNTER — Telehealth: Payer: Self-pay | Admitting: Internal Medicine

## 2022-07-30 NOTE — Telephone Encounter (Signed)
Patient wife would like to know the name of the doctor at [facility] that can do the drainage for her husband. She would like a callback at 1 cause she will be out at 2, best callback is [phone].

## 2022-07-30 NOTE — Telephone Encounter (Signed)
Spoke with Ms. [name] today.  She would like for [name] to see someone in [facility] regarding the elbow for second opinion.  I told her I would have someone call to get him setup.

## 2022-08-02 ENCOUNTER — Encounter: Payer: Self-pay | Admitting: Internal Medicine

## 2022-08-15 ENCOUNTER — Emergency Department (HOSPITAL_COMMUNITY): Payer: PPO

## 2022-08-15 ENCOUNTER — Emergency Department (HOSPITAL_COMMUNITY)
Admission: EM | Admit: 2022-08-15 | Discharge: 2022-08-15 | Disposition: A | Discharge status: Home / Self Care | Payer: PPO | Attending: Emergency Medicine | Admitting: Emergency Medicine

## 2022-08-15 ENCOUNTER — Encounter (HOSPITAL_COMMUNITY): Payer: Self-pay | Admitting: Emergency Medicine

## 2022-08-15 DIAGNOSIS — T148XXA Other injury of unspecified body region, initial encounter: Secondary | ICD-10-CM

## 2022-08-15 DIAGNOSIS — W109XXA Fall (on) (from) unspecified stairs and steps, initial encounter: Secondary | ICD-10-CM | POA: Insufficient documentation

## 2022-08-15 DIAGNOSIS — I6523 Occlusion and stenosis of bilateral carotid arteries: Secondary | ICD-10-CM | POA: Diagnosis not present

## 2022-08-15 DIAGNOSIS — S199XXA Unspecified injury of neck, initial encounter: Secondary | ICD-10-CM | POA: Diagnosis not present

## 2022-08-15 DIAGNOSIS — S60811A Abrasion of right wrist, initial encounter: Secondary | ICD-10-CM | POA: Insufficient documentation

## 2022-08-15 DIAGNOSIS — S0001XA Abrasion of scalp, initial encounter: Secondary | ICD-10-CM | POA: Insufficient documentation

## 2022-08-15 DIAGNOSIS — S0990XA Unspecified injury of head, initial encounter: Secondary | ICD-10-CM

## 2022-08-15 DIAGNOSIS — M25521 Pain in right elbow: Secondary | ICD-10-CM | POA: Diagnosis not present

## 2022-08-15 DIAGNOSIS — I10 Essential (primary) hypertension: Secondary | ICD-10-CM | POA: Diagnosis not present

## 2022-08-15 DIAGNOSIS — M79641 Pain in right hand: Secondary | ICD-10-CM | POA: Diagnosis not present

## 2022-08-15 DIAGNOSIS — R42 Dizziness and giddiness: Secondary | ICD-10-CM | POA: Diagnosis not present

## 2022-08-15 DIAGNOSIS — M25531 Pain in right wrist: Secondary | ICD-10-CM | POA: Diagnosis not present

## 2022-08-15 DIAGNOSIS — W19XXXA Unspecified fall, initial encounter: Secondary | ICD-10-CM | POA: Diagnosis not present

## 2022-08-15 DIAGNOSIS — J3489 Other specified disorders of nose and nasal sinuses: Secondary | ICD-10-CM | POA: Diagnosis not present

## 2022-08-15 DIAGNOSIS — S0081XA Abrasion of other part of head, initial encounter: Secondary | ICD-10-CM | POA: Diagnosis not present

## 2022-08-15 DIAGNOSIS — R58 Hemorrhage, not elsewhere classified: Secondary | ICD-10-CM | POA: Diagnosis not present

## 2022-08-15 MED ORDER — TETANUS-DIPHTH-ACELL PERTUSSIS 5-2.5-18.5 LF-MCG/0.5 IM SUSY: 0.5000 mL | PREFILLED_SYRINGE | Freq: Once | INTRAMUSCULAR | Status: DC

## 2022-08-15 MED ORDER — BACITRACIN ZINC 500 UNIT/GM EX OINT: TOPICAL_OINTMENT | Freq: Two times a day (BID) | CUTANEOUS | Status: DC

## 2022-08-15 NOTE — ED Provider Triage Note (Signed)
Emergency Medicine Provider Triage Evaluation Note  [name] , a 89 y.o. male  was evaluated in triage.  Pt complains of no pain.  States he had a likely mechanical fall.  States that he was walking down the stairs when he just slipped and fell to the ground.  Struck his head on a piece of metal on the way to the ground.  Did not lose consciousness denies any nausea or vomiting confusion states his speech is normal.  He is very hard of hearing at baseline.  Review of Systems  Positive: Fall Negative: Fever  Physical Exam  BP (!) 189/78   Pulse 66   Temp 98 F (36.7 C)   Resp 18   SpO2 98%  Gen:   Awake, no distress   Resp:  Normal effort  MSK:   Moves extremities without difficulty  Other:  Moves all 4 extremities, speech is normal, smile symmetric, small laceration crescent shaped to the left temple.  Bandages are wrapped around right wrist.  Sensation intact in all fingers and full range of motion of fingers and wrists bilaterally.  Medical Decision Making  Medically screening exam initiated at 8:33 PM.  Appropriate orders placed.  [name] was informed that the remainder of the evaluation will be completed by another provider, this initial triage assessment does not replace that evaluation, and the importance of remaining in the ED until their evaluation is complete.  Basic labs, CT head and C-spine  Right wrist and hand x-ray   [name], [place] 08/15/22 2034

## 2022-08-15 NOTE — ED Notes (Signed)
Pt was able to walk to restroom with little assistance

## 2022-08-15 NOTE — ED Provider Notes (Signed)
[facility] Provider Note   CSN: [id number] Arrival date & time: 08/15/22  2007     History  Chief Complaint  Patient presents with   [name] is a 89 y.o. male presenting from [place] a mechanical fall that was witnessed today, injury to the back of the head and to his right wrist.  No loss of consciousness.  Not on blood thinners.  HPI     Home Medications Prior to Admission medications   Medication Sig Start Date End Date Taking? Authorizing Provider  amLODipine (NORVASC) 2.5 MG tablet Take 2.5 mg by mouth daily. 06/07/22   [provider]  Cholecalciferol (VITAMIN D) 2000 UNITS CAPS Take 2 capsules by mouth daily.    [provider]  escitalopram (LEXAPRO) 5 MG tablet Take 1 tablet (5 mg total) by mouth daily. 04/27/22   Binnie Rail, MD  ferrous sulfate 325 (65 FE) MG EC tablet Take 1 tablet (325 mg total) by mouth every other day. 11/14/19   Reed, Tiffany L, DO  furosemide (LASIX) 40 MG tablet Take 1 tablet (40 mg total) by mouth daily. Take 1 tablet by mouth as needed for fluid retention (once weekly on Sundays). 07/07/22   Binnie Rail, MD  levothyroxine (SYNTHROID) 50 MCG tablet Take 1 tablet (50 mcg total) by mouth daily. 10/18/21   Binnie Rail, MD  polycarbophil (FIBERCON) 625 MG tablet Take 625 mg by mouth daily.    [provider]  polyethylene glycol (MIRALAX / GLYCOLAX) 17 g packet Take 17 g by mouth 2 (two) times daily. Take 1 to 2 capfuls daily at noon 03/27/22   Eugenie Filler, MD  Probiotic Product (ALIGN) 4 MG CAPS Take 1 capsule (4 mg total) by mouth daily. 03/27/22   Eugenie Filler, MD  rosuvastatin (CRESTOR) 40 MG tablet TAKE ONE TABLET BY MOUTH EVERY DAY 07/05/22   Binnie Rail, MD  traZODone (DESYREL) 100 MG tablet TAKE ONE TABLET AT BEDTIME. 05/24/22   Binnie Rail, MD      Allergies    Sulfa antibiotics and Sulfonamide derivatives    Review of Systems    Review of Systems  Physical Exam Updated Vital Signs BP (!) 189/78   Pulse 66   Temp 98 F (36.7 C)   Resp 18   SpO2 98%  Physical Exam Constitutional:      General: He is not in acute distress. HENT:     Head: Normocephalic.  Eyes:     Conjunctiva/sclera: Conjunctivae normal.     Pupils: Pupils are equal, round, and reactive to light.  Cardiovascular:     Rate and Rhythm: Normal rate and regular rhythm.  Pulmonary:     Effort: Pulmonary effort is normal. No respiratory distress.  Abdominal:     General: There is no distension.     Tenderness: There is no abdominal tenderness.  Musculoskeletal:     Comments: Abrasion laceration to the occiput into the right wrist  Skin:    General: Skin is warm and dry.  Neurological:     General: No focal deficit present.     Mental Status: He is alert. Mental status is at baseline.  Psychiatric:        Mood and Affect: Mood normal.        Behavior: Behavior normal.     ED Results / Procedures / Treatments   Labs (all labs ordered are listed,  but only abnormal results are displayed) Labs Reviewed - No data to display  EKG None  Radiology CT HEAD WO CONTRAST (5MM)  Result Date: 08/15/2022 CLINICAL DATA:  Trauma. EXAM: CT HEAD WITHOUT CONTRAST CT CERVICAL SPINE WITHOUT CONTRAST TECHNIQUE: Multidetector CT imaging of the head and cervical spine was performed following the standard protocol without intravenous contrast. Multiplanar CT image reconstructions of the cervical spine were also generated. RADIATION DOSE REDUCTION: This exam was performed according to the departmental dose-optimization program which includes automated exposure control, adjustment of the mA and/or kV according to patient size and/or use of iterative reconstruction technique. COMPARISON:  Head CT dated 03/30/2021. FINDINGS: CT HEAD FINDINGS Brain: Mild age-related atrophy and chronic microvascular ischemic changes. There is no acute intracranial hemorrhage.  Faint hazy subdural area along the right frontoparietal convexity adjacent to the calvarium (coronal 36-47) may be artifactual and volume averaging. A small subdural hygroma is not excluded. MRI may provide better evaluation if clinically indicated. No mass effect or midline shift. Vascular: No hyperdense vessel or unexpected calcification. Skull: Normal. Negative for fracture or focal lesion. Sinuses/Orbits: Diffuse mucoperiosteal thickening of paranasal sinuses and partial opacification of maxillary sinuses. No air-fluid level. The mastoid air cells are clear. Other: None CT CERVICAL SPINE FINDINGS Alignment: No acute subluxation. Skull base and vertebrae: No acute fracture.  Osteopenia. Soft tissues and spinal canal: No prevertebral fluid or swelling. No visible canal hematoma. Disc levels:  Degenerative changes.  No acute findings. Upper chest: Negative. Other: Mild bilateral carotid bulb calcified plaques. IMPRESSION: 1. No acute intracranial hemorrhage. Volume averaging artifact versus possible small hygroma over the right convexity. MRI may provide better evaluation if clinically indicated. 2. Mild age-related atrophy and chronic microvascular ischemic changes. 3. No acute/traumatic cervical spine pathology. Electronically Signed   By: Anner Crete M.D.   On: 08/15/2022 21:33   CT Cervical Spine Wo Contrast  Result Date: 08/15/2022 CLINICAL DATA:  Trauma. EXAM: CT HEAD WITHOUT CONTRAST CT CERVICAL SPINE WITHOUT CONTRAST TECHNIQUE: Multidetector CT imaging of the head and cervical spine was performed following the standard protocol without intravenous contrast. Multiplanar CT image reconstructions of the cervical spine were also generated. RADIATION DOSE REDUCTION: This exam was performed according to the departmental dose-optimization program which includes automated exposure control, adjustment of the mA and/or kV according to patient size and/or use of iterative reconstruction technique. COMPARISON:   Head CT dated 03/30/2021. FINDINGS: CT HEAD FINDINGS Brain: Mild age-related atrophy and chronic microvascular ischemic changes. There is no acute intracranial hemorrhage. Faint hazy subdural area along the right frontoparietal convexity adjacent to the calvarium (coronal 36-47) may be artifactual and volume averaging. A small subdural hygroma is not excluded. MRI may provide better evaluation if clinically indicated. No mass effect or midline shift. Vascular: No hyperdense vessel or unexpected calcification. Skull: Normal. Negative for fracture or focal lesion. Sinuses/Orbits: Diffuse mucoperiosteal thickening of paranasal sinuses and partial opacification of maxillary sinuses. No air-fluid level. The mastoid air cells are clear. Other: None CT CERVICAL SPINE FINDINGS Alignment: No acute subluxation. Skull base and vertebrae: No acute fracture.  Osteopenia. Soft tissues and spinal canal: No prevertebral fluid or swelling. No visible canal hematoma. Disc levels:  Degenerative changes.  No acute findings. Upper chest: Negative. Other: Mild bilateral carotid bulb calcified plaques. IMPRESSION: 1. No acute intracranial hemorrhage. Volume averaging artifact versus possible small hygroma over the right convexity. MRI may provide better evaluation if clinically indicated. 2. Mild age-related atrophy and chronic microvascular ischemic changes. 3. No acute/traumatic cervical  spine pathology. Electronically Signed   By: Anner Crete M.D.   On: 08/15/2022 21:33   DG Elbow Complete Right  Result Date: 08/15/2022 CLINICAL DATA:  Recent fall with right elbow pain, initial encounter EXAM: RIGHT ELBOW - COMPLETE 3+ VIEW COMPARISON:  None Available. FINDINGS: Degenerative changes are noted about the elbow joint. No acute fracture or dislocation is noted. No sizable joint effusion is seen. IMPRESSION: Degenerative change without acute abnormality. Electronically Signed   By: Inez Catalina M.D.   On: 08/15/2022 21:12   DG  Wrist Complete Right  Result Date: 08/15/2022 CLINICAL DATA:  Recent fall with right wrist pain, initial encounter EXAM: RIGHT WRIST - COMPLETE 3+ VIEW COMPARISON:  None Available. FINDINGS: No acute fracture or dislocation is noted. Mild degenerative changes are noted in the intercarpal rows. Mild widening of the scapholunate space is noted which may be related to ligamentous injury. No soft tissue changes are seen. IMPRESSION: Degenerative change with widening of and 8 space as described. No acute fracture is noted. Electronically Signed   By: Inez Catalina M.D.   On: 08/15/2022 21:10   DG Hand Complete Right  Result Date: 08/15/2022 CLINICAL DATA:  Recent fall with hand pain, initial encounter EXAM: RIGHT HAND - COMPLETE 3+ VIEW COMPARISON:  None Available. FINDINGS: No acute bony abnormality is noted. No radiopaque foreign body is seen. No soft tissue abnormality is seen. IMPRESSION: No acute abnormality noted. Electronically Signed   By: Inez Catalina M.D.   On: 08/15/2022 21:08    Procedures Procedures    Medications Ordered in ED Medications  Tdap (BOOSTRIX) injection 0.5 mL (has no administration in time range)  bacitracin ointment (has no administration in time range)    ED Course/ Medical Decision Making/ A&P                             Medical Decision Making Risk OTC drugs.   Patient is here with a mechanical fall and head injury and wrist injury.  X-rays and CT scans were ordered proceed with interpreted, showing no acute traumatic injuries.  Patient has multiple skin tears which are superficial and not amenable to suturing.  These were irrigated and dressed at the bedside.  His wife will take him home.        Final Clinical Impression(s) / ED Diagnoses Final diagnoses:  Injury of head, initial encounter  Skin abrasion    Rx / DC Orders ED Discharge Orders     None         Wyvonnia Dusky, MD 08/15/22 2344

## 2022-08-15 NOTE — Discharge Instructions (Addendum)
Keep the bandages on his wounds for the next 2 days.  After that you can take down the bandages and wash his wounds gently with soap and water.  You can continue to apply Band-Aid as needed until the wound fully heals which can take 1 to 2 weeks for the skin tears.  He do not need to put peroxide on these wounds.  If you choose to you can place bacitracin antibiotic ointment once a day on the wounds, but it is also okay to just while she is normally with soap and water twice a day.

## 2022-08-15 NOTE — ED Triage Notes (Signed)
Pt here from [place] with c/o trip and fall this evening hitting his head , no loc no thinners , has lac to the back left side of his head bleeding controlled

## 2022-08-17 ENCOUNTER — Encounter: Payer: Self-pay | Admitting: Internal Medicine

## 2022-08-19 ENCOUNTER — Telehealth: Payer: Self-pay

## 2022-08-19 NOTE — Progress Notes (Signed)
Subjective:    Patient ID: [name], male    DOB: 03-06-1928, 89 y.o.   MRN: [id number]      HPI [facility] is here for  Chief Complaint  Patient presents with   Referral    2/18 went to the ED after fall -hit left side of head and injured r wrist/hand.  Imaging neg for acute injury.  Has fallen a few times.  He does have some skin tears and they are currently wrapped.  He is seeing the nurse at [facility] and she is monitoring the wounds and changing the bandages for him.  He has poor balance and a history of recurrent falls.  He has fallen several times in the last year.  His wife really feels like he needs a walker, but he does not feel the same.  He has done physical therapy in the past, but not really like it and did not do the exercises that he should be doing.   Medications and allergies reviewed with patient and updated if appropriate.  Current Outpatient Medications on File Prior to Visit  Medication Sig Dispense Refill   amLODipine (NORVASC) 2.5 MG tablet Take 2.5 mg by mouth daily.     Cholecalciferol (VITAMIN D) 2000 UNITS CAPS Take 2 capsules by mouth daily.     escitalopram (LEXAPRO) 5 MG tablet Take 1 tablet (5 mg total) by mouth daily. 30 tablet 5   ferrous sulfate 325 (65 FE) MG EC tablet Take 1 tablet (325 mg total) by mouth every other day. 15 tablet 3   furosemide (LASIX) 40 MG tablet Take 1 tablet (40 mg total) by mouth daily. Take 1 tablet by mouth as needed for fluid retention (once weekly on Sundays). 10 tablet 5   levothyroxine (SYNTHROID) 50 MCG tablet Take 1 tablet (50 mcg total) by mouth daily. 90 tablet 3   polycarbophil (FIBERCON) 625 MG tablet Take 625 mg by mouth daily.     polyethylene glycol (MIRALAX / GLYCOLAX) 17 g packet Take 17 g by mouth 2 (two) times daily. Take 1 to 2 capfuls daily at noon 14 each 0   Probiotic Product (ALIGN) 4 MG CAPS Take 1 capsule (4 mg total) by mouth daily.     rosuvastatin (CRESTOR) 40 MG tablet TAKE ONE TABLET BY  MOUTH EVERY DAY 90 tablet 0   traZODone (DESYREL) 100 MG tablet TAKE ONE TABLET AT BEDTIME. 90 tablet 3   No current facility-administered medications on file prior to visit.    Review of Systems  Constitutional:  Negative for fever.  Eyes:  Negative for visual disturbance.  Gastrointestinal:  Negative for nausea.  Musculoskeletal:  Negative for neck pain.  Neurological:  Positive for light-headedness. Negative for dizziness, numbness and headaches.       Objective:   Vitals:   08/20/22 0809  BP: 122/80  Pulse: (!) 59  Temp: 98.2 F (36.8 C)  SpO2: 98%   BP Readings from Last 3 Encounters:  08/20/22 122/80  08/15/22 (!) 189/78  07/21/22 136/80   Wt Readings from Last 3 Encounters:  08/20/22 189 lb (85.7 kg)  07/21/22 190 lb (86.2 kg)  07/07/22 187 lb (84.8 kg)   Body mass index is 27.12 kg/m.    Physical Exam Constitutional:      General: He is not in acute distress.    Appearance: Normal appearance. He is not ill-appearing.  HENT:     Head: Normocephalic and atraumatic.  Eyes:     Conjunctiva/sclera:  Conjunctivae normal.  Cardiovascular:     Rate and Rhythm: Normal rate and regular rhythm.     Heart sounds: Normal heart sounds.  Pulmonary:     Effort: Pulmonary effort is normal. No respiratory distress.     Breath sounds: Normal breath sounds. No wheezing or rales.  Musculoskeletal:     Right lower leg: No edema.     Left lower leg: No edema.  Skin:    General: Skin is warm and dry.     Findings: No rash.     Comments: Skin tears that are bandaged on left side of head, right posterior wrist and right posterior hand  Neurological:     Mental Status: He is alert. Mental status is at baseline.  Psychiatric:        Mood and Affect: Mood normal.            Assessment & Plan:    See Problem List for Assessment and Plan of chronic medical problems.

## 2022-08-19 NOTE — Patient Instructions (Addendum)
    [facility] Address: [address], [place], [place] [address]    A walker was ordered   A referral was ordered for physical therapy.     Someone will call you to schedule an appointment.

## 2022-08-19 NOTE — Telephone Encounter (Signed)
     Patient  visit on 2/18  at [facility]  Have you been able to follow up with your primary care physician? Yes   The patient was or was not able to obtain any needed medicine or equipment. Yes   Are there diet recommendations that you are having difficulty following? Na   Patient expresses understanding of discharge instructions and education provided has no other needs at this time.  Yes      [facility] [phone] [address], [place], [place] [address] Phone: [phone] Email: [email or web address]

## 2022-08-20 ENCOUNTER — Ambulatory Visit (INDEPENDENT_AMBULATORY_CARE_PROVIDER_SITE_OTHER): Payer: PPO | Admitting: Internal Medicine

## 2022-08-20 ENCOUNTER — Ambulatory Visit: Payer: PPO | Admitting: Internal Medicine

## 2022-08-20 ENCOUNTER — Encounter: Payer: Self-pay | Admitting: Internal Medicine

## 2022-08-20 VITALS — BP 122/80 | HR 59 | Temp 98.2°F | Ht 70.0 in | Wt 189.0 lb

## 2022-08-20 DIAGNOSIS — E1122 Type 2 diabetes mellitus with diabetic chronic kidney disease: Secondary | ICD-10-CM | POA: Diagnosis not present

## 2022-08-20 DIAGNOSIS — R296 Repeated falls: Secondary | ICD-10-CM | POA: Diagnosis not present

## 2022-08-20 DIAGNOSIS — T148XXA Other injury of unspecified body region, initial encounter: Secondary | ICD-10-CM | POA: Insufficient documentation

## 2022-08-20 DIAGNOSIS — I10 Essential (primary) hypertension: Secondary | ICD-10-CM

## 2022-08-20 DIAGNOSIS — S61501A Unspecified open wound of right wrist, initial encounter: Secondary | ICD-10-CM | POA: Diagnosis not present

## 2022-08-20 DIAGNOSIS — I129 Hypertensive chronic kidney disease with stage 1 through stage 4 chronic kidney disease, or unspecified chronic kidney disease: Secondary | ICD-10-CM | POA: Diagnosis not present

## 2022-08-20 DIAGNOSIS — R2689 Other abnormalities of gait and mobility: Secondary | ICD-10-CM

## 2022-08-20 DIAGNOSIS — N184 Chronic kidney disease, stage 4 (severe): Secondary | ICD-10-CM | POA: Diagnosis not present

## 2022-08-20 NOTE — Assessment & Plan Note (Signed)
Chronic Blood pressure is well-controlled Continue current medication including amlodipine 2.5 mg daily

## 2022-08-20 NOTE — Assessment & Plan Note (Signed)
New Secondary to recent fall-was evaluated in the emergency room Has skin tears on his left side of his head, right posterior wrist and right posterior hand All currently bandaged He is seeing the nurse at [facility] who is monitoring the wounds and reviewed managing them so we will not look at them today No concerning symptoms of the fall

## 2022-08-20 NOTE — Assessment & Plan Note (Signed)
Chronic Poor balance and recurrent falls He definitely needs to be using a walker when he walks His wife feels he would do best with a platform walker which I think we can try-will order one for him I do think an evaluation with physical therapy for instructions on how to use the walker and preventing falls would be beneficial-referral to physical therapy They can also help determine if the platform walker is really the best type walker for him Discussed the importance of preventing falls-I think most of the falls are mechanical in nature and may be some weakness.  He would benefit from strengthening exercises, but I do not think that he will comply with this

## 2022-08-21 LAB — LAB REPORT - SCANNED: A1c: 13.2

## 2022-08-24 ENCOUNTER — Telehealth: Payer: Self-pay | Admitting: Internal Medicine

## 2022-08-24 NOTE — Telephone Encounter (Signed)
Zoe from [employer] - needs you to call her - [phone] - she needs to know what side the platform of the walker needs to be on - left or right and she also need the clinical notes for this.  Fax to:  [phone]

## 2022-08-25 NOTE — Telephone Encounter (Signed)
Spoke with Caryl Pina today from [facility].  Clinicals faxed because they can not see notes on Epic and have limited access.

## 2022-08-25 NOTE — Telephone Encounter (Signed)
Either side is okay.

## 2022-10-04 ENCOUNTER — Other Ambulatory Visit: Payer: Self-pay | Admitting: Internal Medicine

## 2022-10-15 ENCOUNTER — Other Ambulatory Visit: Payer: Self-pay | Admitting: Internal Medicine

## 2022-10-26 DIAGNOSIS — B351 Tinea unguium: Secondary | ICD-10-CM | POA: Diagnosis not present

## 2022-10-26 DIAGNOSIS — E1159 Type 2 diabetes mellitus with other circulatory complications: Secondary | ICD-10-CM | POA: Diagnosis not present

## 2022-10-26 DIAGNOSIS — L84 Corns and callosities: Secondary | ICD-10-CM | POA: Diagnosis not present

## 2022-11-05 DIAGNOSIS — E1122 Type 2 diabetes mellitus with diabetic chronic kidney disease: Secondary | ICD-10-CM | POA: Diagnosis not present

## 2022-11-05 DIAGNOSIS — N184 Chronic kidney disease, stage 4 (severe): Secondary | ICD-10-CM | POA: Diagnosis not present

## 2022-11-05 DIAGNOSIS — I129 Hypertensive chronic kidney disease with stage 1 through stage 4 chronic kidney disease, or unspecified chronic kidney disease: Secondary | ICD-10-CM | POA: Diagnosis not present

## 2022-11-10 ENCOUNTER — Other Ambulatory Visit: Payer: Self-pay | Admitting: Internal Medicine

## 2022-11-11 ENCOUNTER — Telehealth: Payer: Self-pay

## 2022-11-11 NOTE — Telephone Encounter (Signed)
Contacted [name] to schedule their annual wellness visit. Appointment made for 11/24/22.

## 2022-11-24 ENCOUNTER — Ambulatory Visit (INDEPENDENT_AMBULATORY_CARE_PROVIDER_SITE_OTHER): Payer: PPO

## 2022-11-24 VITALS — Ht 70.0 in | Wt 182.0 lb

## 2022-11-24 DIAGNOSIS — Z0000 Encounter for general adult medical examination without abnormal findings: Secondary | ICD-10-CM

## 2022-11-24 NOTE — Patient Instructions (Addendum)
[name] , Thank you for taking time to come for your Medicare Wellness Visit. I appreciate your ongoing commitment to your health goals. Please review the following plan we discussed and let me know if I can assist you in the future.   These are the goals we discussed:  Goals       Diabetes Patient Goals: (pt-stated)      The 7 areas that you can work on to improve your diabetes are:  Healthy Eating - Decrease portions, eat at regular times, add whole grains, fruits and vegetables.  Being Active - Increase activity by walking, swimming or biking, take the stairs, park farther away.  Monitoring - Check blood sugar, record results, keep a journal. Monitoring can also include weight and blood pressure.  Taking Medicines - Take the right amount at the right time, learn how your medicine works and what the side effects are.  Problem Solving - Take care of high and low blood sugars, sick days, know who to call and when.  Reducing Risks - Stop smoking and start preventive care: get dilated eye exams, foot care, flu and pneumonia shots, and dental care.  Healthy Coping - Know when to ask for help and who you can talk to when you feel stressed or overwhelmed.  Pick something important to you and break your larger goals down into small steps that you can really achieve! Caring for diabetes takes time and commitment, but it's worth it!  Keep in mind that change takes time. Once you choose a goal, break down the steps you will take this week to reach that goal.  Think of these as experiments, or things you will try to see if they work.  Think about why and what you will do differently next week if something didn't work. Be patient with yourself and take it one day at a time.      My goal for 2024 is to continue to exercise by walking and going to the gym everyday.        This is a list of the screening recommended for you and due dates:  Health Maintenance  Topic Date Due   Complete foot exam    05/25/2019   COVID-19 Vaccine (4 - 2023-24 season) 02/26/2022   Eye exam for diabetics  05/15/2022   Flu Shot  01/27/2023   Hemoglobin A1C  02/19/2023   Medicare Annual Wellness Visit  11/24/2023   DTaP/Tdap/Td vaccine (6 - Td or Tdap) 12/03/2026   Pneumonia Vaccine  Completed   Zoster (Shingles) Vaccine  Completed   HPV Vaccine  Aged Out    Advanced directives: Yes; Documents are on file.  Conditions/risks identified: Yes; Uncontrolled Type II Diabetes  Next appointment: Follow up in one year for your annual wellness visit.   Preventive Care 66 Years and Older, Male  Preventive care refers to lifestyle choices and visits with your health care provider that can promote health and wellness. What does preventive care include? A yearly physical exam. This is also called an annual well check. Dental exams once or twice a year. Routine eye exams. Ask your health care provider how often you should have your eyes checked. Personal lifestyle choices, including: Daily care of your teeth and gums. Regular physical activity. Eating a healthy diet. Avoiding tobacco and drug use. Limiting alcohol use. Practicing safe sex. Taking low doses of aspirin every day. Taking vitamin and mineral supplements as recommended by your health care provider. What happens during an annual  well check? The services and screenings done by your health care provider during your annual well check will depend on your age, overall health, lifestyle risk factors, and family history of disease. Counseling  Your health care provider may ask you questions about your: Alcohol use. Tobacco use. Drug use. Emotional well-being. Home and relationship well-being. Sexual activity. Eating habits. History of falls. Memory and ability to understand (cognition). Work and work Astronomer. Screening  You may have the following tests or measurements: Height, weight, and BMI. Blood pressure. Lipid and cholesterol  levels. These may be checked every 5 years, or more frequently if you are over 45 years old. Skin check. Lung cancer screening. You may have this screening every year starting at age 39 if you have a 30-pack-year history of smoking and currently smoke or have quit within the past 15 years. Fecal occult blood test (FOBT) of the stool. You may have this test every year starting at age 108. Flexible sigmoidoscopy or colonoscopy. You may have a sigmoidoscopy every 5 years or a colonoscopy every 10 years starting at age 45. Prostate cancer screening. Recommendations will vary depending on your family history and other risks. Hepatitis C blood test. Hepatitis B blood test. Sexually transmitted disease (STD) testing. Diabetes screening. This is done by checking your blood sugar (glucose) after you have not eaten for a while (fasting). You may have this done every 1-3 years. Abdominal aortic aneurysm (AAA) screening. You may need this if you are a current or former smoker. Osteoporosis. You may be screened starting at age 30 if you are at high risk. Talk with your health care provider about your test results, treatment options, and if necessary, the need for more tests. Vaccines  Your health care provider may recommend certain vaccines, such as: Influenza vaccine. This is recommended every year. Tetanus, diphtheria, and acellular pertussis (Tdap, Td) vaccine. You may need a Td booster every 10 years. Zoster vaccine. You may need this after age 58. Pneumococcal 13-valent conjugate (PCV13) vaccine. One dose is recommended after age 21. Pneumococcal polysaccharide (PPSV23) vaccine. One dose is recommended after age 49. Talk to your health care provider about which screenings and vaccines you need and how often you need them. This information is not intended to replace advice given to you by your health care provider. Make sure you discuss any questions you have with your health care provider. Document  Released: 07/11/2015 Document Revised: 03/03/2016 Document Reviewed: 04/15/2015 Elsevier Interactive Patient Education  2017 [employer].  Fall Prevention in the Home Falls can cause injuries. They can happen to people of all ages. There are many things you can do to make your home safe and to help prevent falls. What can I do on the outside of my home? Regularly fix the edges of walkways and driveways and fix any cracks. Remove anything that might make you trip as you walk through a door, such as a raised step or threshold. Trim any bushes or trees on the path to your home. Use bright outdoor lighting. Clear any walking paths of anything that might make someone trip, such as rocks or tools. Regularly check to see if handrails are loose or broken. Make sure that both sides of any steps have handrails. Any raised decks and porches should have guardrails on the edges. Have any leaves, snow, or ice cleared regularly. Use sand or salt on walking paths during winter. Clean up any spills in your garage right away. This includes oil or grease spills. What  can I do in the bathroom? Use night lights. Install grab bars by the toilet and in the tub and shower. Do not use towel bars as grab bars. Use non-skid mats or decals in the tub or shower. If you need to sit down in the shower, use a plastic, non-slip stool. Keep the floor dry. Clean up any water that spills on the floor as soon as it happens. Remove soap buildup in the tub or shower regularly. Attach bath mats securely with double-sided non-slip rug tape. Do not have throw rugs and other things on the floor that can make you trip. What can I do in the bedroom? Use night lights. Make sure that you have a light by your bed that is easy to reach. Do not use any sheets or blankets that are too big for your bed. They should not hang down onto the floor. Have a firm chair that has side arms. You can use this for support while you get dressed. Do  not have throw rugs and other things on the floor that can make you trip. What can I do in the kitchen? Clean up any spills right away. Avoid walking on wet floors. Keep items that you use a lot in easy-to-reach places. If you need to reach something above you, use a strong step stool that has a grab bar. Keep electrical cords out of the way. Do not use floor polish or wax that makes floors slippery. If you must use wax, use non-skid floor wax. Do not have throw rugs and other things on the floor that can make you trip. What can I do with my stairs? Do not leave any items on the stairs. Make sure that there are handrails on both sides of the stairs and use them. Fix handrails that are broken or loose. Make sure that handrails are as long as the stairways. Check any carpeting to make sure that it is firmly attached to the stairs. Fix any carpet that is loose or worn. Avoid having throw rugs at the top or bottom of the stairs. If you do have throw rugs, attach them to the floor with carpet tape. Make sure that you have a light switch at the top of the stairs and the bottom of the stairs. If you do not have them, ask someone to add them for you. What else can I do to help prevent falls? Wear shoes that: Do not have high heels. Have rubber bottoms. Are comfortable and fit you well. Are closed at the toe. Do not wear sandals. If you use a stepladder: Make sure that it is fully opened. Do not climb a closed stepladder. Make sure that both sides of the stepladder are locked into place. Ask someone to hold it for you, if possible. Clearly mark and make sure that you can see: Any grab bars or handrails. First and last steps. Where the edge of each step is. Use tools that help you move around (mobility aids) if they are needed. These include: Canes. Walkers. Scooters. Crutches. Turn on the lights when you go into a dark area. Replace any light bulbs as soon as they burn out. Set up your  furniture so you have a clear path. Avoid moving your furniture around. If any of your floors are uneven, fix them. If there are any pets around you, be aware of where they are. Review your medicines with your doctor. Some medicines can make you feel dizzy. This can increase your chance of  falling. Ask your doctor what other things that you can do to help prevent falls. This information is not intended to replace advice given to you by your health care provider. Make sure you discuss any questions you have with your health care provider. Document Released: 04/10/2009 Document Revised: 11/20/2015 Document Reviewed: 07/19/2014 Elsevier Interactive Patient Education  2017 [employer].

## 2022-11-24 NOTE — Progress Notes (Signed)
I connected with  [name] on 11/24/22 by a audio enabled telemedicine application and verified that I am speaking with the correct person using two identifiers.  Patient Location: Home  Provider Location: Office/Clinic  I discussed the limitations of evaluation and management by telemedicine. The patient expressed understanding and agreed to proceed.  Subjective:   [name] is a 89 y.o. male who presents for Medicare Annual/Subsequent preventive examination.  Review of Systems     Cardiac Risk Factors include: advanced age (>65men, >53 women);dyslipidemia;hypertension;male gender     Objective:    Today's Vitals   11/24/22 1048  Weight: 182 lb (82.6 kg)  Height: 5\' 10"  (1.778 m)  PainSc: 0-No pain   Body mass index is 26.11 kg/m.     11/24/2022   10:50 AM 03/23/2022   10:03 AM 11/09/2021    9:44 AM 04/06/2021   10:11 AM 03/30/2021   10:50 AM 09/09/2020    1:12 PM 07/23/2020   11:32 AM  Advanced Directives  Does Patient Have a Medical Advance Directive? Yes No Yes Yes Yes Yes Yes  Type of Estate agent of [place];Living will  Healthcare Power of [place];Living will Living will  Healthcare Power of [place];Living will Out of facility DNR (pink MOST or yellow form)  Does patient want to make changes to medical advance directive? No - Patient declined   No - Patient declined  No - Patient declined No - Patient declined  Copy of Healthcare Power of Attorney in Chart? Yes - validated most recent copy scanned in chart (See row information)  Yes - validated most recent copy scanned in chart (See row information)   Yes - validated most recent copy scanned in chart (See row information)   Would patient like information on creating a medical advance directive?  No - Patient declined       Pre-existing out of facility DNR order (yellow form or pink MOST form)       Pink MOST/Yellow Form most recent copy in chart - Physician notified to receive  inpatient order    Current Medications (verified) Outpatient Encounter Medications as of 11/24/2022  Medication Sig   amLODipine (NORVASC) 2.5 MG tablet Take 2.5 mg by mouth daily.   Cholecalciferol (VITAMIN D) 2000 UNITS CAPS Take 2 capsules by mouth daily.   escitalopram (LEXAPRO) 5 MG tablet Take 1 tablet (5 mg total) by mouth daily.   ferrous sulfate 325 (65 FE) MG EC tablet Take 1 tablet (325 mg total) by mouth every other day.   furosemide (LASIX) 40 MG tablet Take 1 tablet (40 mg total) by mouth daily. Take 1 tablet by mouth as needed for fluid retention (once weekly on Sundays).   levothyroxine (SYNTHROID) 50 MCG tablet Take 1 tablet (50 mcg total) by mouth daily.   polycarbophil (FIBERCON) 625 MG tablet Take 625 mg by mouth daily.   polyethylene glycol (MIRALAX / GLYCOLAX) 17 g packet Take 17 g by mouth 2 (two) times daily. Take 1 to 2 capfuls daily at noon   Probiotic Product (ALIGN) 4 MG CAPS Take 1 capsule (4 mg total) by mouth daily.   rosuvastatin (CRESTOR) 40 MG tablet TAKE ONE TABLET BY MOUTH EVERY DAY   traZODone (DESYREL) 100 MG tablet TAKE ONE TABLET AT BEDTIME.   No facility-administered encounter medications on file as of 11/24/2022.    Allergies (verified) Sulfa antibiotics and Sulfonamide derivatives   History: Past Medical History:  Diagnosis Date   Anxiety  BRADYCARDIA 11/13/2008   Cataract    Cholelithiasis    COLONIC POLYPS, HX OF 02/04/2007   DEPRESSION 02/04/2007   DIABETES MELLITUS, TYPE II 02/04/2007   DIVERTICULOSIS, COLON 02/04/2007   FOREIGN BODY, ASPIRATION 12/04/2007   GERD (gastroesophageal reflux disease)    GLAUCOMA 03/08/2009   Hepatitis A 1963   HYPERLIPIDEMIA 12/04/2007   HYPERTENSION 02/04/2007   Memory loss    OBSTRUCTIVE SLEEP APNEA 11/05/2009   -PSG 01/05/10 RDI 11, PLMI 56   OSTEOARTHRITIS 02/04/2007   Peptic stricture of esophagus    PROSTATE CANCER 1997   RENAL INSUFFICIENCY 11/13/2008   SYNCOPE 12/31/2009   THROMBOCYTOPENIA 04/17/2008    THYROID NODULE, RIGHT 11/13/2008   UNSPECIFIED ANEMIA 11/07/2007   Past Surgical History:  Procedure Laterality Date   Heart Cartherization  2000   PROSTATECTOMY  1997   TONSILLECTOMY  1940's   Family History  Problem Relation Age of Onset   Kidney disease Father        Bright's Disease - died at age 11   Other Mother        no signifincant health problems - died at age 58   Heart disease Neg Hx        No FH of Coronary Artery Disease   Cancer Neg Hx    Colon cancer Neg Hx    Esophageal cancer Neg Hx    Inflammatory bowel disease Neg Hx    Liver disease Neg Hx    Pancreatic cancer Neg Hx    Rectal cancer Neg Hx    Stomach cancer Neg Hx    Social History   Socioeconomic History   Marital status: Married    Spouse name: Randa Evens   Number of children: 3   Years of education: college   Highest education level: Bachelor's degree (e.g., BA, AB, BS)  Occupational History   Occupation: Database administrator company--president    Comment: Retired  Tobacco Use   Smoking status: Former    Types: Cigarettes, Pipe    Quit date: 07/08/1993    Years since quitting: 29.4   Smokeless tobacco: Never  Vaping Use   Vaping Use: Never used  Substance and Sexual Activity   Alcohol use: Yes    Comment: minimal   Drug use: No   Sexual activity: Not Currently  Other Topics Concern   Not on file  Social History Narrative   He played tennis 3x a week before moving to [place].   Right-handed.   2 cups caffeine per day.      Social Determinants of Health   Financial Resource Strain: Low Risk  (11/24/2022)   Overall Financial Resource Strain (CARDIA)    Difficulty of Paying Living Expenses: Not hard at all  Food Insecurity: No Food Insecurity (11/24/2022)   Hunger Vital Sign    Worried About Running Out of Food in the Last Year: Never true    Ran Out of Food in the Last Year: Never true  Transportation Needs: No Transportation Needs (11/24/2022)   PRAPARE - Scientist, research (physical sciences) (Medical): No    Lack of Transportation (Non-Medical): No  Physical Activity: Sufficiently Active (11/24/2022)   Exercise Vital Sign    Days of Exercise per Week: 7 days    Minutes of Exercise per Session: 30 min  Stress: No Stress Concern Present (11/24/2022)   [employer] of Occupational Health - Occupational Stress Questionnaire    Feeling of Stress : Not at all  Social Connections: Socially Integrated (  11/24/2022)   Social Connection and Isolation Panel [NHANES]    Frequency of Communication with Friends and Family: More than three times a week    Frequency of Social Gatherings with Friends and Family: More than three times a week    Attends Religious Services: More than 4 times per year    Active Member of [employer] or Organizations: Yes    Attends Engineer, structural: More than 4 times per year    Marital Status: Married    Tobacco Counseling Counseling given: Not Answered   Clinical Intake:  Pre-visit preparation completed: Yes  Pain : No/denies pain Pain Score: 0-No pain     BMI - recorded: 26.11 Nutritional Status: BMI 25 -29 Overweight Nutritional Risks: None Diabetes: No  How often do you need to have someone help you when you read instructions, pamphlets, or other written materials from your doctor or pharmacy?: 1 - Never What is the last grade level you completed in school?: [employer]  Nutrition Risk Assessment:  Has the patient had any N/V/D within the last 2 months?  No  Does the patient have any non-healing wounds?  No  Has the patient had any unintentional weight loss or weight gain?  No   Diabetes:  Is the patient diabetic?  Yes  If diabetic, was a CBG obtained today?  No  Did the patient bring in their glucometer from home?  No  How often do you monitor your CBG's? None.  HgA1C is 13.200 as of 08/21/2022  Financial Strains and Diabetes Management:  Are you having any financial strains with the device, your  supplies or your medication? No .  Does the patient want to be seen by Chronic Care Management for management of their diabetes?  No  Would the patient like to be referred to a Nutritionist or for Diabetic Management?  No   Diabetic Exams:  Diabetic Eye Exam: Overdue for diabetic eye exam. Pt has been advised about the importance in completing this exam. Patient advised to call and schedule an eye exam. Diabetic Foot Exam: Overdue, Pt has been advised about the importance in completing this exam. Pt is scheduled for diabetic foot exam on 12/09/2022.   Interpreter Needed?: No  Information entered by :: Zoria Rawlinson N. Elsbeth Yearick, LPN.   Activities of Daily Living    11/24/2022   10:55 AM 03/23/2022    5:15 PM  In your present state of health, do you have any difficulty performing the following activities:  Hearing? 1 1  Vision? 0 0  Difficulty concentrating or making decisions? 0 0  Walking or climbing stairs? 0 1  Dressing or bathing? 0 0  Doing errands, shopping? 0 1  Preparing Food and eating ? N   Using the Toilet? N   In the past six months, have you accidently leaked urine? N   Do you have problems with loss of bowel control? N   Managing your Medications? N   Managing your Finances? N   Housekeeping or managing your Housekeeping? N     Patient Care Team: Pincus Sanes, MD as PCP - General (Internal Medicine) Tonny Bollman, MD as PCP - Cardiology (Cardiology) Exie Parody, MD (Hematology and Oncology) [place], Amy, MD as Consulting Physician (Dermatology)  Indicate any recent Medical Services you may have received from other than [facility] providers in the past year (date may be approximate).     Assessment:   This is a routine wellness examination for [name].  Hearing/Vision  screen Hearing Screening - Comments:: Patient wears hearing aids. Vision Screening - Comments:: Wears rx glasses - Maris Berger, MD   Dietary issues and exercise activities discussed: Current  Exercise Habits: Home exercise routine;Structured exercise class (Lives at [facility] which has a gym on campus), Type of exercise: walking, Time (Minutes): 25, Frequency (Times/Week): 7, Weekly Exercise (Minutes/Week): 175, Intensity: Moderate, Exercise limited by: orthopedic condition(s)   Goals Addressed               This Visit's Progress     Diabetes Patient Goals: (pt-stated)        The 7 areas that you can work on to improve your diabetes are:  Healthy Eating - Decrease portions, eat at regular times, add whole grains, fruits and vegetables.  Being Active - Increase activity by walking, swimming or biking, take the stairs, park farther away.  Monitoring - Check blood sugar, record results, keep a journal. Monitoring can also include weight and blood pressure.  Taking Medicines - Take the right amount at the right time, learn how your medicine works and what the side effects are.  Problem Solving - Take care of high and low blood sugars, sick days, know who to call and when.  Reducing Risks - Stop smoking and start preventive care: get dilated eye exams, foot care, flu and pneumonia shots, and dental care.  Healthy Coping - Know when to ask for help and who you can talk to when you feel stressed or overwhelmed.  Pick something important to you and break your larger goals down into small steps that you can really achieve! Caring for diabetes takes time and commitment, but it's worth it!  Keep in mind that change takes time. Once you choose a goal, break down the steps you will take this week to reach that goal.  Think of these as experiments, or things you will try to see if they work.  Think about why and what you will do differently next week if something didn't work. Be patient with yourself and take it one day at a time.      My goal for 2024 is to continue to exercise by walking and going to the gym everyday.        Depression Screen    11/24/2022   10:54 AM 08/20/2022     8:08 AM 07/21/2022    3:23 PM 07/07/2022    9:29 AM 11/09/2021    9:43 AM 09/09/2020    1:08 PM 07/23/2020   11:32 AM  PHQ 2/9 Scores  PHQ - 2 Score 0 0 0 0 0 0 0  PHQ- 9 Score 0 0 0 0       Fall Risk    11/24/2022   10:53 AM 07/21/2022    3:23 PM 07/07/2022    9:28 AM 11/09/2021    9:46 AM 04/06/2021   10:11 AM  Fall Risk   Falls in the past year? 1 0 0 1 1  Number falls in past yr: 0 0 0 0 0  Injury with Fall? 1 0 0 0 0  Risk for fall due to : History of fall(s);Orthopedic patient No Fall Risks No Fall Risks  History of fall(s)  Follow up  Falls evaluation completed Falls evaluation completed Falls prevention discussed Falls evaluation completed    FALL RISK PREVENTION PERTAINING TO THE HOME:  Any stairs in or around the home? Yes  If so, are there any without handrails? No  Home free of  loose throw rugs in walkways, pet beds, electrical cords, etc? Yes  Adequate lighting in your home to reduce risk of falls? Yes   ASSISTIVE DEVICES UTILIZED TO PREVENT FALLS:  Life alert? Yes  Use of a cane, walker or w/c? No  Grab bars in the bathroom? Yes  Shower chair or bench in shower? Yes  Elevated toilet seat or a handicapped toilet? Yes   TIMED UP AND GO:  Was the test performed? No . Telephonic Visit   Cognitive Function:    03/07/2018    2:24 PM  MMSE - Mini Mental State Exam  Orientation to time 5  Orientation to Place 5  Registration 3  Attention/ Calculation 5  Recall 2  Language- name 2 objects 2  Language- repeat 1  Language- follow 3 step command 3  Language- read & follow direction 1  Write a sentence 1  Copy design 1  Total score 29        11/24/2022   10:55 AM 11/09/2021    9:47 AM 09/09/2020    1:09 PM 09/06/2019   11:07 AM  6CIT Screen  What Year? 0 points 0 points 0 points 0 points  What month? 0 points 0 points 0 points 0 points  What time? 0 points 0 points 0 points 0 points  Count back from 20 0 points 0 points 2 points 0 points  Months in  reverse 0 points 0 points 0 points 0 points  Repeat phrase 0 points 0 points 0 points 0 points  Total Score 0 points 0 points 2 points 0 points    Immunizations Immunization History  Administered Date(s) Administered   Fluad Quad(high Dose 65+) 02/27/2019, 04/09/2021, 03/30/2022   Influenza Split 04/05/2011, 04/04/2012   Influenza Whole 03/27/2008, 04/15/2009, 03/20/2010   Influenza, High Dose Seasonal PF 03/31/2016, 04/06/2017, 03/29/2018, 04/25/2020   Influenza,inj,Quad PF,6+ Mos 04/03/2013, 03/14/2015   Influenza-Unspecified 06/29/1999, 03/28/2000, 03/28/2001, 03/29/2003, 04/28/2004, 05/28/2004, 04/25/2005, 04/27/2005, 04/11/2006, 03/29/2007, 05/10/2007, 02/27/2008, 04/07/2011, 04/19/2012, 04/03/2013, 03/28/2014, 02/27/2015, 03/26/2016, 03/20/2017   Moderna Sars-Covid-2 Vaccination 08/10/2019, 09/06/2019, 05/13/2020   Pneumococcal Conjugate-13 05/06/2014, 03/14/2015   Pneumococcal Polysaccharide-23 06/29/1995   Pneumococcal-Unspecified 08/28/2001   Td 11/27/1995, 10/26/2004, 11/13/2004   Tdap 05/06/2014, 12/02/2016   Zoster Recombinat (Shingrix) 12/30/2017, 05/22/2018   Zoster, Live 06/28/2004    TDAP status: Up to date  Flu Vaccine status: Up to date  Pneumococcal vaccine status: Up to date  Covid-19 vaccine status: Completed vaccines  Qualifies for Shingles Vaccine? Yes   Zostavax completed Yes   Shingrix Completed?: Yes  Screening Tests Health Maintenance  Topic Date Due   FOOT EXAM  05/25/2019   COVID-19 Vaccine (4 - 2023-24 season) 02/26/2022   OPHTHALMOLOGY EXAM  05/15/2022   INFLUENZA VACCINE  01/27/2023   HEMOGLOBIN A1C  02/19/2023   Medicare Annual Wellness (AWV)  11/24/2023   DTaP/Tdap/Td (6 - Td or Tdap) 12/03/2026   Pneumonia Vaccine 13+ Years old  Completed   Zoster Vaccines- Shingrix  Completed   HPV VACCINES  Aged Out    Health Maintenance  Health Maintenance Due  Topic Date Due   FOOT EXAM  05/25/2019   COVID-19 Vaccine (4 - 2023-24 season)  02/26/2022   OPHTHALMOLOGY EXAM  05/15/2022    Colorectal cancer screening: No longer required.   Lung Cancer Screening: (Low Dose CT Chest recommended if Age 33-80 years, 30 pack-year currently smoking OR have quit w/in 15years.) does not qualify.   Lung Cancer Screening Referral: No  Additional Screening:  Hepatitis C  Screening: does not qualify; Completed: No  Vision Screening: Recommended annual ophthalmology exams for early detection of glaucoma and other disorders of the eye. Is the patient up to date with their annual eye exam?  No  Who is the provider or what is the name of the office in which the patient attends annual eye exams? Maris Berger, MD and Gae Bon, MD. If pt is not established with a provider, would they like to be referred to a provider to establish care? No .   Dental Screening: Recommended annual dental exams for proper oral hygiene  Community Resource Referral / Chronic Care Management: CRR required this visit?  No   CCM required this visit?  No      Plan:     I have personally reviewed and noted the following in the patient's chart:   Medical and social history Use of alcohol, tobacco or illicit drugs  Current medications and supplements including opioid prescriptions. Patient is not currently taking opioid prescriptions. Functional ability and status Nutritional status Physical activity Advanced directives List of other physicians Hospitalizations, surgeries, and ER visits in previous 12 months Vitals Screenings to include cognitive, depression, and falls Referrals and appointments  In addition, I have reviewed and discussed with patient certain preventive protocols, quality metrics, and best practice recommendations. A written personalized care plan for preventive services as well as general preventive health recommendations were provided to patient.     Mickeal Needy, LPN   1/61/0960   Nurse Notes: None

## 2022-12-01 DIAGNOSIS — D2262 Melanocytic nevi of left upper limb, including shoulder: Secondary | ICD-10-CM | POA: Diagnosis not present

## 2022-12-01 DIAGNOSIS — L570 Actinic keratosis: Secondary | ICD-10-CM | POA: Diagnosis not present

## 2022-12-01 DIAGNOSIS — L723 Sebaceous cyst: Secondary | ICD-10-CM | POA: Diagnosis not present

## 2022-12-01 DIAGNOSIS — D225 Melanocytic nevi of trunk: Secondary | ICD-10-CM | POA: Diagnosis not present

## 2022-12-01 DIAGNOSIS — D692 Other nonthrombocytopenic purpura: Secondary | ICD-10-CM | POA: Diagnosis not present

## 2022-12-01 DIAGNOSIS — Z85828 Personal history of other malignant neoplasm of skin: Secondary | ICD-10-CM | POA: Diagnosis not present

## 2022-12-01 DIAGNOSIS — L821 Other seborrheic keratosis: Secondary | ICD-10-CM | POA: Diagnosis not present

## 2022-12-08 ENCOUNTER — Encounter: Payer: Self-pay | Admitting: Internal Medicine

## 2022-12-08 NOTE — Progress Notes (Signed)
Subjective:    Patient ID: [name], male    DOB: 10/09/1927, 89 y.o.   MRN: [id number]     HPI [name] is here for follow up of his chronic medical problems.  Constipation - takes miralax, align and benefiber (not daily).  Drinking a good amt of fluids.  May have a BM a couple of times a day may skip a day.    On treadmill daily.    Medications and allergies reviewed with patient and updated if appropriate.  Current Outpatient Medications on File Prior to Visit  Medication Sig Dispense Refill   amLODipine (NORVASC) 2.5 MG tablet Take 2.5 mg by mouth daily.     Cholecalciferol (VITAMIN D) 2000 UNITS CAPS Take 2 capsules by mouth daily.     escitalopram (LEXAPRO) 5 MG tablet Take 1 tablet (5 mg total) by mouth daily. 30 tablet 5   ferrous sulfate 325 (65 FE) MG EC tablet Take 1 tablet (325 mg total) by mouth every other day. 15 tablet 3   furosemide (LASIX) 40 MG tablet Take 1 tablet (40 mg total) by mouth daily. Take 1 tablet by mouth as needed for fluid retention (once weekly on Sundays). 10 tablet 5   levothyroxine (SYNTHROID) 50 MCG tablet Take 1 tablet (50 mcg total) by mouth daily. 90 tablet 3   polycarbophil (FIBERCON) 625 MG tablet Take 625 mg by mouth daily.     polyethylene glycol (MIRALAX / GLYCOLAX) 17 g packet Take 17 g by mouth 2 (two) times daily. Take 1 to 2 capfuls daily at noon 14 each 0   Probiotic Product (ALIGN) 4 MG CAPS Take 1 capsule (4 mg total) by mouth daily.     rosuvastatin (CRESTOR) 40 MG tablet TAKE ONE TABLET BY MOUTH EVERY DAY 90 tablet 1   traZODone (DESYREL) 100 MG tablet TAKE ONE TABLET AT BEDTIME. 90 tablet 3   No current facility-administered medications on file prior to visit.     Review of Systems  Constitutional:  Negative for chills and fever.  Respiratory:  Negative for cough, shortness of breath and wheezing.   Cardiovascular:  Negative for chest pain, palpitations and leg swelling.  Neurological:  Positive for  light-headedness (occ lightheadedness when standing). Negative for headaches.       Objective:   Vitals:   12/09/22 0919  BP: 128/74  Pulse: 70  Temp: 98.1 F (36.7 C)  SpO2: 98%   BP Readings from Last 3 Encounters:  12/09/22 128/74  08/20/22 122/80  08/15/22 (!) 189/78   Wt Readings from Last 3 Encounters:  12/09/22 187 lb (84.8 kg)  11/24/22 182 lb (82.6 kg)  08/20/22 189 lb (85.7 kg)   Body mass index is 26.83 kg/m.    Physical Exam Constitutional:      General: He is not in acute distress.    Appearance: Normal appearance. He is not ill-appearing.  HENT:     Head: Normocephalic and atraumatic.  Eyes:     Conjunctiva/sclera: Conjunctivae normal.  Cardiovascular:     Rate and Rhythm: Normal rate and regular rhythm.     Heart sounds: Normal heart sounds.  Pulmonary:     Effort: Pulmonary effort is normal. No respiratory distress.     Breath sounds: Normal breath sounds. No wheezing or rales.  Musculoskeletal:     Right lower leg: No edema.     Left lower leg: No edema.  Skin:    General: Skin is warm and dry.  Findings: No rash.  Neurological:     Mental Status: He is alert. Mental status is at baseline.  Psychiatric:        Mood and Affect: Mood normal.        Lab Results  Component Value Date   WBC 12.8 (H) 07/07/2022   HGB 13.7 07/07/2022   HCT 42.9 07/07/2022   PLT 349.0 07/07/2022   GLUCOSE 97 07/07/2022   CHOL 145 07/07/2022   TRIG 109.0 07/07/2022   HDL 42.80 07/07/2022   LDLCALC 80 07/07/2022   ALT 15 07/07/2022   AST 21 07/07/2022   NA 145 07/07/2022   K 4.8 07/07/2022   CL 107 07/07/2022   CREATININE 2.88 (H) 07/07/2022   BUN 53 (H) 07/07/2022   CO2 28 07/07/2022   TSH 4.56 07/07/2022   PSA 0.00 Repeated and verified X2. (L) 07/07/2022   HGBA1C 6.0 07/07/2022   MICROALBUR 5.2 (H) 03/28/2013   Recent labs reviewed from [facility]  Assessment & Plan:    See Problem List for Assessment and Plan of chronic medical problems.

## 2022-12-08 NOTE — Patient Instructions (Addendum)
        Medications changes include :   none      Return in about 6 months (around 06/10/2023) for follow up - cancel July appt.

## 2022-12-09 ENCOUNTER — Ambulatory Visit (INDEPENDENT_AMBULATORY_CARE_PROVIDER_SITE_OTHER): Payer: PPO | Admitting: Internal Medicine

## 2022-12-09 VITALS — BP 128/74 | HR 70 | Temp 98.1°F | Ht 70.0 in | Wt 187.0 lb

## 2022-12-09 DIAGNOSIS — I10 Essential (primary) hypertension: Secondary | ICD-10-CM

## 2022-12-09 DIAGNOSIS — N184 Chronic kidney disease, stage 4 (severe): Secondary | ICD-10-CM | POA: Diagnosis not present

## 2022-12-09 DIAGNOSIS — E1122 Type 2 diabetes mellitus with diabetic chronic kidney disease: Secondary | ICD-10-CM | POA: Diagnosis not present

## 2022-12-09 DIAGNOSIS — G4709 Other insomnia: Secondary | ICD-10-CM

## 2022-12-09 DIAGNOSIS — E785 Hyperlipidemia, unspecified: Secondary | ICD-10-CM

## 2022-12-09 DIAGNOSIS — E039 Hypothyroidism, unspecified: Secondary | ICD-10-CM

## 2022-12-09 DIAGNOSIS — R600 Localized edema: Secondary | ICD-10-CM

## 2022-12-09 DIAGNOSIS — R296 Repeated falls: Secondary | ICD-10-CM | POA: Diagnosis not present

## 2022-12-09 DIAGNOSIS — F419 Anxiety disorder, unspecified: Secondary | ICD-10-CM

## 2022-12-09 NOTE — Assessment & Plan Note (Signed)
Chronic   Lab Results  Component Value Date   HGBA1C 6.0 07/07/2022   Sugars well controlled Check A1c Continue lifestyle control Stressed regular exercise, diabetic diet

## 2022-12-09 NOTE — Assessment & Plan Note (Signed)
Chronic Blood pressure is well-controlled Cmp, cbc Continue current medication including amlodipine 2.5 mg daily (decreased by [facility])

## 2022-12-09 NOTE — Assessment & Plan Note (Signed)
Chronic Controlled, Stable Continue trazodone 100 mg at bedtime 

## 2022-12-09 NOTE — Assessment & Plan Note (Signed)
Chronic Controlled, stable Continue lexapro 5 mg daily  

## 2022-12-09 NOTE — Assessment & Plan Note (Signed)
Chronic  Clinically euthyroid Check tsh and will titrate med dose if needed Currently taking levothyroxine 50 mcg daily 

## 2022-12-09 NOTE — Assessment & Plan Note (Signed)
Chronic Has established with Dr. [facility] CMP Encouraged increased water intake

## 2022-12-09 NOTE — Assessment & Plan Note (Addendum)
Chronic Poor balance and recurrent falls - no recent falls Prescribed a walker - does not use Walks daily on treadmill Did not do PT Discussed fall prevention

## 2022-12-09 NOTE — Assessment & Plan Note (Signed)
Chronic Regular exercise and healthy diet encouraged Check lipid panel  Continue Crestor 40 mg daily 

## 2022-12-09 NOTE — Assessment & Plan Note (Signed)
Chronic Overall controlled-legs look good today He does get some swelling that accumulates during the day He is not consistent with elevating his legs during the day when sitting-encouraged him to do this more consistently Has Lasix 40 mg that he can take once a week as needed, but has not taken this 

## 2022-12-13 NOTE — Progress Notes (Signed)
Cardiology Office Note:    Date:  12/20/2022   ID:  [name], DOB [birth year], MRN [id number]  PCP:  Pincus Sanes, MD   [facility] Providers Cardiologist:  Tonny Bollman, MD     Referring MD: Pincus Sanes, MD   Chief Complaint: chronic leg edema, hypertension  History of Present Illness:    [name] is a very pleasant 89 y.o. male with a hx of hypertension, syncope, hyperlipidemia, chronic leg swelling.   Normal stress myoview with no chest pain, no ecg changes and no evidence of ischemia or infarction, EF 62% 03/2005. Has been followed by cardiology for many years for hypertension and syncope.   He was last seen by Dr. Excell Seltzer on 06/08/21 and reported he was experiencing lightheadedness and weakness when going from bed to standing at night to use the bathroom, no symptoms without position change. Was evaluated in the ED Oct 2022 with MRI of brain that did not demonstrate evidence of stroke. Was started on amlodipine 5 mg by Dr. Excell Seltzer for elevated BP as reported by patient's daughter via MyChart on 07/07/21.  I saw him 11/04/21 for evaluation of leg swelling. His friend at [facility] who is an MD and advised that he contact us. Was having bilateral leg swelling that started 6-7 weeks ago, no SOB. Saw PCP, Dr. Lawerance Bach, 3 weeks ago and she advised leg elevation and compression. Admits he loves salty food and has been eating lots of soup. Thinks he has gained about 8 lbs recently (10 lbs on our scale from December 2022). He denied dyspnea, orthopnea, PND, chest pain, presyncope, syncope. Continuing to be physically active at [employer].   Seen by Dr. Excell Seltzer 12/15/21 at which time he reported leg edema improved with reduction in sodium, elevation, and compression socks.  He was given furosemide 40 mg to use as needed for leg edema but advised to use not more than once a week due to renal dysfunction with this recent serum creatinine at 2.33. Recommendation by Dr. Excell Seltzer to allow  slightly higher BP in the setting of CKD and leg edema, may need to reduce or discontinue amlodipine. Advised to return in 3 months for follow-up.   Seen by me on 03/17/22, accompanied by his wife, for follow-up of leg swelling. Feeling well with the exception of bothered by getting up twice during the night to urinate. This started when he began elevating his legs during the night. Not sleeping well.  Wakes up with "skinny" ankles. Wearing compression stockings today. Weight is down on our scale. Now eating low sodium soup and not using the salt shaker. Unsure as to whether he is taking furosemide.  States he takes all of his medications daily. We contacted his pharmacy who advised that furosemide 40 mg #10 tabs was sent to patient in June. Rx was for 1 pill each week. He denied chest pain, shortness of breath, fatigue, palpitations, melena, hematuria, hemoptysis, diaphoresis, weakness, presyncope, syncope, orthopnea, and PND. Walking 30 minutes daily for exercise. His wife reports that he frequently naps with his legs on the floor. Has lounge doctor leg rest, but he is only using it at night. Encouraged weekly furosemide, continue leg elevation and compression, and low sodium diet. Scr was stable at 2.51 at that visit.   Seen in clinic by Dr. Excell Seltzer on 06/09/22 who noted BP 150/60 mmHg with limited management options due to CKD, leg swelling, and bradycardia. Dr. Excell Seltzer recommended continued monitoring and emphasized the importance of leg  elevation and compression stockings as he suspects venous insufficiency main culprit for leg edema.   Today, he is here alone and reports he is doing "very well." He has no specific cardiac concerns.  He continues to walk daily for exercise.  Reports his wife is having some problems with not being able to walk very far without symptoms. Denies concerns voiced by other health care providers. Scr 2.96 from [facility] on 11/06/22. He is unsure if he is taking furosemide  daily or once weekly as prescribed. Leg edema has been stable. He is wearing compression socks today. He denies chest pain, shortness of breath, fatigue, palpitations, melena, hematuria, hemoptysis, diaphoresis, weakness, presyncope, syncope, orthopnea, and PND.   Past Medical History:  Diagnosis Date   Anxiety    BRADYCARDIA 11/13/2008   Cataract    Cholelithiasis    COLONIC POLYPS, HX OF 02/04/2007   DEPRESSION 02/04/2007   DIABETES MELLITUS, TYPE II 02/04/2007   DIVERTICULOSIS, COLON 02/04/2007   FOREIGN BODY, ASPIRATION 12/04/2007   GERD (gastroesophageal reflux disease)    GLAUCOMA 03/08/2009   Hepatitis A 1963   HYPERLIPIDEMIA 12/04/2007   HYPERTENSION 02/04/2007   Memory loss    OBSTRUCTIVE SLEEP APNEA 11/05/2009   -PSG 01/05/10 RDI 11, PLMI 56   OSTEOARTHRITIS 02/04/2007   Peptic stricture of esophagus    PROSTATE CANCER 1997   RENAL INSUFFICIENCY 11/13/2008   SYNCOPE 12/31/2009   THROMBOCYTOPENIA 04/17/2008   THYROID NODULE, RIGHT 11/13/2008   UNSPECIFIED ANEMIA 11/07/2007    Past Surgical History:  Procedure Laterality Date   Heart Cartherization  2000   PROSTATECTOMY  1997   TONSILLECTOMY  1940's    Current Medications: Current Meds  Medication Sig   amLODipine (NORVASC) 2.5 MG tablet Take 2.5 mg by mouth daily.   Cholecalciferol (VITAMIN D) 2000 UNITS CAPS Take 2 capsules by mouth daily.   escitalopram (LEXAPRO) 5 MG tablet Take 1 tablet (5 mg total) by mouth daily.   ferrous sulfate 325 (65 FE) MG EC tablet Take 1 tablet (325 mg total) by mouth every other day.   furosemide (LASIX) 40 MG tablet Take 1 tablet (40 mg total) by mouth daily. Take 1 tablet by mouth as needed for fluid retention (once weekly on Sundays).   levothyroxine (SYNTHROID) 50 MCG tablet Take 1 tablet (50 mcg total) by mouth daily.   polycarbophil (FIBERCON) 625 MG tablet Take 625 mg by mouth daily.   polyethylene glycol (MIRALAX / GLYCOLAX) 17 g packet Take 17 g by mouth 2 (two) times daily. Take 1 to 2  capfuls daily at noon   Probiotic Product (ALIGN) 4 MG CAPS Take 1 capsule (4 mg total) by mouth daily.   rosuvastatin (CRESTOR) 40 MG tablet TAKE ONE TABLET BY MOUTH EVERY DAY   traZODone (DESYREL) 100 MG tablet TAKE ONE TABLET AT BEDTIME.     Allergies:   Sulfa antibiotics and Sulfonamide derivatives   Social History   Socioeconomic History   Marital status: Married    Spouse name: Randa Evens   Number of children: 3   Years of education: college   Highest education level: Bachelor's degree (e.g., BA, AB, BS)  Occupational History   Occupation: Database administrator company--president    Comment: Retired  Tobacco Use   Smoking status: Former    Types: Cigarettes, Pipe    Quit date: 07/08/1993    Years since quitting: 29.4   Smokeless tobacco: Never  Vaping Use   Vaping Use: Never used  Substance and Sexual Activity  Alcohol use: Yes    Comment: minimal   Drug use: No   Sexual activity: Not Currently  Other Topics Concern   Not on file  Social History Narrative   He played tennis 3x a week before moving to [facility].   Right-handed.   2 cups caffeine per day.      Social Determinants of Health   Financial Resource Strain: Low Risk  (11/24/2022)   Overall Financial Resource Strain (CARDIA)    Difficulty of Paying Living Expenses: Not hard at all  Food Insecurity: No Food Insecurity (11/24/2022)   Hunger Vital Sign    Worried About Running Out of Food in the Last Year: Never true    Ran Out of Food in the Last Year: Never true  Transportation Needs: No Transportation Needs (11/24/2022)   PRAPARE - Administrator, Civil Service (Medical): No    Lack of Transportation (Non-Medical): No  Physical Activity: Sufficiently Active (11/24/2022)   Exercise Vital Sign    Days of Exercise per Week: 7 days    Minutes of Exercise per Session: 30 min  Stress: No Stress Concern Present (11/24/2022)   [employer] of Occupational Health - Occupational Stress  Questionnaire    Feeling of Stress : Not at all  Social Connections: Socially Integrated (11/24/2022)   Social Connection and Isolation Panel [NHANES]    Frequency of Communication with Friends and Family: More than three times a week    Frequency of Social Gatherings with Friends and Family: More than three times a week    Attends Religious Services: More than 4 times per year    Active Member of [employer] or Organizations: Yes    Attends Engineer, structural: More than 4 times per year    Marital Status: Married     Family History: The patient's family history includes Kidney disease in his father; Other in his mother. There is no history of Heart disease, Cancer, Colon cancer, Esophageal cancer, Inflammatory bowel disease, Liver disease, Pancreatic cancer, Rectal cancer, or Stomach cancer.  ROS:   Please see the history of present illness.    + bilateral leg edema All other systems reviewed and are negative.  Labs/Other Studies Reviewed:    The following studies were reviewed today:  Echo 07/07/21  1. Left ventricular ejection fraction, by estimation, is 60 to 65%. The  left ventricle has normal function. The left ventricle has no regional  wall motion abnormalities. Left ventricular diastolic parameters are  consistent with Grade I diastolic  dysfunction (impaired relaxation).   2. Right ventricular systolic function is normal. The right ventricular  size is normal.   3. Left atrial size was mildly dilated.   4. The mitral valve is normal in structure. Mild mitral valve  regurgitation. No evidence of mitral stenosis.   5. The aortic valve is tricuspid. There is mild calcification of the  aortic valve. There is mild thickening of the aortic valve. Aortic valve  regurgitation is mild. Aortic valve sclerosis/calcification is present,  without any evidence of aortic  stenosis.   6. Pulmonic valve regurgitation is moderate.   7. The inferior vena cava is normal in size  with greater than 50%  respiratory variability, suggesting right atrial pressure of 3 mmHg.    Cardiac monitor 07/05/21  The basic rhythm is normal sinus with an average HR of 57 bpm No atrial fibrillation or flutter No high-grade heart block or pathologic pauses There are no PVC's and no supraventricular beats  with several short supraventricular runs, the longest lasting 16 seconds at a rate of 99 bpm.    Myocardial Perfusion Study 03/2005  Normal stress myoview with no chest pain, no evidence of ischemia or infarction LVEF 62% and normal wall motion  Recent Labs: 03/27/2022: Magnesium 2.1 07/07/2022: ALT 15; BUN 53; Creatinine, Ser 2.88; Hemoglobin 13.7; Platelets 349.0; Potassium 4.8; Sodium 145; TSH 4.56  Recent Lipid Panel    Component Value Date/Time   CHOL 145 07/07/2022 0956   TRIG 109.0 07/07/2022 0956   TRIG 48 04/05/2006 0750   HDL 42.80 07/07/2022 0956   CHOLHDL 3 07/07/2022 0956   VLDL 21.8 07/07/2022 0956   LDLCALC 80 07/07/2022 0956     Risk Assessment/Calculations:       Physical Exam:    VS:  BP 134/60   Pulse (!) 52   Ht 5\' 10"  (1.778 m)   Wt 185 lb 6.4 oz (84.1 kg)   SpO2 94%   BMI 26.60 kg/m     Wt Readings from Last 3 Encounters:  12/20/22 185 lb 6.4 oz (84.1 kg)  12/09/22 187 lb (84.8 kg)  11/24/22 182 lb (82.6 kg)     GEN:  Well nourished, well developed in no acute distress HEENT: Normal NECK: No JVD; No carotid bruits CARDIAC: RRR, no murmurs, rubs, gallops RESPIRATORY:  Clear to auscultation without rales, wheezing or rhonchi  ABDOMEN: Soft, non-tender, non-distended MUSCULOSKELETAL:  No edema, wearing compression socks. No deformity. 2+ pedal pulses, equal bilaterally SKIN: Warm and dry NEUROLOGIC:  Alert and oriented x 3 PSYCHIATRIC:  Normal affect   EKG:  EKG is ordered today.   EKG Interpretation  Date/Time:  Monday December 20 2022 09:12:04 EDT Ventricular Rate:  52 PR Interval:  248 QRS Duration: 102 QT Interval:  486 QTC  Calculation: 451 R Axis:   -28 Text Interpretation: Sinus bradycardia with 1st degree A-V block When compared with ECG of 30-Mar-2021 10:42, PREVIOUS ECG IS PRESENT Confirmed by Eligha Bridegroom [phone]) on 12/20/2022 10:03:05 AM    Diagnoses:    1. Essential hypertension   2. Medication management   3. Syncope and collapse   4. Stage 3b chronic kidney disease (HCC)   5. Bilateral leg edema   6. Mixed hyperlipidemia   7. Sinus bradycardia   8. First degree AV block      Assessment and Plan:     Bilateral leg swelling: History of chronic leg edema. Reports no concerns with recent edema. Wears compression socks. Is unsure if he is taking furosemide. Will check at home. Walks daily for exercise. Would recommend he continue once weekly furosemide 40 mg daily. Continue leg elevation and low-sodium diet.  Sinus bradycardia with 1st degree AV block: EKG today reveals sinus bradycardia at 52 bpm with first-degree AV block with PR 248 ms. He is asymptomatic. Is not on any AV nodal blocking agents.  Continue to monitor with routine EKG.   Hypertension: BP is well controlled. He does not monitor on a consistent basis. Per Dr. Excell Seltzer, primary cardiologist tolerating SBP 150 in setting of postural hypotension. No changes today.   Syncope and collapse: Remote history of syncope. Cardiac monitor 07/05/21 with no high grade AV block or significant arrhythmia contributing. No recent symptoms of lightheadedness, presyncope, syncope.   CKD: Most recent SCr 2.96 on 11/05/22. He sees Dr. Kathrene Bongo at [facility]. No recent concerns per his report.  Hyperlipidemia: LDL 80, trigs 109 on 07/07/2022. Continue rosuvastatin.   Disposition:  6 months  with Dr. Excell Seltzer or APP   Medication Adjustments/Labs and Tests Ordered: Current medicines are reviewed at length with the patient today.  Concerns regarding medicines are outlined above.  No orders of the defined types were placed in this  encounter.  No orders of the defined types were placed in this encounter.   Patient Instructions  Medication Instructions:   Your physician recommends that you continue on your current medications as directed. Please refer to the Current Medication list given to you today.   *If you need a refill on your cardiac medications before your next appointment, please call your pharmacy*   Lab Work:  None ordered.  If you have labs (blood work) drawn today and your tests are completely normal, you will receive your results only by: MyChart Message (if you have MyChart) OR A paper copy in the mail If you have any lab test that is abnormal or we need to change your treatment, we will call you to review the results.   Testing/Procedures:  None ordered.   Follow-Up: At [facility], you and your health needs are our priority.  As part of our continuing mission to provide you with exceptional heart care, we have created designated Provider Care Teams.  These Care Teams include your primary Cardiologist (physician) and Advanced Practice Providers (APPs -  Physician Assistants and Nurse Practitioners) who all work together to provide you with the care you need, when you need it.  We recommend signing up for the patient portal called "MyChart".  Sign up information is provided on this After Visit Summary.  MyChart is used to connect with patients for Virtual Visits (Telemedicine).  Patients are able to view lab/test results, encounter notes, upcoming appointments, etc.  Non-urgent messages can be sent to your provider as well.   To learn more about what you can do with MyChart, go to [email or web address].    Your next appointment:   6 month(s)  Provider:   Tonny Bollman, MD     Other Instructions  Please call or send mychart message to Let us know if you are taking Furosemide.     Signed, Levi Aland, NP  12/20/2022 10:07 AM    [facility]

## 2022-12-20 ENCOUNTER — Ambulatory Visit: Payer: PPO | Attending: Nurse Practitioner | Admitting: Nurse Practitioner

## 2022-12-20 ENCOUNTER — Other Ambulatory Visit: Payer: Self-pay

## 2022-12-20 ENCOUNTER — Telehealth: Payer: Self-pay | Admitting: Nurse Practitioner

## 2022-12-20 ENCOUNTER — Encounter: Payer: Self-pay | Admitting: Nurse Practitioner

## 2022-12-20 VITALS — BP 134/60 | HR 52 | Ht 70.0 in | Wt 185.4 lb

## 2022-12-20 DIAGNOSIS — R600 Localized edema: Secondary | ICD-10-CM

## 2022-12-20 DIAGNOSIS — N1832 Chronic kidney disease, stage 3b: Secondary | ICD-10-CM

## 2022-12-20 DIAGNOSIS — E782 Mixed hyperlipidemia: Secondary | ICD-10-CM

## 2022-12-20 DIAGNOSIS — Z79899 Other long term (current) drug therapy: Secondary | ICD-10-CM | POA: Diagnosis not present

## 2022-12-20 DIAGNOSIS — I10 Essential (primary) hypertension: Secondary | ICD-10-CM | POA: Diagnosis not present

## 2022-12-20 DIAGNOSIS — I440 Atrioventricular block, first degree: Secondary | ICD-10-CM | POA: Diagnosis not present

## 2022-12-20 DIAGNOSIS — R55 Syncope and collapse: Secondary | ICD-10-CM

## 2022-12-20 DIAGNOSIS — R001 Bradycardia, unspecified: Secondary | ICD-10-CM

## 2022-12-20 NOTE — Telephone Encounter (Signed)
Returned call to [place], she states patient is taking furosemide 40mg  daily. On medication list is also instructs to take 1 tablet as needed for fluid retention (take weekly on Sundays).  Randa Evens states patient is taking it once daily.  Will forward to Eligha Bridegroom, NP to review.

## 2022-12-20 NOTE — Telephone Encounter (Signed)
Pt c/o medication issue:  1. Name of Medication:   furosemide (LASIX) 40 MG tablet    2. How are you currently taking this medication (dosage and times per day)?  Take 1 tablet (40 mg total) by mouth daily. Take 1 tablet by mouth as needed for fluid retention (once weekly on Sundays).       3. Are you having a reaction (difficulty breathing--STAT)? No  4. What is your medication issue? Pt spouse called to let Office know pt is currently on this medication and how many times he takes it and when. She stated she was told to call in to let NP know. Please advise

## 2022-12-20 NOTE — Patient Instructions (Signed)
Medication Instructions:   Your physician recommends that you continue on your current medications as directed. Please refer to the Current Medication list given to you today.   *If you need a refill on your cardiac medications before your next appointment, please call your pharmacy*   Lab Work:  None ordered.  If you have labs (blood work) drawn today and your tests are completely normal, you will receive your results only by: MyChart Message (if you have MyChart) OR A paper copy in the mail If you have any lab test that is abnormal or we need to change your treatment, we will call you to review the results.   Testing/Procedures:  None ordered.   Follow-Up: At [facility], you and your health needs are our priority.  As part of our continuing mission to provide you with exceptional heart care, we have created designated Provider Care Teams.  These Care Teams include your primary Cardiologist (physician) and Advanced Practice Providers (APPs -  Physician Assistants and Nurse Practitioners) who all work together to provide you with the care you need, when you need it.  We recommend signing up for the patient portal called "MyChart".  Sign up information is provided on this After Visit Summary.  MyChart is used to connect with patients for Virtual Visits (Telemedicine).  Patients are able to view lab/test results, encounter notes, upcoming appointments, etc.  Non-urgent messages can be sent to your provider as well.   To learn more about what you can do with MyChart, go to [email or web address].    Your next appointment:   6 month(s)  Provider:   Tonny Bollman, MD     Other Instructions  Please call or send mychart message to Let us know if you are taking Furosemide.

## 2022-12-22 NOTE — Telephone Encounter (Signed)
He was doing well at his office visit and per his report his kidney function has been stable according to his nephrologist. Recommend that he continue Lasix 40 mg daily unless otherwise advised by nephrologist.

## 2023-01-05 ENCOUNTER — Ambulatory Visit: Payer: PPO | Admitting: Internal Medicine

## 2023-02-08 DIAGNOSIS — L84 Corns and callosities: Secondary | ICD-10-CM | POA: Diagnosis not present

## 2023-02-08 DIAGNOSIS — E1159 Type 2 diabetes mellitus with other circulatory complications: Secondary | ICD-10-CM | POA: Diagnosis not present

## 2023-02-08 DIAGNOSIS — B351 Tinea unguium: Secondary | ICD-10-CM | POA: Diagnosis not present

## 2023-02-23 DIAGNOSIS — H35373 Puckering of macula, bilateral: Secondary | ICD-10-CM | POA: Diagnosis not present

## 2023-02-23 DIAGNOSIS — H52203 Unspecified astigmatism, bilateral: Secondary | ICD-10-CM | POA: Diagnosis not present

## 2023-02-23 DIAGNOSIS — D3132 Benign neoplasm of left choroid: Secondary | ICD-10-CM | POA: Diagnosis not present

## 2023-02-23 DIAGNOSIS — Z961 Presence of intraocular lens: Secondary | ICD-10-CM | POA: Diagnosis not present

## 2023-03-01 DIAGNOSIS — N184 Chronic kidney disease, stage 4 (severe): Secondary | ICD-10-CM | POA: Diagnosis not present

## 2023-03-08 DIAGNOSIS — I129 Hypertensive chronic kidney disease with stage 1 through stage 4 chronic kidney disease, or unspecified chronic kidney disease: Secondary | ICD-10-CM | POA: Diagnosis not present

## 2023-03-08 DIAGNOSIS — N184 Chronic kidney disease, stage 4 (severe): Secondary | ICD-10-CM | POA: Diagnosis not present

## 2023-03-08 DIAGNOSIS — E1122 Type 2 diabetes mellitus with diabetic chronic kidney disease: Secondary | ICD-10-CM | POA: Diagnosis not present

## 2023-03-31 ENCOUNTER — Other Ambulatory Visit: Payer: Self-pay | Admitting: Internal Medicine

## 2023-04-12 ENCOUNTER — Other Ambulatory Visit (HOSPITAL_BASED_OUTPATIENT_CLINIC_OR_DEPARTMENT_OTHER): Payer: Self-pay

## 2023-04-12 MED ORDER — FLUAD 0.5 ML IM SUSY
0.5000 mL | PREFILLED_SYRINGE | Freq: Once | INTRAMUSCULAR | 0 refills | Status: AC
  Filled 2023-04-12: qty 0.5, 1d supply, fill #0

## 2023-05-14 ENCOUNTER — Other Ambulatory Visit: Payer: Self-pay | Admitting: Internal Medicine

## 2023-05-14 DIAGNOSIS — F320 Major depressive disorder, single episode, mild: Secondary | ICD-10-CM

## 2023-05-23 ENCOUNTER — Telehealth: Payer: Self-pay | Admitting: Internal Medicine

## 2023-05-23 DIAGNOSIS — R2689 Other abnormalities of gait and mobility: Secondary | ICD-10-CM

## 2023-05-23 NOTE — Telephone Encounter (Signed)
[facility] is requesting orders for Occupational therapy to be assessed for a rollator walker.   Please fax orders to: [phone]  Please call [facility] with any questions: [phone]

## 2023-05-24 DIAGNOSIS — E1159 Type 2 diabetes mellitus with other circulatory complications: Secondary | ICD-10-CM | POA: Diagnosis not present

## 2023-05-24 DIAGNOSIS — L84 Corns and callosities: Secondary | ICD-10-CM | POA: Diagnosis not present

## 2023-05-24 DIAGNOSIS — B351 Tinea unguium: Secondary | ICD-10-CM | POA: Diagnosis not present

## 2023-05-25 NOTE — Telephone Encounter (Signed)
Referral ordered

## 2023-05-29 ENCOUNTER — Other Ambulatory Visit: Payer: Self-pay | Admitting: Internal Medicine

## 2023-05-29 ENCOUNTER — Encounter: Payer: Self-pay | Admitting: Internal Medicine

## 2023-05-30 ENCOUNTER — Ambulatory Visit: Payer: PPO | Admitting: Cardiovascular Disease

## 2023-05-30 NOTE — Telephone Encounter (Signed)
Faxed today

## 2023-05-30 NOTE — Telephone Encounter (Signed)
[facility] is requesting we send the referral order again to an alternate fax number.  Please send to: [phone]

## 2023-06-03 DIAGNOSIS — R2681 Unsteadiness on feet: Secondary | ICD-10-CM | POA: Diagnosis not present

## 2023-06-03 DIAGNOSIS — M63852 Disorders of muscle in diseases classified elsewhere, left thigh: Secondary | ICD-10-CM | POA: Diagnosis not present

## 2023-06-03 DIAGNOSIS — R293 Abnormal posture: Secondary | ICD-10-CM | POA: Diagnosis not present

## 2023-06-03 DIAGNOSIS — M63861 Disorders of muscle in diseases classified elsewhere, right lower leg: Secondary | ICD-10-CM | POA: Diagnosis not present

## 2023-06-03 DIAGNOSIS — G3184 Mild cognitive impairment, so stated: Secondary | ICD-10-CM | POA: Diagnosis not present

## 2023-06-03 DIAGNOSIS — M63851 Disorders of muscle in diseases classified elsewhere, right thigh: Secondary | ICD-10-CM | POA: Diagnosis not present

## 2023-06-03 DIAGNOSIS — R42 Dizziness and giddiness: Secondary | ICD-10-CM | POA: Diagnosis not present

## 2023-06-03 DIAGNOSIS — Z9181 History of falling: Secondary | ICD-10-CM | POA: Diagnosis not present

## 2023-06-03 DIAGNOSIS — R4189 Other symptoms and signs involving cognitive functions and awareness: Secondary | ICD-10-CM | POA: Diagnosis not present

## 2023-06-03 DIAGNOSIS — R278 Other lack of coordination: Secondary | ICD-10-CM | POA: Diagnosis not present

## 2023-06-07 DIAGNOSIS — R278 Other lack of coordination: Secondary | ICD-10-CM | POA: Diagnosis not present

## 2023-06-07 DIAGNOSIS — G3184 Mild cognitive impairment, so stated: Secondary | ICD-10-CM | POA: Diagnosis not present

## 2023-06-07 DIAGNOSIS — Z9181 History of falling: Secondary | ICD-10-CM | POA: Diagnosis not present

## 2023-06-07 DIAGNOSIS — R42 Dizziness and giddiness: Secondary | ICD-10-CM | POA: Diagnosis not present

## 2023-06-07 DIAGNOSIS — R4189 Other symptoms and signs involving cognitive functions and awareness: Secondary | ICD-10-CM | POA: Diagnosis not present

## 2023-06-07 DIAGNOSIS — M63851 Disorders of muscle in diseases classified elsewhere, right thigh: Secondary | ICD-10-CM | POA: Diagnosis not present

## 2023-06-07 DIAGNOSIS — R293 Abnormal posture: Secondary | ICD-10-CM | POA: Diagnosis not present

## 2023-06-07 DIAGNOSIS — M63861 Disorders of muscle in diseases classified elsewhere, right lower leg: Secondary | ICD-10-CM | POA: Diagnosis not present

## 2023-06-07 DIAGNOSIS — M63852 Disorders of muscle in diseases classified elsewhere, left thigh: Secondary | ICD-10-CM | POA: Diagnosis not present

## 2023-06-07 DIAGNOSIS — R2681 Unsteadiness on feet: Secondary | ICD-10-CM | POA: Diagnosis not present

## 2023-06-09 ENCOUNTER — Encounter: Payer: Self-pay | Admitting: Internal Medicine

## 2023-06-09 DIAGNOSIS — R911 Solitary pulmonary nodule: Secondary | ICD-10-CM | POA: Insufficient documentation

## 2023-06-09 NOTE — Assessment & Plan Note (Addendum)
RLL 4 mm on Ct 02/2022 Ct due - ordered

## 2023-06-09 NOTE — Patient Instructions (Addendum)
      Blood work was ordered.       Medications changes include :   stop amlodipine - see if the lightheadedness improves.  Monitor BP   A Ct scan of your lungs was ordered and someone will call you to schedule an appointment.     Return in about 6 months (around 12/09/2023) for Physical Exam.

## 2023-06-09 NOTE — Progress Notes (Signed)
Subjective:    Patient ID: [name], male    DOB: 1927/07/02, 89 y.o.   MRN: [id number]     HPI [name] is here for follow up of his chronic medical problems.  Has been evaluated for walker by OT and will be getting 1 soon he does plan on using it.  Going to get grab bars, toilets raised.   Walks on treadmill 30 min daily.  Will start exercises at gym --OT advised that he needs to increase his leg strength-especially his right leg which is weaker than the left.  Medications and allergies reviewed with patient and updated if appropriate.  Current Outpatient Medications on File Prior to Visit  Medication Sig Dispense Refill   amLODipine (NORVASC) 2.5 MG tablet Take 2.5 mg by mouth daily.     Cholecalciferol (VITAMIN D) 2000 UNITS CAPS Take 2 capsules by mouth daily.     dapagliflozin propanediol (FARXIGA) 5 MG TABS tablet Take by mouth daily.     escitalopram (LEXAPRO) 5 MG tablet TAKE ONE TABLET BY MOUTH DAILY 30 tablet 5   ferrous sulfate 325 (65 FE) MG EC tablet Take 1 tablet (325 mg total) by mouth every other day. 15 tablet 3   furosemide (LASIX) 40 MG tablet Take 1 tablet (40 mg total) by mouth daily. Take 1 tablet by mouth as needed for fluid retention (once weekly on Sundays). 10 tablet 5   levothyroxine (SYNTHROID) 50 MCG tablet Take 1 tablet (50 mcg total) by mouth daily. 90 tablet 3   polycarbophil (FIBERCON) 625 MG tablet Take 625 mg by mouth daily.     polyethylene glycol (MIRALAX / GLYCOLAX) 17 g packet Take 17 g by mouth 2 (two) times daily. Take 1 to 2 capfuls daily at noon 14 each 0   Probiotic Product (ALIGN) 4 MG CAPS Take 1 capsule (4 mg total) by mouth daily.     rosuvastatin (CRESTOR) 40 MG tablet TAKE ONE TABLET BY MOUTH EVERY DAY 90 tablet 1   traZODone (DESYREL) 100 MG tablet TAKE ONE TABLET BY MOUTH AT BEDTIME 90 tablet 3   No current facility-administered medications on file prior to visit.     Review of Systems  Constitutional:  Negative for  fever.  HENT:  Negative for trouble swallowing.   Respiratory:  Positive for cough (when eating). Negative for shortness of breath and wheezing.   Cardiovascular:  Positive for leg swelling. Negative for chest pain and palpitations.  Neurological:  Negative for light-headedness (when he stands) and headaches.       Objective:   Vitals:   06/10/23 1021  BP: 130/70  Pulse: 68  Temp: 97.9 F (36.6 C)  SpO2: 96%   BP Readings from Last 3 Encounters:  06/10/23 130/70  12/20/22 134/60  12/09/22 128/74   Wt Readings from Last 3 Encounters:  06/10/23 184 lb (83.5 kg)  12/20/22 185 lb 6.4 oz (84.1 kg)  12/09/22 187 lb (84.8 kg)   Body mass index is 26.4 kg/m.    Physical Exam Constitutional:      General: He is not in acute distress.    Appearance: Normal appearance. He is not ill-appearing.  HENT:     Head: Normocephalic and atraumatic.  Eyes:     Conjunctiva/sclera: Conjunctivae normal.  Cardiovascular:     Rate and Rhythm: Normal rate and regular rhythm.     Heart sounds: Normal heart sounds.  Pulmonary:     Effort: Pulmonary effort is normal. No  respiratory distress.     Breath sounds: Normal breath sounds. No wheezing or rales.  Musculoskeletal:     Right lower leg: No edema.     Left lower leg: No edema.  Skin:    General: Skin is warm and dry.     Findings: No rash.  Neurological:     Mental Status: He is alert. Mental status is at baseline.  Psychiatric:        Mood and Affect: Mood normal.        Lab Results  Component Value Date   WBC 12.8 (H) 07/07/2022   HGB 13.7 07/07/2022   HCT 42.9 07/07/2022   PLT 349.0 07/07/2022   GLUCOSE 97 07/07/2022   CHOL 145 07/07/2022   TRIG 109.0 07/07/2022   HDL 42.80 07/07/2022   LDLCALC 80 07/07/2022   ALT 15 07/07/2022   AST 21 07/07/2022   NA 145 07/07/2022   K 4.8 07/07/2022   CL 107 07/07/2022   CREATININE 2.88 (H) 07/07/2022   BUN 53 (H) 07/07/2022   CO2 28 07/07/2022   TSH 4.56 07/07/2022   PSA  0.00 Repeated and verified X2. (L) 07/07/2022   HGBA1C 6.0 07/07/2022   MICROALBUR 5.2 (H) 03/28/2013     Assessment & Plan:    See Problem List for Assessment and Plan of chronic medical problems.

## 2023-06-10 ENCOUNTER — Telehealth (HOSPITAL_BASED_OUTPATIENT_CLINIC_OR_DEPARTMENT_OTHER): Payer: Self-pay | Admitting: Internal Medicine

## 2023-06-10 ENCOUNTER — Ambulatory Visit (INDEPENDENT_AMBULATORY_CARE_PROVIDER_SITE_OTHER): Payer: PPO | Admitting: Internal Medicine

## 2023-06-10 VITALS — BP 130/70 | HR 68 | Temp 97.9°F | Ht 70.0 in | Wt 184.0 lb

## 2023-06-10 DIAGNOSIS — R918 Other nonspecific abnormal finding of lung field: Secondary | ICD-10-CM

## 2023-06-10 DIAGNOSIS — R911 Solitary pulmonary nodule: Secondary | ICD-10-CM

## 2023-06-10 DIAGNOSIS — R600 Localized edema: Secondary | ICD-10-CM | POA: Diagnosis not present

## 2023-06-10 DIAGNOSIS — E1122 Type 2 diabetes mellitus with diabetic chronic kidney disease: Secondary | ICD-10-CM

## 2023-06-10 DIAGNOSIS — R296 Repeated falls: Secondary | ICD-10-CM

## 2023-06-10 DIAGNOSIS — N184 Chronic kidney disease, stage 4 (severe): Secondary | ICD-10-CM

## 2023-06-10 DIAGNOSIS — E039 Hypothyroidism, unspecified: Secondary | ICD-10-CM | POA: Diagnosis not present

## 2023-06-10 DIAGNOSIS — I10 Essential (primary) hypertension: Secondary | ICD-10-CM

## 2023-06-10 DIAGNOSIS — G4709 Other insomnia: Secondary | ICD-10-CM | POA: Diagnosis not present

## 2023-06-10 DIAGNOSIS — E785 Hyperlipidemia, unspecified: Secondary | ICD-10-CM

## 2023-06-10 DIAGNOSIS — F419 Anxiety disorder, unspecified: Secondary | ICD-10-CM

## 2023-06-10 LAB — VITAMIN D 25 HYDROXY (VIT D DEFICIENCY, FRACTURES): VITD: 62.76 ng/mL (ref 30.00–100.00)

## 2023-06-10 LAB — LIPID PANEL
Cholesterol: 120 mg/dL (ref 0–200)
HDL: 34.5 mg/dL — ABNORMAL LOW (ref >39.00)
LDL Cholesterol: 63 mg/dL (ref 0–99)
NonHDL: 85.48
Total CHOL/HDL Ratio: 3
Triglycerides: 114 mg/dL (ref 0.0–149.0)
VLDL: 22.8 mg/dL (ref 0.0–40.0)

## 2023-06-10 LAB — TSH: TSH: 4.27 u[IU]/mL (ref 0.35–5.50)

## 2023-06-10 LAB — COMPREHENSIVE METABOLIC PANEL
ALT: 12 U/L (ref 0–53)
AST: 18 U/L (ref 0–37)
Albumin: 3.9 g/dL (ref 3.5–5.2)
Alkaline Phosphatase: 61 U/L (ref 39–117)
BUN: 64 mg/dL — ABNORMAL HIGH (ref 6–23)
CO2: 28 meq/L (ref 19–32)
Calcium: 8.9 mg/dL (ref 8.4–10.5)
Chloride: 106 meq/L (ref 96–112)
Creatinine, Ser: 3.06 mg/dL — ABNORMAL HIGH (ref 0.40–1.50)
GFR: 16.93 mL/min — ABNORMAL LOW (ref >60.00)
Glucose, Bld: 85 mg/dL (ref 70–99)
Potassium: 4.4 meq/L (ref 3.5–5.1)
Sodium: 143 meq/L (ref 135–145)
Total Bilirubin: 0.5 mg/dL (ref 0.2–1.2)
Total Protein: 6.5 g/dL (ref 6.0–8.3)

## 2023-06-10 LAB — CBC WITH DIFFERENTIAL/PLATELET
Basophils Absolute: 0 10*3/uL (ref 0.0–0.1)
Basophils Relative: 0.3 % (ref 0.0–3.0)
Eosinophils Absolute: 0 10*3/uL (ref 0.0–0.7)
Eosinophils Relative: 0 % (ref 0.0–5.0)
HCT: 38.4 % — ABNORMAL LOW (ref 39.0–52.0)
Hemoglobin: 12.4 g/dL — ABNORMAL LOW (ref 13.0–17.0)
Lymphocytes Relative: 33.6 % (ref 12.0–46.0)
Lymphs Abs: 5.1 10*3/uL — ABNORMAL HIGH (ref 0.7–4.0)
MCHC: 32.3 g/dL (ref 30.0–36.0)
MCV: 98.5 fL (ref 78.0–100.0)
Monocytes Absolute: 1.5 10*3/uL — ABNORMAL HIGH (ref 0.1–1.0)
Monocytes Relative: 10 % (ref 3.0–12.0)
Neutro Abs: 8.6 10*3/uL — ABNORMAL HIGH (ref 1.4–7.7)
Neutrophils Relative %: 56.1 % (ref 43.0–77.0)
Platelets: 454 10*3/uL — ABNORMAL HIGH (ref 150.0–400.0)
RBC: 3.9 Mil/uL — ABNORMAL LOW (ref 4.22–5.81)
RDW: 15.9 % — ABNORMAL HIGH (ref 11.5–15.5)
WBC: 15.3 10*3/uL — ABNORMAL HIGH (ref 4.0–10.5)

## 2023-06-10 LAB — HEMOGLOBIN A1C: Hgb A1c MFr Bld: 6 % (ref 4.6–6.5)

## 2023-06-10 NOTE — Assessment & Plan Note (Signed)
Chronic Has established with Dr. [facility] CMP, cbc Encouraged increased water intake

## 2023-06-10 NOTE — Assessment & Plan Note (Addendum)
Chronic Poor balance and recurrent falls - no recent falls Plans to start using a walker Walks daily on treadmill Discussed fall prevention

## 2023-06-10 NOTE — Assessment & Plan Note (Signed)
Chronic Swelling controlled Elevate legs Has Lasix 40 mg that he can take once a week as needed, but has not taken this

## 2023-06-10 NOTE — Assessment & Plan Note (Signed)
Chronic Controlled, stable Continue lexapro 5 mg daily

## 2023-06-10 NOTE — Assessment & Plan Note (Signed)
Chronic   Lab Results  Component Value Date   HGBA1C 6.0 07/07/2022   Sugars well controlled Check A1c Continue lifestyle control Stressed regular exercise, diabetic diet

## 2023-06-10 NOTE — Assessment & Plan Note (Signed)
Chronic Controlled, Stable Continue trazodone 100 mg at bedtime 

## 2023-06-10 NOTE — Assessment & Plan Note (Signed)
Chronic  Clinically euthyroid Check tsh and will titrate med dose if needed Currently taking levothyroxine 50 mcg daily 

## 2023-06-10 NOTE — Assessment & Plan Note (Addendum)
Chronic Blood pressure is well-controlled, but he is experiencing some orthostatic lightheadedness Cmp, cbc Stop amlodipine 2.5 mg daily for now Monitor BP-he will go to the nurse at his facility and have it checked a couple times a week and sees cardiology in a couple of weeks Advised him to monitor if lightheadedness improves

## 2023-06-10 NOTE — Assessment & Plan Note (Signed)
Chronic Regular exercise and healthy diet encouraged Check lipid panel  Continue Crestor 40 mg daily

## 2023-06-13 ENCOUNTER — Encounter: Payer: Self-pay | Admitting: Internal Medicine

## 2023-06-13 ENCOUNTER — Other Ambulatory Visit (HOSPITAL_BASED_OUTPATIENT_CLINIC_OR_DEPARTMENT_OTHER): Payer: Self-pay

## 2023-06-14 ENCOUNTER — Other Ambulatory Visit (HOSPITAL_BASED_OUTPATIENT_CLINIC_OR_DEPARTMENT_OTHER): Payer: Self-pay

## 2023-06-14 MED ORDER — AREXVY 120 MCG/0.5ML IM SUSR
0.5000 mL | Freq: Once | INTRAMUSCULAR | 0 refills | Status: AC
  Filled 2023-06-14: qty 0.5, 1d supply, fill #0

## 2023-06-30 ENCOUNTER — Ambulatory Visit: Payer: PPO | Attending: Cardiovascular Disease | Admitting: Cardiovascular Disease

## 2023-06-30 ENCOUNTER — Encounter: Payer: Self-pay | Admitting: Cardiovascular Disease

## 2023-06-30 VITALS — BP 148/62 | HR 53 | Ht 70.0 in | Wt 185.0 lb

## 2023-06-30 DIAGNOSIS — I10 Essential (primary) hypertension: Secondary | ICD-10-CM

## 2023-06-30 DIAGNOSIS — N1832 Chronic kidney disease, stage 3b: Secondary | ICD-10-CM | POA: Diagnosis not present

## 2023-06-30 DIAGNOSIS — R55 Syncope and collapse: Secondary | ICD-10-CM

## 2023-06-30 NOTE — Progress Notes (Signed)
 Cardiology Office Note:    Date:  06/30/2023   ID:  [name], DOB 04/26/1928, MRN [id number]  PCP:  Geofm Glade PARAS, MD   [facility] Providers Cardiologist:  Ozell Fell, MD     Referring MD: Geofm Glade PARAS, MD   Chief Complaint  Patient presents with   Hypertension    History of Present Illness:    [name] is a 89 y.o. male with a hx of hypertension, remote syncope, and mixed hyperlipidemia, presenting for follow-up evaluation.   The patient is here with his wife today. He's doing remarkably well. Walking 30 minutes on the treadmill about 6 days/week. He brings in BP readings with range of 128-138/62-68 mmHg. He has mild dizziness for a few seconds when first standing. He is getting a walker and they are raising the toilet seat for him to make it easier for him to stand. Today, he denies symptoms of palpitations, chest pain, shortness of breath, orthopnea, PND, lower extremity edema, or syncope.  Current Medications: Current Meds  Medication Sig   Cholecalciferol (VITAMIN D ) 2000 UNITS CAPS Take 2 capsules by mouth daily.   escitalopram  (LEXAPRO ) 5 MG tablet TAKE ONE TABLET BY MOUTH DAILY   ferrous sulfate  325 (65 FE) MG EC tablet Take 1 tablet (325 mg total) by mouth every other day.   levothyroxine  (SYNTHROID ) 50 MCG tablet Take 1 tablet (50 mcg total) by mouth daily.   polycarbophil (FIBERCON) 625 MG tablet Take 625 mg by mouth daily.   polyethylene glycol (MIRALAX  / GLYCOLAX ) 17 g packet Take 17 g by mouth 2 (two) times daily. Take 1 to 2 capfuls daily at noon   Probiotic Product (ALIGN) 4 MG CAPS Take 1 capsule (4 mg total) by mouth daily.   rosuvastatin  (CRESTOR ) 40 MG tablet TAKE ONE TABLET BY MOUTH EVERY DAY   traZODone  (DESYREL ) 100 MG tablet TAKE ONE TABLET BY MOUTH AT BEDTIME     Allergies:   Sulfa antibiotics and Sulfonamide derivatives   ROS:   Please see the history of present illness.    All other systems reviewed and are  negative.  EKGs/Labs/Other Studies Reviewed:    The following studies were reviewed today: Cardiac Studies & Procedures     STRESS TESTS  MYOCARDIAL PERFUSION IMAGING 04/05/2005  ECHOCARDIOGRAM  ECHOCARDIOGRAM COMPLETE 07/07/2021  Narrative ECHOCARDIOGRAM REPORT    Patient Name:   [name] Date of Exam: 07/07/2021 Medical Rec #:  [id number]       Height:       71.0 in Accession #:    [id number]      Weight:       191.6 lb Date of Birth:  1927-07-30       BSA:          2.070 m Patient Age:    89 years        BP:           134/68 mmHg Patient Gender: M               HR:           62 bpm. Exam Location:  [address]  Procedure: 2D Echo, 3D Echo, Cardiac Doppler and Color Doppler  Indications:    R55 Syncope  History:        Patient has prior history of Echocardiogram examinations, most recent 05/01/2018. Arrythmias:Bradycardia, Signs/Symptoms:Syncope; Risk Factors:Hypertension, Diabetes, Dyslipidemia, Former Smoker and Sleep Apnea.  Sonographer:    Heather Hawks RDCS  Referring Phys: Consuela Widener  IMPRESSIONS   1. Left ventricular ejection fraction, by estimation, is 60 to 65%. The left ventricle has normal function. The left ventricle has no regional wall motion abnormalities. Left ventricular diastolic parameters are consistent with Grade I diastolic dysfunction (impaired relaxation). 2. Right ventricular systolic function is normal. The right ventricular size is normal. 3. Left atrial size was mildly dilated. 4. The mitral valve is normal in structure. Mild mitral valve regurgitation. No evidence of mitral stenosis. 5. The aortic valve is tricuspid. There is mild calcification of the aortic valve. There is mild thickening of the aortic valve. Aortic valve regurgitation is mild. Aortic valve sclerosis/calcification is present, without any evidence of aortic stenosis. 6. Pulmonic valve regurgitation is moderate. 7. The inferior vena cava is normal in size with  greater than 50% respiratory variability, suggesting right atrial pressure of 3 mmHg.  FINDINGS Left Ventricle: Left ventricular ejection fraction, by estimation, is 60 to 65%. The left ventricle has normal function. The left ventricle has no regional wall motion abnormalities. The left ventricular internal cavity size was normal in size. There is no left ventricular hypertrophy. Left ventricular diastolic parameters are consistent with Grade I diastolic dysfunction (impaired relaxation).  Right Ventricle: The right ventricular size is normal. No increase in right ventricular wall thickness. Right ventricular systolic function is normal.  Left Atrium: Left atrial size was mildly dilated.  Right Atrium: Right atrial size was normal in size.  Pericardium: There is no evidence of pericardial effusion.  Mitral Valve: The mitral valve is normal in structure. Mild mitral valve regurgitation. No evidence of mitral valve stenosis.  Tricuspid Valve: The tricuspid valve is normal in structure. Tricuspid valve regurgitation is not demonstrated. No evidence of tricuspid stenosis.  Aortic Valve: The aortic valve is tricuspid. There is mild calcification of the aortic valve. There is mild thickening of the aortic valve. Aortic valve regurgitation is mild. Aortic regurgitation PHT measures 538 msec. Aortic valve sclerosis/calcification is present, without any evidence of aortic stenosis.  Pulmonic Valve: The pulmonic valve was normal in structure. Pulmonic valve regurgitation is moderate. No evidence of pulmonic stenosis.  Aorta: The aortic root is normal in size and structure.  Venous: The inferior vena cava is normal in size with greater than 50% respiratory variability, suggesting right atrial pressure of 3 mmHg.  IAS/Shunts: No atrial level shunt detected by color flow Doppler.   LEFT VENTRICLE PLAX 2D LVIDd:         4.70 cm   Diastology LVIDs:         3.40 cm   LV e' medial:    5.55 cm/s LV  PW:         0.80 cm   LV E/e' medial:  8.9 LV IVS:        1.00 cm   LV e' lateral:   6.42 cm/s LVOT diam:     2.20 cm   LV E/e' lateral: 7.7 LV SV:         84 LV SV Index:   40 LVOT Area:     3.80 cm  3D Volume EF: 3D EF:        59 % LV EDV:       190 ml LV ESV:       79 ml LV SV:        111 ml  RIGHT VENTRICLE RV S prime:     19.20 cm/s TAPSE (M-mode): 2.5 cm  LEFT ATRIUM  Index        RIGHT ATRIUM           Index LA diam:        4.20 cm 2.03 cm/m   RA Area:     20.80 cm LA Vol (A2C):   52.2 ml 25.21 ml/m  RA Volume:   58.00 ml  28.01 ml/m LA Vol (A4C):   85.3 ml 41.20 ml/m LA Biplane Vol: 69.3 ml 33.47 ml/m AORTIC VALVE LVOT Vmax:   89.45 cm/s LVOT Vmean:  57.650 cm/s LVOT VTI:    0.220 m AI PHT:      538 msec  AORTA Ao Root diam: 3.90 cm Ao Asc diam:  3.60 cm  MITRAL VALVE MV Area (PHT): cm          SHUNTS MV Decel Time: 356 msec     Systemic VTI:  0.22 m MV E velocity: 49.25 cm/s   Systemic Diam: 2.20 cm MV A velocity: 110.00 cm/s MV E/A ratio:  0.45  Oneil Parchment MD Electronically signed by Oneil Parchment MD Signature Date/Time: 07/07/2021/3:13:46 PM    Final   MONITORS  LONG TERM MONITOR (3-14 DAYS) 07/02/2021  Narrative Patch Wear Time:  13 days and 16 hours (2022-12-15T17:49:08-498 to 2022-12-29T10:00:41-0500)  Patient had a min HR of 44 bpm, max HR of 146 bpm, and avg HR of 57 bpm. Predominant underlying rhythm was Sinus Rhythm. First Degree AV Block was present. Bundle Branch Block/IVCD was present. QRS morphology changes were present throughout recording. 52 Supraventricular Tachycardia runs occurred, the run with the fastest interval lasting 15 beats with a max rate of 146 bpm, the longest lasting 15.9 secs with an avg rate of 99 bpm. Some episodes of Supraventricular Tachycardia may be possible Atrial Tachycardia with variable block. Idioventricular Rhythm was present. Isolated SVEs were rare (<1.0%), SVE Couplets were rare (<1.0%), and  SVE Triplets were rare (<1.0%). Isolated VEs were rare (<1.0%, 1845), VE Couplets were rare (<1.0%, 42), and VE Triplets were rare (<1.0%, 1). Ventricular Bigeminy and Trigeminy were present.  1. The basic rhythm is normal sinus with an average HR of 57 bpm 2. No atrial fibrillation or flutter 3. No high-grade heart block or pathologic pauses 4. There are no PVC's and no supraventricular beats with several short supraventricular runs, the longest lasting 16 seconds at a rate of 99 bpm.           EKG:        Recent Labs: 06/10/2023: ALT 12; BUN 64; Creatinine, Ser 3.06; Hemoglobin 12.4; Platelets 454.0; Potassium 4.4; Sodium 143; TSH 4.27  Recent Lipid Panel    Component Value Date/Time   CHOL 120 06/10/2023 1118   TRIG 114.0 06/10/2023 1118   TRIG 48 04/05/2006 0750   HDL 34.50 (L) 06/10/2023 1118   CHOLHDL 3 06/10/2023 1118   VLDL 22.8 06/10/2023 1118   LDLCALC 63 06/10/2023 1118           Physical Exam:    VS:  BP (!) 148/62   Pulse (!) 53   Ht 5' 10 (1.778 m)   Wt 185 lb (83.9 kg)   SpO2 96%   BMI 26.54 kg/m     Wt Readings from Last 3 Encounters:  06/30/23 185 lb (83.9 kg)  06/10/23 184 lb (83.5 kg)  12/20/22 185 lb 6.4 oz (84.1 kg)     GEN:  Well nourished, well developed elderly male in no acute distress HEENT: Normal NECK: No JVD; No carotid bruits LYMPHATICS: No  lymphadenopathy CARDIAC: RRR, no murmurs, rubs, gallops RESPIRATORY:  Clear to auscultation without rales, wheezing or rhonchi  ABDOMEN: Soft, non-tender, non-distended MUSCULOSKELETAL:  No edema; No deformity  SKIN: Warm and dry NEUROLOGIC:  Alert and oriented x 3 PSYCHIATRIC:  Normal affect   Assessment & Plan Essential hypertension BP well-controlled on home readings. Continue current Rx. High risk of falling at his very advanced age with aggressive BP lowering. Home readings < 140 mmHg.  Stage 3b chronic kidney disease (HCC) Followed by Dr Prescilla at [facility]. Treated  with farxiga.  Syncope and collapse Remote, no further issues.        Medication Adjustments/Labs and Tests Ordered: Current medicines are reviewed at length with the patient today.  Concerns regarding medicines are outlined above.  No orders of the defined types were placed in this encounter.  No orders of the defined types were placed in this encounter.   Patient Instructions  Follow-Up: At [facility], you and your health needs are our priority.  As part of our continuing mission to provide you with exceptional heart care, we have created designated Provider Care Teams.  These Care Teams include your primary Cardiologist (physician) and Advanced Practice Providers (APPs -  Physician Assistants and Nurse Practitioners) who all work together to provide you with the care you need, when you need it.  Your next appointment:   1 year(s)  Provider:   Ozell Fell, MD        Signed, Ozell Fell, MD  06/30/2023 12:13 PM     [facility]

## 2023-06-30 NOTE — Assessment & Plan Note (Signed)
 Remote, no further issues.

## 2023-06-30 NOTE — Patient Instructions (Signed)
 Follow-Up: At [facility], you and your health needs are our priority.  As part of our continuing mission to provide you with exceptional heart care, we have created designated Provider Care Teams.  These Care Teams include your primary Cardiologist (physician) and Advanced Practice Providers (APPs -  Physician Assistants and Nurse Practitioners) who all work together to provide you with the care you need, when you need it.  Your next appointment:   1 year(s)  Provider:   Tonny Bollman, MD

## 2023-07-01 DIAGNOSIS — R4189 Other symptoms and signs involving cognitive functions and awareness: Secondary | ICD-10-CM | POA: Diagnosis not present

## 2023-07-01 DIAGNOSIS — M63852 Disorders of muscle in diseases classified elsewhere, left thigh: Secondary | ICD-10-CM | POA: Diagnosis not present

## 2023-07-01 DIAGNOSIS — G3184 Mild cognitive impairment, so stated: Secondary | ICD-10-CM | POA: Diagnosis not present

## 2023-07-01 DIAGNOSIS — R42 Dizziness and giddiness: Secondary | ICD-10-CM | POA: Diagnosis not present

## 2023-07-01 DIAGNOSIS — M63861 Disorders of muscle in diseases classified elsewhere, right lower leg: Secondary | ICD-10-CM | POA: Diagnosis not present

## 2023-07-01 DIAGNOSIS — R2681 Unsteadiness on feet: Secondary | ICD-10-CM | POA: Diagnosis not present

## 2023-07-01 DIAGNOSIS — Z9181 History of falling: Secondary | ICD-10-CM | POA: Diagnosis not present

## 2023-07-01 DIAGNOSIS — R278 Other lack of coordination: Secondary | ICD-10-CM | POA: Diagnosis not present

## 2023-07-01 DIAGNOSIS — M63851 Disorders of muscle in diseases classified elsewhere, right thigh: Secondary | ICD-10-CM | POA: Diagnosis not present

## 2023-07-01 DIAGNOSIS — R293 Abnormal posture: Secondary | ICD-10-CM | POA: Diagnosis not present

## 2023-07-13 DIAGNOSIS — M63861 Disorders of muscle in diseases classified elsewhere, right lower leg: Secondary | ICD-10-CM | POA: Diagnosis not present

## 2023-07-13 DIAGNOSIS — R278 Other lack of coordination: Secondary | ICD-10-CM | POA: Diagnosis not present

## 2023-07-13 DIAGNOSIS — R42 Dizziness and giddiness: Secondary | ICD-10-CM | POA: Diagnosis not present

## 2023-07-13 DIAGNOSIS — R2681 Unsteadiness on feet: Secondary | ICD-10-CM | POA: Diagnosis not present

## 2023-07-13 DIAGNOSIS — M63851 Disorders of muscle in diseases classified elsewhere, right thigh: Secondary | ICD-10-CM | POA: Diagnosis not present

## 2023-07-13 DIAGNOSIS — Z9181 History of falling: Secondary | ICD-10-CM | POA: Diagnosis not present

## 2023-07-13 DIAGNOSIS — G3184 Mild cognitive impairment, so stated: Secondary | ICD-10-CM | POA: Diagnosis not present

## 2023-07-13 DIAGNOSIS — M63852 Disorders of muscle in diseases classified elsewhere, left thigh: Secondary | ICD-10-CM | POA: Diagnosis not present

## 2023-07-13 DIAGNOSIS — R4189 Other symptoms and signs involving cognitive functions and awareness: Secondary | ICD-10-CM | POA: Diagnosis not present

## 2023-07-13 DIAGNOSIS — R293 Abnormal posture: Secondary | ICD-10-CM | POA: Diagnosis not present

## 2023-07-19 ENCOUNTER — Ambulatory Visit (HOSPITAL_BASED_OUTPATIENT_CLINIC_OR_DEPARTMENT_OTHER)
Admission: RE | Admit: 2023-07-19 | Discharge: 2023-07-19 | Disposition: A | Discharge status: Home / Self Care | Payer: PPO | Source: Ambulatory Visit | Attending: Internal Medicine | Admitting: Internal Medicine

## 2023-07-19 DIAGNOSIS — R918 Other nonspecific abnormal finding of lung field: Secondary | ICD-10-CM | POA: Insufficient documentation

## 2023-07-19 DIAGNOSIS — R911 Solitary pulmonary nodule: Secondary | ICD-10-CM | POA: Insufficient documentation

## 2023-07-20 DIAGNOSIS — R4189 Other symptoms and signs involving cognitive functions and awareness: Secondary | ICD-10-CM | POA: Diagnosis not present

## 2023-07-20 DIAGNOSIS — G3184 Mild cognitive impairment, so stated: Secondary | ICD-10-CM | POA: Diagnosis not present

## 2023-07-20 DIAGNOSIS — R2681 Unsteadiness on feet: Secondary | ICD-10-CM | POA: Diagnosis not present

## 2023-07-20 DIAGNOSIS — R42 Dizziness and giddiness: Secondary | ICD-10-CM | POA: Diagnosis not present

## 2023-07-20 DIAGNOSIS — M63851 Disorders of muscle in diseases classified elsewhere, right thigh: Secondary | ICD-10-CM | POA: Diagnosis not present

## 2023-07-20 DIAGNOSIS — Z9181 History of falling: Secondary | ICD-10-CM | POA: Diagnosis not present

## 2023-07-20 DIAGNOSIS — R278 Other lack of coordination: Secondary | ICD-10-CM | POA: Diagnosis not present

## 2023-07-20 DIAGNOSIS — M63861 Disorders of muscle in diseases classified elsewhere, right lower leg: Secondary | ICD-10-CM | POA: Diagnosis not present

## 2023-07-20 DIAGNOSIS — M63852 Disorders of muscle in diseases classified elsewhere, left thigh: Secondary | ICD-10-CM | POA: Diagnosis not present

## 2023-07-20 DIAGNOSIS — R293 Abnormal posture: Secondary | ICD-10-CM | POA: Diagnosis not present

## 2023-07-27 ENCOUNTER — Encounter: Payer: Self-pay | Admitting: Internal Medicine

## 2023-08-02 DIAGNOSIS — B351 Tinea unguium: Secondary | ICD-10-CM | POA: Diagnosis not present

## 2023-08-02 DIAGNOSIS — E1159 Type 2 diabetes mellitus with other circulatory complications: Secondary | ICD-10-CM | POA: Diagnosis not present

## 2023-08-02 DIAGNOSIS — L84 Corns and callosities: Secondary | ICD-10-CM | POA: Diagnosis not present

## 2023-08-05 DIAGNOSIS — R42 Dizziness and giddiness: Secondary | ICD-10-CM | POA: Diagnosis not present

## 2023-08-05 DIAGNOSIS — R2681 Unsteadiness on feet: Secondary | ICD-10-CM | POA: Diagnosis not present

## 2023-08-05 DIAGNOSIS — R4189 Other symptoms and signs involving cognitive functions and awareness: Secondary | ICD-10-CM | POA: Diagnosis not present

## 2023-08-05 DIAGNOSIS — Z9181 History of falling: Secondary | ICD-10-CM | POA: Diagnosis not present

## 2023-08-05 DIAGNOSIS — R278 Other lack of coordination: Secondary | ICD-10-CM | POA: Diagnosis not present

## 2023-08-05 DIAGNOSIS — R293 Abnormal posture: Secondary | ICD-10-CM | POA: Diagnosis not present

## 2023-08-05 DIAGNOSIS — M63852 Disorders of muscle in diseases classified elsewhere, left thigh: Secondary | ICD-10-CM | POA: Diagnosis not present

## 2023-08-05 DIAGNOSIS — M63851 Disorders of muscle in diseases classified elsewhere, right thigh: Secondary | ICD-10-CM | POA: Diagnosis not present

## 2023-08-05 DIAGNOSIS — G3184 Mild cognitive impairment, so stated: Secondary | ICD-10-CM | POA: Diagnosis not present

## 2023-08-05 DIAGNOSIS — M63861 Disorders of muscle in diseases classified elsewhere, right lower leg: Secondary | ICD-10-CM | POA: Diagnosis not present

## 2023-08-18 DIAGNOSIS — R42 Dizziness and giddiness: Secondary | ICD-10-CM | POA: Diagnosis not present

## 2023-08-18 DIAGNOSIS — M63852 Disorders of muscle in diseases classified elsewhere, left thigh: Secondary | ICD-10-CM | POA: Diagnosis not present

## 2023-08-18 DIAGNOSIS — R4189 Other symptoms and signs involving cognitive functions and awareness: Secondary | ICD-10-CM | POA: Diagnosis not present

## 2023-08-18 DIAGNOSIS — R2681 Unsteadiness on feet: Secondary | ICD-10-CM | POA: Diagnosis not present

## 2023-08-18 DIAGNOSIS — M63851 Disorders of muscle in diseases classified elsewhere, right thigh: Secondary | ICD-10-CM | POA: Diagnosis not present

## 2023-08-18 DIAGNOSIS — R278 Other lack of coordination: Secondary | ICD-10-CM | POA: Diagnosis not present

## 2023-08-18 DIAGNOSIS — R293 Abnormal posture: Secondary | ICD-10-CM | POA: Diagnosis not present

## 2023-08-18 DIAGNOSIS — G3184 Mild cognitive impairment, so stated: Secondary | ICD-10-CM | POA: Diagnosis not present

## 2023-08-18 DIAGNOSIS — Z9181 History of falling: Secondary | ICD-10-CM | POA: Diagnosis not present

## 2023-08-18 DIAGNOSIS — M63861 Disorders of muscle in diseases classified elsewhere, right lower leg: Secondary | ICD-10-CM | POA: Diagnosis not present

## 2023-08-31 DIAGNOSIS — N184 Chronic kidney disease, stage 4 (severe): Secondary | ICD-10-CM | POA: Diagnosis not present

## 2023-09-05 DIAGNOSIS — N184 Chronic kidney disease, stage 4 (severe): Secondary | ICD-10-CM | POA: Diagnosis not present

## 2023-09-05 DIAGNOSIS — E1122 Type 2 diabetes mellitus with diabetic chronic kidney disease: Secondary | ICD-10-CM | POA: Diagnosis not present

## 2023-09-05 DIAGNOSIS — I129 Hypertensive chronic kidney disease with stage 1 through stage 4 chronic kidney disease, or unspecified chronic kidney disease: Secondary | ICD-10-CM | POA: Diagnosis not present

## 2023-09-12 DIAGNOSIS — H52203 Unspecified astigmatism, bilateral: Secondary | ICD-10-CM | POA: Diagnosis not present

## 2023-09-12 DIAGNOSIS — H35352 Cystoid macular degeneration, left eye: Secondary | ICD-10-CM | POA: Diagnosis not present

## 2023-09-12 DIAGNOSIS — H35373 Puckering of macula, bilateral: Secondary | ICD-10-CM | POA: Diagnosis not present

## 2023-09-12 DIAGNOSIS — Z961 Presence of intraocular lens: Secondary | ICD-10-CM | POA: Diagnosis not present

## 2023-09-12 DIAGNOSIS — D3132 Benign neoplasm of left choroid: Secondary | ICD-10-CM | POA: Diagnosis not present

## 2023-09-12 DIAGNOSIS — H353132 Nonexudative age-related macular degeneration, bilateral, intermediate dry stage: Secondary | ICD-10-CM | POA: Diagnosis not present

## 2023-09-23 DIAGNOSIS — H43813 Vitreous degeneration, bilateral: Secondary | ICD-10-CM | POA: Diagnosis not present

## 2023-09-23 DIAGNOSIS — H35431 Paving stone degeneration of retina, right eye: Secondary | ICD-10-CM | POA: Diagnosis not present

## 2023-09-23 DIAGNOSIS — H34832 Tributary (branch) retinal vein occlusion, left eye, with macular edema: Secondary | ICD-10-CM | POA: Diagnosis not present

## 2023-09-23 DIAGNOSIS — D3132 Benign neoplasm of left choroid: Secondary | ICD-10-CM | POA: Diagnosis not present

## 2023-09-23 DIAGNOSIS — H353132 Nonexudative age-related macular degeneration, bilateral, intermediate dry stage: Secondary | ICD-10-CM | POA: Diagnosis not present

## 2023-09-26 ENCOUNTER — Other Ambulatory Visit: Payer: Self-pay | Admitting: Internal Medicine

## 2023-09-26 DIAGNOSIS — T17308D Unspecified foreign body in larynx causing other injury, subsequent encounter: Secondary | ICD-10-CM

## 2023-09-26 NOTE — Progress Notes (Signed)
 Referred to ENT for choking, coughing when eating

## 2023-09-27 ENCOUNTER — Other Ambulatory Visit: Payer: Self-pay | Admitting: Internal Medicine

## 2023-09-27 ENCOUNTER — Other Ambulatory Visit (HOSPITAL_COMMUNITY): Payer: Self-pay | Admitting: *Deleted

## 2023-09-27 DIAGNOSIS — R059 Cough, unspecified: Secondary | ICD-10-CM

## 2023-09-27 DIAGNOSIS — T17308D Unspecified foreign body in larynx causing other injury, subsequent encounter: Secondary | ICD-10-CM

## 2023-09-27 DIAGNOSIS — R1310 Dysphagia, unspecified: Secondary | ICD-10-CM

## 2023-09-30 ENCOUNTER — Encounter (INDEPENDENT_AMBULATORY_CARE_PROVIDER_SITE_OTHER): Payer: Self-pay | Admitting: Otolaryngology

## 2023-10-04 ENCOUNTER — Encounter: Payer: Self-pay | Admitting: Internal Medicine

## 2023-10-04 ENCOUNTER — Ambulatory Visit (INDEPENDENT_AMBULATORY_CARE_PROVIDER_SITE_OTHER): Admitting: Internal Medicine

## 2023-10-04 VITALS — BP 142/78 | HR 74 | Temp 97.9°F | Ht 70.0 in | Wt 181.0 lb

## 2023-10-04 DIAGNOSIS — J069 Acute upper respiratory infection, unspecified: Secondary | ICD-10-CM | POA: Insufficient documentation

## 2023-10-04 MED ORDER — HYDROCODONE BIT-HOMATROP MBR 5-1.5 MG/5ML PO SOLN: 5.0000 mL | Freq: Three times a day (TID) | ORAL | 0 refills | Status: DC | PRN

## 2023-10-04 MED ORDER — DOXYCYCLINE HYCLATE 100 MG PO TABS: 100.0000 mg | ORAL_TABLET | Freq: Two times a day (BID) | ORAL | 0 refills | Status: AC

## 2023-10-04 NOTE — Progress Notes (Signed)
 Subjective:    Patient ID: [name], male    DOB: May 08, 1928, 89 y.o.   MRN: [id number]      HPI [name] is here for  Chief Complaint  Patient presents with   Cough    Cough - started over two weeks ago -- initally it was dry and now it is productive.  It seems to be getting worse.  He also states some congestion, PND.  Has not had any shortness of breath, wheezing, sinus pain, sore throat or fevers.  He has been taking over-the-counter cough syrup without much relief.  Last night he was up coughing good portion of the night.       Medications and allergies reviewed with patient and updated if appropriate.  Current Outpatient Medications on File Prior to Visit  Medication Sig Dispense Refill   Cholecalciferol (VITAMIN D) 2000 UNITS CAPS Take 2 capsules by mouth daily.     escitalopram (LEXAPRO) 5 MG tablet TAKE ONE TABLET BY MOUTH DAILY 30 tablet 5   ferrous sulfate 325 (65 FE) MG EC tablet Take 1 tablet (325 mg total) by mouth every other day. 15 tablet 3   furosemide (LASIX) 40 MG tablet Take 1 tablet (40 mg total) by mouth daily. Take 1 tablet by mouth as needed for fluid retention (once weekly on Sundays). 10 tablet 5   levothyroxine (SYNTHROID) 50 MCG tablet Take 1 tablet (50 mcg total) by mouth daily. 90 tablet 3   polycarbophil (FIBERCON) 625 MG tablet Take 625 mg by mouth daily.     polyethylene glycol (MIRALAX / GLYCOLAX) 17 g packet Take 17 g by mouth 2 (two) times daily. Take 1 to 2 capfuls daily at noon 14 each 0   Probiotic Product (ALIGN) 4 MG CAPS Take 1 capsule (4 mg total) by mouth daily.     rosuvastatin (CRESTOR) 40 MG tablet TAKE ONE TABLET BY MOUTH EVERY DAY 90 tablet 1   traZODone (DESYREL) 100 MG tablet TAKE ONE TABLET BY MOUTH AT BEDTIME 90 tablet 3   No current facility-administered medications on file prior to visit.    Review of Systems  Constitutional:  Negative for appetite change and fever.  HENT:  Positive for congestion and postnasal  drip. Negative for ear pain, sinus pain and sore throat.   Respiratory:  Positive for cough (productive). Negative for shortness of breath and wheezing.   Gastrointestinal:  Positive for constipation.  Neurological:  Positive for dizziness (occ when he gets up sitting). Negative for light-headedness and headaches.       Objective:   Vitals:   10/04/23 1526  BP: (!) 142/78  Pulse: 74  Temp: 97.9 F (36.6 C)  SpO2: 97%   BP Readings from Last 3 Encounters:  10/04/23 (!) 142/78  06/30/23 (!) 148/62  06/10/23 130/70   Wt Readings from Last 3 Encounters:  10/04/23 181 lb (82.1 kg)  06/30/23 185 lb (83.9 kg)  06/10/23 184 lb (83.5 kg)   Body mass index is 25.97 kg/m.    Physical Exam Constitutional:      General: He is not in acute distress.    Appearance: Normal appearance. He is not ill-appearing.  HENT:     Head: Normocephalic.     Right Ear: Tympanic membrane, ear canal and external ear normal. There is no impacted cerumen.     Left Ear: Tympanic membrane, ear canal and external ear normal. There is no impacted cerumen.     Mouth/Throat:  Mouth: Mucous membranes are moist.     Pharynx: No oropharyngeal exudate or posterior oropharyngeal erythema.  Eyes:     Conjunctiva/sclera: Conjunctivae normal.  Cardiovascular:     Rate and Rhythm: Normal rate and regular rhythm.  Pulmonary:     Effort: Pulmonary effort is normal. No respiratory distress.     Breath sounds: Normal breath sounds. No wheezing or rales.  Musculoskeletal:     Cervical back: Neck supple. No tenderness.  Lymphadenopathy:     Cervical: No cervical adenopathy.  Skin:    General: Skin is warm and dry.     Findings: No rash.  Neurological:     Mental Status: He is alert.            Assessment & Plan:    See Problem List for Assessment and Plan of chronic medical problems.

## 2023-10-04 NOTE — Patient Instructions (Addendum)
        Medications changes include :   doxycycline twice daily x 7 days and cough syrup      Return if symptoms worsen or fail to improve.

## 2023-10-04 NOTE — Assessment & Plan Note (Signed)
 Acute Symptoms consistent with URI-?  Viral versus bacterial Given age and concern for worsening symptoms we will start doxycycline 100 mg twice daily x 7 days Hycodan cough syrup Hemoglobin know if symptoms are not improving or worsening

## 2023-10-05 ENCOUNTER — Other Ambulatory Visit: Payer: Self-pay | Admitting: Internal Medicine

## 2023-10-10 DIAGNOSIS — H34832 Tributary (branch) retinal vein occlusion, left eye, with macular edema: Secondary | ICD-10-CM | POA: Diagnosis not present

## 2023-10-11 ENCOUNTER — Ambulatory Visit (HOSPITAL_COMMUNITY)
Admission: RE | Admit: 2023-10-11 | Discharge: 2023-10-11 | Disposition: A | Discharge status: Home / Self Care | Source: Ambulatory Visit | Attending: Internal Medicine

## 2023-10-11 ENCOUNTER — Ambulatory Visit (HOSPITAL_COMMUNITY)
Admission: RE | Admit: 2023-10-11 | Discharge: 2023-10-11 | Disposition: A | Discharge status: Home / Self Care | Source: Ambulatory Visit | Attending: Internal Medicine | Admitting: Internal Medicine

## 2023-10-11 DIAGNOSIS — R1313 Dysphagia, pharyngeal phase: Secondary | ICD-10-CM | POA: Insufficient documentation

## 2023-10-11 DIAGNOSIS — R1310 Dysphagia, unspecified: Secondary | ICD-10-CM

## 2023-10-11 DIAGNOSIS — R059 Cough, unspecified: Secondary | ICD-10-CM

## 2023-10-11 DIAGNOSIS — T17308D Unspecified foreign body in larynx causing other injury, subsequent encounter: Secondary | ICD-10-CM

## 2023-10-11 NOTE — Evaluation (Signed)
 Modified Barium Swallow Study  Patient Details  Name: [name] MRN: [id number] Date of Birth: June 13, 1928  Today's Date: 10/11/2023  Modified Barium Swallow completed.  Full report located under Chart Review in the Imaging Section.  History of Present Illness 89 yo presenting for OP MBS. Per MD note in March 2025, pt also referred to ENT for choking, coughing when eating. Pt describes an incident recently during which he felt choked on a piece of fruit that he didn't know was in his jello. He says he was able to cough during this time, but it felt stuck. Wife endorses intermittent coughing during other PO intake. PMH includes: MCI, GERD, peptic stricture of esophagus, thyroid nodule, OSA, foreign body aspiration (2009)   Clinical Impression Pt has a mild pharyngeal dysphagia with possible esophageal component as well. He has reduced movement of his hyolaryngeal mechanism, base of tongue retraction, and pharyngeal squeeze. This contributes to reduced epiglottic inversion, UES opening, and airway closure. He has small amounts of thin liquids that are silently aspirated (PAS 8, although did clear consistently with a cued throat clear), most significantly noted when drinking from a straw and when trying to use a liquid wash to clear the barium tablet from his pharynx. Residue was present across the study in his valleculae, as well as some in his pyriform sinuses, posterior pharyngeal wall, and tongue base. The barium tablet remained in the vallecuale despite additional boluses (even puree, with aspiration occurring with liquid wash), but cleared promptly when cued to use a chin tuck. The barium tablet also appeared to be retained in the esophagus along with additional barium, slowly moving downward toward his distal esophagus with additional swallows. Recommend that pt continue current diet but with additional precautions in place, including small bites/sips, upright posture (during and after meals),  throat clear while drinking liquids, chin tuck while taking pills, and alternate bites/sips. Education and written strategies were provided to pt and wife, although they would benefit from reinforcement and additional SLP follow up.  Factors that may increase risk of adverse event in presence of aspiration Roderick Civatte & Jessy Morocco 2021): Respiratory or GI disease  Swallow Evaluation Recommendations Recommendations: PO diet PO Diet Recommendation: Regular;Thin liquids (Level 0) Liquid Administration via: Cup;No straw Medication Administration: Other (Comment) (liquid vs puree but with chin tuck) Supervision: Patient able to self-feed Swallowing strategies  : Minimize environmental distractions;Slow rate;Small bites/sips;Follow solids with liquids;Clear throat intermittently Postural changes: Position pt fully upright for meals;Stay upright 30-60 min after meals Oral care recommendations: Oral care BID (2x/day)      Beth Brooke., M.A. CCC-SLP Acute Rehabilitation Services Office [phone]  Secure chat preferred  10/11/2023,2:57 PM

## 2023-10-12 ENCOUNTER — Telehealth: Payer: Self-pay | Admitting: Internal Medicine

## 2023-10-12 ENCOUNTER — Encounter: Payer: Self-pay | Admitting: Internal Medicine

## 2023-10-12 DIAGNOSIS — R1310 Dysphagia, unspecified: Secondary | ICD-10-CM | POA: Insufficient documentation

## 2023-10-12 NOTE — Telephone Encounter (Signed)
 Call wife -- he had the swallow eval and speech therapy was recommended -- do they want me to place an order for ST at [facility]?

## 2023-10-12 NOTE — Telephone Encounter (Signed)
 Spoke with spouse today.   They have opted to hold off on ST for now. She states they were given some exercises and swallowing techniques.  They will call and let us  know.

## 2023-10-12 NOTE — Telephone Encounter (Signed)
 noted

## 2023-10-26 ENCOUNTER — Encounter: Payer: Self-pay | Admitting: Internal Medicine

## 2023-11-22 ENCOUNTER — Institutional Professional Consult (permissible substitution) (INDEPENDENT_AMBULATORY_CARE_PROVIDER_SITE_OTHER): Admitting: Otolaryngology

## 2023-11-22 DIAGNOSIS — H34832 Tributary (branch) retinal vein occlusion, left eye, with macular edema: Secondary | ICD-10-CM | POA: Diagnosis not present

## 2023-11-22 DIAGNOSIS — H35431 Paving stone degeneration of retina, right eye: Secondary | ICD-10-CM | POA: Diagnosis not present

## 2023-11-22 DIAGNOSIS — D3132 Benign neoplasm of left choroid: Secondary | ICD-10-CM | POA: Diagnosis not present

## 2023-11-22 DIAGNOSIS — H353132 Nonexudative age-related macular degeneration, bilateral, intermediate dry stage: Secondary | ICD-10-CM | POA: Diagnosis not present

## 2023-11-22 DIAGNOSIS — H43813 Vitreous degeneration, bilateral: Secondary | ICD-10-CM | POA: Diagnosis not present

## 2023-11-28 ENCOUNTER — Ambulatory Visit (INDEPENDENT_AMBULATORY_CARE_PROVIDER_SITE_OTHER): Payer: PPO

## 2023-11-28 VITALS — Ht 71.0 in | Wt 175.0 lb

## 2023-11-28 DIAGNOSIS — Z0000 Encounter for general adult medical examination without abnormal findings: Secondary | ICD-10-CM

## 2023-11-28 NOTE — Patient Instructions (Signed)
 [name] , Thank you for taking time out of your busy schedule to complete your Annual Wellness Visit with me. I enjoyed our conversation and look forward to speaking with you again next year. I, as well as your care team,  appreciate your ongoing commitment to your health goals. Please review the following plan we discussed and let me know if I can assist you in the future. Your Game plan/ To Do List   Follow up Visits: Next Medicare AWV with our clinical staff: 11/29/2024   Have you seen your provider in the last 6 months (3 months if uncontrolled diabetes)? Yes Next Office Visit with your provider: 12/27/2023  Clinician Recommendations:  Aim for 30 minutes of exercise or brisk walking, 6-8 glasses of water, and 5 servings of fruits and vegetables each day.       This is a list of the screening recommended for you and due dates:  Health Maintenance  Topic Date Due   Complete foot exam   05/25/2019   COVID-19 Vaccine (4 - 2024-25 season) 02/27/2023   Hemoglobin A1C  12/09/2023   Flu Shot  01/27/2024   Eye exam for diabetics  09/28/2024   Medicare Annual Wellness Visit  11/27/2024   DTaP/Tdap/Td vaccine (6 - Td or Tdap) 12/03/2026   Pneumonia Vaccine  Completed   Zoster (Shingles) Vaccine  Completed   HPV Vaccine  Aged Out   Meningitis B Vaccine  Aged Out    Advanced directives: (In Chart) A copy of your advanced directives are scanned into your chart should your provider ever need it. Advance Care Planning is important because it:  [x]  Makes sure you receive the medical care that is consistent with your values, goals, and preferences  [x]  It provides guidance to your family and loved ones and reduces their decisional burden about whether or not they are making the right decisions based on your wishes.  Follow the link provided in your after visit summary or read over the paperwork we have mailed to you to help you started getting your Advance Directives in place. If you need  assistance in completing these, please reach out to us  so that we can help you!

## 2023-11-28 NOTE — Progress Notes (Addendum)
 Subjective:   [name] is a 89 y.o. who presents for a Medicare Wellness preventive visit.  As a reminder, Annual Wellness Visits don't include a physical exam, and some assessments may be limited, especially if this visit is performed virtually. We may recommend an in-person follow-up visit with your provider if needed.  Visit Complete: Virtual I connected with  [name] on 11/28/23 by a audio enabled telemedicine application and verified that I am speaking with the correct person using two identifiers.  Patient Location: Home  Provider Location: Office/Clinic  I discussed the limitations of evaluation and management by telemedicine. The patient expressed understanding and agreed to proceed.  Vital Signs: Because this visit was a virtual/telehealth visit, some criteria may be missing or patient reported. Any vitals not documented were not able to be obtained and vitals that have been documented are patient reported.  VideoDeclined- This patient declined Librarian, academic. Therefore the visit was completed with audio only.  Persons Participating in Visit: Patient.  AWV Questionnaire: No: Patient Medicare AWV questionnaire was not completed prior to this visit.  Cardiac Risk Factors include: advanced age (>31men, >63 women);diabetes mellitus;dyslipidemia;hypertension;male gender     Objective:     Today's Vitals   11/28/23 1301  Weight: 175 lb (79.4 kg)  Height: 5\' 11"  (1.803 m)   Body mass index is 24.41 kg/m.     11/24/2022   10:50 AM 03/23/2022   10:03 AM 11/09/2021    9:44 AM 04/06/2021   10:11 AM 03/30/2021   10:50 AM 09/09/2020    1:12 PM 07/23/2020   11:32 AM  Advanced Directives  Does Patient Have a Medical Advance Directive? Yes No Yes Yes Yes Yes Yes  Type of Estate agent of [place];Living will  Healthcare Power of [place];Living will Living will  Healthcare Power of [place];Living will Out of  facility DNR (pink MOST or yellow form)  Does patient want to make changes to medical advance directive? No - Patient declined   No - Patient declined  No - Patient declined No - Patient declined  Copy of Healthcare Power of Attorney in Chart? Yes - validated most recent copy scanned in chart (See row information)  Yes - validated most recent copy scanned in chart (See row information)   Yes - validated most recent copy scanned in chart (See row information)   Would patient like information on creating a medical advance directive?  No - Patient declined       Pre-existing out of facility DNR order (yellow form or pink MOST form)       Pink MOST/Yellow Form most recent copy in chart - Physician notified to receive inpatient order    Current Medications (verified) Outpatient Encounter Medications as of 11/28/2023  Medication Sig   Cholecalciferol (VITAMIN D ) 2000 UNITS CAPS Take 2 capsules by mouth daily.   escitalopram  (LEXAPRO ) 5 MG tablet TAKE ONE TABLET BY MOUTH DAILY   ferrous sulfate  325 (65 FE) MG EC tablet Take 1 tablet (325 mg total) by mouth every other day.   furosemide  (LASIX ) 40 MG tablet Take 1 tablet (40 mg total) by mouth daily. Take 1 tablet by mouth as needed for fluid retention (once weekly on Sundays).   levothyroxine  (SYNTHROID ) 50 MCG tablet Take 1 tablet (50 mcg total) by mouth daily.   polycarbophil (FIBERCON) 625 MG tablet Take 625 mg by mouth daily.   polyethylene glycol (MIRALAX  / GLYCOLAX ) 17 g packet Take 17 g  by mouth 2 (two) times daily. Take 1 to 2 capfuls daily at noon   Probiotic Product (ALIGN) 4 MG CAPS Take 1 capsule (4 mg total) by mouth daily.   rosuvastatin  (CRESTOR ) 40 MG tablet TAKE ONE TABLET BY MOUTH EVERY DAY   traZODone  (DESYREL ) 100 MG tablet TAKE ONE TABLET BY MOUTH AT BEDTIME   HYDROcodone  bit-homatropine (HYCODAN) 5-1.5 MG/5ML syrup Take 5 mLs by mouth every 8 (eight) hours as needed for cough. (Patient not taking: Reported on 11/28/2023)   No  facility-administered encounter medications on file as of 11/28/2023.    Allergies (verified) Sulfa antibiotics and Sulfonamide derivatives   History: Past Medical History:  Diagnosis Date   Anxiety    BRADYCARDIA 11/13/2008   Cataract    Cholelithiasis    COLONIC POLYPS, HX OF 02/04/2007   DEPRESSION 02/04/2007   DIABETES MELLITUS, TYPE II 02/04/2007   DIVERTICULOSIS, COLON 02/04/2007   FOREIGN BODY, ASPIRATION 12/04/2007   GERD (gastroesophageal reflux disease)    GLAUCOMA 03/08/2009   Hepatitis A 1963   HYPERLIPIDEMIA 12/04/2007   HYPERTENSION 02/04/2007   Memory loss    OBSTRUCTIVE SLEEP APNEA 11/05/2009   -PSG 01/05/10 RDI 11, PLMI 56   OSTEOARTHRITIS 02/04/2007   Peptic stricture of esophagus    PROSTATE CANCER 1997   RENAL INSUFFICIENCY 11/13/2008   SYNCOPE 12/31/2009   THROMBOCYTOPENIA 04/17/2008   THYROID  NODULE, RIGHT 11/13/2008   UNSPECIFIED ANEMIA 11/07/2007   Past Surgical History:  Procedure Laterality Date   Heart Cartherization  2000   PROSTATECTOMY  1997   TONSILLECTOMY  1940's   Family History  Problem Relation Age of Onset   Kidney disease Father        Bright's Disease - died at age 49   Other Mother        no signifincant health problems - died at age 47   Heart disease Neg Hx        No FH of Coronary Artery Disease   Cancer Neg Hx    Colon cancer Neg Hx    Esophageal cancer Neg Hx    Inflammatory bowel disease Neg Hx    Liver disease Neg Hx    Pancreatic cancer Neg Hx    Rectal cancer Neg Hx    Stomach cancer Neg Hx    Social History   Socioeconomic History   Marital status: Married    Spouse name: Jeneane Miracle   Number of children: 3   Years of education: college   Highest education level: Bachelor's degree (e.g., BA, AB, BS)  Occupational History   Occupation: Database administrator company--president    Comment: Retired  Tobacco Use   Smoking status: Former    Current packs/day: 0.00    Types: Cigarettes, Pipe    Quit date: 07/08/1993    Years since  quitting: 30.4   Smokeless tobacco: Never  Vaping Use   Vaping status: Never Used  Substance and Sexual Activity   Alcohol use: Yes    Alcohol/week: 1.0 standard drink of alcohol    Types: 1 Shots of liquor per week    Comment: 5 glasses per week   Drug use: No   Sexual activity: Not Currently  Other Topics Concern   Not on file  Social History Narrative   He played tennis 3x a week before moving to [facility].   Right-handed.   2 cups caffeine per day.      Social Drivers of Health   Financial Resource Strain: Low Risk  (11/28/2023)  Overall Financial Resource Strain (CARDIA)    Difficulty of Paying Living Expenses: Not hard at all  Food Insecurity: No Food Insecurity (11/28/2023)   Hunger Vital Sign    Worried About Running Out of Food in the Last Year: Never true    Ran Out of Food in the Last Year: Never true  Transportation Needs: No Transportation Needs (11/28/2023)   PRAPARE - Administrator, Civil Service (Medical): No    Lack of Transportation (Non-Medical): No  Physical Activity: Insufficiently Active (11/28/2023)   Exercise Vital Sign    Days of Exercise per Week: 5 days    Minutes of Exercise per Session: 20 min  Stress: No Stress Concern Present (11/28/2023)   [employer] of Occupational Health - Occupational Stress Questionnaire    Feeling of Stress : Not at all  Social Connections: Socially Integrated (11/28/2023)   Social Connection and Isolation Panel [NHANES]    Frequency of Communication with Friends and Family: More than three times a week    Frequency of Social Gatherings with Friends and Family: More than three times a week    Attends Religious Services: More than 4 times per year    Active Member of [employer] or Organizations: Yes    Attends Engineer, structural: More than 4 times per year    Marital Status: Married    Tobacco Counseling Counseling given: No    Clinical Intake:  Pre-visit preparation completed: Yes  Pain :  No/denies pain     BMI - recorded: 24.41 Nutritional Status: BMI of 19-24  Normal Nutritional Risks: None Diabetes: Yes CBG done?: No Did pt. bring in CBG monitor from home?: No  Lab Results  Component Value Date   HGBA1C 6.0 06/10/2023   HGBA1C 6.0 07/07/2022   HGBA1C 6.4 10/15/2021     How often do you need to have someone help you when you read instructions, pamphlets, or other written materials from your doctor or pharmacy?: 1 - Never  Interpreter Needed?: No  Information entered by :: Kandy Orris, CMA   Activities of Daily Living     11/28/2023    1:04 PM  In your present state of health, do you have any difficulty performing the following activities:  Hearing? 0  Vision? 0  Difficulty concentrating or making decisions? 0  Walking or climbing stairs? 0  Dressing or bathing? 0  Doing errands, shopping? 0  Preparing Food and eating ? N  Using the Toilet? N  In the past six months, have you accidently leaked urine? N  Do you have problems with loss of bowel control? N  Managing your Medications? N  Managing your Finances? N  Housekeeping or managing your Housekeeping? N    Patient Care Team: Colene Dauphin, MD as PCP - General (Internal Medicine) Arnoldo Lapping, MD as PCP - Cardiology (Cardiology) Marlyne Sing, MD (Hematology and Oncology) [place], Amy, MD as Consulting Physician (Dermatology) Dema Filler, MD as Consulting Physician (Ophthalmology) Linard Reno, MD as Consulting Physician (Ophthalmology)  I have updated your Care Teams any recent Medical Services you may have received from other providers in the past year.     Assessment:    This is a routine wellness examination for Clayton.  Hearing/Vision screen Hearing Screening - Comments:: Wears hearing aids Vision Screening - Comments:: Wears rx glasses - up to date with routine eye exams with Dr Dema Filler    Goals Addressed  This Visit's Progress     Patient  Stated (pt-stated)        Patient stated he plans to continue exercising on the treadmill        Depression Screen     11/28/2023    1:07 PM 06/10/2023   10:35 AM 12/09/2022   10:42 AM 11/24/2022   10:54 AM 08/20/2022    8:08 AM 07/21/2022    3:23 PM 07/07/2022    9:29 AM  PHQ 2/9 Scores  PHQ - 2 Score 0 0 0 0 0 0 0  PHQ- 9 Score 3 0 0 0 0 0 0    Fall Risk     11/28/2023    1:06 PM 06/10/2023   10:35 AM 12/09/2022   10:42 AM 11/24/2022   10:53 AM 07/21/2022    3:23 PM  Fall Risk   Falls in the past year? 0 0 0 1 0  Number falls in past yr: 0 0 0 0 0  Injury with Fall? 0 0 0 1 0  Risk for fall due to : No Fall Risks No Fall Risks No Fall Risks History of fall(s);Orthopedic patient No Fall Risks  Follow up Falls evaluation completed;Falls prevention discussed Falls evaluation completed Falls evaluation completed  Falls evaluation completed    MEDICARE RISK AT HOME:  Medicare Risk at Home Any stairs in or around the home?: No If so, are there any without handrails?: No Home free of loose throw rugs in walkways, pet beds, electrical cords, etc?: Yes Adequate lighting in your home to reduce risk of falls?: Yes Life alert?: Yes (wears necklace) Use of a cane, walker or w/c?: Yes (walker) Grab bars in the bathroom?: Yes Shower chair or bench in shower?: Yes Elevated toilet seat or a handicapped toilet?: Yes  TIMED UP AND GO:  Was the test performed?  No  Cognitive Function: 6CIT completed    03/07/2018    2:24 PM  MMSE - Mini Mental State Exam  Orientation to time 5  Orientation to Place 5  Registration 3  Attention/ Calculation 5  Recall 2  Language- name 2 objects 2  Language- repeat 1  Language- follow 3 step command 3  Language- read & follow direction 1  Write a sentence 1  Copy design 1  Total score 29        11/28/2023    1:10 PM 11/24/2022   10:55 AM 11/09/2021    9:47 AM 09/09/2020    1:09 PM 09/06/2019   11:07 AM  6CIT Screen  What Year? 0 points 0  points 0 points 0 points 0 points  What month? 0 points 0 points 0 points 0 points 0 points  What time? 0 points 0 points 0 points 0 points 0 points  Count back from 20 0 points 0 points 0 points 2 points 0 points  Months in reverse 0 points 0 points 0 points 0 points 0 points  Repeat phrase 0 points 0 points 0 points 0 points 0 points  Total Score 0 points 0 points 0 points 2 points 0 points    Immunizations Immunization History  Administered Date(s) Administered   Fluad Quad(high Dose 65+) 02/27/2019, 04/09/2021, 03/30/2022   Fluad Trivalent(High Dose 65+) 04/12/2023   Influenza Split 04/05/2011, 04/04/2012   Influenza Whole 03/27/2008, 04/15/2009, 03/20/2010   Influenza, High Dose Seasonal PF 03/31/2016, 04/06/2017, 03/29/2018, 04/25/2020   Influenza,inj,Quad PF,6+ Mos 04/03/2013, 03/14/2015   Influenza-Unspecified 06/29/1999, 03/28/2000, 03/28/2001, 03/29/2003, 04/28/2004, 05/28/2004, 04/25/2005, 04/27/2005, 04/11/2006, 03/29/2007,  05/10/2007, 02/27/2008, 04/07/2011, 04/19/2012, 04/03/2013, 03/28/2014, 02/27/2015, 03/26/2016, 03/20/2017   Moderna Sars-Covid-2 Vaccination 08/10/2019, 09/06/2019, 05/13/2020   Pneumococcal Conjugate-13 05/06/2014, 03/14/2015   Pneumococcal Polysaccharide-23 06/29/1995   Pneumococcal-Unspecified 08/28/2001   Respiratory Syncytial Virus Vaccine ,Recomb Aduvanted(Arexvy ) 06/14/2023   Td 11/27/1995, 10/26/2004, 11/13/2004   Tdap 05/06/2014, 12/02/2016   Zoster Recombinant(Shingrix) 12/30/2017, 05/22/2018   Zoster, Live 06/28/2004    Screening Tests Health Maintenance  Topic Date Due   FOOT EXAM  05/25/2019   COVID-19 Vaccine (4 - 2024-25 season) 02/27/2023   HEMOGLOBIN A1C  12/09/2023   INFLUENZA VACCINE  01/27/2024   OPHTHALMOLOGY EXAM  09/28/2024   Medicare Annual Wellness (AWV)  11/27/2024   DTaP/Tdap/Td (6 - Td or Tdap) 12/03/2026   Pneumonia Vaccine 6+ Years old  Completed   Zoster Vaccines- Shingrix  Completed   HPV VACCINES  Aged Out    Meningococcal B Vaccine  Aged Out    Health Maintenance  Health Maintenance Due  Topic Date Due   FOOT EXAM  05/25/2019   COVID-19 Vaccine (4 - 2024-25 season) 02/27/2023   Health Maintenance Items Addressed: 11/28/2023   Additional Screening:  Vision Screening: Recommended annual ophthalmology exams for early detection of glaucoma and other disorders of the eye.    Dental Screening: Recommended annual dental exams for proper oral hygiene  Community Resource Referral / Chronic Care Management: CRR required this visit?  No   CCM required this visit?  No   Plan:    I have personally reviewed and noted the following in the patient's chart:   Medical and social history Use of alcohol, tobacco or illicit drugs  Current medications and supplements including opioid prescriptions. Patient is not currently taking opioid prescriptions. Functional ability and status Nutritional status Physical activity Advanced directives List of other physicians Hospitalizations, surgeries, and ER visits in previous 12 months Vitals Screenings to include cognitive, depression, and falls Referrals and appointments  In addition, I have reviewed and discussed with patient certain preventive protocols, quality metrics, and best practice recommendations. A written personalized care plan for preventive services as well as general preventive health recommendations were provided to patient.   Patria Bookbinder, CMA   11/28/2023   After Visit Summary: (MyChart) Due to this being a telephonic visit, the after visit summary with patients personalized plan was offered to patient via MyChart   Notes: Nothing significant to report at this time.

## 2023-12-20 DIAGNOSIS — B351 Tinea unguium: Secondary | ICD-10-CM | POA: Diagnosis not present

## 2023-12-20 DIAGNOSIS — L84 Corns and callosities: Secondary | ICD-10-CM | POA: Diagnosis not present

## 2023-12-20 DIAGNOSIS — E1159 Type 2 diabetes mellitus with other circulatory complications: Secondary | ICD-10-CM | POA: Diagnosis not present

## 2023-12-25 ENCOUNTER — Encounter: Payer: Self-pay | Admitting: Internal Medicine

## 2023-12-25 NOTE — Progress Notes (Unsigned)
      Subjective:    Patient ID: [name], male    DOB: [birth year], 89 y.o.   MRN: [id number]     HPI Pritesh is here for follow up of his chronic medical problems.    Medications and allergies reviewed with patient and updated if appropriate.  Current Outpatient Medications on File Prior to Visit  Medication Sig Dispense Refill   Cholecalciferol (VITAMIN D ) 2000 UNITS CAPS Take 2 capsules by mouth daily.     escitalopram  (LEXAPRO ) 5 MG tablet TAKE ONE TABLET BY MOUTH DAILY 30 tablet 5   ferrous sulfate  325 (65 FE) MG EC tablet Take 1 tablet (325 mg total) by mouth every other day. 15 tablet 3   furosemide  (LASIX ) 40 MG tablet Take 1 tablet (40 mg total) by mouth daily. Take 1 tablet by mouth as needed for fluid retention (once weekly on Sundays). 10 tablet 5   HYDROcodone  bit-homatropine (HYCODAN) 5-1.5 MG/5ML syrup Take 5 mLs by mouth every 8 (eight) hours as needed for cough. (Patient not taking: Reported on 11/28/2023) 120 mL 0   levothyroxine  (SYNTHROID ) 50 MCG tablet Take 1 tablet (50 mcg total) by mouth daily. 90 tablet 1   polycarbophil (FIBERCON) 625 MG tablet Take 625 mg by mouth daily.     polyethylene glycol (MIRALAX  / GLYCOLAX ) 17 g packet Take 17 g by mouth 2 (two) times daily. Take 1 to 2 capfuls daily at noon 14 each 0   Probiotic Product (ALIGN) 4 MG CAPS Take 1 capsule (4 mg total) by mouth daily.     rosuvastatin  (CRESTOR ) 40 MG tablet TAKE ONE TABLET BY MOUTH EVERY DAY 90 tablet 1   traZODone  (DESYREL ) 100 MG tablet TAKE ONE TABLET BY MOUTH AT BEDTIME 90 tablet 3   No current facility-administered medications on file prior to visit.     Review of Systems     Objective:  There were no vitals filed for this visit. BP Readings from Last 3 Encounters:  10/04/23 (!) 142/78  06/30/23 (!) 148/62  06/10/23 130/70   Wt Readings from Last 3 Encounters:  11/28/23 175 lb (79.4 kg)  10/04/23 181 lb (82.1 kg)  06/30/23 185 lb (83.9 kg)   There is no height or  weight on file to calculate BMI.    Physical Exam     Lab Results  Component Value Date   WBC 15.3 (H) 06/10/2023   HGB 12.4 (L) 06/10/2023   HCT 38.4 (L) 06/10/2023   PLT 454.0 (H) 06/10/2023   GLUCOSE 85 06/10/2023   CHOL 120 06/10/2023   TRIG 114.0 06/10/2023   HDL 34.50 (L) 06/10/2023   LDLCALC 63 06/10/2023   ALT 12 06/10/2023   AST 18 06/10/2023   NA 143 06/10/2023   K 4.4 06/10/2023   CL 106 06/10/2023   CREATININE 3.06 (H) 06/10/2023   BUN 64 (H) 06/10/2023   CO2 28 06/10/2023   TSH 4.27 06/10/2023   PSA 0.00 Repeated and verified X2. (L) 07/07/2022   HGBA1C 6.0 06/10/2023   MICROALBUR 5.9 (H) 11/03/2009     Assessment & Plan:    See Problem List for Assessment and Plan of chronic medical problems.

## 2023-12-25 NOTE — Patient Instructions (Addendum)
      Blood work was ordered.       Medications changes include :   None    A referral was ordered and someone will call you to schedule an appointment.     Return in about 6 months (around 06/28/2024) for follow up.

## 2023-12-27 ENCOUNTER — Ambulatory Visit: Payer: Self-pay | Admitting: Internal Medicine

## 2023-12-27 ENCOUNTER — Ambulatory Visit (INDEPENDENT_AMBULATORY_CARE_PROVIDER_SITE_OTHER): Payer: PPO | Admitting: Internal Medicine

## 2023-12-27 ENCOUNTER — Telehealth: Payer: Self-pay

## 2023-12-27 VITALS — BP 140/78 | HR 78 | Temp 98.0°F | Ht 71.0 in | Wt 180.4 lb

## 2023-12-27 DIAGNOSIS — F419 Anxiety disorder, unspecified: Secondary | ICD-10-CM | POA: Diagnosis not present

## 2023-12-27 DIAGNOSIS — I10 Essential (primary) hypertension: Secondary | ICD-10-CM | POA: Diagnosis not present

## 2023-12-27 DIAGNOSIS — E1122 Type 2 diabetes mellitus with diabetic chronic kidney disease: Secondary | ICD-10-CM | POA: Diagnosis not present

## 2023-12-27 DIAGNOSIS — E039 Hypothyroidism, unspecified: Secondary | ICD-10-CM

## 2023-12-27 DIAGNOSIS — E785 Hyperlipidemia, unspecified: Secondary | ICD-10-CM | POA: Diagnosis not present

## 2023-12-27 DIAGNOSIS — R1313 Dysphagia, pharyngeal phase: Secondary | ICD-10-CM

## 2023-12-27 DIAGNOSIS — G4709 Other insomnia: Secondary | ICD-10-CM

## 2023-12-27 DIAGNOSIS — R296 Repeated falls: Secondary | ICD-10-CM | POA: Diagnosis not present

## 2023-12-27 DIAGNOSIS — N184 Chronic kidney disease, stage 4 (severe): Secondary | ICD-10-CM

## 2023-12-27 DIAGNOSIS — R600 Localized edema: Secondary | ICD-10-CM | POA: Diagnosis not present

## 2023-12-27 LAB — COMPREHENSIVE METABOLIC PANEL WITH GFR
ALT: 22 U/L (ref 0–53)
AST: 24 U/L (ref 0–37)
Albumin: 4.3 g/dL (ref 3.5–5.2)
Alkaline Phosphatase: 75 U/L (ref 39–117)
BUN: 75 mg/dL — ABNORMAL HIGH (ref 6–23)
CO2: 27 meq/L (ref 19–32)
Calcium: 9 mg/dL (ref 8.4–10.5)
Chloride: 105 meq/L (ref 96–112)
Creatinine, Ser: 3.43 mg/dL — ABNORMAL HIGH (ref 0.40–1.50)
GFR: 14.7 mL/min — CL (ref >60.00)
Glucose, Bld: 107 mg/dL — ABNORMAL HIGH (ref 70–99)
Potassium: 4.2 meq/L (ref 3.5–5.1)
Sodium: 140 meq/L (ref 135–145)
Total Bilirubin: 0.5 mg/dL (ref 0.2–1.2)
Total Protein: 6.9 g/dL (ref 6.0–8.3)

## 2023-12-27 LAB — LIPID PANEL
Cholesterol: 126 mg/dL (ref 0–200)
HDL: 32.2 mg/dL — ABNORMAL LOW (ref >39.00)
LDL Cholesterol: 62 mg/dL (ref 0–99)
NonHDL: 93.46
Total CHOL/HDL Ratio: 4
Triglycerides: 158 mg/dL — ABNORMAL HIGH (ref 0.0–149.0)
VLDL: 31.6 mg/dL (ref 0.0–40.0)

## 2023-12-27 LAB — CBC WITH DIFFERENTIAL/PLATELET
Basophils Absolute: 0 10*3/uL (ref 0.0–0.1)
Basophils Relative: 0.3 % (ref 0.0–3.0)
Eosinophils Absolute: 0 10*3/uL (ref 0.0–0.7)
Eosinophils Relative: 0 % (ref 0.0–5.0)
HCT: 40.3 % (ref 39.0–52.0)
Hemoglobin: 12.8 g/dL — ABNORMAL LOW (ref 13.0–17.0)
Lymphocytes Relative: 33.1 % (ref 12.0–46.0)
Lymphs Abs: 5.1 10*3/uL — ABNORMAL HIGH (ref 0.7–4.0)
MCHC: 31.7 g/dL (ref 30.0–36.0)
MCV: 97 fl (ref 78.0–100.0)
Monocytes Absolute: 1.6 10*3/uL — ABNORMAL HIGH (ref 0.1–1.0)
Monocytes Relative: 10.1 % (ref 3.0–12.0)
Neutro Abs: 8.7 10*3/uL — ABNORMAL HIGH (ref 1.4–7.7)
Neutrophils Relative %: 56.5 % (ref 43.0–77.0)
Platelets: 499 10*3/uL — ABNORMAL HIGH (ref 150.0–400.0)
RBC: 4.15 Mil/uL — ABNORMAL LOW (ref 4.22–5.81)
RDW: 16.1 % — ABNORMAL HIGH (ref 11.5–15.5)
WBC: 15.4 10*3/uL — ABNORMAL HIGH (ref 4.0–10.5)

## 2023-12-27 LAB — HEMOGLOBIN A1C: Hgb A1c MFr Bld: 6.1 % (ref 4.6–6.5)

## 2023-12-27 LAB — TSH: TSH: 5.12 u[IU]/mL (ref 0.35–5.50)

## 2023-12-27 NOTE — Assessment & Plan Note (Signed)
 Chronic Blood pressure is well-controlled 6 months ago stopped amlodipine  2.5 mg daily due to orthostatic hypotension Cmp, cbc Monitoring BP-nurse at [facility]

## 2023-12-27 NOTE — Assessment & Plan Note (Signed)
 Chronic  Clinically euthyroid Check tsh and will titrate med dose if needed Currently taking levothyroxine 50 mcg daily

## 2023-12-27 NOTE — Assessment & Plan Note (Signed)
 Chronic Had modified barium swallow evaluation Mild pharyngeal dysphagia with possible esophageal component Advised to eat slowly, take small bites, drink water between bites He does not do all this consistently-forgets His wife tries to remind him

## 2023-12-27 NOTE — Assessment & Plan Note (Signed)
 Chronic   Lab Results  Component Value Date   HGBA1C 6.0 06/10/2023   Sugars well controlled Check A1c Continue lifestyle control Stressed regular exercise, diabetic diet

## 2023-12-27 NOTE — Assessment & Plan Note (Addendum)
 Chronic Has established with [facility] CMP, cbc Encouraged increased water intake Takes Lasix  40 mg daily as needed-maybe once a week at most

## 2023-12-27 NOTE — Assessment & Plan Note (Signed)
Chronic Controlled, stable Continue lexapro 5 mg daily

## 2023-12-27 NOTE — Telephone Encounter (Signed)
 Noted

## 2023-12-27 NOTE — Assessment & Plan Note (Signed)
 Chronic Swelling controlled Elevate legs CMP Has Lasix  40 mg that he can take once a week as needed, but has not taken this

## 2023-12-27 NOTE — Assessment & Plan Note (Signed)
Chronic Controlled, Stable Continue trazodone 100 mg at bedtime 

## 2023-12-27 NOTE — Telephone Encounter (Signed)
 CRITICAL VALUE STICKER  CRITICAL VALUE:  GFR 14.70  RECEIVER (on-site recipient of call): Asna Muldrow  DATE & TIME NOTIFIED: 12/27/23 3:47pm  MESSENGER (representative from lab): shenequia   MD NOTIFIED: Dr.Burns

## 2023-12-27 NOTE — Assessment & Plan Note (Signed)
Chronic Regular exercise and healthy diet encouraged Check lipid panel  Continue Crestor 40 mg daily

## 2023-12-27 NOTE — Assessment & Plan Note (Signed)
 Chronic Poor balance and recurrent falls - no recent falls Using a walker  Walks daily on treadmill Discussed fall prevention

## 2024-01-02 ENCOUNTER — Encounter: Payer: Self-pay | Admitting: Internal Medicine

## 2024-01-03 DIAGNOSIS — H35431 Paving stone degeneration of retina, right eye: Secondary | ICD-10-CM | POA: Diagnosis not present

## 2024-01-03 DIAGNOSIS — D3132 Benign neoplasm of left choroid: Secondary | ICD-10-CM | POA: Diagnosis not present

## 2024-01-03 DIAGNOSIS — H43813 Vitreous degeneration, bilateral: Secondary | ICD-10-CM | POA: Diagnosis not present

## 2024-01-03 DIAGNOSIS — H34832 Tributary (branch) retinal vein occlusion, left eye, with macular edema: Secondary | ICD-10-CM | POA: Diagnosis not present

## 2024-01-03 DIAGNOSIS — H353132 Nonexudative age-related macular degeneration, bilateral, intermediate dry stage: Secondary | ICD-10-CM | POA: Diagnosis not present

## 2024-01-06 NOTE — Telephone Encounter (Signed)
 Copied from CRM [id number]. Topic: Medical Record Request - Provider/Facility Request >> Jan 05, 2024  4:16 PM [name] wrote: Reason for CRM: [name] calling from [facility] letting us  know that the lab work [name] was supposed to fax was never received. She was able to provide a good fax number to send the results to: [phone].   [name] would also like a phone call once that is completed: [phone] Ext. [phone]  She did state that the voicemal box is secured and a confirmation could be left here.

## 2024-01-06 NOTE — Telephone Encounter (Signed)
 Spoke with [name] this morning and labs were refaxed again.  She was to call back if they had not been received so that I could refax.  I have not heard back so will assume they had been received.

## 2024-01-13 ENCOUNTER — Telehealth: Payer: Self-pay

## 2024-01-13 NOTE — Telephone Encounter (Signed)
 Copied from CRM [id number]. Topic: General - Other >> Jan 13, 2024  8:43 AM Carlatta H wrote: Reason for CRM: Faxed plan of care from [facility] keeps getting plan of care sent back with it not being signed by Dr Glade Burns//Form was sent back today by [facility] to be signed correctly and sent//

## 2024-01-16 NOTE — Telephone Encounter (Signed)
Waiting for paper work to be faxed.  

## 2024-01-17 DIAGNOSIS — N184 Chronic kidney disease, stage 4 (severe): Secondary | ICD-10-CM | POA: Diagnosis not present

## 2024-01-17 DIAGNOSIS — E1122 Type 2 diabetes mellitus with diabetic chronic kidney disease: Secondary | ICD-10-CM | POA: Diagnosis not present

## 2024-01-17 DIAGNOSIS — I129 Hypertensive chronic kidney disease with stage 1 through stage 4 chronic kidney disease, or unspecified chronic kidney disease: Secondary | ICD-10-CM | POA: Diagnosis not present

## 2024-01-18 NOTE — Telephone Encounter (Signed)
 Form has still not been recieved

## 2024-01-18 NOTE — Telephone Encounter (Signed)
 Form placed in folder for Dr. Geofm to sign

## 2024-01-18 NOTE — Telephone Encounter (Signed)
 Copied from CRM [id number]. Topic: General - Other >> Jan 18, 2024 12:10 PM Jasmin G wrote: Reason for CRM: Mr.Ryan is pt's therapy manager, he called due to some forms being sent over to clinic that have always been returned unsigned, he states that this is a monthly situation and to please make sure that those forms are signed by PCP every time. He will fax those over right now.

## 2024-01-20 NOTE — Telephone Encounter (Signed)
 Form was faxed yesterday and fax conformation received.

## 2024-01-24 DIAGNOSIS — B351 Tinea unguium: Secondary | ICD-10-CM | POA: Diagnosis not present

## 2024-01-24 DIAGNOSIS — E1159 Type 2 diabetes mellitus with other circulatory complications: Secondary | ICD-10-CM | POA: Diagnosis not present

## 2024-01-24 DIAGNOSIS — L84 Corns and callosities: Secondary | ICD-10-CM | POA: Diagnosis not present

## 2024-02-13 ENCOUNTER — Encounter: Payer: Self-pay | Admitting: Internal Medicine

## 2024-02-28 ENCOUNTER — Encounter: Payer: Self-pay | Admitting: Internal Medicine

## 2024-02-28 DIAGNOSIS — E1159 Type 2 diabetes mellitus with other circulatory complications: Secondary | ICD-10-CM | POA: Diagnosis not present

## 2024-02-28 DIAGNOSIS — B351 Tinea unguium: Secondary | ICD-10-CM | POA: Diagnosis not present

## 2024-02-28 DIAGNOSIS — L89892 Pressure ulcer of other site, stage 2: Secondary | ICD-10-CM | POA: Diagnosis not present

## 2024-02-28 NOTE — Progress Notes (Unsigned)
    Subjective:    Patient ID: [name], male    DOB: 1928/04/10, 89 y.o.   MRN: [id number]      HPI [name] is here for No chief complaint on file.     CT of abdomen and pelvis 05/22/2022-no abdominal wall hernia or abnormality.      Medications and allergies reviewed with patient and updated if appropriate.  Current Outpatient Medications on File Prior to Visit  Medication Sig Dispense Refill   Cholecalciferol (VITAMIN D ) 2000 UNITS CAPS Take 2 capsules by mouth daily.     escitalopram  (LEXAPRO ) 5 MG tablet TAKE ONE TABLET BY MOUTH DAILY 30 tablet 5   ferrous sulfate  325 (65 FE) MG EC tablet Take 1 tablet (325 mg total) by mouth every other day. 15 tablet 3   furosemide  (LASIX ) 40 MG tablet Take 1 tablet (40 mg total) by mouth daily. Take 1 tablet by mouth as needed for fluid retention (once weekly on Sundays). 10 tablet 5   levothyroxine  (SYNTHROID ) 50 MCG tablet Take 1 tablet (50 mcg total) by mouth daily. 90 tablet 1   polycarbophil (FIBERCON) 625 MG tablet Take 625 mg by mouth daily.     polyethylene glycol (MIRALAX  / GLYCOLAX ) 17 g packet Take 17 g by mouth 2 (two) times daily. Take 1 to 2 capfuls daily at noon 14 each 0   Probiotic Product (ALIGN) 4 MG CAPS Take 1 capsule (4 mg total) by mouth daily.     rosuvastatin  (CRESTOR ) 40 MG tablet TAKE ONE TABLET BY MOUTH EVERY DAY 90 tablet 1   traZODone  (DESYREL ) 100 MG tablet TAKE ONE TABLET BY MOUTH AT BEDTIME 90 tablet 3   No current facility-administered medications on file prior to visit.    Review of Systems     Objective:  There were no vitals filed for this visit. BP Readings from Last 3 Encounters:  12/27/23 (!) 140/78  10/04/23 (!) 142/78  06/30/23 (!) 148/62   Wt Readings from Last 3 Encounters:  12/27/23 180 lb 6.4 oz (81.8 kg)  11/28/23 175 lb (79.4 kg)  10/04/23 181 lb (82.1 kg)   There is no height or weight on file to calculate BMI.    Physical Exam         Assessment & Plan:     See Problem List for Assessment and Plan of chronic medical problems.

## 2024-02-29 ENCOUNTER — Ambulatory Visit (INDEPENDENT_AMBULATORY_CARE_PROVIDER_SITE_OTHER): Admitting: Internal Medicine

## 2024-02-29 VITALS — BP 146/80 | HR 62 | Temp 98.0°F | Ht 71.0 in | Wt 183.0 lb

## 2024-02-29 DIAGNOSIS — I10 Essential (primary) hypertension: Secondary | ICD-10-CM | POA: Diagnosis not present

## 2024-02-29 DIAGNOSIS — K4090 Unilateral inguinal hernia, without obstruction or gangrene, not specified as recurrent: Secondary | ICD-10-CM

## 2024-02-29 DIAGNOSIS — K5909 Other constipation: Secondary | ICD-10-CM

## 2024-02-29 NOTE — Assessment & Plan Note (Signed)
 New Noticed this 1 week ago Has not had any pain At 66 and with his current medical problems would like to avoid surgery so if there is no pain we will hold off on surgery referral Advised that if he develops any discomfort or pain he needs to let me know so that I can refer him to surgery Avoid straining, heavy lifting He will work on better controlling his constipation because that often is why he has to strain

## 2024-02-29 NOTE — Assessment & Plan Note (Signed)
 Chronic He is taking MiraLAX  daily, but that is not the least effective Start eating prunes daily-stressed he needs to be doing that on a daily basis Avoid straining which could make his hernia worse

## 2024-02-29 NOTE — Patient Instructions (Addendum)
 Medications changes include :   None    Return if symptoms worsen or fail to improve.     Groin Hernia (Inguinal Hernia) in Adults: What to Know  A hernia happens when an organ or tissue in your body pushes out through a weak spot in the muscles of your belly. This makes a bulge. A groin hernia is also called an inguinal hernia. It's found in your groin, which is the area where your leg meets your lower belly. This kind of hernia could also be: In your scrotum, if you're male. In the folds of skin around your vagina, if you're male. You may be able to push the bulge back into your belly. If you can't push it in and blood flow is cut off to the hernia, you'll need surgery right away. What are the causes? A groin hernia may happen when you strain your belly muscles, such as when you: Lift a heavy object. Strain to poop. Cough. What increases the risk? You may be more likely to get a groin hernia if: You're male. You're 50 years or older. You're pregnant. You've had a groin hernia or belly surgery before. You smoke. You're overweight. You work at a job where you need to stand a lot or lift heavy things. What are the signs or symptoms? Symptoms may depend on how big the hernia is. If it's small, you may not have symptoms. If it's bigger, you may have: A bulge near your groin or genitals. Pain or burning in your groin. A dull ache or feeling of pressure in your groin. If blood flow is cut off to the tissues inside the hernia, you may also: Feel pain and tenderness when you touch the bulge. The skin over it may turn red or purple. Have a fever. Throw up or feel like you may throw up. Have trouble pooping or passing gas. How is this treated? Treatment depends on how big the hernia is and what symptoms you have. You may need: To be watched to see if the bulge grows bigger. Surgery. This may be done if the hernia is big or if you have symptoms. Follow these  instructions at home: Lifestyle Ask if it's OK for you to lift. Try not to stand for long periods of time. Do not smoke, vape, or use nicotine or tobacco. Stay at a healthy weight. Try not to do things that put pressure on your hernia. Preventing trouble pooping You may need to take these steps to help prevent or treat trouble pooping (constipation): Take medicines to help you poop. Eat foods high in fiber, like beans, whole grains, and fresh fruits and vegetables. Drink more fluids as told. General instructions Try to push the hernia back in place by very gently pressing on it while lying down. Do not try to force it back in if it won't push in easily. Watch your hernia for any changes in: Shape. Size. Color. Take your medicines only as told. Contact a doctor if: You have a fever. You have new symptoms. Your symptoms get worse. You can't poop or pass gas. Get help right away if: Your bulge: Starts to hurt a lot. Changes color. You have sudden pain in your scrotum, or your scrotum changes size. You can't gently push the hernia back in place. You feel like you may vomit, and that feeling does not go away. You keep throwing up or feeling like you need to throw up. These symptoms may be an  emergency. Call 911 right away. Do not wait to see if the symptoms will go away. Do not drive yourself to the hospital. This information is not intended to replace advice given to you by your health care provider. Make sure you discuss any questions you have with your health care provider. Document Revised: 02/10/2023 Document Reviewed: 02/10/2023 Elsevier Patient Education  2024 [employer].

## 2024-02-29 NOTE — Assessment & Plan Note (Signed)
 Chronic Blood pressure is little higher than ideal, but acceptable for his age Was on amlodipine  at 1 point, but this was stopped secondary to hypotension Monitor BP off medication

## 2024-03-02 DIAGNOSIS — H35431 Paving stone degeneration of retina, right eye: Secondary | ICD-10-CM | POA: Diagnosis not present

## 2024-03-02 DIAGNOSIS — D3132 Benign neoplasm of left choroid: Secondary | ICD-10-CM | POA: Diagnosis not present

## 2024-03-02 DIAGNOSIS — H353132 Nonexudative age-related macular degeneration, bilateral, intermediate dry stage: Secondary | ICD-10-CM | POA: Diagnosis not present

## 2024-03-02 DIAGNOSIS — H34832 Tributary (branch) retinal vein occlusion, left eye, with macular edema: Secondary | ICD-10-CM | POA: Diagnosis not present

## 2024-03-02 DIAGNOSIS — H43813 Vitreous degeneration, bilateral: Secondary | ICD-10-CM | POA: Diagnosis not present

## 2024-03-07 DIAGNOSIS — L570 Actinic keratosis: Secondary | ICD-10-CM | POA: Diagnosis not present

## 2024-03-07 DIAGNOSIS — L720 Epidermal cyst: Secondary | ICD-10-CM | POA: Diagnosis not present

## 2024-03-07 DIAGNOSIS — L821 Other seborrheic keratosis: Secondary | ICD-10-CM | POA: Diagnosis not present

## 2024-03-07 DIAGNOSIS — D692 Other nonthrombocytopenic purpura: Secondary | ICD-10-CM | POA: Diagnosis not present

## 2024-03-07 DIAGNOSIS — Z85828 Personal history of other malignant neoplasm of skin: Secondary | ICD-10-CM | POA: Diagnosis not present

## 2024-03-07 DIAGNOSIS — D225 Melanocytic nevi of trunk: Secondary | ICD-10-CM | POA: Diagnosis not present

## 2024-03-26 ENCOUNTER — Other Ambulatory Visit: Payer: Self-pay | Admitting: Internal Medicine

## 2024-03-28 ENCOUNTER — Telehealth: Payer: Self-pay

## 2024-03-28 ENCOUNTER — Emergency Department (HOSPITAL_BASED_OUTPATIENT_CLINIC_OR_DEPARTMENT_OTHER)

## 2024-03-28 ENCOUNTER — Other Ambulatory Visit: Payer: Self-pay

## 2024-03-28 ENCOUNTER — Emergency Department (HOSPITAL_BASED_OUTPATIENT_CLINIC_OR_DEPARTMENT_OTHER)
Admission: EM | Admit: 2024-03-28 | Discharge: 2024-03-28 | Disposition: A | Discharge status: Home / Self Care | Attending: Emergency Medicine | Admitting: Emergency Medicine

## 2024-03-28 ENCOUNTER — Encounter: Payer: Self-pay | Admitting: Internal Medicine

## 2024-03-28 DIAGNOSIS — D72829 Elevated white blood cell count, unspecified: Secondary | ICD-10-CM | POA: Diagnosis not present

## 2024-03-28 DIAGNOSIS — N189 Chronic kidney disease, unspecified: Secondary | ICD-10-CM | POA: Diagnosis not present

## 2024-03-28 DIAGNOSIS — K4090 Unilateral inguinal hernia, without obstruction or gangrene, not specified as recurrent: Secondary | ICD-10-CM | POA: Insufficient documentation

## 2024-03-28 DIAGNOSIS — K5900 Constipation, unspecified: Secondary | ICD-10-CM | POA: Diagnosis not present

## 2024-03-28 DIAGNOSIS — K5730 Diverticulosis of large intestine without perforation or abscess without bleeding: Secondary | ICD-10-CM | POA: Diagnosis not present

## 2024-03-28 DIAGNOSIS — K429 Umbilical hernia without obstruction or gangrene: Secondary | ICD-10-CM | POA: Insufficient documentation

## 2024-03-28 DIAGNOSIS — K449 Diaphragmatic hernia without obstruction or gangrene: Secondary | ICD-10-CM | POA: Diagnosis not present

## 2024-03-28 LAB — COMPREHENSIVE METABOLIC PANEL WITH GFR
ALT: 25 U/L (ref 0–44)
AST: 29 U/L (ref 15–41)
Albumin: 4 g/dL (ref 3.5–5.0)
Alkaline Phosphatase: 85 U/L (ref 38–126)
Anion gap: 15 (ref 5–15)
BUN: 68 mg/dL — ABNORMAL HIGH (ref 8–23)
CO2: 19 mmol/L — ABNORMAL LOW (ref 22–32)
Calcium: 9 mg/dL (ref 8.9–10.3)
Chloride: 106 mmol/L (ref 98–111)
Creatinine, Ser: 3.54 mg/dL — ABNORMAL HIGH (ref 0.61–1.24)
GFR, Estimated: 15 mL/min — ABNORMAL LOW (ref >60)
Glucose, Bld: 127 mg/dL — ABNORMAL HIGH (ref 70–99)
Potassium: 4.3 mmol/L (ref 3.5–5.1)
Sodium: 140 mmol/L (ref 135–145)
Total Bilirubin: 0.5 mg/dL (ref 0.0–1.2)
Total Protein: 6.6 g/dL (ref 6.5–8.1)

## 2024-03-28 LAB — CBC WITH DIFFERENTIAL/PLATELET
Abs Immature Granulocytes: 0.13 K/uL — ABNORMAL HIGH (ref 0.00–0.07)
Basophils Absolute: 0.1 K/uL (ref 0.0–0.1)
Basophils Relative: 0 %
Eosinophils Absolute: 0 K/uL (ref 0.0–0.5)
Eosinophils Relative: 0 %
HCT: 36.2 % — ABNORMAL LOW (ref 39.0–52.0)
Hemoglobin: 11.6 g/dL — ABNORMAL LOW (ref 13.0–17.0)
Immature Granulocytes: 1 %
Lymphocytes Relative: 23 %
Lymphs Abs: 4.6 K/uL — ABNORMAL HIGH (ref 0.7–4.0)
MCH: 31.6 pg (ref 26.0–34.0)
MCHC: 32 g/dL (ref 30.0–36.0)
MCV: 98.6 fL (ref 80.0–100.0)
Monocytes Absolute: 2 K/uL — ABNORMAL HIGH (ref 0.1–1.0)
Monocytes Relative: 10 %
Neutro Abs: 13 K/uL — ABNORMAL HIGH (ref 1.7–7.7)
Neutrophils Relative %: 66 %
Platelets: 425 K/uL — ABNORMAL HIGH (ref 150–400)
RBC: 3.67 MIL/uL — ABNORMAL LOW (ref 4.22–5.81)
RDW: 14.5 % (ref 11.5–15.5)
WBC: 19.8 K/uL — ABNORMAL HIGH (ref 4.0–10.5)
nRBC: 0 % (ref 0.0–0.2)

## 2024-03-28 MED ORDER — LACTATED RINGERS IV BOLUS
500.0000 mL | Freq: Once | INTRAVENOUS | Status: AC
  Administered 2024-03-28: 500 mL via INTRAVENOUS

## 2024-03-28 NOTE — ED Notes (Signed)
 Dc paperwork given and verbally understood.Gerald AasAaron Richardson

## 2024-03-28 NOTE — ED Provider Notes (Signed)
 [name] EMERGENCY DEPARTMENT AT [facility] - Inpatient Provider Note   CSN: [id number] Arrival date & time: 03/28/24  9072     Patient presents with: Constipation   [name] is a 89 y.o. male.   HPI 89 year old male presents with constipation.  History is from patient and wife.  He has a history of chronic constipation and is on MiraLAX  daily which typically helps him go.  However he has not had a bowel movement for about 3 days.  He will try to go to the bathroom but nothing will happen.  He does have some flatulence.  No vomiting, fever, abdominal pain.  However he states this is similar to a few years ago when he was diagnosed with diverticulitis.  No new urinary symptoms.  Prior to Admission medications   Medication Sig Start Date End Date Taking? Authorizing Provider  Cholecalciferol (VITAMIN D ) 2000 UNITS CAPS Take 2 capsules by mouth daily.    [provider]  escitalopram  (LEXAPRO ) 5 MG tablet TAKE ONE TABLET BY MOUTH DAILY 05/30/23   Geofm Glade PARAS, MD  ferrous sulfate  325 (65 FE) MG EC tablet Take 1 tablet (325 mg total) by mouth every other day. 11/14/19   Reed, Tiffany L, DO  furosemide  (LASIX ) 40 MG tablet Take 1 tablet (40 mg total) by mouth daily. Take 1 tablet by mouth as needed for fluid retention (once weekly on Sundays). 07/07/22   Geofm Glade PARAS, MD  levothyroxine  (SYNTHROID ) 50 MCG tablet Take 1 tablet (50 mcg total) by mouth daily. 03/26/24   Geofm Glade PARAS, MD  polycarbophil (FIBERCON) 625 MG tablet Take 625 mg by mouth daily.    [provider]  polyethylene glycol (MIRALAX  / GLYCOLAX ) 17 g packet Take 17 g by mouth 2 (two) times daily. Take 1 to 2 capfuls daily at noon 03/27/22   Sebastian Toribio GAILS, MD  Probiotic Product (ALIGN) 4 MG CAPS Take 1 capsule (4 mg total) by mouth daily. 03/27/22   Sebastian Toribio GAILS, MD  rosuvastatin  (CRESTOR ) 40 MG tablet TAKE ONE TABLET BY MOUTH EVERY DAY 03/26/24   Geofm Glade PARAS, MD  traZODone  (DESYREL ) 100 MG  tablet TAKE ONE TABLET BY MOUTH AT BEDTIME 05/16/23   Geofm Glade PARAS, MD    Allergies: Sulfa antibiotics and Sulfonamide derivatives    Review of Systems  Constitutional:  Negative for fever.  Gastrointestinal:  Positive for constipation. Negative for abdominal pain, rectal pain and vomiting.  Genitourinary:  Negative for dysuria.    Updated Vital Signs BP (!) 151/69 (BP Location: Right Arm)   Pulse 71   Temp 98.2 F (36.8 C) (Oral)   Resp 16   SpO2 96%   Physical Exam Vitals and nursing note reviewed.  Constitutional:      Appearance: He is well-developed.  HENT:     Head: Normocephalic and atraumatic.  Pulmonary:     Effort: Pulmonary effort is normal.  Abdominal:     Palpations: Abdomen is soft.     Tenderness: There is no abdominal tenderness.     Hernia: A hernia is present. Hernia is present in the umbilical area and right inguinal area.     Comments: Umbilical and right inguinal hernias are easily reducible.  Skin:    General: Skin is warm and dry.  Neurological:     Mental Status: He is alert.     (all labs ordered are listed, but only abnormal results are displayed) Labs Reviewed  COMPREHENSIVE METABOLIC PANEL WITH GFR -  Abnormal; Notable for the following components:      Result Value   CO2 19 (*)    Glucose, Bld 127 (*)    BUN 68 (*)    Creatinine, Ser 3.54 (*)    GFR, Estimated 15 (*)    All other components within normal limits  CBC WITH DIFFERENTIAL/PLATELET - Abnormal; Notable for the following components:   WBC 19.8 (*)    RBC 3.67 (*)    Hemoglobin 11.6 (*)    HCT 36.2 (*)    Platelets 425 (*)    Neutro Abs 13.0 (*)    Lymphs Abs 4.6 (*)    Monocytes Absolute 2.0 (*)    Abs Immature Granulocytes 0.13 (*)    All other components within normal limits    EKG: None  Radiology: CT ABDOMEN PELVIS WO CONTRAST Result Date: 03/28/2024 CLINICAL DATA:  Constipation and nausea. EXAM: CT ABDOMEN AND PELVIS WITHOUT CONTRAST TECHNIQUE:  Multidetector CT imaging of the abdomen and pelvis was performed following the standard protocol without IV contrast. RADIATION DOSE REDUCTION: This exam was performed according to the departmental dose-optimization program which includes automated exposure control, adjustment of the mA and/or kV according to patient size and/or use of iterative reconstruction technique. COMPARISON:  05/22/2022 FINDINGS: Lower chest: Mild bibasilar atelectasis. Stable to slightly larger moderate hiatal hernia. Hepatobiliary: No focal liver abnormality is seen. No gallstones, gallbladder wall thickening, or biliary dilatation. Pancreas: Unremarkable. No pancreatic ductal dilatation or surrounding inflammatory changes. Spleen: Normal in size without focal abnormality. Adrenals/Urinary Tract: Stable atrophic kidneys bilaterally without hydronephrosis or renal calculi. Stable small rounded hyperdensity in the posterior mid cortex of the right kidney measuring approximately 6 mm. This may represent a small hyperdense/hemorrhagic cyst and is stable since 2023. The bladder is unremarkable. Stomach/Bowel: No bowel obstruction or significant ileus. There is a segment of irregular thickening involving the proximal sigmoid colon that may be on the basis of chronic diverticular disease. Underlying sigmoid colonic lesion is not excluded. Stable diverticulosis of much of the colon without evidence of acute diverticulitis. No fecal impaction. The appendix is normal. No free intraperitoneal air. Vascular/Lymphatic: Stable aortic atherosclerosis without aneurysm. No lymphadenopathy identified. Reproductive: Stable appearance status post prior prostatectomy. Other: Right inguinal hernia again noted containing a loop of distal small bowel without evidence of incarceration or strangulation. No ascites or abscess identified. Musculoskeletal: Stable degenerative disc disease of the lumbar spine. IMPRESSION: 1. No acute findings in the abdomen or pelvis.  2. Stable to slightly larger moderate hiatal hernia. 3. Stable atrophic kidneys bilaterally without hydronephrosis or renal calculi. 4. Stable 6 mm hyperdensity in the posterior mid cortex of the right kidney. This may represent a small hyperdense/hemorrhagic cyst and is stable since 2023. 5. Stable diverticulosis of much of the colon without evidence of acute diverticulitis. 6. Stable right inguinal hernia containing a loop of distal small bowel without evidence of incarceration or strangulation. 7. Segment of irregular thickening involving the proximal sigmoid colon that may be on the basis of chronic diverticular disease. Underlying sigmoid colonic lesion is not excluded. Correlation with elective colonoscopy, if indicated, may be helpful. 8. Aortic atherosclerosis. Electronically Signed   By: Marcey Moan M.D.   On: 03/28/2024 10:35     Procedures   Medications Ordered in the ED  lactated ringers  bolus 500 mL (0 mLs Intravenous Stopped 03/28/24 1211)  Medical Decision Making Amount and/or Complexity of Data Reviewed Labs: ordered.    Details: CKD at baseline.  Leukocytosis a little higher than baseline but seems to have a chronic leukocytosis Radiology: ordered and independent interpretation performed.    Details: No obstruction   Patient presents with constipation.  CT does not show any evidence of obstruction, diverticulitis, etc.  I did discuss this sigmoid thickening and cannot rule out a lesion and may need colonoscopy but he does not want to consider this at his age.  Otherwise he is asking for an enema.  Enema was given and he had a large amount of bowel movement and feels better and feels ready for discharge.  His CKD is baseline.  He has a leukocytosis but no clear sign of an infection and declines any urine testing and denies any urine symptoms.  Seems like the leukocytosis is relatively chronic based on chart review.  I do not think an acute  infection is going on.  He feels well enough for discharge.  Will give return precautions.     Final diagnoses:  Constipation, unspecified constipation type    ED Discharge Orders     None          Freddi Hamilton, MD 03/28/24 1420

## 2024-03-28 NOTE — ED Triage Notes (Signed)
 C/o constipation x 2 days. Hx of diverticulitis. Denies abd pain.

## 2024-03-28 NOTE — Telephone Encounter (Signed)
 Copied from CRM #[id number]. Topic: General - Other >> Mar 28, 2024  1:13 PM Deaijah H wrote: Reason for CRM: Patients daughter would like to speak with Dr. Geofm nurse Tobias regarding father's constipation. Please call [phone].

## 2024-03-28 NOTE — Telephone Encounter (Signed)
 Patient seen in ED today and was given an enema today for constipation.

## 2024-03-28 NOTE — ED Notes (Addendum)
 Enema performed... RN assisted... Provider informed... Pt states he feels better after the enema.SABRASABRA

## 2024-03-28 NOTE — Discharge Instructions (Signed)
 Be sure to increase your fluid intake and increase your fiber intake, especially by fruits and vegetables.  Follow-up with your primary care provider.  If you develop abdominal pain, fever, vomiting, or any other new/concerning symptoms then return to the ER.

## 2024-04-11 ENCOUNTER — Other Ambulatory Visit (HOSPITAL_BASED_OUTPATIENT_CLINIC_OR_DEPARTMENT_OTHER): Payer: Self-pay

## 2024-04-11 MED ORDER — FLUZONE HIGH-DOSE 0.5 ML IM SUSY
0.5000 mL | PREFILLED_SYRINGE | Freq: Once | INTRAMUSCULAR | 0 refills | Status: AC
  Filled 2024-04-11: qty 0.5, 1d supply, fill #0

## 2024-04-27 DIAGNOSIS — H43813 Vitreous degeneration, bilateral: Secondary | ICD-10-CM | POA: Diagnosis not present

## 2024-04-27 DIAGNOSIS — D3132 Benign neoplasm of left choroid: Secondary | ICD-10-CM | POA: Diagnosis not present

## 2024-04-27 DIAGNOSIS — H353132 Nonexudative age-related macular degeneration, bilateral, intermediate dry stage: Secondary | ICD-10-CM | POA: Diagnosis not present

## 2024-04-27 DIAGNOSIS — H35431 Paving stone degeneration of retina, right eye: Secondary | ICD-10-CM | POA: Diagnosis not present

## 2024-04-27 DIAGNOSIS — H34832 Tributary (branch) retinal vein occlusion, left eye, with macular edema: Secondary | ICD-10-CM | POA: Diagnosis not present

## 2024-05-01 DIAGNOSIS — H9193 Unspecified hearing loss, bilateral: Secondary | ICD-10-CM | POA: Diagnosis not present

## 2024-05-01 DIAGNOSIS — E039 Hypothyroidism, unspecified: Secondary | ICD-10-CM | POA: Diagnosis not present

## 2024-05-01 DIAGNOSIS — F3342 Major depressive disorder, recurrent, in full remission: Secondary | ICD-10-CM | POA: Diagnosis not present

## 2024-05-01 DIAGNOSIS — K5900 Constipation, unspecified: Secondary | ICD-10-CM | POA: Diagnosis not present

## 2024-05-01 DIAGNOSIS — E785 Hyperlipidemia, unspecified: Secondary | ICD-10-CM | POA: Diagnosis not present

## 2024-05-01 DIAGNOSIS — G4700 Insomnia, unspecified: Secondary | ICD-10-CM | POA: Diagnosis not present

## 2024-05-08 ENCOUNTER — Other Ambulatory Visit: Payer: Self-pay | Admitting: Internal Medicine

## 2024-05-08 DIAGNOSIS — N184 Chronic kidney disease, stage 4 (severe): Secondary | ICD-10-CM | POA: Diagnosis not present

## 2024-05-08 DIAGNOSIS — B351 Tinea unguium: Secondary | ICD-10-CM | POA: Diagnosis not present

## 2024-05-08 DIAGNOSIS — F320 Major depressive disorder, single episode, mild: Secondary | ICD-10-CM

## 2024-05-08 DIAGNOSIS — L89892 Pressure ulcer of other site, stage 2: Secondary | ICD-10-CM | POA: Diagnosis not present

## 2024-05-08 DIAGNOSIS — E1159 Type 2 diabetes mellitus with other circulatory complications: Secondary | ICD-10-CM | POA: Diagnosis not present

## 2024-05-15 DIAGNOSIS — D72829 Elevated white blood cell count, unspecified: Secondary | ICD-10-CM | POA: Diagnosis not present

## 2024-05-15 DIAGNOSIS — E1122 Type 2 diabetes mellitus with diabetic chronic kidney disease: Secondary | ICD-10-CM | POA: Diagnosis not present

## 2024-05-15 DIAGNOSIS — E875 Hyperkalemia: Secondary | ICD-10-CM | POA: Diagnosis not present

## 2024-05-15 DIAGNOSIS — I120 Hypertensive chronic kidney disease with stage 5 chronic kidney disease or end stage renal disease: Secondary | ICD-10-CM | POA: Diagnosis not present

## 2024-05-15 DIAGNOSIS — N185 Chronic kidney disease, stage 5: Secondary | ICD-10-CM | POA: Diagnosis not present

## 2024-06-29 ENCOUNTER — Telehealth: Payer: Self-pay | Admitting: Cardiovascular Disease

## 2024-06-29 NOTE — Telephone Encounter (Signed)
 Patient states he was only calling to schedule his F/U appt which he has with APP 08/02/24.   He denies any issues with his BP, readings are have been WNL for him. No further needs voiced, patient expressed appreciation for call.

## 2024-06-29 NOTE — Telephone Encounter (Signed)
 Pt c/o BP issue: STAT if pt c/o blurred vision, one-sided weakness or slurred speech.  STAT if BP is GREATER than 180/120 TODAY.  STAT if BP is LESS than 90/60 and SYMPTOMATIC TODAY  1. What is your BP concern? 144/70  2. Have you taken any BP medication today? Yes   3. What are your last 5 BP readings?   144/70 138/60 148/64   4. Are you having any other symptoms (ex. Dizziness, headache, blurred vision, passed out)? No   Please advise.

## 2024-08-02 ENCOUNTER — Ambulatory Visit: Admitting: Emergency Medicine

## 2024-08-23 ENCOUNTER — Ambulatory Visit: Admitting: Cardiology

## 2024-11-29 ENCOUNTER — Ambulatory Visit
